# Patient Record
Sex: Female | Born: 1970
Health system: Southern US, Community
[De-identification: ages and names within clinical notes are randomized; demographics above are authoritative.]

## PROBLEM LIST (undated history)

## (undated) DIAGNOSIS — N189 Chronic kidney disease, unspecified: Secondary | ICD-10-CM

## (undated) DIAGNOSIS — Z87410 Personal history of cervical dysplasia: Secondary | ICD-10-CM

## (undated) DIAGNOSIS — J189 Pneumonia, unspecified organism: Secondary | ICD-10-CM

## (undated) DIAGNOSIS — E282 Polycystic ovarian syndrome: Secondary | ICD-10-CM

## (undated) DIAGNOSIS — M1990 Unspecified osteoarthritis, unspecified site: Secondary | ICD-10-CM

## (undated) DIAGNOSIS — M797 Fibromyalgia: Secondary | ICD-10-CM

## (undated) DIAGNOSIS — E785 Hyperlipidemia, unspecified: Secondary | ICD-10-CM

## (undated) DIAGNOSIS — F419 Anxiety disorder, unspecified: Secondary | ICD-10-CM

## (undated) DIAGNOSIS — G43909 Migraine, unspecified, not intractable, without status migrainosus: Secondary | ICD-10-CM

## (undated) DIAGNOSIS — I10 Essential (primary) hypertension: Secondary | ICD-10-CM

## (undated) DIAGNOSIS — Z87898 Personal history of other specified conditions: Secondary | ICD-10-CM

## (undated) DIAGNOSIS — R21 Rash and other nonspecific skin eruption: Secondary | ICD-10-CM

## (undated) DIAGNOSIS — F32A Depression, unspecified: Secondary | ICD-10-CM

## (undated) DIAGNOSIS — K219 Gastro-esophageal reflux disease without esophagitis: Secondary | ICD-10-CM

## (undated) DIAGNOSIS — R000 Tachycardia, unspecified: Secondary | ICD-10-CM

## (undated) DIAGNOSIS — F319 Bipolar disorder, unspecified: Secondary | ICD-10-CM

## (undated) DIAGNOSIS — G4730 Sleep apnea, unspecified: Secondary | ICD-10-CM

## (undated) DIAGNOSIS — N201 Calculus of ureter: Secondary | ICD-10-CM

## (undated) DIAGNOSIS — F329 Major depressive disorder, single episode, unspecified: Secondary | ICD-10-CM

## (undated) DIAGNOSIS — Z87442 Personal history of urinary calculi: Secondary | ICD-10-CM

## (undated) HISTORY — DX: Hyperlipidemia, unspecified: E78.5

## (undated) HISTORY — DX: Migraine, unspecified, not intractable, without status migrainosus: G43.909

## (undated) HISTORY — PX: BREAST EXCISIONAL BIOPSY: SUR124

## (undated) HISTORY — DX: Depression, unspecified: F32.A

## (undated) HISTORY — DX: Major depressive disorder, single episode, unspecified: F32.9

## (undated) HISTORY — DX: Gastro-esophageal reflux disease without esophagitis: K21.9

## (undated) HISTORY — PX: WISDOM TOOTH EXTRACTION: SHX21

## (undated) HISTORY — PX: COLONOSCOPY: SHX174

## (undated) HISTORY — DX: Polycystic ovarian syndrome: E28.2

---

## 1998-12-02 ENCOUNTER — Other Ambulatory Visit: Admission: RE | Admit: 1998-12-02 | Discharge: 1998-12-02 | Payer: Self-pay | Admitting: Obstetrics and Gynecology

## 1999-07-20 ENCOUNTER — Ambulatory Visit (HOSPITAL_COMMUNITY): Admission: RE | Admit: 1999-07-20 | Discharge: 1999-07-20 | Payer: Self-pay | Admitting: Obstetrics and Gynecology

## 1999-09-18 ENCOUNTER — Emergency Department (HOSPITAL_COMMUNITY): Admission: EM | Admit: 1999-09-18 | Discharge: 1999-09-18 | Payer: Self-pay | Admitting: Emergency Medicine

## 1999-09-27 ENCOUNTER — Ambulatory Visit (HOSPITAL_BASED_OUTPATIENT_CLINIC_OR_DEPARTMENT_OTHER): Admission: RE | Admit: 1999-09-27 | Discharge: 1999-09-27 | Payer: Self-pay | Admitting: Orthopedic Surgery

## 1999-09-27 HISTORY — PX: OTHER SURGICAL HISTORY: SHX169

## 2000-02-01 ENCOUNTER — Other Ambulatory Visit: Admission: RE | Admit: 2000-02-01 | Discharge: 2000-02-16 | Payer: Self-pay

## 2000-09-12 ENCOUNTER — Ambulatory Visit (HOSPITAL_COMMUNITY): Admission: RE | Admit: 2000-09-12 | Discharge: 2000-09-12 | Payer: Self-pay | Admitting: Obstetrics and Gynecology

## 2000-09-12 ENCOUNTER — Encounter: Payer: Self-pay | Admitting: Obstetrics and Gynecology

## 2001-02-07 ENCOUNTER — Other Ambulatory Visit: Admission: RE | Admit: 2001-02-07 | Discharge: 2001-02-07 | Payer: Self-pay | Admitting: Obstetrics and Gynecology

## 2001-11-28 ENCOUNTER — Encounter: Payer: Self-pay | Admitting: Internal Medicine

## 2001-11-28 ENCOUNTER — Encounter: Admission: RE | Admit: 2001-11-28 | Discharge: 2001-11-28 | Payer: Self-pay | Admitting: Internal Medicine

## 2001-11-29 ENCOUNTER — Encounter: Payer: Self-pay | Admitting: Internal Medicine

## 2001-11-29 ENCOUNTER — Encounter: Admission: RE | Admit: 2001-11-29 | Discharge: 2001-11-29 | Payer: Self-pay | Admitting: Internal Medicine

## 2002-05-20 ENCOUNTER — Other Ambulatory Visit: Admission: RE | Admit: 2002-05-20 | Discharge: 2002-05-20 | Payer: Self-pay | Admitting: Obstetrics and Gynecology

## 2002-06-25 ENCOUNTER — Encounter (INDEPENDENT_AMBULATORY_CARE_PROVIDER_SITE_OTHER): Payer: Self-pay | Admitting: *Deleted

## 2002-06-25 ENCOUNTER — Ambulatory Visit (HOSPITAL_COMMUNITY): Admission: RE | Admit: 2002-06-25 | Discharge: 2002-06-25 | Payer: Self-pay | Admitting: Obstetrics and Gynecology

## 2002-06-25 HISTORY — PX: OTHER SURGICAL HISTORY: SHX169

## 2002-09-24 ENCOUNTER — Other Ambulatory Visit: Admission: RE | Admit: 2002-09-24 | Discharge: 2002-09-24 | Payer: Self-pay | Admitting: Obstetrics and Gynecology

## 2002-10-01 ENCOUNTER — Encounter: Payer: Self-pay | Admitting: Obstetrics and Gynecology

## 2002-10-01 ENCOUNTER — Encounter: Admission: RE | Admit: 2002-10-01 | Discharge: 2002-10-01 | Payer: Self-pay | Admitting: Obstetrics and Gynecology

## 2002-10-15 ENCOUNTER — Encounter (INDEPENDENT_AMBULATORY_CARE_PROVIDER_SITE_OTHER): Payer: Self-pay | Admitting: *Deleted

## 2002-10-15 ENCOUNTER — Ambulatory Visit (HOSPITAL_BASED_OUTPATIENT_CLINIC_OR_DEPARTMENT_OTHER): Admission: RE | Admit: 2002-10-15 | Discharge: 2002-10-15 | Payer: Self-pay | Admitting: Surgery

## 2002-12-24 ENCOUNTER — Other Ambulatory Visit: Admission: RE | Admit: 2002-12-24 | Discharge: 2002-12-24 | Payer: Self-pay | Admitting: Obstetrics and Gynecology

## 2002-12-29 ENCOUNTER — Ambulatory Visit (HOSPITAL_COMMUNITY): Admission: RE | Admit: 2002-12-29 | Discharge: 2002-12-29 | Payer: Self-pay | Admitting: Oral & Maxillofacial Surgery

## 2002-12-29 ENCOUNTER — Encounter: Payer: Self-pay | Admitting: Oral & Maxillofacial Surgery

## 2003-03-25 ENCOUNTER — Other Ambulatory Visit: Admission: RE | Admit: 2003-03-25 | Discharge: 2003-03-25 | Payer: Self-pay | Admitting: Obstetrics and Gynecology

## 2003-09-30 ENCOUNTER — Other Ambulatory Visit: Admission: RE | Admit: 2003-09-30 | Discharge: 2003-09-30 | Payer: Self-pay | Admitting: Obstetrics and Gynecology

## 2004-03-09 ENCOUNTER — Encounter: Admission: RE | Admit: 2004-03-09 | Discharge: 2004-04-12 | Payer: Self-pay | Admitting: Internal Medicine

## 2004-11-23 ENCOUNTER — Other Ambulatory Visit: Admission: RE | Admit: 2004-11-23 | Discharge: 2004-11-23 | Payer: Self-pay | Admitting: Obstetrics and Gynecology

## 2005-04-14 ENCOUNTER — Emergency Department (HOSPITAL_COMMUNITY): Admission: EM | Admit: 2005-04-14 | Discharge: 2005-04-14 | Payer: Self-pay | Admitting: Emergency Medicine

## 2005-04-24 ENCOUNTER — Ambulatory Visit: Payer: Self-pay | Admitting: Internal Medicine

## 2005-04-25 ENCOUNTER — Encounter (INDEPENDENT_AMBULATORY_CARE_PROVIDER_SITE_OTHER): Payer: Self-pay | Admitting: *Deleted

## 2005-04-25 ENCOUNTER — Ambulatory Visit: Payer: Self-pay | Admitting: Internal Medicine

## 2005-04-25 HISTORY — PX: ESOPHAGOGASTRODUODENOSCOPY: SHX1529

## 2005-04-26 ENCOUNTER — Ambulatory Visit: Payer: Self-pay | Admitting: Internal Medicine

## 2005-05-10 ENCOUNTER — Ambulatory Visit: Payer: Self-pay | Admitting: Internal Medicine

## 2005-10-11 ENCOUNTER — Encounter: Admission: RE | Admit: 2005-10-11 | Discharge: 2005-10-11 | Payer: Self-pay | Admitting: Obstetrics and Gynecology

## 2005-10-13 ENCOUNTER — Ambulatory Visit: Payer: Self-pay | Admitting: Internal Medicine

## 2005-10-19 ENCOUNTER — Ambulatory Visit: Payer: Self-pay | Admitting: Internal Medicine

## 2005-10-27 ENCOUNTER — Ambulatory Visit: Payer: Self-pay | Admitting: Internal Medicine

## 2005-12-22 ENCOUNTER — Ambulatory Visit: Payer: Self-pay | Admitting: Internal Medicine

## 2006-01-19 ENCOUNTER — Ambulatory Visit: Payer: Self-pay | Admitting: Internal Medicine

## 2006-02-19 ENCOUNTER — Ambulatory Visit: Payer: Self-pay | Admitting: Internal Medicine

## 2006-03-28 ENCOUNTER — Ambulatory Visit: Payer: Self-pay | Admitting: Internal Medicine

## 2006-08-03 ENCOUNTER — Other Ambulatory Visit: Admission: RE | Admit: 2006-08-03 | Discharge: 2006-08-03 | Payer: Self-pay | Admitting: Obstetrics and Gynecology

## 2006-11-08 ENCOUNTER — Ambulatory Visit: Payer: Self-pay | Admitting: Internal Medicine

## 2006-11-08 ENCOUNTER — Encounter: Payer: Self-pay | Admitting: Internal Medicine

## 2006-12-07 ENCOUNTER — Ambulatory Visit: Payer: Self-pay | Admitting: Internal Medicine

## 2007-01-22 ENCOUNTER — Ambulatory Visit: Payer: Self-pay | Admitting: Endocrinology

## 2007-01-23 ENCOUNTER — Ambulatory Visit: Payer: Self-pay

## 2007-03-26 ENCOUNTER — Ambulatory Visit (HOSPITAL_COMMUNITY): Admission: RE | Admit: 2007-03-26 | Discharge: 2007-03-27 | Payer: Self-pay | Admitting: Obstetrics and Gynecology

## 2007-03-29 ENCOUNTER — Other Ambulatory Visit: Admission: RE | Admit: 2007-03-29 | Discharge: 2007-03-29 | Payer: Self-pay | Admitting: Obstetrics and Gynecology

## 2007-06-24 ENCOUNTER — Ambulatory Visit (HOSPITAL_COMMUNITY): Admission: RE | Admit: 2007-06-24 | Discharge: 2007-06-24 | Payer: Self-pay | Admitting: Obstetrics and Gynecology

## 2007-06-26 ENCOUNTER — Ambulatory Visit (HOSPITAL_COMMUNITY): Admission: RE | Admit: 2007-06-26 | Discharge: 2007-06-26 | Payer: Self-pay | Admitting: Obstetrics and Gynecology

## 2007-07-15 ENCOUNTER — Ambulatory Visit (HOSPITAL_COMMUNITY): Admission: RE | Admit: 2007-07-15 | Discharge: 2007-07-15 | Payer: Self-pay | Admitting: Obstetrics and Gynecology

## 2007-07-24 ENCOUNTER — Encounter: Payer: Self-pay | Admitting: Obstetrics and Gynecology

## 2007-07-24 ENCOUNTER — Inpatient Hospital Stay (HOSPITAL_COMMUNITY): Admission: AD | Admit: 2007-07-24 | Discharge: 2007-07-24 | Payer: Self-pay | Admitting: Obstetrics and Gynecology

## 2007-08-01 ENCOUNTER — Inpatient Hospital Stay (HOSPITAL_COMMUNITY): Admission: AD | Admit: 2007-08-01 | Discharge: 2007-08-01 | Payer: Self-pay | Admitting: Obstetrics and Gynecology

## 2007-08-01 ENCOUNTER — Encounter: Payer: Self-pay | Admitting: Obstetrics and Gynecology

## 2007-08-08 ENCOUNTER — Ambulatory Visit (HOSPITAL_COMMUNITY): Admission: RE | Admit: 2007-08-08 | Discharge: 2007-08-08 | Payer: Self-pay | Admitting: Obstetrics and Gynecology

## 2007-08-14 ENCOUNTER — Encounter: Payer: Self-pay | Admitting: *Deleted

## 2007-08-14 DIAGNOSIS — F3289 Other specified depressive episodes: Secondary | ICD-10-CM | POA: Insufficient documentation

## 2007-08-14 DIAGNOSIS — E282 Polycystic ovarian syndrome: Secondary | ICD-10-CM | POA: Insufficient documentation

## 2007-08-14 DIAGNOSIS — K219 Gastro-esophageal reflux disease without esophagitis: Secondary | ICD-10-CM | POA: Insufficient documentation

## 2007-08-14 DIAGNOSIS — A630 Anogenital (venereal) warts: Secondary | ICD-10-CM | POA: Insufficient documentation

## 2007-08-14 DIAGNOSIS — Z9889 Other specified postprocedural states: Secondary | ICD-10-CM | POA: Insufficient documentation

## 2007-08-14 DIAGNOSIS — Z87410 Personal history of cervical dysplasia: Secondary | ICD-10-CM | POA: Insufficient documentation

## 2007-08-14 DIAGNOSIS — F329 Major depressive disorder, single episode, unspecified: Secondary | ICD-10-CM | POA: Insufficient documentation

## 2007-08-15 ENCOUNTER — Ambulatory Visit (HOSPITAL_COMMUNITY): Admission: RE | Admit: 2007-08-15 | Discharge: 2007-08-15 | Payer: Self-pay | Admitting: Obstetrics and Gynecology

## 2007-08-22 ENCOUNTER — Ambulatory Visit (HOSPITAL_COMMUNITY): Admission: RE | Admit: 2007-08-22 | Discharge: 2007-08-22 | Payer: Self-pay | Admitting: Obstetrics and Gynecology

## 2007-08-23 ENCOUNTER — Ambulatory Visit (HOSPITAL_COMMUNITY): Admission: RE | Admit: 2007-08-23 | Discharge: 2007-08-23 | Payer: Self-pay | Admitting: Obstetrics and Gynecology

## 2007-12-09 ENCOUNTER — Ambulatory Visit: Payer: Self-pay | Admitting: Internal Medicine

## 2008-02-10 ENCOUNTER — Ambulatory Visit: Payer: Self-pay | Admitting: Internal Medicine

## 2008-02-10 ENCOUNTER — Telehealth: Payer: Self-pay | Admitting: Internal Medicine

## 2008-02-10 DIAGNOSIS — R599 Enlarged lymph nodes, unspecified: Secondary | ICD-10-CM | POA: Insufficient documentation

## 2008-02-21 ENCOUNTER — Encounter (INDEPENDENT_AMBULATORY_CARE_PROVIDER_SITE_OTHER): Payer: Self-pay | Admitting: *Deleted

## 2008-02-21 ENCOUNTER — Encounter: Payer: Self-pay | Admitting: Family Medicine

## 2008-02-21 LAB — CONVERTED CEMR LAB
ALT: 19 units/L
Alkaline Phosphatase: 66 units/L
CO2, serum: 18 mmol/L
Chloride, Serum: 98 mmol/L
Creatinine, Ser: 0.87 mg/dL
Globulin: 3.1 g/dL
Hemoglobin: 12.9 g/dL
MCH: 26.9 pg
Platelets: 385 10*3/uL
Potassium, serum: 4.3 mmol/L
RBC count: 4.79 10*6/uL
Total Bilirubin: 0.2 mg/dL
Total Protein: 7.3 g/dL

## 2008-02-25 ENCOUNTER — Ambulatory Visit (HOSPITAL_COMMUNITY): Admission: RE | Admit: 2008-02-25 | Discharge: 2008-02-25 | Payer: Self-pay | Admitting: Emergency Medicine

## 2008-02-25 ENCOUNTER — Emergency Department (HOSPITAL_COMMUNITY): Admission: EM | Admit: 2008-02-25 | Discharge: 2008-02-25 | Payer: Self-pay | Admitting: Emergency Medicine

## 2008-12-17 ENCOUNTER — Ambulatory Visit (HOSPITAL_COMMUNITY): Admission: RE | Admit: 2008-12-17 | Discharge: 2008-12-17 | Payer: Self-pay | Admitting: Obstetrics and Gynecology

## 2009-04-02 ENCOUNTER — Encounter (INDEPENDENT_AMBULATORY_CARE_PROVIDER_SITE_OTHER): Payer: Self-pay | Admitting: *Deleted

## 2009-04-02 ENCOUNTER — Ambulatory Visit: Payer: Self-pay | Admitting: Family Medicine

## 2009-04-02 ENCOUNTER — Observation Stay (HOSPITAL_COMMUNITY): Admission: EM | Admit: 2009-04-02 | Discharge: 2009-04-03 | Payer: Self-pay | Admitting: Family Medicine

## 2009-04-02 ENCOUNTER — Ambulatory Visit: Payer: Self-pay | Admitting: Internal Medicine

## 2009-04-02 ENCOUNTER — Ambulatory Visit: Payer: Self-pay | Admitting: Cardiology

## 2009-04-02 DIAGNOSIS — R079 Chest pain, unspecified: Secondary | ICD-10-CM | POA: Insufficient documentation

## 2009-04-26 ENCOUNTER — Telehealth (INDEPENDENT_AMBULATORY_CARE_PROVIDER_SITE_OTHER): Payer: Self-pay | Admitting: *Deleted

## 2009-04-27 ENCOUNTER — Ambulatory Visit: Payer: Self-pay

## 2009-04-27 HISTORY — PX: CARDIOVASCULAR STRESS TEST: SHX262

## 2009-04-28 ENCOUNTER — Encounter: Payer: Self-pay | Admitting: Cardiology

## 2009-05-06 ENCOUNTER — Ambulatory Visit: Payer: Self-pay | Admitting: Family Medicine

## 2009-05-06 DIAGNOSIS — R42 Dizziness and giddiness: Secondary | ICD-10-CM | POA: Insufficient documentation

## 2009-06-23 ENCOUNTER — Ambulatory Visit: Payer: Self-pay | Admitting: Family Medicine

## 2009-06-23 DIAGNOSIS — M25559 Pain in unspecified hip: Secondary | ICD-10-CM | POA: Insufficient documentation

## 2009-07-07 ENCOUNTER — Encounter (INDEPENDENT_AMBULATORY_CARE_PROVIDER_SITE_OTHER): Payer: Self-pay | Admitting: *Deleted

## 2009-07-07 DIAGNOSIS — E039 Hypothyroidism, unspecified: Secondary | ICD-10-CM | POA: Insufficient documentation

## 2009-07-07 DIAGNOSIS — E785 Hyperlipidemia, unspecified: Secondary | ICD-10-CM | POA: Insufficient documentation

## 2009-07-19 ENCOUNTER — Ambulatory Visit: Payer: Self-pay | Admitting: Family Medicine

## 2009-07-19 ENCOUNTER — Telehealth (INDEPENDENT_AMBULATORY_CARE_PROVIDER_SITE_OTHER): Payer: Self-pay | Admitting: *Deleted

## 2009-07-19 ENCOUNTER — Encounter (INDEPENDENT_AMBULATORY_CARE_PROVIDER_SITE_OTHER): Payer: Self-pay | Admitting: *Deleted

## 2009-07-19 DIAGNOSIS — R030 Elevated blood-pressure reading, without diagnosis of hypertension: Secondary | ICD-10-CM | POA: Insufficient documentation

## 2009-07-19 DIAGNOSIS — G43909 Migraine, unspecified, not intractable, without status migrainosus: Secondary | ICD-10-CM | POA: Insufficient documentation

## 2009-07-23 ENCOUNTER — Telehealth (INDEPENDENT_AMBULATORY_CARE_PROVIDER_SITE_OTHER): Payer: Self-pay | Admitting: *Deleted

## 2009-08-09 ENCOUNTER — Ambulatory Visit: Payer: Self-pay | Admitting: Family Medicine

## 2009-08-09 DIAGNOSIS — J309 Allergic rhinitis, unspecified: Secondary | ICD-10-CM | POA: Insufficient documentation

## 2009-08-09 DIAGNOSIS — H6690 Otitis media, unspecified, unspecified ear: Secondary | ICD-10-CM | POA: Insufficient documentation

## 2009-09-01 ENCOUNTER — Ambulatory Visit: Payer: Self-pay | Admitting: Family Medicine

## 2009-09-01 DIAGNOSIS — Z9989 Dependence on other enabling machines and devices: Secondary | ICD-10-CM | POA: Insufficient documentation

## 2009-09-01 DIAGNOSIS — I8390 Asymptomatic varicose veins of unspecified lower extremity: Secondary | ICD-10-CM | POA: Insufficient documentation

## 2009-09-01 DIAGNOSIS — G4733 Obstructive sleep apnea (adult) (pediatric): Secondary | ICD-10-CM | POA: Insufficient documentation

## 2009-09-02 LAB — CONVERTED CEMR LAB
ALT: 23 units/L (ref 0–35)
Alkaline Phosphatase: 69 units/L (ref 39–117)
BUN: 13 mg/dL (ref 6–23)
Bilirubin, Direct: 0 mg/dL (ref 0.0–0.3)
Calcium: 9.2 mg/dL (ref 8.4–10.5)
Cholesterol: 180 mg/dL (ref 0–200)
Creatinine, Ser: 0.8 mg/dL (ref 0.4–1.2)
Eosinophils Relative: 4.4 % (ref 0.0–5.0)
GFR calc non Af Amer: 86.29 mL/min (ref >60)
HDL: 42.6 mg/dL (ref >39.00)
LDL Cholesterol: 119 mg/dL — ABNORMAL HIGH (ref 0–99)
Lymphocytes Relative: 32.1 % (ref 12.0–46.0)
MCV: 84.1 fL (ref 78.0–100.0)
Monocytes Absolute: 0.5 10*3/uL (ref 0.1–1.0)
Monocytes Relative: 6.3 % (ref 3.0–12.0)
Neutrophils Relative %: 57.1 % (ref 43.0–77.0)
Platelets: 331 10*3/uL (ref 150.0–400.0)
Total Bilirubin: 0.6 mg/dL (ref 0.3–1.2)
Total CHOL/HDL Ratio: 4
Triglycerides: 94 mg/dL (ref 0.0–149.0)
VLDL: 18.8 mg/dL (ref 0.0–40.0)
WBC: 8.1 10*3/uL (ref 4.5–10.5)

## 2009-09-16 ENCOUNTER — Ambulatory Visit: Payer: Self-pay | Admitting: Pulmonary Disease

## 2009-09-17 ENCOUNTER — Encounter: Payer: Self-pay | Admitting: Pulmonary Disease

## 2009-09-17 ENCOUNTER — Ambulatory Visit (HOSPITAL_BASED_OUTPATIENT_CLINIC_OR_DEPARTMENT_OTHER): Admission: RE | Admit: 2009-09-17 | Discharge: 2009-09-17 | Payer: Self-pay | Admitting: Pulmonary Disease

## 2009-09-26 ENCOUNTER — Ambulatory Visit: Payer: Self-pay | Admitting: Pulmonary Disease

## 2009-09-30 ENCOUNTER — Ambulatory Visit (HOSPITAL_COMMUNITY): Admission: RE | Admit: 2009-09-30 | Discharge: 2009-09-30 | Payer: Self-pay | Admitting: Sports Medicine

## 2009-09-30 ENCOUNTER — Telehealth (INDEPENDENT_AMBULATORY_CARE_PROVIDER_SITE_OTHER): Payer: Self-pay | Admitting: *Deleted

## 2009-10-28 ENCOUNTER — Ambulatory Visit: Payer: Self-pay | Admitting: Diagnostic Radiology

## 2009-10-28 ENCOUNTER — Ambulatory Visit: Payer: Self-pay | Admitting: Family Medicine

## 2009-10-28 ENCOUNTER — Ambulatory Visit (HOSPITAL_BASED_OUTPATIENT_CLINIC_OR_DEPARTMENT_OTHER): Admission: RE | Admit: 2009-10-28 | Discharge: 2009-10-28 | Payer: Self-pay | Admitting: Family Medicine

## 2009-10-28 DIAGNOSIS — Z87448 Personal history of other diseases of urinary system: Secondary | ICD-10-CM | POA: Insufficient documentation

## 2009-10-28 DIAGNOSIS — R1011 Right upper quadrant pain: Secondary | ICD-10-CM | POA: Insufficient documentation

## 2009-10-28 LAB — CONVERTED CEMR LAB
Glucose, Urine, Semiquant: NEGATIVE
Protein, U semiquant: NEGATIVE
WBC Urine, dipstick: NEGATIVE
pH: 6

## 2009-10-29 LAB — CONVERTED CEMR LAB
Albumin: 4 g/dL (ref 3.5–5.2)
Basophils Relative: 0.2 % (ref 0.0–3.0)
CO2: 29 meq/L (ref 19–32)
Chloride: 102 meq/L (ref 96–112)
Creatinine, Ser: 0.8 mg/dL (ref 0.4–1.2)
Eosinophils Absolute: 0.3 10*3/uL (ref 0.0–0.7)
Hemoglobin: 13.8 g/dL (ref 12.0–15.0)
MCHC: 34.2 g/dL (ref 30.0–36.0)
MCV: 85.4 fL (ref 78.0–100.0)
Monocytes Absolute: 0.5 10*3/uL (ref 0.1–1.0)
Neutro Abs: 5.5 10*3/uL (ref 1.4–7.7)
RBC: 4.74 M/uL (ref 3.87–5.11)
Sodium: 137 meq/L (ref 135–145)
Total Protein: 7.5 g/dL (ref 6.0–8.3)

## 2009-12-07 ENCOUNTER — Telehealth: Payer: Self-pay | Admitting: Family Medicine

## 2010-01-07 ENCOUNTER — Ambulatory Visit: Payer: Self-pay | Admitting: Family Medicine

## 2010-01-10 ENCOUNTER — Encounter: Payer: Self-pay | Admitting: Family Medicine

## 2010-01-10 DIAGNOSIS — R002 Palpitations: Secondary | ICD-10-CM | POA: Insufficient documentation

## 2010-01-14 ENCOUNTER — Telehealth (INDEPENDENT_AMBULATORY_CARE_PROVIDER_SITE_OTHER): Payer: Self-pay | Admitting: *Deleted

## 2010-01-14 ENCOUNTER — Ambulatory Visit: Payer: Self-pay | Admitting: Family Medicine

## 2010-01-14 ENCOUNTER — Ambulatory Visit: Payer: Self-pay

## 2010-01-14 DIAGNOSIS — M791 Myalgia, unspecified site: Secondary | ICD-10-CM | POA: Insufficient documentation

## 2010-01-14 DIAGNOSIS — IMO0001 Reserved for inherently not codable concepts without codable children: Secondary | ICD-10-CM | POA: Insufficient documentation

## 2010-01-17 LAB — CONVERTED CEMR LAB
BUN: 9 mg/dL (ref 6–23)
Calcium: 9.4 mg/dL (ref 8.4–10.5)
Creatinine, Ser: 0.8 mg/dL (ref 0.4–1.2)
GFR calc non Af Amer: 86.11 mL/min (ref >60)
Potassium: 3.8 meq/L (ref 3.5–5.1)
Total CK: 347 units/L — ABNORMAL HIGH (ref 7–177)

## 2010-01-19 ENCOUNTER — Telehealth: Payer: Self-pay | Admitting: Family Medicine

## 2010-01-31 ENCOUNTER — Telehealth: Payer: Self-pay | Admitting: Family Medicine

## 2010-01-31 ENCOUNTER — Ambulatory Visit: Payer: Self-pay | Admitting: Family Medicine

## 2010-01-31 DIAGNOSIS — R259 Unspecified abnormal involuntary movements: Secondary | ICD-10-CM | POA: Insufficient documentation

## 2010-02-04 ENCOUNTER — Telehealth: Payer: Self-pay | Admitting: Family Medicine

## 2010-02-07 ENCOUNTER — Ambulatory Visit (HOSPITAL_COMMUNITY): Admission: RE | Admit: 2010-02-07 | Discharge: 2010-02-07 | Payer: Self-pay | Admitting: Family Medicine

## 2010-02-11 ENCOUNTER — Telehealth: Payer: Self-pay | Admitting: Family Medicine

## 2010-02-12 ENCOUNTER — Encounter: Payer: Self-pay | Admitting: Family Medicine

## 2010-02-18 ENCOUNTER — Telehealth (INDEPENDENT_AMBULATORY_CARE_PROVIDER_SITE_OTHER): Payer: Self-pay | Admitting: *Deleted

## 2010-02-18 ENCOUNTER — Telehealth: Payer: Self-pay | Admitting: Family Medicine

## 2010-03-01 ENCOUNTER — Ambulatory Visit: Payer: Self-pay | Admitting: Family Medicine

## 2010-03-02 ENCOUNTER — Encounter: Payer: Self-pay | Admitting: Family Medicine

## 2010-03-02 DIAGNOSIS — I498 Other specified cardiac arrhythmias: Secondary | ICD-10-CM | POA: Insufficient documentation

## 2010-03-03 ENCOUNTER — Telehealth (INDEPENDENT_AMBULATORY_CARE_PROVIDER_SITE_OTHER): Payer: Self-pay | Admitting: *Deleted

## 2010-03-04 ENCOUNTER — Ambulatory Visit (HOSPITAL_COMMUNITY): Admission: RE | Admit: 2010-03-04 | Discharge: 2010-03-04 | Payer: Self-pay | Admitting: Psychiatry

## 2010-03-08 ENCOUNTER — Encounter: Payer: Self-pay | Admitting: Family Medicine

## 2010-03-09 ENCOUNTER — Ambulatory Visit: Payer: Self-pay | Admitting: Family Medicine

## 2010-03-09 DIAGNOSIS — R748 Abnormal levels of other serum enzymes: Secondary | ICD-10-CM | POA: Insufficient documentation

## 2010-03-10 LAB — CONVERTED CEMR LAB
BUN: 7 mg/dL (ref 6–23)
Calcium: 8.9 mg/dL (ref 8.4–10.5)
Creatinine, Ser: 1 mg/dL (ref 0.4–1.2)
GFR calc non Af Amer: 66.51 mL/min (ref >60)
Potassium: 3.3 meq/L — ABNORMAL LOW (ref 3.5–5.1)
Total CK: 74 units/L (ref 7–177)

## 2010-03-14 ENCOUNTER — Ambulatory Visit: Payer: Self-pay | Admitting: Family Medicine

## 2010-03-14 LAB — CONVERTED CEMR LAB
Nitrite: POSITIVE
Specific Gravity, Urine: >=1.03
Urobilinogen, UA: 0.2

## 2010-03-15 ENCOUNTER — Telehealth (INDEPENDENT_AMBULATORY_CARE_PROVIDER_SITE_OTHER): Payer: Self-pay | Admitting: *Deleted

## 2010-03-16 ENCOUNTER — Ambulatory Visit: Payer: Self-pay | Admitting: *Deleted

## 2010-03-17 ENCOUNTER — Telehealth: Payer: Self-pay | Admitting: Family Medicine

## 2010-03-17 DIAGNOSIS — N200 Calculus of kidney: Secondary | ICD-10-CM | POA: Insufficient documentation

## 2010-03-21 ENCOUNTER — Telehealth (INDEPENDENT_AMBULATORY_CARE_PROVIDER_SITE_OTHER): Payer: Self-pay | Admitting: *Deleted

## 2010-03-29 ENCOUNTER — Telehealth: Payer: Self-pay | Admitting: Family Medicine

## 2010-03-29 ENCOUNTER — Ambulatory Visit: Payer: Self-pay | Admitting: Family Medicine

## 2010-03-29 DIAGNOSIS — N390 Urinary tract infection, site not specified: Secondary | ICD-10-CM | POA: Insufficient documentation

## 2010-03-30 ENCOUNTER — Ambulatory Visit: Payer: Self-pay | Admitting: Family Medicine

## 2010-03-30 ENCOUNTER — Ambulatory Visit: Payer: Self-pay | Admitting: *Deleted

## 2010-03-30 LAB — CONVERTED CEMR LAB
Protein, U semiquant: 30
Urobilinogen, UA: 0.2

## 2010-03-31 LAB — CONVERTED CEMR LAB

## 2010-04-06 ENCOUNTER — Telehealth: Payer: Self-pay | Admitting: Family Medicine

## 2010-04-06 ENCOUNTER — Ambulatory Visit: Payer: Self-pay | Admitting: *Deleted

## 2010-04-28 ENCOUNTER — Encounter: Payer: Self-pay | Admitting: Family Medicine

## 2010-05-10 ENCOUNTER — Ambulatory Visit: Payer: Self-pay | Admitting: Licensed Clinical Social Worker

## 2010-05-25 ENCOUNTER — Ambulatory Visit (HOSPITAL_COMMUNITY): Admission: RE | Admit: 2010-05-25 | Discharge: 2010-05-25 | Payer: Self-pay | Admitting: Psychiatry

## 2010-06-22 ENCOUNTER — Encounter: Payer: Self-pay | Admitting: Family Medicine

## 2010-07-01 ENCOUNTER — Ambulatory Visit: Payer: Self-pay | Admitting: Family Medicine

## 2010-07-01 LAB — CONVERTED CEMR LAB: Rapid Strep: NEGATIVE

## 2010-07-20 ENCOUNTER — Encounter (INDEPENDENT_AMBULATORY_CARE_PROVIDER_SITE_OTHER): Payer: Self-pay | Admitting: *Deleted

## 2010-08-03 ENCOUNTER — Encounter: Payer: Self-pay | Admitting: Family Medicine

## 2010-08-11 ENCOUNTER — Telehealth: Payer: Self-pay | Admitting: Family Medicine

## 2010-08-18 ENCOUNTER — Telehealth: Payer: Self-pay | Admitting: Family Medicine

## 2010-09-07 ENCOUNTER — Ambulatory Visit: Payer: Self-pay | Admitting: Family Medicine

## 2010-09-07 DIAGNOSIS — L659 Nonscarring hair loss, unspecified: Secondary | ICD-10-CM | POA: Insufficient documentation

## 2010-09-08 ENCOUNTER — Encounter: Payer: Self-pay | Admitting: Family Medicine

## 2010-09-08 LAB — CONVERTED CEMR LAB
Prolactin: 7.1 ng/mL
Testosterone: 10.08 ng/dL (ref 10.00–70.00)

## 2010-09-19 ENCOUNTER — Ambulatory Visit: Payer: Self-pay | Admitting: Family Medicine

## 2010-09-22 ENCOUNTER — Encounter: Payer: Self-pay | Admitting: Family Medicine

## 2010-10-05 ENCOUNTER — Ambulatory Visit: Payer: Self-pay | Admitting: Family Medicine

## 2010-11-04 ENCOUNTER — Telehealth: Payer: Self-pay | Admitting: Family Medicine

## 2010-11-04 ENCOUNTER — Ambulatory Visit (HOSPITAL_BASED_OUTPATIENT_CLINIC_OR_DEPARTMENT_OTHER)
Admission: RE | Admit: 2010-11-04 | Discharge: 2010-11-04 | Payer: Self-pay | Source: Home / Self Care | Attending: Family Medicine | Admitting: Family Medicine

## 2010-11-04 DIAGNOSIS — M79609 Pain in unspecified limb: Secondary | ICD-10-CM | POA: Insufficient documentation

## 2010-11-10 ENCOUNTER — Ambulatory Visit: Payer: Self-pay | Admitting: Internal Medicine

## 2010-11-10 ENCOUNTER — Encounter: Payer: Self-pay | Admitting: Internal Medicine

## 2010-11-10 DIAGNOSIS — Q078 Other specified congenital malformations of nervous system: Secondary | ICD-10-CM | POA: Insufficient documentation

## 2010-11-18 ENCOUNTER — Telehealth: Payer: Self-pay | Admitting: Internal Medicine

## 2010-11-18 ENCOUNTER — Encounter (INDEPENDENT_AMBULATORY_CARE_PROVIDER_SITE_OTHER): Payer: Self-pay | Admitting: *Deleted

## 2010-12-07 ENCOUNTER — Ambulatory Visit
Admission: RE | Admit: 2010-12-07 | Discharge: 2010-12-07 | Payer: Self-pay | Source: Home / Self Care | Attending: Family Medicine | Admitting: Family Medicine

## 2010-12-07 DIAGNOSIS — M5416 Radiculopathy, lumbar region: Secondary | ICD-10-CM | POA: Insufficient documentation

## 2010-12-07 DIAGNOSIS — M549 Dorsalgia, unspecified: Secondary | ICD-10-CM | POA: Insufficient documentation

## 2010-12-07 LAB — CONVERTED CEMR LAB
Blood in Urine, dipstick: NEGATIVE
Glucose, Urine, Semiquant: NEGATIVE
Nitrite: NEGATIVE
Protein, U semiquant: NEGATIVE
WBC Urine, dipstick: NEGATIVE

## 2010-12-11 ENCOUNTER — Encounter: Payer: Self-pay | Admitting: Family Medicine

## 2010-12-11 ENCOUNTER — Encounter: Payer: Self-pay | Admitting: Obstetrics and Gynecology

## 2010-12-18 LAB — CONVERTED CEMR LAB
ALT: 13 units/L
AST: 15 units/L
BUN: 10 mg/dL (ref 6–23)
CK-MB: 1 ng/mL (ref 0.3–4.0)
CO2, serum: 23 mmol/L
Calcium: 9.4 mg/dL
Cholesterol: 164 mg/dL
Eosinophils Relative: 4.5 % (ref 0.0–5.0)
GFR calc non Af Amer: 75.17 mL/min (ref >60)
Glucose, Bld: 73 mg/dL
HCT: 39.3 % (ref 36.0–46.0)
HCT: 41.2 %
Hemoglobin: 13.1 g/dL (ref 12.0–15.0)
Hemoglobin: 13.8 g/dL
Lymphs Abs: 2.1 10*3/uL (ref 0.7–4.0)
MCH: 30.5 pg
Monocytes Relative: 5.7 % (ref 3.0–12.0)
Platelets: 305 10*3/uL (ref 150.0–400.0)
Platelets: 352 10*3/uL
Potassium: 4 meq/L (ref 3.5–5.1)
Relative Index: 0 (ref 0.0–2.5)
Sodium, serum: 140 mmol/L
Sodium: 142 meq/L (ref 135–145)
T3, Free: 2.4 pg/mL (ref 2.3–4.2)
TSH: 1.2 microintl units/mL (ref 0.35–5.50)
TSH: 2.2 microintl units/mL
Total Protein: 7.2 g/dL
Triglycerides: 115 mg/dL
WBC: 7.8 10*3/uL (ref 4.5–10.5)

## 2010-12-20 NOTE — Miscellaneous (Signed)
Summary: Orders Update   Clinical Lists Changes  Problems: Added new problem of CPK, ABNORMAL (ICD-790.5) Orders: Added new Service order of Venipuncture (04540) - Signed Added new Test order of TLB-BMP (Basic Metabolic Panel-BMET) (80048-METABOL) - Signed Added new Test order of TLB-CK Total Only(Creatine Kinase/CPK) (82550-CK) - Signed

## 2010-12-20 NOTE — Progress Notes (Signed)
Summary: Refill   Phone Note Refill Request   Refills Requested: Medication #1:  PROZAC 40 MG CAPS 1 by mouth daily Last ov- 01/07/2010. [facility]. Army Fossa CMA  February 18, 2010 8:38 AM    Follow-up for Phone Call        #30  11 refills Follow-up by: Loreen Freud DO,  February 18, 2010 9:14 AM    Prescriptions: PROZAC 40 MG CAPS (FLUOXETINE HCL) 1 by mouth daily  #30 x 11   Entered by:   Army Fossa CMA   Authorized by:   Loreen Freud DO   Signed by:   Army Fossa CMA on 02/18/2010   Method used:   Electronically to        [facility]* (retail)       [address] [place], [place]  [address]       Ph: [phone] or [phone]       Fax: [phone]   RxID:   [id number]

## 2010-12-20 NOTE — Progress Notes (Signed)
  Phone Note Call from Patient   Caller: Patient Call For: [facility] Summary of Call: pt is requesting med for yeast infection Initial call taken by: Loreen Freud DO,  February 11, 2010 1:43 PM  Follow-up for Phone Call        diflucan sent to pharmacy Follow-up by: Loreen Freud DO,  February 11, 2010 1:43 PM    New/Updated Medications: FLUCONAZOLE 150 MG TABS (FLUCONAZOLE) 1 by mouth x1, May repeat in 3 days as needed Prescriptions: FLUCONAZOLE 150 MG TABS (FLUCONAZOLE) 1 by mouth x1, May repeat in 3 days as needed  #2 x 1   Entered and Authorized by:   Loreen Freud DO   Signed by:   Loreen Freud DO on 02/11/2010   Method used:   Electronically to        [facility]* (retail)       [address] [place], [place]  [address]       Ph: [phone] or [phone]       Fax: [phone]   RxID:   [id number]

## 2010-12-20 NOTE — Procedures (Signed)
Summary: [facility]   Imported By: Lanelle Bal 03/07/2010 14:32:42  _____________________________________________________________________  External Attachment:    Type:   Image     Comment:   External Document

## 2010-12-20 NOTE — Miscellaneous (Signed)
  Medications Added ABILIFY 2 MG TABS (ARIPIPRAZOLE) take one tablet daily       Clinical Lists Changes  Medications: Changed medication from * ABILIFY 2MG  1 by mouth qd to ABILIFY 2 MG TABS (ARIPIPRAZOLE) take one tablet daily

## 2010-12-20 NOTE — Letter (Signed)
Summary: [facility]   Imported By: Lanelle Bal 07/04/2010 14:22:20  _____________________________________________________________________  External Attachment:    Type:   Image     Comment:   External Document

## 2010-12-20 NOTE — Progress Notes (Signed)
Summary: refill  Phone Note Refill Request   Refills Requested: Medication #1:  ALPRAZOLAM 0.25 MG TABS as needed two times a day last ov- 01/07/10. Army Fossa CMA  January 19, 2010 8:58 AM    Follow-up for Phone Call        refill x1  ,  1 refill Follow-up by: Loreen Freud DO,  January 19, 2010 11:01 AM    New/Updated Medications: ALPRAZOLAM 0.25 MG TABS (ALPRAZOLAM) as needed two times a day Prescriptions: ALPRAZOLAM 0.25 MG TABS (ALPRAZOLAM) as needed two times a day  #60 x 1   Entered by:   Army Fossa CMA   Authorized by:   Loreen Freud DO   Signed by:   Army Fossa CMA on 01/19/2010   Method used:   Printed then faxed to ...       [facility]* (retail)       [address] [place], [place]  [address]       Ph: [phone] or [phone]       Fax: [phone]   RxID:   [id number]

## 2010-12-20 NOTE — Progress Notes (Signed)
Summary: advise  Phone Note Call from Patient   Caller: Patient Summary of Call: Pt states that she is still having the anxiety so thats why she originally took the xanax. She is taking the extra because of the shaking. I informed her that it is not going to help the shaking. She states that she is not havig any CP. Please advise. Army Fossa CMA  February 04, 2010 1:48 PM   Follow-up for Phone Call        klonopin 0.5 mg two times a day  #60  take it regularly  f/u ov 1-2 weeks Follow-up by: Loreen Freud DO,  February 04, 2010 2:26 PM  Additional Follow-up for Phone Call Additional follow up Details #1::        pt is aware. Army Fossa CMA  February 04, 2010 2:27 PM     New/Updated Medications: KLONOPIN 0.5 MG TABS (CLONAZEPAM) 1 by mouth  two times a day Prescriptions: KLONOPIN 0.5 MG TABS (CLONAZEPAM) 1 by mouth  two times a day  #60 x 0   Entered by:   Army Fossa CMA   Authorized by:   Loreen Freud DO   Signed by:   Army Fossa CMA on 02/04/2010   Method used:   Printed then faxed to ...       [facility]* (retail)       [address] [place], [place]  [address]       Ph: [phone] or [phone]       Fax: [phone]   RxID:   [id number]

## 2010-12-20 NOTE — Assessment & Plan Note (Signed)
Summary: cough/cbs   Vital Signs:  Patient profile:   40 year old female Weight:      179 pounds BMI:     27.52 O2 Sat:      99 % on Room air Temp:     98.5 degrees F oral Pulse rate:   96 / minute BP sitting:   120 / 80  (left arm)  Vitals Entered By: Doristine Devoid CMA (October 05, 2010 11:38 AM)  O2 Flow:  Room air CC: HA and cough x1 wk very productive pain w/ deep breaths    History of Present Illness: 40 yo woman here today for cough.  sxs started last weekend as dry cough.  now very productive of greenish/yellow, thick sputum.  also blowing similar out of nose.  + facial pain, throbbing HA.  no tooth pain.  no ear pain.  no fevers.  + sick contacts.  Current Medications (verified): 1)  Omeprazole 40 Mg Cpdr (Omeprazole) .... Take 1 Tablet By Mouth Once A Day 2)  Vicodin 5-500 Mg Tabs (Hydrocodone-Acetaminophen) .Marland Kitchen.. 1-2 Tabs By Mouth Q6 As Needed For Pain. 3)  Nasonex 50 Mcg/act Susp (Mometasone Furoate) .... 2 Sprays Each Nostril Once Daily 4)  Klonopin 0.5 Mg Tabs (Clonazepam) .Marland Kitchen.. 1 By Mouth  Two Times A Day 5)  Wellbutrin Xl 300 Mg Xr24h-Tab (Bupropion Hcl) .Marland Kitchen.. 1 By Mouth Once Daily 6)  Vicodin 5-500 Mg Tabs (Hydrocodone-Acetaminophen) .... Take 1-2 Tabs Every 6 Hrs As Needed For Pain 7)  Effexor Xr 150 Mg Cp24 (Venlafaxine Hcl) .Marland Kitchen.. 1 By Mouth Daily 8)  Abilify 2 Mg Tabs (Aripiprazole) .... Take One Tablet Daily 9)  Fluconazole 150 Mg Tabs (Fluconazole) .Marland Kitchen.. 1 By Mouth X 1, May Repeat 3 Days As Needed  Allergies (verified): 1)  ! Ibuprofen 2)  ! Asa 3)  ! * Ivp Dye  Review of Systems      See HPI  Physical Exam  General:  Well-developed,well-nourished,in no acute distress; alert,appropriate and cooperative throughout examination Head:  Normocephalic and atraumatic without obvious abnormalities. No apparent alopecia or balding.  mild TTP over maxillary sinuses Eyes:  no injxn or inflammation Ears:  R TM normal L TM dull, erythematous, poor landmarks Nose:   + congestion Mouth:  Oral mucosa and oropharynx without lesions or exudates.  Teeth in good repair. Neck:  No deformities, masses, or tenderness noted. Lungs:  Normal respiratory effort, chest expands symmetrically. Lungs are clear to auscultation, no crackles or wheezes.  hacking cough. Heart:  reg S1/S2, no M/R/G   Impression & Recommendations:  Problem # 1:  UNSPECIFIED OTITIS MEDIA (ICD-382.9) Assessment New pt w/ L OM.  start Amox.  will also treat early sinus infxn and possible bronchitis.  cough meds given.  reviewed supportive care and red flags that should prompt return.  Pt expresses understanding and is in agreement w/ this plan. Her updated medication list for this problem includes:    Amoxicillin 500 Mg Tabs (Amoxicillin) .Marland Kitchen... 2 tabs by mouth two times a day x10 days.  take w/ food.  Complete Medication List: 1)  Omeprazole 40 Mg Cpdr (Omeprazole) .... Take 1 tablet by mouth once a day 2)  Vicodin 5-500 Mg Tabs (Hydrocodone-acetaminophen) .Marland Kitchen.. 1-2 tabs by mouth q6 as needed for pain. 3)  Nasonex 50 Mcg/act Susp (Mometasone furoate) .... 2 sprays each nostril once daily 4)  Klonopin 0.5 Mg Tabs (Clonazepam) .Marland Kitchen.. 1 by mouth  two times a day 5)  Wellbutrin Xl 300 Mg Xr24h-tab (Bupropion  hcl) .... 1 by mouth once daily 6)  Vicodin 5-500 Mg Tabs (Hydrocodone-acetaminophen) .... Take 1-2 tabs every 6 hrs as needed for pain 7)  Effexor Xr 150 Mg Cp24 (Venlafaxine hcl) .Marland Kitchen.. 1 by mouth daily 8)  Abilify 2 Mg Tabs (Aripiprazole) .... Take one tablet daily 9)  Fluconazole 150 Mg Tabs (Fluconazole) .Marland Kitchen.. 1 by mouth x 1, may repeat 3 days as needed 10)  Amoxicillin 500 Mg Tabs (Amoxicillin) .... 2 tabs by mouth two times a day x10 days.  take w/ food. 11)  Tessalon 200 Mg Caps (Benzonatate) .... Take one capsule by mouth three times a day as needed for cough 12)  Cheratussin Ac 100-10 Mg/37ml Syrp (Guaifenesin-codeine) .Marland Kitchen.. 1-2 tsps q4-6 as needed for cough.  will cause  drowsiness.  Patient Instructions: 1)  You have a L ear infection and likely an early sinus infection 2)  Start the Amox as directed- take w/ food to avoid upset stomach 3)  Tessalon for day cough, codeine syrup for night 4)  Drink lots of fluid 5)  Mucinex to thin your congestion 6)  Tylenol for pain due to cough 7)  Call with any questions or concerns 8)  Hang in there!! Prescriptions: CHERATUSSIN AC 100-10 MG/5ML SYRP (GUAIFENESIN-CODEINE) 1-2 tsps Q4-6 as needed for cough.  will cause drowsiness.  #150 x 0   Entered and Authorized by:   Neena Rhymes MD   Signed by:   Neena Rhymes MD on 10/05/2010   Method used:   Print then Give to Patient   RxID:   [id number] TESSALON 200 MG CAPS (BENZONATATE) Take one capsule by mouth three times a day as needed for cough  #60 x 0   Entered and Authorized by:   Neena Rhymes MD   Signed by:   Neena Rhymes MD on 10/05/2010   Method used:   Electronically to        [facility]* (retail)       [address] [place], [place]  [address]       Ph: [phone] or [phone]       Fax: [phone]   RxID:   [id number] AMOXICILLIN 500 MG TABS (AMOXICILLIN) 2 tabs by mouth two times a day x10 days.  take w/ food.  #40 x 0   Entered and Authorized by:   Neena Rhymes MD   Signed by:   Neena Rhymes MD on 10/05/2010   Method used:   Electronically to        [facility]* (retail)       [address] [place], [place]  [address]       Ph: [phone] or [phone]       Fax: [phone]   RxID:   [id number]    Orders Added: 1)  Est. Patient Level III [[address]]

## 2010-12-20 NOTE — Consult Note (Signed)
Summary: [facility]  [facility]   Imported By: Lanelle Bal 05/13/2010 14:13:01  _____________________________________________________________________  External Attachment:    Type:   Image     Comment:   External Document

## 2010-12-20 NOTE — Progress Notes (Signed)
Summary: shaking/labs  Phone Note Call from Patient   Caller: Patient Summary of Call: Pt states that her shaking is getting worse, she states people are able to notice it. She says its even in her legs. She is taking all of her medications and its not helping it. Pt had labs on 01/17/10- Do you still want to repeat? Army Fossa CMA  January 31, 2010 8:36 AM   Follow-up for Phone Call        yes -- repeat  refer to neuro--- d/w pt--- pt has had shakes for >1 year but recently they have been worse---other people notice.  anxiety has been much better.   bp  118/80   p 80 Follow-up by: Loreen Freud DO,  January 31, 2010 9:21 AM  New Problems: RESTING TREMOR (ICD-781.0)   New Problems: RESTING TREMOR (ICD-781.0)

## 2010-12-20 NOTE — Assessment & Plan Note (Signed)
Summary: sore throat/cbs   Vital Signs:  Patient profile:   40 year old female Weight:      172 pounds Temp:     99.0 degrees F oral BP sitting:   132 / 76  (left arm)  Vitals Entered By: Almeta Monas CMA Duncan Dull) (July 01, 2010 1:18 PM)  CC: c/o sore throat x 3weeks that is worst at night   History of Present Illness: 40 yo woman here today for sore throat.  sxs started 3 weeks ago.  worsening.  subject high fevers.  pain w/ talking, swallowing.  worsens throughout the day- worse at night.  denies PND.  no known sick contacts.  hx of seasonal allergies- not taking anything.    Current Medications (verified): 1)  Omeprazole 40 Mg Cpdr (Omeprazole) .... Take 1 Tablet By Mouth Once A Day 2)  Vicodin 5-500 Mg Tabs (Hydrocodone-Acetaminophen) .Marland Kitchen.. 1-2 Tabs By Mouth Q6 As Needed For Pain. 3)  Nasonex 50 Mcg/act Susp (Mometasone Furoate) .... 2 Sprays Each Nostril Once Daily 4)  Klonopin 0.5 Mg Tabs (Clonazepam) .Marland Kitchen.. 1 By Mouth  Two Times A Day 5)  Wellbutrin Xl 300 Mg Xr24h-Tab (Bupropion Hcl) .Marland Kitchen.. 1 By Mouth Once Daily 6)  Vicodin Es 7.5-750 Mg Tabs (Hydrocodone-Acetaminophen) .Marland Kitchen.. 1 By Mouth Every 6 Hours As Needed 7)  Effexor Xr 150 Mg Cp24 (Venlafaxine Hcl) .Marland Kitchen.. 1 By Mouth Daily 8)  Abilify 2mg  .... 1 By Mouth Qd 9)  Cipro 500 Mg .Marland Kitchen.. 1 By Mouth Two Times A Day X 10 Day  Allergies (verified): 1)  ! Ibuprofen 2)  ! Asa 3)  ! * Ivp Dye  Review of Systems      See HPI  Physical Exam  General:  Well-developed,well-nourished,in no acute distress; alert,appropriate and cooperative throughout examination Head:  Normocephalic and atraumatic without obvious abnormalities. No apparent alopecia or balding.  no TTP over sinuses Eyes:  no injxn or inflammation Ears:  External ear exam shows no significant lesions or deformities.  Otoscopic examination reveals clear canals, tympanic membranes are intact bilaterally without bulging, retraction, inflammation or discharge. Hearing is  grossly normal bilaterally. Nose:  External nasal examination shows no deformity or inflammation. Nasal mucosa are pink and moist without lesions or exudates. Mouth:  Oral mucosa and oropharynx without lesions or exudates.  Teeth in good repair.  + PND Neck:  No deformities, masses, or tenderness noted. Lungs:  Normal respiratory effort, chest expands symmetrically. Lungs are clear to auscultation, no crackles or wheezes. Heart:  reg S1/S2, no M/R/G   Impression & Recommendations:  Problem # 1:  PHARYNGITIS-ACUTE (ICD-462) Assessment New  most likely due to PND.  restart Nasonex.  add antihistamine.  reviewed supportive care and red flags that should prompt return.  Pt expresses understanding and is in agreement w/ this plan.  Orders: Rapid Strep (62694)  Problem # 2:  RHINITIS (ICD-477.9) Assessment: Deteriorated  start nasal steroid. Her updated medication list for this problem includes:    Nasonex 50 Mcg/act Susp (Mometasone furoate) .Marland Kitchen... 2 sprays each nostril once daily  Her updated medication list for this problem includes:    Nasonex 50 Mcg/act Susp (Mometasone furoate) .Marland Kitchen... 2 sprays each nostril once daily  Complete Medication List: 1)  Omeprazole 40 Mg Cpdr (Omeprazole) .... Take 1 tablet by mouth once a day 2)  Vicodin 5-500 Mg Tabs (Hydrocodone-acetaminophen) .Marland Kitchen.. 1-2 tabs by mouth q6 as needed for pain. 3)  Nasonex 50 Mcg/act Susp (Mometasone furoate) .... 2 sprays each nostril once  daily 4)  Klonopin 0.5 Mg Tabs (Clonazepam) .Marland Kitchen.. 1 by mouth  two times a day 5)  Wellbutrin Xl 300 Mg Xr24h-tab (Bupropion hcl) .Marland Kitchen.. 1 by mouth once daily 6)  Vicodin Es 7.5-750 Mg Tabs (Hydrocodone-acetaminophen) .Marland Kitchen.. 1 by mouth every 6 hours as needed 7)  Effexor Xr 150 Mg Cp24 (Venlafaxine hcl) .Marland Kitchen.. 1 by mouth daily 8)  Abilify 2mg   .... 1 by mouth qd 9)  Cipro 500 Mg  .Marland KitchenMarland Kitchen. 1 by mouth two times a day x 10 day  Patient Instructions: 1)  This appears to be related to your post nasal  drip 2)  Restart your Nasonex 3)  Add Claritin or Zyrtec daily 4)  Drink plenty of fluids 5)  Tylenol as needed 6)  Hang in there!! Prescriptions: EFFEXOR XR 150 MG CP24 (VENLAFAXINE HCL) 1 by mouth daily  #90 x 3   Entered by:   Almeta Monas CMA (AAMA)   Authorized by:   Neena Rhymes MD   Signed by:   Almeta Monas CMA (AAMA) on 07/01/2010   Method used:   Electronically to        [facility]* (retail)       [address] [place], [place]  [address]       Ph: [phone] or [phone]       Fax: [phone]   RxID:   [id number]  prescription sent in error- cancelled at pharmacy.  Almeta Monas CMA Duncan Dull)  July 01, 2010 1:43 PM. Laboratory Results    Other Tests  Rapid Strep: negative

## 2010-12-20 NOTE — Progress Notes (Signed)
Summary: UTI returns  Phone Note Call from Patient   Caller: Patient Summary of Call: Pt states she finished all of the ATB that was given to her, last night when urinating she experienced pain again. Pt would like advice on what to do now? Army Fossa CMA  Mar 29, 2010 8:05 AM   Follow-up for Phone Call        check UA  C &S  Follow-up by: Loreen Freud DO,  Mar 29, 2010 9:18 AM  Additional Follow-up for Phone Call Additional follow up Details #1::        Pt is aware. Army Fossa CMA  Mar 29, 2010 9:20 AM   New Problems: UTI (ICD-599.0)   New Problems: UTI (ICD-599.0)

## 2010-12-20 NOTE — Assessment & Plan Note (Signed)
Summary: LOSING LOTS OF HAIR//PH   Vital Signs:  Patient profile:   40 year old female Height:      67.75 inches Weight:      179 pounds Temp:     98.2 degrees F oral Pulse rate:   72 / minute BP sitting:   120 / 76  (left arm)  Vitals Entered By: Jeremy Johann CMA (September 07, 2010 1:03 PM) CC: hair loss in clumps x33months   History of Present Illness: 40 yo woman here today for hair loss.  sxs started 2 months.  progressing.  reports hair will come out in clumps in the shower and when brushing hair.  reports hair loss exceeds 100 hairs/day.  unsure if hair is noticeably missing.  denies hair breakage, 'the whole thing comes out'.  denies hx of something similar.  having normal periods, no new meds.  does have increased stress.  notes increased facial hair.  Current Medications (verified): 1)  Omeprazole 40 Mg Cpdr (Omeprazole) .... Take 1 Tablet By Mouth Once A Day 2)  Vicodin 5-500 Mg Tabs (Hydrocodone-Acetaminophen) .Marland Kitchen.. 1-2 Tabs By Mouth Q6 As Needed For Pain. 3)  Nasonex 50 Mcg/act Susp (Mometasone Furoate) .... 2 Sprays Each Nostril Once Daily 4)  Klonopin 0.5 Mg Tabs (Clonazepam) .Marland Kitchen.. 1 By Mouth  Two Times A Day 5)  Wellbutrin Xl 300 Mg Xr24h-Tab (Bupropion Hcl) .Marland Kitchen.. 1 By Mouth Once Daily 6)  Vicodin 5-500 Mg Tabs (Hydrocodone-Acetaminophen) .... Take 1-2 Tabs Every 6 Hrs As Needed For Pain 7)  Effexor Xr 150 Mg Cp24 (Venlafaxine Hcl) .Marland Kitchen.. 1 By Mouth Daily 8)  Abilify 2 Mg Tabs (Aripiprazole) .... Take One Tablet Daily 9)  Cipro 500 Mg .Marland Kitchen.. 1 By Mouth Two Times A Day X 10 Day 10)  Fluconazole 150 Mg Tabs (Fluconazole) .Marland Kitchen.. 1 By Mouth X 1, May Repeat 3 Days As Needed  Allergies (verified): 1)  ! Ibuprofen 2)  ! Asa 3)  ! * Ivp Dye  Review of Systems      See HPI  Physical Exam  General:  Well-developed,well-nourished,in no acute distress; alert,appropriate and cooperative throughout examination Head:  Normocephalic and atraumatic without obvious abnormalities. No  apparent alopecia or balding- part is not widening, no patchy alopecia Skin:  some increase in facial hair   Impression & Recommendations:  Problem # 1:  HAIR LOSS (ICD-704.00) Assessment New ? androgen excess given increased facial hair.  check labs.  if abnormal will refer to GYN or endo. Orders: Venipuncture (46962) TLB-TSH (Thyroid Stimulating Hormone) (84443-TSH) TLB-Testosterone, Total (84403-TESTO) T-Prolactin (95[address]-13244) Specimen Handling (01027)  Complete Medication List: 1)  Omeprazole 40 Mg Cpdr (Omeprazole) .... Take 1 tablet by mouth once a day 2)  Vicodin 5-500 Mg Tabs (Hydrocodone-acetaminophen) .Marland Kitchen.. 1-2 tabs by mouth q6 as needed for pain. 3)  Nasonex 50 Mcg/act Susp (Mometasone furoate) .... 2 sprays each nostril once daily 4)  Klonopin 0.5 Mg Tabs (Clonazepam) .Marland Kitchen.. 1 by mouth  two times a day 5)  Wellbutrin Xl 300 Mg Xr24h-tab (Bupropion hcl) .Marland Kitchen.. 1 by mouth once daily 6)  Vicodin 5-500 Mg Tabs (Hydrocodone-acetaminophen) .... Take 1-2 tabs every 6 hrs as needed for pain 7)  Effexor Xr 150 Mg Cp24 (Venlafaxine hcl) .Marland Kitchen.. 1 by mouth daily 8)  Abilify 2 Mg Tabs (Aripiprazole) .... Take one tablet daily 9)  Cipro 500 Mg  .Marland KitchenMarland Kitchen. 1 by mouth two times a day x 10 day 10)  Fluconazole 150 Mg Tabs (Fluconazole) .Marland Kitchen.. 1 by mouth x 1,  may repeat 3 days as needed  Patient Instructions: 1)  Try and avoid playing w/ your hair as this could increase hair loss 2)  Make sure you condition your hair regularly to avoid tangling 3)  We'll notify you of your lab results 4)  Hang in there!!!   Orders Added: 1)  Venipuncture [36415] 2)  TLB-TSH (Thyroid Stimulating Hormone) [84443-TSH] 3)  TLB-Testosterone, Total [84403-TESTO] 4)  T-Prolactin [09811-91478] 5)  Specimen Handling [99000] 6)  Est. Patient Level III [29562]

## 2010-12-20 NOTE — Progress Notes (Signed)
Summary: [facility] See A NURSE  Phone Note Outgoing Call   Summary of Call: [facility] Triage Call Report Triage Record Num: [id number] Operator: Estevan Oaks Patient Name: [name] [name] Call Date & Time: 03/18/2010 6:38:42PM Patient Phone: [phone] PCP: Lelon Perla Patient Gender: Female PCP Fax : Patient DOB: Jun 06, 1971 Practice Name: [facility] Reason for Call: Clarene Reamer Rx not received at pharmacy. [facility] [phone] [id number]. 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 Rph gave her 3 tabs. Rn adv'd will check with staff in AM to verify Rx and will recall her if able to call in. Protocol(s) Used: Office Note Recommended Outcome per Protocol: Information Noted and Sent to Office Reason for Outcome: Caller information to office Care Advice:  ~ 04/  Follow-up for Phone Call        PER PT MED CALL IN ON SAT AND PICK-UP. PT IS DOING WELL TODAY............Marland KitchenFelecia Deloach CMA  Mar 21, 2010 8:38 AM

## 2010-12-20 NOTE — Assessment & Plan Note (Signed)
Summary: anxiety/drb   Vital Signs:  Patient profile:   40 year old female Weight:      220 pounds Pulse rate:   112 / minute Pulse rhythm:   regular BP sitting:   124 / 82  (left arm) Cuff size:   large  Vitals Entered By: Army Fossa CMA (January 07, 2010 9:35 AM) CC: Pt c/o high anxiety x 2 weeks- unable to eat. , Depressive symptoms   History of Present Illness:  Depressive symptoms      This is a 39 year old woman who presents with Depressive symptoms.  The patient reports depressed mood, but denies loss of interest/pleasure, significant weight loss, significant weight gain, insomnia, hypersomnia, psychomotor agitation, and psychomotor retardation.  The patient also reports fatigue or loss of energy.  The patient denies feelings of worthlessness, diminished concentration, indecisiveness, thoughts of death, thoughts of suicide, suicidal intent, and suicidal plans.  The patient reports the following psychosocial stressors: recent traumatic event.  Patient's past history includes depression.  The patient denies abnormally elevated mood, abnormally irritable mood, decreased need for sleep, increased talkativeness, distractibility, flight of ideas, increased goal-directed activity, and inflated self-esteem/ grandiosity.    Current Medications (verified): 1)  Omeprazole 40 Mg Cpdr (Omeprazole) .... Take 1 Tablet By Mouth Once A Day 2)  Xanax 0.5 Mg  Tabs (Alprazolam) .... As Directed As Needed 3)  Vicodin 5-500 Mg Tabs (Hydrocodone-Acetaminophen) .Marland Kitchen.. 1-2 Tabs By Mouth Q6 As Needed For Pain. 4)  Nasonex 50 Mcg/act Susp (Mometasone Furoate) .... 2 Sprays Each Nostril Once Daily 5)  Provera 10 Mg Tabs (Medroxyprogesterone Acetate) .... Take One Tablet For 5 Days 6)  Prozac 40 Mg Caps (Fluoxetine Hcl) .Marland Kitchen.. 1 By Mouth Daily 7)  Abilify 2 Mg Tabs (Aripiprazole) .Marland Kitchen.. 1 By Mouth Once Daily  Allergies: 1)  ! Ibuprofen 2)  ! Asa 3)  ! * Ivp Dye  Past History:  Past medical, surgical,  family and social histories (including risk factors) reviewed for relevance to current acute and chronic problems.  Past Medical History: Reviewed history from 07/19/2009 and no changes required. Hx of VENEREAL WART (ICD-078.11) GERD (ICD-530.81) DEPRESSION (ICD-311) Hx of POLYCYSTIC OVARIES (ICD-256.4) normal stress test 03/2009 Hyperlipidemia Hypothyroidism migraines  Past Surgical History: Reviewed history from 02/10/2008 and no changes required. HX, PERSONAL, CERVICAL DYSPLASIA (ICD-V13.22) OVARIAN CYSTECTOMY, HX OF (ICD-V45.89) laporoscopy x 3 two for removal of scar tissue/adhesions Breast surgery - left for a papiloma; had a lump removed right breast- benign.  Family History: Reviewed history from 09/16/2009 and no changes required. father- 58; DM mother - 64; DM, HTN Neg-breast or colon cancer; early CAD Grandparents (both maternal and paternal w/ CAD) Family History Asthma Family History Emphysema  Tongue Cancer-grandmother  Social History: Reviewed history from 09/16/2009 and no changes required. married '97, difficult relationship, have separated multiple times 1 son -'51 work: Systems developer for [employer], [employer] Patient states former smoker, 1/2 PPD, quit 2006  Review of Systems      See HPI  Physical Exam  General:  Well-developed,well-nourished,in no acute distress; alert,appropriate and cooperative throughout examination Psych:  Oriented X3, normally interactive, good eye contact, and tearful.     Impression & Recommendations:  Problem # 1:  DEPRESSION (ICD-311)  The following medications were removed from the medication list:    Fluoxetine Hcl 20 Mg Tabs (Fluoxetine hcl) .Marland Kitchen... Take 1 tab by mouth daily Her updated medication list for this problem includes:    Xanax 0.5 Mg Tabs (Alprazolam) .Marland Kitchen... As directed  as needed    Prozac 40 Mg Caps (Fluoxetine hcl) .Marland Kitchen... 1 by mouth daily    abilify 2 mg once daily   Complete Medication List: 1)   Omeprazole 40 Mg Cpdr (Omeprazole) .... Take 1 tablet by mouth once a day 2)  Xanax 0.5 Mg Tabs (Alprazolam) .... As directed as needed 3)  Vicodin 5-500 Mg Tabs (Hydrocodone-acetaminophen) .Marland Kitchen.. 1-2 tabs by mouth q6 as needed for pain. 4)  Nasonex 50 Mcg/act Susp (Mometasone furoate) .... 2 sprays each nostril once daily 5)  Provera 10 Mg Tabs (Medroxyprogesterone acetate) .... Take one tablet for 5 days 6)  Prozac 40 Mg Caps (Fluoxetine hcl) .Marland Kitchen.. 1 by mouth daily 7)  Abilify 2 Mg Tabs (Aripiprazole) .Marland Kitchen.. 1 by mouth once daily  Appended Document: Orders Update  Pt had chest pain and palpatations Friday---HR high---EKG normal per Dr Drue Novel Pt had palpatations all weekend--- will check labs today and holter monitor   Clinical Lists Changes  Problems: Added new problem of PALPITATIONS (ICD-785.1) - Signed Added new problem of CHEST PAIN UNSPECIFIED (ICD-786.50) - Signed Orders: Added new Referral order of Cardiology Referral (Cardiology) - Signed Added new Service order of Venipuncture [phone]) - Signed Added new Test order of TLB-BMP (Basic Metabolic Panel-BMET) (80048-METABOL) - Signed Added new Test order of TLB-CBC Platelet - w/Differential (85025-CBCD) - Signed Added new Test order of TLB-TSH (Thyroid Stimulating Hormone) (84443-TSH) - Signed Added new Test order of TLB-Cardiac Panel ([id number]) - Signed Added new Test order of TLB-T3, Free (Triiodothyronine) (84481-T3FREE) - Signed Added new Test order of TLB-T4 (Thyrox), Free [phone]) - Signed Added new Test order of T-D-Dimer Fibrin Derivatives Quantitive [phone]) - Signed

## 2010-12-20 NOTE — Progress Notes (Signed)
Summary: Refill Request  Phone Note Refill Request Message from:  Pharmacy on [address] Fax #: [phone]  Refills Requested: Medication #1:  Fluoxetine 20mg  cap, take 2 capsules by mouth every morning!   Dosage confirmed as above?Dosage Confirmed   Supply Requested: 1 month   Last Refilled: 01/17/2010 Next Appointment Scheduled: none Initial call taken by: Harold Barban,  February 18, 2010 3:39 PM    Prescriptions: PROZAC 40 MG CAPS (FLUOXETINE HCL) 1 by mouth daily  #30 x 11   Entered by:   Army Fossa CMA   Authorized by:   Loreen Freud DO   Signed by:   Army Fossa CMA on 02/18/2010   Method used:   Electronically to        [facility]* (retail)       [address] [place], [place]  [address]       Ph: [phone] or [phone]       Fax: [phone]   RxID:   [id number]

## 2010-12-20 NOTE — Progress Notes (Signed)
  Phone Note Outgoing Call   Summary of Call: Per dr Laury Axon repeat CPK, and BMP today.   New Problems: MYALGIA (ICD-729.1)   New Problems: MYALGIA (ICD-729.1)

## 2010-12-20 NOTE — Progress Notes (Signed)
Summary: Pain meds  Phone Note Call from Patient   Caller: Patient Summary of Call: Dr. Laury Axon offered to call in rx for pain med earlier in the week for kidney stone & pt declined, but her back was hurting bad last night she was holding ice on it, and it is hurting today.  Will she still send pain med to [facility]??? Initial call taken by: Army Fossa CMA,  March 17, 2010 2:59 PM  Follow-up for Phone Call        vicodin es 1 by mouth every 6 hours as needed  #30  ---  refer to urology Follow-up by: Loreen Freud DO,  March 17, 2010 3:11 PM  New Problems: RENAL CALCULUS (ICD-592.0)   New Problems: RENAL CALCULUS (ICD-592.0) New/Updated Medications: VICODIN ES 7.5-750 MG TABS (HYDROCODONE-ACETAMINOPHEN) 1 by mouth every 6 hours as needed Prescriptions: VICODIN ES 7.5-750 MG TABS (HYDROCODONE-ACETAMINOPHEN) 1 by mouth every 6 hours as needed  #30 x 0   Entered by:   Army Fossa CMA   Authorized by:   Loreen Freud DO   Signed by:   Army Fossa CMA on 03/17/2010   Method used:   Printed then faxed to ...       [facility]* (retail)       [address] [place], [place]  [address]       Ph: [phone] or [phone]       Fax: [phone]   RxID:   [id number]

## 2010-12-20 NOTE — Progress Notes (Signed)
Summary: painful kidney stone  Phone Note Call from Patient   Caller: Patient Summary of Call: Pt states that she has seen [facility] for [facility] stone. Pt is still experiencing extreme pain in lower back and side. Pt states that she was told by the urologist that the position that the kidney stone were located they should not be causing her pain. Pt still complain of extreme to the point that she is taking 4 to 5  tylenol at a time with no relief from the pain. The Pt is requesting to know what other options she has to help deal with the pain since OTC med are not working. PT has pending appt on Oct 21 with urology to see if stone are able to be blasted.Marland Kitchen Pls advise..............Marland KitchenFelecia Deloach CMA  August 18, 2010 9:30 AM   Follow-up for Phone Call        ok for Vicodin 5/500, 1-2 tabs every 6 hrs as needed for pain.  STOP taking all the tylenol, it is not good for the liver.  she also needs to give a urine sample to r/o infection (unless she's already on antibiotics). Follow-up by: Neena Rhymes MD,  August 18, 2010 10:22 AM  Additional Follow-up for Phone Call Additional follow up Details #1::        pt states that she is already on med for kidney infection but she has yet to pick up due to cost. Pt will pick-up and start all med Pt aware rx sent to pharmacy. Dr Beverely Low verbally informed.Marland KitchenMarland KitchenFelecia Deloach CMA  August 18, 2010 11:30 AM     New/Updated Medications: VICODIN 5-500 MG TABS (HYDROCODONE-ACETAMINOPHEN) Take 1-2 tabs every 6 hrs as needed for pain Prescriptions: VICODIN 5-500 MG TABS (HYDROCODONE-ACETAMINOPHEN) Take 1-2 tabs every 6 hrs as needed for pain  #60 x 0   Entered by:   Jeremy Johann CMA   Authorized by:   Neena Rhymes MD   Signed by:   Jeremy Johann CMA on 08/18/2010   Method used:   Printed then faxed to ...       [facility]* (retail)       [address] [place], [place]   [address]       Ph: [phone] or [phone]       Fax: [phone]   RxID:   [id number]

## 2010-12-20 NOTE — Letter (Signed)
Summary: [facility]  [facility]   Imported By: Lanelle Bal 08/12/2010 13:48:24  _____________________________________________________________________  External Attachment:    Type:   Image     Comment:   External Document

## 2010-12-20 NOTE — Progress Notes (Signed)
  Phone Note Call from Patient   Caller: Patient Call For: [place] Summary of Call: Pt has yeast infection--- just finished cipro requesting diflucan Initial call taken by: Loreen Freud DO,  August 11, 2010 5:03 PM  Follow-up for Phone Call        diflucan 150 mg  #2  1 by mouth x1 ,  may repeat in 3 days as needed  Follow-up by: Loreen Freud DO,  August 11, 2010 5:03 PM    New/Updated Medications: FLUCONAZOLE 150 MG TABS (FLUCONAZOLE) 1 by mouth x 1, may repeat 3 days as needed Prescriptions: FLUCONAZOLE 150 MG TABS (FLUCONAZOLE) 1 by mouth x 1, may repeat 3 days as needed  #2 x 2   Entered and Authorized by:   Loreen Freud DO   Signed by:   Loreen Freud DO on 08/11/2010   Method used:   Electronically to        [facility]* (retail)       [address] [place], [place]  [address]       Ph: [phone] or [phone]       Fax: [phone]   RxID:   [id number]

## 2010-12-20 NOTE — Progress Notes (Signed)
Summary: refill  Phone Note Refill Request Message from:  Fax from Pharmacy on Apr 06, 2010 10:46 AM  Refills Requested: Medication #1:  WELLBUTRIN XL 150 MG XR24H-TAB 1 by mouth qam  for 1 week then 2 by mouth once daily  Medication #2:  CITALOPRAM HBR 20MG  #30 FAX FROM [facility] [phone]   Method Requested: Fax to Local Pharmacy Initial call taken by: Okey Regal [place],  Apr 06, 2010 10:57 AM  Follow-up for Phone Call        Is she supposed to continue Clonazepam and discontiue Xanax or vise versa- she needs a refill on which every one she is to stay on. Army Fossa CMA  Apr 06, 2010 11:02 AM   Additional Follow-up for Phone Call Additional follow up Details #1::        con't klonopin rx sent to pharmacy Additional Follow-up by: Loreen Freud DO,  Apr 06, 2010 11:52 AM    New/Updated Medications: CELEXA 40 MG TABS (CITALOPRAM HYDROBROMIDE) 1 by mouth once daily WELLBUTRIN XL 300 MG XR24H-TAB (BUPROPION HCL) 1 by mouth once daily Prescriptions: WELLBUTRIN XL 300 MG XR24H-TAB (BUPROPION HCL) 1 by mouth once daily  #30 x 5   Entered and Authorized by:   Loreen Freud DO   Signed by:   Loreen Freud DO on 04/06/2010   Method used:   Electronically to        [facility]* (retail)       [address] [place], [place]  [address]       Ph: [phone] or [phone]       Fax: [phone]   RxID:   [id number] KLONOPIN 0.5 MG TABS (CLONAZEPAM) 1 by mouth  two times a day  #60 x 0   Entered and Authorized by:   Loreen Freud DO   Signed by:   Loreen Freud DO on 04/06/2010   Method used:   Print then Give to Patient   RxID:   [id number] CELEXA 40 MG TABS (CITALOPRAM HYDROBROMIDE) 1 by mouth once daily  #30 x 5   Entered and Authorized by:   Loreen Freud DO   Signed by:   Loreen Freud DO on 04/06/2010   Method used:   Electronically to        [facility]* (retail)       [address] [place], [place]  [address]       Ph: [phone] or [phone]       Fax: [phone]   RxID:   [id number]

## 2010-12-20 NOTE — Consult Note (Signed)
Summary: [facility]  [facility]   Imported By: Lanelle Bal 03/28/2010 13:24:20  _____________________________________________________________________  External Attachment:    Type:   Image     Comment:   External Document

## 2010-12-20 NOTE — Miscellaneous (Signed)
Summary: Orders Update   Clinical Lists Changes  Problems: Added new problem of SINUS TACHYCARDIA (ICD-427.89) Orders: Added new Referral order of Cardiology Referral (Cardiology) - Signed

## 2010-12-20 NOTE — Assessment & Plan Note (Signed)
Summary: cpx/lab/cbs   Vital Signs:  Patient profile:   40 year old female Height:      67.75 inches Weight:      179 pounds BMI:     27.52 Pulse rate:   86 / minute BP sitting:   120 / 78  (left arm)  Vitals Entered By: Doristine Devoid CMA (September 19, 2010 8:04 AM) CC: CPX AND LABS   History of Present Illness: 40 yo woman here today for CPE.  GYNSenaida [name], has appt next month.  no concerns about health.  Preventive Screening-Counseling & Management  Alcohol-Tobacco     Alcohol drinks/day: 1     Alcohol type: spirits     Smoking Status: current     Smoking Cessation Counseling: yes     Smoke Cessation Stage: contemplative     Packs/Day: <0.25  Caffeine-Diet-Exercise     Does Patient Exercise: no      Drug Use:  never.    Current Medications (verified): 1)  Omeprazole 40 Mg Cpdr (Omeprazole) .... Take 1 Tablet By Mouth Once A Day 2)  Vicodin 5-500 Mg Tabs (Hydrocodone-Acetaminophen) .Marland Kitchen.. 1-2 Tabs By Mouth Q6 As Needed For Pain. 3)  Nasonex 50 Mcg/act Susp (Mometasone Furoate) .... 2 Sprays Each Nostril Once Daily 4)  Klonopin 0.5 Mg Tabs (Clonazepam) .Marland Kitchen.. 1 By Mouth  Two Times A Day 5)  Wellbutrin Xl 300 Mg Xr24h-Tab (Bupropion Hcl) .Marland Kitchen.. 1 By Mouth Once Daily 6)  Vicodin 5-500 Mg Tabs (Hydrocodone-Acetaminophen) .... Take 1-2 Tabs Every 6 Hrs As Needed For Pain 7)  Effexor Xr 150 Mg Cp24 (Venlafaxine Hcl) .Marland Kitchen.. 1 By Mouth Daily 8)  Abilify 2 Mg Tabs (Aripiprazole) .... Take One Tablet Daily 9)  Fluconazole 150 Mg Tabs (Fluconazole) .Marland Kitchen.. 1 By Mouth X 1, May Repeat 3 Days As Needed  Allergies (verified): 1)  ! Ibuprofen 2)  ! Asa 3)  ! * Ivp Dye  Past History:  Past medical, surgical, family and social histories (including risk factors) reviewed, and no changes noted (except as noted below).  Past Medical History: Reviewed history from 07/19/2009 and no changes required. Hx of VENEREAL WART (ICD-078.11) GERD (ICD-530.81) DEPRESSION (ICD-311) Hx of POLYCYSTIC  OVARIES (ICD-256.4) normal stress test 03/2009 Hyperlipidemia Hypothyroidism migraines  Past Surgical History: Reviewed history from 02/10/2008 and no changes required. HX, PERSONAL, CERVICAL DYSPLASIA (ICD-V13.22) OVARIAN CYSTECTOMY, HX OF (ICD-V45.89) laporoscopy x 3 two for removal of scar tissue/adhesions Breast surgery - left for a papiloma; had a lump removed right breast- benign.  Family History: Reviewed history from 09/16/2009 and no changes required. father- 49; DM mother - 31; DM, HTN Neg-breast or colon cancer; early CAD Grandparents (both maternal and paternal w/ CAD) Family History Asthma Family History Emphysema  Tongue Cancer-grandmother  Social History: Reviewed history from 09/16/2009 and no changes required. 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  The patient denies anorexia, fever, weight loss, weight gain, vision loss, decreased hearing, hoarseness, syncope, dyspnea on exertion, peripheral edema, prolonged cough, headaches, abdominal pain, melena, hematochezia, severe indigestion/heartburn, hematuria, suspicious skin lesions, abnormal bleeding, enlarged lymph nodes, and breast masses.         Chest pain- has seen cards, w/u (-) Depression- well tx'd per pt  Physical Exam  General:  Well-developed,well-nourished,in no acute distress; alert,appropriate and cooperative throughout examination Head:  Normocephalic and atraumatic without obvious abnormalities. No apparent alopecia or balding Eyes:  No corneal or conjunctival inflammation noted. EOMI. Perrla. Funduscopic exam benign, without hemorrhages, exudates or papilledema. Vision grossly normal. Ears:  External ear exam shows no  significant lesions or deformities.  Otoscopic examination reveals clear canals, tympanic membranes are intact bilaterally without bulging, retraction, inflammation or discharge. Hearing is grossly normal bilaterally. Nose:  External nasal examination shows no deformity or inflammation. Nasal mucosa are pink and moist without lesions or exudates. Mouth:  Oral mucosa and oropharynx without lesions or exudates.  Teeth in good repair. Neck:  No deformities, masses, or tenderness noted. Breasts:  deferred to GYN Lungs:  Normal respiratory effort, chest expands symmetrically. Lungs are clear to auscultation, no crackles or wheezes. Heart:  reg S1/S2, no M/R/G Abdomen:  Bowel sounds positive,abdomen soft and non-tender without masses, organomegaly or hernias noted. Genitalia:  deferred to gyn Pulses:  +2 carotid, radial, DP/PT pulses Extremities:  No clubbing, cyanosis, edema, or deformity noted with normal full range of motion of all joints.  + LE vericosities Neurologic:  No cranial nerve deficits noted. Station and gait are normal. Plantar reflexes are down-going bilaterally. DTRs are symmetrical throughout. Sensory, motor and coordinative functions appear intact. Skin:  Intact without suspicious lesions or rashes Cervical Nodes:  No lymphadenopathy noted Axillary Nodes:  No palpable lymphadenopathy Psych:  Cognition and judgment appear intact. Alert and cooperative with normal attention span and concentration. No apparent delusions, illusions, hallucinations   Impression & Recommendations:  Problem # 1:  PHYSICAL EXAMINATION (ICD-V70.0) Assessment Unchanged pt's PE WNL.  recently had labs done for work.  will have pt bring them for review.  anticipatory guidance provided.  Complete Medication List: 1)  Omeprazole 40 Mg Cpdr (Omeprazole) .... Take 1 tablet by mouth once a day 2)  Vicodin 5-500 Mg Tabs (Hydrocodone-acetaminophen) .Marland Kitchen.. 1-2 tabs by mouth q6 as needed for pain. 3)  Nasonex 50  Mcg/act Susp (Mometasone furoate) .... 2 sprays each nostril once daily 4)  Klonopin 0.5 Mg Tabs (Clonazepam) .Marland Kitchen.. 1 by mouth  two times a day 5)  Wellbutrin Xl 300 Mg Xr24h-tab (Bupropion hcl) .Marland Kitchen.. 1 by mouth once daily 6)  Vicodin 5-500 Mg Tabs (Hydrocodone-acetaminophen) .... Take 1-2 tabs every 6 hrs as needed for pain 7)  Effexor Xr 150 Mg Cp24 (Venlafaxine hcl) .Marland Kitchen.. 1 by mouth daily 8)  Abilify 2 Mg Tabs (Aripiprazole) .... Take one tablet daily 9)  Fluconazole 150 Mg Tabs (Fluconazole) .Marland Kitchen.. 1 by mouth x 1, may repeat 3 days as needed  Patient Instructions: 1)  Your exam looks good! 2)  Print your labs and give me a copy to review 3)  Try and get regular exercise 4)  QUIT SMOKING! 5)  Call with any questions or concerns 6)  Have a great holiday season!!!   Orders Added: 1)  Est. Patient 18-39 years [[id number]]

## 2010-12-20 NOTE — Assessment & Plan Note (Signed)
Summary: follow up on meds/drb   Vital Signs:  Patient profile:   40 year old female Weight:      195 pounds Pulse rate:   99 / minute Pulse rhythm:   regular BP sitting:   130 / 84  (left arm) Cuff size:   large  Vitals Entered By: Army Fossa CMA (March 01, 2010 3:44 PM) CC: Pt is severly depressed, does not feel that any meds are working. She states she has random outbreaks of crying.    CC:  Pt is severly depressed and does not feel that any meds are working. She states she has random outbreaks of crying. Marland Kitchen  History of Present Illness: Pt here f/u depression----feels like the meds are not working---  she used to be on celexa and wellbutrin and did much better with that.  No other complaints.    Current Medications (verified): 1)  Omeprazole 40 Mg Cpdr (Omeprazole) .... Take 1 Tablet By Mouth Once A Day 2)  Alprazolam 0.25 Mg Tabs (Alprazolam) .... As Needed Two Times A Day 3)  Vicodin 5-500 Mg Tabs (Hydrocodone-Acetaminophen) .Marland Kitchen.. 1-2 Tabs By Mouth Q6 As Needed For Pain. 4)  Nasonex 50 Mcg/act Susp (Mometasone Furoate) .... 2 Sprays Each Nostril Once Daily 5)  Provera 10 Mg Tabs (Medroxyprogesterone Acetate) .... Take One Tablet For 5 Days 6)  Celexa 20 Mg Tabs (Citalopram Hydrobromide) .... 1/2 By Mouth Once Daily For 1 Week Then 1 By Mouth Once Daily For 1 Week Then 2 By Mouth Once Daily 7)  Toprol Xl 25 Mg Xr24h-Tab (Metoprolol Succinate) .Marland Kitchen.. 1 By Mouth Once Daily. 8)  Klonopin 0.5 Mg Tabs (Clonazepam) .Marland Kitchen.. 1 By Mouth  Two Times A Day 9)  Fluconazole 150 Mg Tabs (Fluconazole) .Marland Kitchen.. 1 By Mouth X1, May Repeat in 3 Days As Needed 10)  Wellbutrin Xl 150 Mg Xr24h-Tab (Bupropion Hcl) .Marland Kitchen.. 1 By Mouth Qam  For 1 Week Then 2 By Mouth Once Daily  Allergies: 1)  ! Ibuprofen 2)  ! Asa 3)  ! * Ivp Dye  Past History:  Past Medical History: Last updated: 07/19/2009 Hx of VENEREAL WART (ICD-078.11) GERD (ICD-530.81) DEPRESSION (ICD-311) Hx of POLYCYSTIC OVARIES  (ICD-256.4) normal stress test 03/2009 Hyperlipidemia Hypothyroidism migraines  Past Surgical History: Last updated: 02/10/2008 HX, PERSONAL, CERVICAL DYSPLASIA (ICD-V13.22) OVARIAN CYSTECTOMY, HX OF (ICD-V45.89) laporoscopy x 3 two for removal of scar tissue/adhesions Breast surgery - left for a papiloma; had a lump removed right breast- benign.  Family History: Last updated: 09/16/2009 father- 40; DM mother - 11; DM, HTN Neg-breast or colon cancer; early CAD Grandparents (both maternal and paternal w/ CAD) Family History Asthma Family History Emphysema  Tongue Cancer-grandmother  Social History: Last updated: 09/16/2009 married '97, difficult relationship, have separated multiple times 1 son -'62 work: Systems developer for [employer], Print production planner Patient states former smoker, 1/2 PPD, quit 2006  Risk Factors: Alcohol Use: 0 (09/01/2009) Exercise: no (09/01/2009)  Risk Factors: Smoking Status: quit (09/16/2009) Packs/Day: 0.75 (09/16/2009)  Family History: Reviewed history from 09/16/2009 and no changes required. father- 84; DM mother - 17; DM, HTN Neg-breast or colon cancer; early CAD Grandparents (both maternal and paternal w/ CAD) Family History Asthma Family History Emphysema  Tongue Cancer-grandmother  Social History: Reviewed history from 09/16/2009 and no changes required. married '97, difficult relationship, have separated multiple times 1 son -'10 work: Systems developer for [employer], [employer] Patient states former smoker, 1/2 PPD, quit 2006  Review of Systems      See HPI  Physical Exam  General:  Well-developed,well-nourished,in no acute distress; alert,appropriate and cooperative throughout examination Psych:  Oriented X3, normally interactive, good eye contact, and subdued.     Impression & Recommendations:  Problem # 1:  DEPRESSION (ICD-311)  Her updated medication list for this problem includes:    Alprazolam 0.25 Mg Tabs (Alprazolam) .Marland Kitchen... As  needed two times a day    Celexa 20 Mg Tabs (Citalopram hydrobromide) .Marland Kitchen... 1/2 by mouth once daily for 1 week then 1 by mouth once daily for 1 week then 2 by mouth once daily    Klonopin 0.5 Mg Tabs (Clonazepam) .Marland Kitchen... 1 by mouth  two times a day    Wellbutrin Xl 150 Mg Xr24h-tab (Bupropion hcl) .Marland Kitchen... 1 by mouth qam  for 1 week then 2 by mouth once daily  Discussed treatment options, including trial of antidpressant medication. Will refer to behavioral health. Follow-up call in in 24-48 hours and recheck in 2 weeks, sooner as needed. Patient agrees to call if any worsening of symptoms or thoughts of doing harm arise. Verified that the patient has no suicidal ideation at this time.   Complete Medication List: 1)  Omeprazole 40 Mg Cpdr (Omeprazole) .... Take 1 tablet by mouth once a day 2)  Alprazolam 0.25 Mg Tabs (Alprazolam) .... As needed two times a day 3)  Vicodin 5-500 Mg Tabs (Hydrocodone-acetaminophen) .Marland Kitchen.. 1-2 tabs by mouth q6 as needed for pain. 4)  Nasonex 50 Mcg/act Susp (Mometasone furoate) .... 2 sprays each nostril once daily 5)  Provera 10 Mg Tabs (Medroxyprogesterone acetate) .... Take one tablet for 5 days 6)  Celexa 20 Mg Tabs (Citalopram hydrobromide) .... 1/2 by mouth once daily for 1 week then 1 by mouth once daily for 1 week then 2 by mouth once daily 7)  Toprol Xl 25 Mg Xr24h-tab (Metoprolol succinate) .Marland Kitchen.. 1 by mouth once daily. 8)  Klonopin 0.5 Mg Tabs (Clonazepam) .Marland Kitchen.. 1 by mouth  two times a day 9)  Fluconazole 150 Mg Tabs (Fluconazole) .Marland Kitchen.. 1 by mouth x1, may repeat in 3 days as needed 10)  Wellbutrin Xl 150 Mg Xr24h-tab (Bupropion hcl) .Marland Kitchen.. 1 by mouth qam  for 1 week then 2 by mouth once daily Prescriptions: WELLBUTRIN XL 150 MG XR24H-TAB (BUPROPION HCL) 1 by mouth qam  for 1 week then 2 by mouth once daily  #60 x 0   Entered and Authorized by:   Loreen Freud DO   Signed by:   Loreen Freud DO on 03/01/2010   Method used:   Electronically to        [facility]* (retail)       [address] [place], [place]  [address]       Ph: [phone] or [phone]       Fax: [phone]   RxID:   [id number] CELEXA 20 MG TABS (CITALOPRAM HYDROBROMIDE) 1/2 by mouth once daily for 1 week then 1 by mouth once daily for 1 week then 2 by mouth once daily  #30 x 0   Entered and Authorized by:   Loreen Freud DO   Signed by:   Loreen Freud DO on 03/01/2010   Method used:   Electronically to        [facility]* (retail)       [address]  [place], [place]  [address]       Ph: [phone] or [phone]       Fax: [phone]   RxID:   [id number]

## 2010-12-20 NOTE — Progress Notes (Signed)
Summary: Vicodin refill  Phone Note Refill Request   Refills Requested: Medication #1:  VICODIN 5-500 MG TABS 1-2 tabs by mouth Q6 as needed for pain.   Last Refilled: 09/30/2009 last ov- 10/2009   Method Requested: Pick up at Office Initial call taken by: Army Fossa CMA,  December 07, 2009 1:13 PM  Follow-up for Phone Call        ok to refill x1 Follow-up by: Loreen Freud DO,  December 07, 2009 5:08 PM    Prescriptions: VICODIN 5-500 MG TABS (HYDROCODONE-ACETAMINOPHEN) 1-2 tabs by mouth Q6 as needed for pain.  #45 x 0   Entered by:   Army Fossa CMA   Authorized by:   Loreen Freud DO   Signed by:   Army Fossa CMA on 12/08/2009   Method used:   Print then Give to Patient   RxID:   [id number]

## 2010-12-20 NOTE — Progress Notes (Signed)
  Phone Note Call from Patient   Summary of Call: Per Dr.Lowne we got the Event monitor results and because she is on Toprol and fatigued she would like for Pt to be referred to cardiology.  Initial call taken by: Army Fossa CMA,  March 03, 2010 8:15 AM

## 2010-12-20 NOTE — Progress Notes (Signed)
Summary: snoring has worsened  Phone Note Call from Patient   Caller: Patient Call For: [facility] Summary of Call: pt states that she has lost weight- 30 + lbs since last seen by dr Vassie Loll for sleep. however, she states that she is "snoring more than ever". please advise. pt works at [employer] call her at [phone] Initial call taken by: Tivis Ringer, CNA,  March 15, 2010 2:37 PM  Follow-up for Phone Call        called and spoke with pt.  pt states she had a sleep study done previously and was told she had "mild sleep apnea" and was not treated with a cpap machine but to work on weight loss.  Pt states she has lost over 30lbs and staets she is feeling "worse" than before.  Pt c/o increased snoring and waking up tired.  Pt states it doesn't matter if she sleeps on her back or stomach, she snores either way.  Will forward message to RA to address.  Aundra Millet Reynolds LPN  March 15, 2010 3:50 PM   Additional Follow-up for Phone Call Additional follow up Details #1::        schedule OV to discuss please - positional therapy vs oral appliance Additional Follow-up by: Comer Locket. Vassie Loll MD,  March 18, 2010 10:43 AM    Additional Follow-up for Phone Call Additional follow up Details #2::    Spoke with pt.  She requests to be seen at [facility] office so sched her for next available there with is 04/07/10 at 3:30 pm. Follow-up by: Vernie Murders,  March 18, 2010 10:54 AM

## 2010-12-20 NOTE — Miscellaneous (Signed)
Summary: labs   Clinical Lists Changes  Observations: Added new observation of TSH: 2.2 microintl units/mL (09/08/2010 9:04) Added new observation of TRIGLYC TOT: 115 mg/dL (11/91/4782 9:56) Added new observation of LDL: 77 mg/dL (21/30/8657 8:46) Added new observation of HDL: 64 mg/dL (96/29/5284 1:32) Added new observation of CHOLESTEROL: 164 mg/dL (44/11/270 5:36) Added new observation of BILI TOTAL: 0.5 mg/dL (64/40/3474 2:59) Added new observation of ALK PHOS: 64 units/L (09/08/2010 9:04) Added new observation of SGPT (ALT): 13 units/L (09/08/2010 9:04) Added new observation of SGOT (AST): 15 units/L (09/08/2010 9:04) Added new observation of PROTEIN, TOT: 7.2 g/dL (56/38/7564 3:32) Added new observation of ALBUMIN: 4.2 g/dL (95/18/8416 6:06) Added new observation of CALCIUM: 9.4 mg/dL (30/16/0109 3:23) Added new observation of GLUCOSE SER: 73 mg/dL (55/73/2202 5:42) Added new observation of CREATININE: 1.0 mg/dL (70/62/3762 8:31) Added new observation of BUN: 15 mg/dL (51/76/1607 3:71) Added new observation of CO2 TOTAL: 23 mmol/L (09/08/2010 9:04) Added new observation of CHLORIDE: 103 mmol/L (09/08/2010 9:04) Added new observation of POTASSIUM: 4.6 mmol/L (09/08/2010 9:04) Added new observation of SODIUM: 140 mmol/L (09/08/2010 9:04) Added new observation of PLATELETS: 352 10*3/mm3 (09/08/2010 9:04) Added new observation of MCH: 30.5 pg (09/08/2010 9:04) Added new observation of MCV: 90.9 fL (09/08/2010 9:04) Added new observation of HCT: 41.2 % (09/08/2010 9:04) Added new observation of HGB: 13.8 g/dL (05/15/9484 4:62) Added new observation of RBC: 4.5 10*6/mm3 (09/08/2010 9:04) Added new observation of WBC: 8.0 10*3/mm3 (09/08/2010 9:04)

## 2010-12-20 NOTE — Progress Notes (Signed)
Summary: additonal lab test  Phone Note Call from Patient   Summary of Call: Pt is requesting testing for STD's becuase of the situation that she has been dealing with in her home life. Please advise. Army Fossa CMA  Mar 29, 2010 10:05 AM   Follow-up for Phone Call        order printed Follow-up by: Loreen Freud DO,  Mar 29, 2010 10:10 AM  New Problems: SEXUALLY TRANSMITTED DISEASE, EXPOSURE TO (ICD-V01.6)   New Problems: SEXUALLY TRANSMITTED DISEASE, EXPOSURE TO (ICD-V01.6)

## 2010-12-22 NOTE — Assessment & Plan Note (Signed)
Summary: kidneys hurting///sph   Vital Signs:  Patient profile:   40 year old female Weight:      177 pounds BMI:     27.21 Temp:     98.7 degrees F oral BP sitting:   130 / 80  (right arm)  Vitals Entered By: Doristine Devoid CMA (December 07, 2010 1:23 PM) CC: pain in bilateral flank area and hasn't had BM in 1 week    History of Present Illness: 40 yo woman here today for bilateral flank pain.  hx of bilateral kidney stones.  sees Urology.  was told that pain isn't from stones.  was referred to Dr Thurston Hole for muscle spasm but did not go.  pain will radiate from back, down flank, and into groin.  pain described as a throbbing pain.  will have associated nausea and HAs.  intolerant to NSAIDs.  on pain meds.  Current Medications (verified): 1)  Omeprazole 40 Mg Cpdr (Omeprazole) .... Take 1 Tablet By Mouth Once A Day 2)  Vicodin 5-500 Mg Tabs (Hydrocodone-Acetaminophen) .Marland Kitchen.. 1-2 Tabs By Mouth Q6 As Needed For Pain. 3)  Ativan 1 Mg Tabs (Lorazepam) .... Take 1-2 Tablets Daily As Needed 4)  Wellbutrin Xl 300 Mg Xr24h-Tab (Bupropion Hcl) .Marland Kitchen.. 1 By Mouth Once Daily 5)  Effexor Xr 150 Mg Cp24 (Venlafaxine Hcl) .Marland Kitchen.. 1 By Mouth Daily 6)  Abilify 5 Mg Tabs (Aripiprazole) .... Take One Tablet Daily  Allergies (verified): 1)  ! Ibuprofen 2)  ! Asa 3)  ! * Ivp Dye  Review of Systems      See HPI  Physical Exam  General:  Well-developed,well-nourished,in no acute distress; alert,appropriate and cooperative throughout examination Abdomen:  Bowel sounds positive,abdomen soft and non-tender without masses, organomegaly or hernias noted. Msk:  + TTP over R flank no pain w/ flexion or extension of back (-) SLR bilaterally Pulses:  +2 DP/PT Extremities:  no C/C/E Neurologic:  strength normal in all extremities, sensation intact to light touch, and gait normal.     Impression & Recommendations:  Problem # 1:  BACK PAIN (ICD-724.5) Assessment New  pain is likely unrelated to kidney stones  given normal UA.  pt's pain is in distribution of internal and transverse obliques.  radiating pain may be due to nerve compression.  will start muscle relaxer and pt to f/u w/ ortho as originally planned. Her updated medication list for this problem includes:    Vicodin 5-500 Mg Tabs (Hydrocodone-acetaminophen) .Marland Kitchen... 1-2 tabs by mouth q6 as needed for pain.    Cyclobenzaprine Hcl 10 Mg Tabs (Cyclobenzaprine hcl) .Marland Kitchen... 1 by mouth 2 times daily as needed for back pain.  will cause drowsiness  Orders: UA Dipstick w/o Micro (manual) (95638)  Complete Medication List: 1)  Omeprazole 40 Mg Cpdr (Omeprazole) .... Take 1 tablet by mouth once a day 2)  Vicodin 5-500 Mg Tabs (Hydrocodone-acetaminophen) .Marland Kitchen.. 1-2 tabs by mouth q6 as needed for pain. 3)  Ativan 1 Mg Tabs (Lorazepam) .... Take 1-2 tablets daily as needed 4)  Wellbutrin Xl 300 Mg Xr24h-tab (Bupropion hcl) .Marland Kitchen.. 1 by mouth once daily 5)  Effexor Xr 150 Mg Cp24 (Venlafaxine hcl) .Marland Kitchen.. 1 by mouth daily 6)  Abilify 5 Mg Tabs (Aripiprazole) .... Take one tablet daily 7)  Cyclobenzaprine Hcl 10 Mg Tabs (Cyclobenzaprine hcl) .Marland Kitchen.. 1 by mouth 2 times daily as needed for back pain.  will cause drowsiness  Patient Instructions: 1)  This appears to be a muscle spasm that is triggering nerve pain  2)  Start the cyclobenzaprine (flexeril) for pain relief 3)  Use a heating pad 4)  Reschedule an appt w/ Dr Thurston Hole 5)  There is no evidence of infection in your urine 6)  Call with any questions or concerns 7)  Hang in there!! Prescriptions: CYCLOBENZAPRINE HCL 10 MG  TABS (CYCLOBENZAPRINE HCL) 1 by mouth 2 times daily as needed for back pain.  will cause drowsiness  #30 x 0   Entered and Authorized by:   Neena Rhymes MD   Signed by:   Neena Rhymes MD on 12/07/2010   Method used:   Electronically to        [facility]* (retail)       [address] [place], [place]  [address]        Ph: [phone] or [phone]       Fax: [phone]   RxID:   [id number]    Orders Added: 1)  Est. Patient Level III [27253] 2)  UA Dipstick w/o Micro (manual) [81002]    Laboratory Results   Urine Tests    Routine Urinalysis   Glucose: negative   (Normal Range: Negative) Bilirubin: negative   (Normal Range: Negative) Ketone: negative   (Normal Range: Negative) Spec. Gravity: 1.015   (Normal Range: 1.003-1.035) Blood: negative   (Normal Range: Negative) pH: 6.0   (Normal Range: 5.0-8.0) Protein: negative   (Normal Range: Negative) Urobilinogen: 0.2   (Normal Range: 0-1) Nitrite: negative   (Normal Range: Negative) Leukocyte Esterace: negative   (Normal Range: Negative)    Urine HCG: negative

## 2010-12-22 NOTE — Progress Notes (Signed)
Summary: finger pain  Phone Note Call from Patient   Caller: Patient Summary of Call: pt calls indicating that L 1st finger is swollen and painful at PIP joint.  sxs started 1 month ago.  'it's excrutiating if someone bumps it'.  denies redness.  trouble bending it.  no other fingers or joints involved.  pt intolerant to NSAIDs.  will need xrays prior to proceeding. Initial call taken by: Neena Rhymes MD,  November 04, 2010 4:14 PM  New Problems: FINGER PAIN (ICD-729.5)   New Problems: FINGER PAIN (ICD-729.5)

## 2010-12-22 NOTE — Letter (Signed)
Summary: Work Writer, Main Office  [address]   [place], [place] [address]   Phone: [phone]  Fax: [phone]     November 18, 2010    [facility]   The above named patient had a medical visit December 22,2011at: 10:45 am.  Please take this into consideration when reviewing the time away from work/school.      Sincerely yours,  Architectural technologist

## 2010-12-22 NOTE — Assessment & Plan Note (Addendum)
Summary: dx:svt      Allergies Added:   Visit Type:  np Referring Provider:  pcp Primary Provider:  tabori  CC:  shortness of breath, CP, dizziness, and headaches.  History of Present Illness: [name] is seen at the request of Dr. Laury Axon or palpitations and dyspnea on exertion.  The patient is a 40 year old woman who is admitted to hospital at 18 months ago because of atypical chest pains which have continued. Evaluation included a negative Myoview scan. The symptoms are atypical in that they occur at rest last 20-30 minutes. It has not interfered with her "boot Camp" experience  About 6-8 months ago or for 6 months after the boot camp started she noted that her heart rate was fast. She noted palpitations at her desk. O2 sat probe identified are in 115 or so well at the cardiogram that I see the old record has a heart rate of about 95. I should note that the altered cardiogram from her hospitalization 18 months ago had a rate of 100. She was given a 30 day event recorder in February. The average heart rates most days was in the 70 range some days the average heart rate was over 100 and on a couple of days it is recorded as 115. tracings are consistent with sinus rhythm.  CBC and TSH at that time were normal  She also notes orthostatic intolerance. She has palpitations with exercise. Surprisingly she denies any shower and tolerance, heat intolerance, or aggravation of her symptoms around the time of her menses. Her diet is salt and fluid deplete  Problems Prior to Update: 1)  Finger Pain  (ICD-729.5) 2)  Unspecified Otitis Media  (ICD-382.9) 3)  Hair Loss  (ICD-704.00) 4)  Sexually Transmitted Disease, Exposure To  (ICD-V01.6) 5)  Uti  (ICD-599.0) 6)  Renal Calculus  (ICD-592.0) 7)  Cpk, Abnormal  (ICD-790.5) 8)  Sinus Tachycardia  (ICD-427.89) 9)  Resting Tremor  (ICD-781.0) 10)  Myalgia  (ICD-729.1) 11)  Chest Pain Unspecified  (ICD-786.50) 12)  Palpitations  (ICD-785.1) 13)   Abdominal Pain Right Upper Quadrant  (ICD-789.01) 14)  Hematuria, Hx of  (ICD-V13.09) 15)  Sleep Apnea  (ICD-780.57) 16)  Varicose Veins, Lower Extremities, Mild  (ICD-454.9) 17)  Physical Examination  (ICD-V70.0) 18)  Rhinitis  (ICD-477.9) 19)  Unspecified Otitis Media  (ICD-382.9) 20)  Elevated Bp w/o Hypertension  (ICD-796.2) 21)  Migraine  (ICD-346.90) 22)  Hyperlipidemia  (ICD-272.4) 23)  Hypothyroidism  (ICD-244.9) 24)  Hip Pain, Right  (ICD-719.45) 25)  Dizziness  (ICD-780.4) 26)  Chest Pain  (ICD-786.50) 27)  Lymph Node-enlarged  (ICD-785.6) 28)  Hx, Personal, Cervical Dysplasia  (ICD-V13.22) 29)  Ovarian Cystectomy, Hx of  (ICD-V45.89) 30)  Hx of Venereal Wart  (ICD-078.11) 31)  Gerd  (ICD-530.81) 32)  Depression  (ICD-311) 33)  Hx of Polycystic Ovaries  (ICD-256.4)  Current Medications (verified): 1)  Omeprazole 40 Mg Cpdr (Omeprazole) .... Take 1 Tablet By Mouth Once A Day 2)  Vicodin 5-500 Mg Tabs (Hydrocodone-Acetaminophen) .Marland Kitchen.. 1-2 Tabs By Mouth Q6 As Needed For Pain. 3)  Klonopin 0.5 Mg Tabs (Clonazepam) .Marland Kitchen.. 1 By Mouth  Two Times A Day 4)  Wellbutrin Xl 300 Mg Xr24h-Tab (Bupropion Hcl) .Marland Kitchen.. 1 By Mouth Once Daily 5)  Effexor Xr 150 Mg Cp24 (Venlafaxine Hcl) .Marland Kitchen.. 1 By Mouth Daily 6)  Abilify 2 Mg Tabs (Aripiprazole) .... Take One Tablet Daily 7)  Tessalon 200 Mg Caps (Benzonatate) .... Take One Capsule By Mouth Three Times A  Day As Needed For Cough 8)  Cheratussin Ac 100-10 Mg/69ml Syrp (Guaifenesin-Codeine) .Marland Kitchen.. 1-2 Tsps Q4-6 As Needed For Cough.  Will Cause Drowsiness.  Allergies (verified): 1)  ! Ibuprofen 2)  ! Asa 3)  ! * Ivp Dye  Past History:  Past Medical History: Last updated: 07/19/2009 Hx of VENEREAL WART (ICD-078.11) GERD (ICD-530.81) DEPRESSION (ICD-311) Hx of POLYCYSTIC OVARIES (ICD-256.4) normal stress test 03/2009 Hyperlipidemia Hypothyroidism migraines  Past Surgical History: Last updated: 02/10/2008 HX, PERSONAL, CERVICAL DYSPLASIA  (ICD-V13.22) OVARIAN CYSTECTOMY, HX OF (ICD-V45.89) laporoscopy x 3 two for removal of scar tissue/adhesions Breast surgery - left for a papiloma; had a lump removed right breast- benign.  Family History: Last updated: 09/16/2009 father- 35; DM mother - 17; DM, HTN Neg-breast or colon cancer; early CAD Grandparents (both maternal and paternal w/ CAD) Family History Asthma Family History Emphysema  Tongue Cancer-grandmother  Social History: Last updated: 09/19/2010 married '97, difficult relationship, have separated multiple times 1 son -'98 work: Systems developer for [employer], Print production planner Patient states former smoker, 1/2 PPD, quit 2006- restarted smoking 2011  Risk Factors: Alcohol Use: 1 (09/19/2010) Exercise: no (09/19/2010)  Risk Factors: Smoking Status: current (09/19/2010) Packs/Day: <0.25 (09/19/2010)  Review of Systems       full review of systems was negative apart from a history of present illness and past medical history.   Vital Signs:  Patient profile:   40 year old female Height:      67.75 inches Weight:      177.25 pounds BMI:     27.25 Pulse rate:   94 / minute Pulse (ortho):   97 / minute BP sitting:   107 / 77  (left arm) BP standing:   108 / 83 Cuff size:   regular  Vitals Entered By: Caralee Ates CMA (November 10, 2010 11:07 AM)  Serial Vital Signs/Assessments:  Time      Position  BP       Pulse  Resp  Temp     By           Lying RA  111/78   77                    Claris Gladden RN           Sitting   112/81   [address]                    Claris Gladden RN           Standing  108/83   97                    Claris Gladden RN           Standing  112/81   97                    Claris Gladden RN 3         Standing  115/81   106                   Claris Gladden RN 5         Standing  114/83   109                   Claris Gladden RN  Comments: initial lightheaded when changing from laying to sitting and from sitting to standing.  By: Claris Gladden RN  at 3 minutes standing pt c/o cp pointing mid  sternal. pain short duration. By: Caralee Ates CMA    Physical Exam  General:  Well developed, well nourished, younger Caucasian female appearing her stated agein no acute distress. Head:  normal HEENT Neck:  supple without thyromegaly Chest Wall:  without kyphosis scoliosis or CVA tenderness Lungs:  clear to auscultation Heart:  regular rate and rhythm without murmurs or gallops Abdomen:  soft nontender with active bowel sounds. There is no hepatomegaly or midline pulsation. Striae are noted   EKG  Procedure date:  11/10/2010  Findings:      sinus rhythm at 77 Intervals 0.1 46.0 x 6.40 Otherwise normal  Impression & Recommendations:  Problem # 1:  PALPITATIONS (ICD-785.1) the patient palpitations seem to be related to sinus rhythm. Review of her event recorder had some sinus tachycardia as noted previously. There were no P waves that suggested any other mechanism. I suspect that this all relates to dysautonomia (see below) Orders: EKG w/ Interpretation (93000)  Problem # 2:  DYSAUTONOMIA (ICD-742.8) the patient appears to have POTS-postural orthostatic tachycardia syndrome. 2 major issues arrive from this. The first is symptom control. Salt and water repletion is key. Beta blockers may be of some benefit. I've given the patient with the information from NDRF.org and POTS [employer]. I've also pointed out to her the important interaction between stress/anxiety/depression and dysautonomic symptoms and the importance of addressing the former.  Problem # 3:  CHEST PAIN UNSPECIFIED (ICD-786.50) E. cardiogram is normal. Her nuclear evaluation was normal. I suspect her chest pain is related to her dysautonomiaand that is not cardiac at all. Orders: EKG w/ Interpretation (93000)  Patient Instructions: 1)  Your physician recommends that you schedule a follow-up appointment in: as needed

## 2010-12-22 NOTE — Progress Notes (Signed)
Summary: Pt needs a Doc note faxed   Phone Note Call from Patient Call back at [phone]   Caller: Patient Summary of Call: Pt needs a doc note faxed to her regarding her office visit last week fax to  HYQ:MVHQI [phone] Initial call taken by: Judie Grieve,  November 18, 2010 8:29 AM  Follow-up for Phone Call        adv pt letter faxed.  Follow-up by: Claris Gladden RN,  November 18, 2010 9:14 AM

## 2011-02-14 ENCOUNTER — Encounter: Payer: Self-pay | Admitting: Family Medicine

## 2011-02-14 ENCOUNTER — Ambulatory Visit (INDEPENDENT_AMBULATORY_CARE_PROVIDER_SITE_OTHER): Payer: Self-pay | Admitting: Family Medicine

## 2011-02-14 DIAGNOSIS — R591 Generalized enlarged lymph nodes: Secondary | ICD-10-CM | POA: Insufficient documentation

## 2011-02-14 DIAGNOSIS — R5381 Other malaise: Secondary | ICD-10-CM

## 2011-02-14 DIAGNOSIS — R599 Enlarged lymph nodes, unspecified: Secondary | ICD-10-CM

## 2011-02-14 DIAGNOSIS — R5383 Other fatigue: Secondary | ICD-10-CM | POA: Insufficient documentation

## 2011-02-14 MED ORDER — CEPHALEXIN 500 MG PO CAPS: 500.0000 mg | ORAL_CAPSULE | Freq: Two times a day (BID) | ORAL | Status: AC

## 2011-02-14 NOTE — Assessment & Plan Note (Signed)
Check labs 

## 2011-02-14 NOTE — Assessment & Plan Note (Signed)
Check labs Keflex 500 mg 1 po bid for 10 days

## 2011-02-14 NOTE — Progress Notes (Signed)
  Subjective:    Patient ID: [name] [name], female    DOB: February 07, 1971, 40 y.o.   MRN: [id number]  HPI Pt here c/o of extreme fatigue and swelling in throat for 3 weeks.  No congestion.  Pt is sleeping well.      Review of Systems    see above Objective:   Physical Exam  Constitutional: She is oriented to person, place, and time. She appears well-developed and well-nourished.  HENT:  Head: Normocephalic and atraumatic.  Right Ear: External ear normal.  Left Ear: External ear normal.  Nose: Nose normal.  Mouth/Throat: Oropharynx is clear and moist. No oropharyngeal exudate.  Neck: Normal range of motion. Neck supple. No tracheal deviation present. No thyromegaly present.  Cardiovascular: Normal rate, regular rhythm and normal heart sounds.   Pulmonary/Chest: Effort normal and breath sounds normal.  Musculoskeletal: Normal range of motion.  Lymphadenopathy:    She has cervical adenopathy.  Neurological: She is alert and oriented to person, place, and time.  Psychiatric: She has a normal mood and affect.          Assessment & Plan:

## 2011-02-15 ENCOUNTER — Ambulatory Visit: Payer: Self-pay | Admitting: Family Medicine

## 2011-02-15 LAB — BASIC METABOLIC PANEL
BUN: 19 mg/dL (ref 6–23)
Creatinine, Ser: 0.8 mg/dL (ref 0.4–1.2)
GFR: 82.04 mL/min (ref >60.00)
Glucose, Bld: 136 mg/dL — ABNORMAL HIGH (ref 70–99)

## 2011-02-15 LAB — EPSTEIN-BARR VIRUS VCA ANTIBODY PANEL
EBV NA IgG: 2.82 {ISR} — ABNORMAL HIGH
EBV VCA IgM: 0.06 {ISR}

## 2011-02-15 LAB — TSH: TSH: 2.01 u[IU]/mL (ref 0.35–5.50)

## 2011-02-15 LAB — T4, FREE: Free T4: 0.62 ng/dL (ref 0.60–1.60)

## 2011-02-15 LAB — CBC WITH DIFFERENTIAL/PLATELET
HCT: 39.6 % (ref 36.0–46.0)
Hemoglobin: 13.6 g/dL (ref 12.0–15.0)
RBC: 4.49 Mil/uL (ref 3.87–5.11)
WBC: 4.2 10*3/uL — ABNORMAL LOW (ref 4.5–10.5)

## 2011-02-15 LAB — T3, FREE: T3, Free: 2.4 pg/mL (ref 2.3–4.2)

## 2011-02-16 NOTE — Progress Notes (Signed)
Letter given to patient in office...Marland KitchenMarland KitchenMarland Kitchen KP

## 2011-02-17 ENCOUNTER — Other Ambulatory Visit (INDEPENDENT_AMBULATORY_CARE_PROVIDER_SITE_OTHER): Payer: 59

## 2011-02-17 DIAGNOSIS — R7989 Other specified abnormal findings of blood chemistry: Secondary | ICD-10-CM

## 2011-02-17 LAB — BASIC METABOLIC PANEL
Calcium: 8.9 mg/dL (ref 8.4–10.5)
Creatinine, Ser: 1 mg/dL (ref 0.4–1.2)
GFR: 70.2 mL/min (ref >60.00)
Glucose, Bld: 87 mg/dL (ref 70–99)
Sodium: 138 mEq/L (ref 135–145)

## 2011-02-20 ENCOUNTER — Encounter: Payer: Self-pay | Admitting: *Deleted

## 2011-02-20 NOTE — Progress Notes (Signed)
Copy given to Patient

## 2011-02-28 LAB — COMPREHENSIVE METABOLIC PANEL
Albumin: 3.6 g/dL (ref 3.5–5.2)
Alkaline Phosphatase: 63 U/L (ref 39–117)
BUN: 11 mg/dL (ref 6–23)
CO2: 27 mEq/L (ref 19–32)
Chloride: 103 mEq/L (ref 96–112)
GFR calc non Af Amer: >60 mL/min (ref >60)
Glucose, Bld: 88 mg/dL (ref 70–99)
Potassium: 3.9 mEq/L (ref 3.5–5.1)
Total Bilirubin: 0.4 mg/dL (ref 0.3–1.2)

## 2011-02-28 LAB — CARDIAC PANEL(CRET KIN+CKTOT+MB+TROPI)
CK, MB: 0.7 ng/mL (ref 0.3–4.0)
Relative Index: INVALID (ref 0.0–2.5)
Total CK: 48 U/L (ref 7–177)
Troponin I: 0.01 ng/mL (ref 0.00–0.06)

## 2011-02-28 LAB — CBC
HCT: 39.7 % (ref 36.0–46.0)
Hemoglobin: 13.3 g/dL (ref 12.0–15.0)
Platelets: 300 10*3/uL (ref 150–400)
RBC: 4.73 MIL/uL (ref 3.87–5.11)
WBC: 10.6 10*3/uL — ABNORMAL HIGH (ref 4.0–10.5)

## 2011-02-28 LAB — LIPID PANEL
LDL Cholesterol: 111 mg/dL — ABNORMAL HIGH (ref 0–99)
Triglycerides: 111 mg/dL (ref <150)
VLDL: 22 mg/dL (ref 0–40)

## 2011-02-28 LAB — PROTIME-INR: Prothrombin Time: 13.1 seconds (ref 11.6–15.2)

## 2011-03-03 ENCOUNTER — Other Ambulatory Visit: Payer: Self-pay | Admitting: Family Medicine

## 2011-03-03 DIAGNOSIS — Z1231 Encounter for screening mammogram for malignant neoplasm of breast: Secondary | ICD-10-CM

## 2011-03-21 ENCOUNTER — Ambulatory Visit (INDEPENDENT_AMBULATORY_CARE_PROVIDER_SITE_OTHER): Payer: 59 | Admitting: Family Medicine

## 2011-03-21 ENCOUNTER — Encounter: Payer: Self-pay | Admitting: Family Medicine

## 2011-03-21 ENCOUNTER — Ambulatory Visit (HOSPITAL_COMMUNITY): Payer: 59 | Attending: Family Medicine

## 2011-03-21 VITALS — BP 138/88 | Temp 98.9°F | Wt 200.0 lb

## 2011-03-21 DIAGNOSIS — J029 Acute pharyngitis, unspecified: Secondary | ICD-10-CM

## 2011-03-21 NOTE — Progress Notes (Signed)
  Subjective:    Patient ID: [name] [name], female    DOB: 21-Jul-1971, 40 y.o.   MRN: [id number]  HPI Pt here f/u sore throat.  She thinks she saw something in back of throat with mirror.  It does not hurt to swallow.  She states it hurts when she belches.  No other symptoms.    Review of Systems    as above Objective:   Physical Exam  Constitutional: She is oriented to person, place, and time. She appears well-developed and well-nourished.  HENT:  Head: Normocephalic.  Right Ear: External ear normal.  Left Ear: External ear normal.  Mouth/Throat: Oropharynx is clear and moist. No oropharyngeal exudate.  Neck: Normal range of motion. Neck supple.  Lymphadenopathy:    She has no cervical adenopathy.  Neurological: She is alert and oriented to person, place, and time.  Psychiatric: She has a normal mood and affect. Her behavior is normal. Judgment and thought content normal.          Assessment & Plan:

## 2011-03-21 NOTE — Patient Instructions (Signed)
F/u ENT

## 2011-03-30 ENCOUNTER — Encounter: Payer: Self-pay | Admitting: Family Medicine

## 2011-03-30 ENCOUNTER — Ambulatory Visit (INDEPENDENT_AMBULATORY_CARE_PROVIDER_SITE_OTHER)
Admission: RE | Admit: 2011-03-30 | Discharge: 2011-03-30 | Disposition: A | Discharge status: Home / Self Care | Payer: 59 | Source: Ambulatory Visit | Attending: Family Medicine | Admitting: Family Medicine

## 2011-03-30 ENCOUNTER — Ambulatory Visit (INDEPENDENT_AMBULATORY_CARE_PROVIDER_SITE_OTHER): Payer: 59 | Admitting: Family Medicine

## 2011-03-30 ENCOUNTER — Ambulatory Visit (HOSPITAL_BASED_OUTPATIENT_CLINIC_OR_DEPARTMENT_OTHER)
Admission: RE | Admit: 2011-03-30 | Discharge: 2011-03-30 | Disposition: A | Discharge status: Home / Self Care | Payer: 59 | Source: Ambulatory Visit | Attending: Family Medicine | Admitting: Family Medicine

## 2011-03-30 VITALS — BP 132/80 | HR 94 | Temp 99.4°F | Resp 14 | Wt 200.0 lb

## 2011-03-30 DIAGNOSIS — M542 Cervicalgia: Secondary | ICD-10-CM | POA: Insufficient documentation

## 2011-03-30 DIAGNOSIS — S139XXA Sprain of joints and ligaments of unspecified parts of neck, initial encounter: Secondary | ICD-10-CM

## 2011-03-30 DIAGNOSIS — M549 Dorsalgia, unspecified: Secondary | ICD-10-CM | POA: Insufficient documentation

## 2011-03-30 DIAGNOSIS — M47812 Spondylosis without myelopathy or radiculopathy, cervical region: Secondary | ICD-10-CM

## 2011-03-30 DIAGNOSIS — M546 Pain in thoracic spine: Secondary | ICD-10-CM

## 2011-03-30 MED ORDER — TRAMADOL HCL 50 MG PO TABS: 50.0000 mg | ORAL_TABLET | Freq: Four times a day (QID) | ORAL | Status: AC | PRN

## 2011-03-30 MED ORDER — CYCLOBENZAPRINE HCL 10 MG PO TABS: 10.0000 mg | ORAL_TABLET | Freq: Three times a day (TID) | ORAL | Status: DC | PRN
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
## 2011-03-30 NOTE — Assessment & Plan Note (Signed)
Warm compresses Flexeril and ultram Chiropractor Check xrays

## 2011-04-04 NOTE — H&P (Signed)
[name] [name], [name] [name]              ACCOUNT NO.:  [id number]   MEDICAL RECORD NO.:  [id number]          PATIENT TYPE:  AMB   LOCATION:  SDC                           FACILITY:  [facility]   PHYSICIAN:  James A. Ashley Royalty, M.D.DATE OF BIRTH:  14-Dec-1970   DATE OF ADMISSION:  03/27/2007  DATE OF DISCHARGE:                              HISTORY & PHYSICAL   HISTORY OF PRESENT ILLNESS:  This is a 40 year old gravida 3, para 1, AB  2 who presented to me April 2008 complaining of pelvic pain for numerous  weeks, but worse over the most recent three to four weeks and  debilitating.  She has had several laparoscopies in the past.  She also  has a history of polycystic ovary syndrome and states she is interested  in having ovarian drilling.  Ultrasound was performed March 14, 2007 and  revealed the uterus to be approximately 11 x 6 x 6 cm without focal  abnormality.  The right adnexa was felt to be unremarkable and the left  adnexa was also without apparent abnormalities.   MEDICATIONS:  Celexa.   PAST MEDICAL HISTORY:  1. Polycystic ovary syndrome.  2. Fatty liver - managed by Dr. Lina Sar.  3. History of depression.   PAST SURGICAL HISTORY:  The patient has had several diagnostic  laparoscopies.   ALLERGIES:  ASA, IBUPROFEN, NAPROXEN, X-RAY CONTRAST.   FAMILY HISTORY:  Positive for hypertension and diabetes.   SOCIAL HISTORY:  The patient denies the use of tobacco or significant  alcohol.   REVIEW OF SYSTEMS:  Noncontributory.   PHYSICAL EXAMINATION:  GENERAL APPEARANCE:  Well-developed, well-  nourished, pleasant, white female in no acute distress.  VITAL SIGNS:  Afebrile with vital signs stable.  CHEST:  Lungs are clear.  CARDIOVASCULAR:  Regular rate and rhythm.  ABDOMEN:  Soft.  Nontender without masses or organomegaly.  MUSCULOSKELETAL:  No CVA tenderness.  PELVIC:  Please see most recent office exam.  External genitalia within  normal limits.  Vagina and cervix without gross  lesions.  Bimanual  examination revealed no obvious uterine enlargement or adnexal masses.   IMPRESSION:  1. Pelvic pain - etiology uncertain.  Debilitating.  2. Polycystic ovary.  3. Secondary infertility.  4. History of depression.  5. Fatty liver - managed by Dr. Juanda Chance.   PLAN:  Diagnostic/operative laparoscopy.  The risks, benefits and  alternatives have been discussed with the patient.  Possibility of  ovarian drilling discussed and accepted including possible complications  such as adhesions which could conceivably have a negative effect versus  fertility.  The patient states she understands and accepts.  Possibility  of unilateral salpingo-oophorectomy discussed and accepted.  Possibility  of exploratory laparotomy also discussed and accepted.  Questions  provided and answered.      James A. Ashley Royalty, M.D.  Electronically Signed     JAM/MEDQ  D:  03/27/2007  T:  03/27/2007  Job:  [id number]   cc:   [facility]

## 2011-04-04 NOTE — Consult Note (Signed)
[name] [name], [name] [name]              ACCOUNT NO.:  [id number]   MEDICAL RECORD NO.:  [id number]          PATIENT TYPE:  INP   LOCATION:  3703                         FACILITY:  [facility]   PHYSICIAN:  Rollene Rotunda, MD, FACCDATE OF BIRTH:  Feb 21, 1971   DATE OF CONSULTATION:  04/02/2009  DATE OF DISCHARGE:                                 CONSULTATION   PRIMARY CARE PHYSICIAN:  Neena Rhymes, M.D.   REASON FOR CONSULTATION:  Evaluate patient with chest pain.   HISTORY OF PRESENT ILLNESS:  The patient is a pleasant 40 year old white  female with no history of coronary artery disease.  She does report she  had a stress perfusion study in our office a couple of years ago.  She  says this was negative.  She developed chest discomfort about 2:30 today  at work.  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  She states  she is on the phone and the discomfort was severe enough to take her  breath away.  It was 7 out of 10 at its peak.  It started and slowly  progressed.  It was substernal.  She had a little discomfort up into her  neck.  She felt slightly lightheaded and weak.  She did feel her heart  racing.  She had been having some slight discomfort, not as severe as  this and infrequent for about 3 weeks.  It happened sporadically.  She  has not been very active, though she was walking on a treadmill about a  week ago.  With this she could not bring on any of these symptoms.  She  did not have associated nausea, vomiting or shortness of breath.  She  did not have any presyncope or syncope.  She did get a sublingual  nitroglycerin about 2 hours into the discomfort and it started the ease  the discomfort.  An EKG demonstrated sinus tachycardia with a rate of  101.  She had some nonspecific T-wave flattening and mild T-wave  inversions in V3 and V4 without an old EKG for comparison.  This was  nonspecific.   PAST MEDICAL HISTORY:  1. Depression (this is ongoing and currently not well  treated per the      patient).  2. Gastroesophageal reflux disease.  3. Polycystic ovaries.   PAST SURGICAL HISTORY:  1. Ovarian cystectomy.  2. Treatment of cervical dysplasia.  3. Laparoscopy.  4. Breast surgery for benign tumor resection.   ALLERGIES/INTOLERANCES:  IBUPROFEN, ASPIRIN AND IV CONTRAST DYE.   MEDICATIONS:  1. Celexa 40 mg daily.  2. Nexium 40 mg daily.  3. Xanax 0.5 mg daily.  4. Cymbalta 30 mg daily.   SOCIAL HISTORY:  The patient is married.  She has one 2 year old child.  She lost the child after 1 day of age with a tumor not long ago.  She  quit smoking many years ago.  She works as a Systems developer for [employer] at  [employer].   FAMILY HISTORY:  Negative for early coronary artery disease though it is  positive for diabetes mellitus.  She does have second degree  relatives  with later onset heart disease.   REVIEW OF SYSTEMS:  As stated in HPI and negative for all other systems.   PHYSICAL EXAMINATION:  The patient is in no distress.  Blood pressure 143/94, heart rate 96 and regular, respiratory rate 18,  saturation 99% on 2 liters, temperature 99.0.  HEENT:  Eyes unremarkable.  Pupils equal, round and reactive to light.  Fundi not visualized, oral mucosa unremarkable.  NECK:  No jugular distention at 45 degrees.  Carotid upstroke brisk and  symmetrical.  No bruits, thyromegaly.  LYMPHATICS:  No cervical, axillary, inguinal adenopathy.  LUNGS:  Clear to auscultation bilaterally.  BACK:  No costovertebral angle tenderness.  CHEST:  Unremarkable.  HEART:  PMI not displaced or sustained, S1-S2 within normal limits.  No  S3 or S4, no clicks, rubs, murmurs.  ABDOMEN:  Obese, positive bowel sounds, normal in frequency and pitch,  no bruits, rebound, guarding.  No midline pulsatile mass.  No hepatosplenomegaly.  SKIN:  No rashes, no __________  EXTREMITIES:  2+ pulses throughout, no edema, no cyanosis or clubbing.  NEURO:  Oriented to person, place and time.   Cranial nerves II-XII  grossly intact, motor grossly intact throughout.   EKG as above.   Labs pending.   ASSESSMENT/PLAN:  1. Chest discomfort.  The patient's chest comfort is predominantly      atypical.  She has had a negative stress perfusion study in the      past.  However, she does have some abnormal EKG findings and I have      no old EKG for comparison that is available.  She did have      discomfort that went away with nitroglycerin, although this is      certainly not diagnostic or even particularly helpful.  At this      point I would rule out myocardial infarction with cardiac enzymes.      Repeat an EKG in the morning.  If she has no enzyme elevation or      obvious EKG changes, she could have an outpatient stress perfusion      study.  If this is negative I would pursue a possible GI etiology.      2.  Depression.  She will continue the meds as listed per her      primary care doctor.  2. Risk reduction.  She should get a lipid profile fasting if she has      not had one recently.  3. Hypertension.  Blood pressure is slightly elevated today.  However,      will watch this and we can treat this as needed.      Rollene Rotunda, MD, [facility]  Electronically Signed     JH/MEDQ  D:  04/02/2009  T:  04/02/2009  Job:  [id number]

## 2011-04-07 NOTE — Assessment & Plan Note (Signed)
[facility]                         GASTROENTEROLOGY OFFICE NOTE   NAME:[name], [name] HAMIDI                     MRN:          [id number]  DATE:12/07/2006                            DOB:          10-20-1971    [name] [name] is a 40 year old white female crampy abdominal pain and  intermittent diarrhea.  She has been evaluated for irritable bowel  syndrome and inflammatory bowel disease.  Most recent colonoscopy on  October 21, 2006, included exam of the terminal ileum.  The  biopsies  showed nonspecific changes in the rectosigmoid colon, probably related  to the prep.  There was no evidence of ulcerative colitis or Crohn's  disease.  She continues to have diarrheal bowel movements and crampy  abdominal pain.  She also has irregular periods.  She denies history of  endometriosis.  We have treated her for irritable bowel syndrome with  antispasmodic, Bentyl 10 mg b.i.d., but she could not tolerate it  because of a headache.  She was in the past on Levbid 0.375 mg b.i.d.   MEDICATIONS:  1. Celexa 40 mg p.o. daily.  2. Hydrocodone p.r.n. abdominal pain.  3. Tums.  4. Nexium 40 mg a day.   PHYSICAL EXAMINATION:  VITAL SIGNS:  Blood pressure 100/70, pulse 72,  and weight declined.  GENERAL:  The patient was in no distress.  LUNGS:  Clear to auscultation.  CARDIAC:  Normal S1, normal S2.  ABDOMEN:  Soft, mildly obese, but tender in the left lower quadrant and  also in the suprapubic area and right lower quadrant.  No distention,  normal active bowel sounds.  RECTAL:  Deferred.   IMPRESSION:  74. A 40 year old white female with ongoing crampy abdominal pain and      diarrhea consistent with irritable bowel syndrome.  2. Pelvic pain, may be secondary to irritable bowel syndrome, rule out      endometriosis or pelvic inflammatory disease.  3. History of polycystic ovary, followed by Dr. Ashley [name].   PLAN:  1. Try Bentyl 10 mg again.  If the side effects develop  again, we will      try her on another antispasmodic e.g.  Levsin sublingually 0.125      mg.  2. To follow up with Dr. Sylvester [name] to assess for pelvic source of      GI-unrelated abdominal pain.     [name] [name]. [name] Chance, MD  Electronically Signed    DMB/MedQ  DD: 12/07/2006  DT: 12/07/2006  Job #: [id number]   cc:   [name] [name], D.O.  [name] [name], M.D.

## 2011-04-07 NOTE — Op Note (Signed)
NAME:  [name] [name], [name] [name]                        ACCOUNT NO.:  [id number]   MEDICAL RECORD NO.:  [id number]                   PATIENT TYPE:  AMB   LOCATION:  DSC                                  FACILITY:  [facility]   PHYSICIAN:  Sandria Bales. Ezzard Standing, M.D.               DATE OF BIRTH:  [birth year]   DATE OF PROCEDURE:  10/15/2002  DATE OF DISCHARGE:                                 OPERATIVE REPORT   PREOPERATIVE DIAGNOSIS:  Left nipple discharge with questionable ductal  papilloma at the 1 to 2 o'clock position of the left breast.   POSTOPERATIVE DIAGNOSIS:  Left nipple discharge, possible intraductal  papilloma from ductal system at the 1 to 2 o'clock position of the left  breast.   PROCEDURE:  Left breast biopsy with excision of ductal system.   SURGEON:  Sandria Bales. Ezzard Standing, M.D.   ANESTHESIA:  Local with 15 cc of 1% Xylocaine.   COMPLICATIONS:  None.   INDICATION FOR PROCEDURE:  The patient is a 40 year old female who has had a  nipple discharge from her left breast.  She had a ductogram on 10/01/02 by  Dr. Baird Lyons, which shows a filling defect in a left breast duct, most  likely representing a papilloma.  She now comes for surgical excision of  this area.  She also at about the 3 o'clock position had an area that you  could manually express some fluid.  I wondered if actually this ductal  system had ruptured through the skin.  I planned to try to excise this at  the same time.  I discussed the indications and potential complications,  including but not limited to infection, bleeding, and recurrent nipple  discharge.   DESCRIPTION OF PROCEDURE:  The patient was placed in the supine position,  given MAC anesthesia.  Her left breast was painted with Betadine solution  and sterilely draped.  I marked the skin where the duct system, actually I  thought I could feel part of this duct system through her areolar complex.  I was also able to express some clear fluid out.  I had also marked  an area  where she was worried about preop where she was worried that she could  actually express stuff through the skin.  I made a circumareolar incision  with sharp dissection carried down to the breast tissue.  I went toward the  areola, identified the dilated duct, divided the duct immediately under the  nipple, and placed a ductal probe into the duct to help better define it.  I  then excised a block of breast tissue approximately 3 cm in diameter with 4-  5 cm in length, excising out all the abnormal-appearing ductal system.  I  also went under the skin at the 3 o'clock position and excised anything that  would have been connected to the skin with this.   I then irrigated the wound, did  hemostasis with 3-0 Vicryl sutures and Bovie  electrocautery, and closed the subcutaneous tissues with a 3-0 Vicryl  suture, the skin with a 5-0 Vicryl suture.  I painted the wound with  tincture of Benzoin and Steri-Strips and steri-stripped it.   She tolerated the procedure well.  She is discharged home today, to return  to see me in 10-14 days.  Call for any problems.                                               Sandria Bales. Ezzard Standing, M.D.    DHN/MEDQ  D:  10/15/2002  T:  10/15/2002  Job:  [id number]   cc:   Lilla Shook, M.D.  [address], [address]  [place]  [place]  [address]  Fax: [phone]   Rudy Jew. Ashley Royalty, M.D.  [address]  [place], [place] [address]  Fax: [phone]   Baird Lyons, M.D.

## 2011-04-07 NOTE — Assessment & Plan Note (Signed)
[facility]                         GASTROENTEROLOGY OFFICE NOTE   NAME:HUNTER, LAPORCHIA NAKAJIMA                     MRN:          [id number]  DATE:12/09/2007                            DOB:          Feb 02, 1971    Luster Landsberg is a 40 year old white female with history of irritable bowel  syndrome, gastroesophageal reflux disease, family history of ulcerative  colitis.  She now comes with intermittent rectal irritation and bleeding  with onset of constipation.  We saw her in 1999 and subsequently in  December 2007 for colonoscopy, which showed nonspecific changes in  rectal sigmoid colon which were related to prep.  She also has internal  hemorrhoids.  She just had an unsuccessful pregnancy and has had  a  miscarriage followed by some depression.  She has become rather  constipated.  She has noticed bright red blood per rectum and also pain  in the rectum when she strains.  Patient has taken stool softeners which  do not really seem to help the constipation.  There is also additional  history of B12 deficiency in November 2006 and fatty liver on upper  abdominal ultrasound in June 2006.   PHYSICAL EXAMINATION:  Blood pressure:  120/84.  Pulse:  80.  Weight:  211 pounds.  LUNGS:  Clear to auscultation.  COR:  Normal S1, S2.  ABDOMEN:  Soft, minimally tender in left lower quadrant.  No palpable  mass.  No fullness.  Right lower quadrant was normal.  There was no  distention over the costal margin.  There were stretch marks over her  abdomen.  RECTAL:  Anoscopic exam reveals 1+ external hemorrhoids and 1+ internal  hemorrhoids with swelling, bluish discoloration, but no evidence of  thrombosis. Stool is yellow.  Hemoccult negative.  No evidence of  proctitis.  No evidence of fissure.   IMPRESSION:  1. [age]-year-old white female with symptomatic grade 1 internal      and external hemorrhoids.  2. Irritable bowel syndrome/left lower quadrant discomfort  status post      colonoscopy in December 2007.  Patient has a positive family      history of ulcerative colitis in a grandparent.  3. Recent miscarriage.  4. History of gastroesophageal reflux, currently not a problem, on      Nexium 40 mg p.r.n.   PLAN:  1. Anusol HC suppositories.  2. Analpram cream 2.5% given to the patient.  3. Samples of MiraLax 9-17 gm daily or every other day for      constipation.     Hedwig Morton. Juanda Chance, MD  Electronically Signed    DMB/MedQ  DD: 12/09/2007  DT: 12/09/2007  Job #: [id number]

## 2011-04-07 NOTE — H&P (Signed)
   NAME:  [name] [name], [name] [name]                        ACCOUNT NO.:  [id number]   MEDICAL RECORD NO.:  [id number]                   PATIENT TYPE:  AMB   LOCATION:  SDC                                  [facility]ILITY[facility]   PHYSICIAN:  James A. Ashley Royalty, M.D.             DATE OF BIRTH:  [birth year]   DATE OF ADMISSION:  06/25/2002  DATE OF DISCHARGE:                                HISTORY & PHYSICAL   HISTORY OF PRESENT ILLNESS:  This is a 40 year old gravida 1, para 1, with  CIN of the cervix with large geographic lesion, for laser vaporization.  The  patient also has nearly constant lower abdominal discomfort which is equal  bilaterally and debilitating to her, for which she seeks surgical  intervention.  She denies any dyspareunia and has only minimal dysmenorrhea.   PAST MEDICAL HISTORY:  Medical:  Negative.  Surgical:  Laparoscopy, laser  for vaginal warts, wisdom teeth, right breast biopsy, orthopedic surgery on  the left thumb.   ALLERGIES:  None.   FAMILY HISTORY:  Positive for hypertension, diabetes.   SOCIAL HISTORY:  The patient denies the use of tobacco or excessive alcohol.   REVIEW OF SYSTEMS:  Noncontributory.   PHYSICAL EXAMINATION:  GENERAL:  A well-developed, well-nourished, pleasant  white female in no acute distress.   VITAL SIGNS:  Afebrile, vital signs stable.   SKIN:  Warm and dry without lesions.   LYMPHATIC:  There is no supraclavicular, cervical, or inguinal adenopathy.   HEENT:  Normocephalic.   NECK:  Supple without thyromegaly.   CHEST:  Lungs are clear.   CARDIAC:  Regular rate and rhythm without murmurs, gallop, or rubs.   BREASTS:  Deferred.   ABDOMEN:  Soft and nontender without masses or organomegaly.  Bowel sounds  are active.   MUSCULOSKELETAL:  Full range of motion without edema, cyanosis, joint  effusion, or tenderness.   PELVIC:  Deferred until examination under anesthesia.   IMPRESSION:  1. Lower abdominal/pelvic discomfort, etiology  uncertain.  The differential     includes endometriosis, adhesions, primary gastrointestinal.  2. Cervical intraepithelial neoplasia I of the cervix with large geographic     lesion.   PLAN:  1. Diagnostic/operative laparoscopy.  2. Laser vaporization of the cervix.   Risks, benefits, complications, and alternatives fully discussed with the  patient.  The possibility of a unilateral salpingo-oophorectomy or  exploratory laparotomy were discussed and accepted.  Questions invited and  answered.                                                James A. Ashley Royalty, M.D.    JAM/MEDQ  D:  06/25/2002  T:  06/25/2002  Job:  [id number]

## 2011-04-07 NOTE — Op Note (Signed)
[facility]  Patient:    [name] [name]                  MRN: [id number] Proc. Date: 09/27/99 Adm. Date:  [id number] Attending:  Derrek Monaco CC:         Katy Fitch. Sypher, Montez Hageman., M.D. (2)             Anesthesia.                           Operative Report  PREOPERATIVE DIAGNOSIS:  Status post laceration of dorsal aspect of left thumb interphalangeal joint with extensor lag, rule out extensor pollicis longus laceration.  POSTOPERATIVE DIAGNOSIS:  Extensor pollicis laceration involving interphalangeal joint, left thumb.  OPERATION PERFORMED:  Exploration of left thumb interphalangeal joints with identification of laceration extensor pollicis longus followed by repair of the  extensor pollicis longus.  SURGEON:  Katy Fitch. Sypher, Montez Hageman., M.D.  ASSISTANT:  Jaquelyn Bitter. Chabon, P.A.  ANESTHESIA:  0.25% Marcaine and 2% lidocaine metacarpal head level block, left thumb supplemented by IV sedation.  SUPERVISING ANESTHESIOLOGIST:  Dr. Jacklynn Bue.  INDICATIONS FOR PROCEDURE:  [name] [name] is a 40 year old girl who sustained  laceration to the dorsal aspect of her left thumb.  She was seen at the [facility] emergency room.  Her wound was cleaned in the emergency room but not explored under tourniquet.  She was advised to seek a hand surgery consult.  On examination at the office, she was noted to have and extensor lag at the IP joint; therefore, referral for formal exploration in the operating room was recommended.  DESCRIPTION OF PROCEDURE:  [name] [name] was brought to the operating room and  placed in supine position on the operating table.  Following routine Betadine prep, 0.25% Marcaine and 2% lidocaine were infiltrated at metacarpal head level to obtain a digital block.  When anesthesia was satisfactory, the arm was prepped with Betadine soap and solution and sterile draped.  Following exsanguination of the  limb with an Esmarch bandage, the arterial tourniquet was inflated to 220 mmHg.  The procedure commenced with removal of the previous sutures.  The wound was extended with proximal and  distal longitudinal extensions to create a lazy-S incision.  The extensor tendon was immediately noted to be lacerated and retracted more than 1 cm.  The interphalangeal joint was violated with the reactive synovitis being noted. The synovitis was removed.  The joint was irrigated with sterile saline followed by  repair of the extensor pollicis longus with two core sutures of 3-0 Ethibond followed by a finishing suture of 3-0 Ethibond.  Anatomic repair was achieved.  The wound was then repaired with interrupted sutures of 5-0 nylon followed by placement in a thumb spica splint with the MP and IP joints in full extension.  There were no apparent complications.  The patient tolerated the surgery and anesthesia well and was transferred to the recovery room with stable vital signs. DD:  09/27/99 TD:  09/28/99 Job: [id number]

## 2011-04-07 NOTE — Op Note (Signed)
NAME:  [name] [name], [name] [name]                ACCOUNT NO.:  [id number]   MEDICAL RECORD NO.:  [id number]                   PATIENT TYPE:  AMB   LOCATION:  SDC                                  FACILITY:  [facility]   PHYSICIAN:  James A. Ashley Royalty, M.D.             DATE OF BIRTH:  05-22-1971   DATE OF PROCEDURE:  DATE OF DISCHARGE:  06/25/2002                                 OPERATIVE REPORT   PREOPERATIVE DIAGNOSES:  1. Lower abdominal/pelvic pain, etiology uncertain.  Differential includes     adhesions, endometriosis, primary gastrointestinal.  2. CIN of the cervix with large geographic lesion.   PROCEDURE:  1. Diagnostic/operative laparoscopy with lysis of adhesions.  2. Laser vaporization of the cervix.  3. Clomid pertubation of the oviducts.  4. Removal of free floating intra-abdominal cyst located in the cul-de-sac.   SURGEON:  Rudy Jew. Ashley Royalty, M.D.   ANESTHESIA:  General.   ESTIMATED BLOOD LOSS:  Less than 25 cc.   COMPLICATIONS:  None.   PACKS AND DRAINS:  None.   PROCEDURE:  The patient was taken to the operating room and placed in the  dorsal supine position.  A general endotracheal anesthesia was administered.  She was placed in the lithotomy position and prepped and draped in the usual  manner for abdominal and vaginal surgery.  Posterior weighted retractor was  placed per vagina.  The anterior lip of the cervix was grasped with a single  tooth tenaculum.  Jarco uterine manipulator was placed into the cervix and  held in place with a tenaculum.  The bladder was drained with a red rubber  catheter.   A 1.2 cm infraumbilical incision was made in the longitudinal plane.  Prior  to making this incision approximately 3 cc of 0.25% bupivacaine was  instilled into the area to aid in postoperative analgesia.  A size 10  disposable laparoscopic trocar was placed into the abdominal cavity.  Its  location was verified by placement of the laparoscope.  There was no  evidence of any trauma.  Pneumoperitoneum was created with CO2 and  maintained throughout the procedure.  Next, two 5 mm suprapubic trocars were  placed in the left and right lower quadrants respectively.  Transillumination and direct visualization techniques were employed.  The  pelvis was thoroughly surveyed.  Uterus was normal size, shape, and contour  without evidence of any fibroids or any endometriosis.  The fallopian tubes  were normal size, shape, contour, and length bilaterally with luxuriant  fimbria.  The ovaries were upper limits of normal in size consistent with  polycystic ovary, but normal shape and contour without evidence of any cysts  or endometriosis.  There was an adhesion from the left colon to close to the  left ovary.  It could not be ascertained for sure whether this could be  responsible in all or in part for the patient's pain.  The remainder of the  peritoneal surfaces were smooth and glistening.  The cul-de-sac was clear.  There was a rather amorphus free floating cyst in the cul-de-sac which was  removed through the superior port and submitted to pathology for histologic  studies.  The adhesion was cauterized with the bipolar cautery and lysed  with the scissors.  Appropriate photos were obtained.  Next,  chromopertubation of the oviducts was accomplished with indigo carmine via  the Jarco uterine manipulator.  The flow was noted to be quite copious  bilaterally, thus documenting bilateral tubal patency.   At this point the patient was felt to have benefitted maximally from the  laparoscopic portion of the procedure.  The abdominal instruments were then  removed and pneumoperitoneum evacuated.  Fascial defects were closed with 0  Vicryl in the interrupted fashion.  Skin was closed with 3-0 chromic in a  subcuticular fashion.  The remainder of 10 cc of bupivacaine 0.25% was  instilled into the incisions to aid in postoperative analgesia.   After the laser  vaporization procedure was completed, the vaginal  instruments were removed and all drapes were removed.  Moist towels were  then placed over the patient's inner thighs.  Galvanized speculum was placed  per vagina.  The cervix was visualized through the laser colposcope.  The  cervix was bathed with 5% acetic acid.  The areas of white epithelium noted  in the office and the large geographic lesion were once again encountered.  The laser was set at 30 watts power in medium spot size.  The entire  abnormality was then circumscribed using the intermittent pulse technique.  The cervix was then thoroughly circumscribed with the laser and divided into  four quadrants.  The posterior quadrants were vaporized first.  Each  quadrant was vaporized to a level of 5-7 mm below the surrounding  epithelium.  The anterior quadrants were then vaporized successfully to the  same level.  The __________  beam was then used throughout the bed to aid in  hemostasis.  Hemostasis was noted.  The entire bed was then bathed with  Monsel solution, again to aid in postoperative hemostasis.  At this point  the procedure was terminated.  The vaginal instruments were removed and  hemostasis noted.   The patient was then taken to the recovery room in excellent condition.                                               James A. Ashley Royalty, M.D.    JAM/MEDQ  D:  06/25/2002  T:  06/27/2002  Job:  [id number]

## 2011-05-01 ENCOUNTER — Ambulatory Visit (INDEPENDENT_AMBULATORY_CARE_PROVIDER_SITE_OTHER): Payer: 59 | Admitting: Family Medicine

## 2011-05-01 ENCOUNTER — Encounter: Payer: Self-pay | Admitting: Family Medicine

## 2011-05-01 VITALS — BP 120/84 | HR 90 | Temp 98.4°F | Wt 200.0 lb

## 2011-05-01 DIAGNOSIS — R319 Hematuria, unspecified: Secondary | ICD-10-CM

## 2011-05-01 DIAGNOSIS — R1031 Right lower quadrant pain: Secondary | ICD-10-CM

## 2011-05-01 LAB — POCT URINALYSIS DIPSTICK
Bilirubin, UA: NEGATIVE
Glucose, UA: NEGATIVE
Leukocytes, UA: NEGATIVE
Nitrite, UA: NEGATIVE

## 2011-05-01 MED ORDER — CIPROFLOXACIN HCL 500 MG PO TABS: 500.0000 mg | ORAL_TABLET | Freq: Two times a day (BID) | ORAL | Status: AC

## 2011-05-01 NOTE — Progress Notes (Signed)
  Subjective:     [name] [name] is a 40 y.o. female who presents for evaluation of abdominal pain. Onset was 6 weeks ago. Symptoms have been gradually worsening. The pain is described as sharp, and is 9/10 in intensity. Pain is located in the RUQ and RLQ with radiation to back.  Aggravating factors: activity, movement and standing.  Alleviating factors: rest. Associated symptoms: belching and nausea. The patient denies anorexia, arthralagias, chills, constipation, diarrhea, dysuria, fever, frequency, headache, hematochezia, hematuria, melena, myalgias and sweats.  The patient's history has been marked as reviewed and updated as appropriate.  Review of Systems Pertinent items are noted in HPI.     Objective:    BP 120/84  Pulse 90  Temp(Src) 98.4 F (36.9 C) (Oral)  Wt 200 lb (90.719 kg)  SpO2 98% General appearance: alert, cooperative, appears stated age and no distress Throat: lips, mucosa, and tongue normal; teeth and gums normal Lungs: clear to auscultation bilaterally Heart: regular rate and rhythm, S1, S2 normal, no murmur, click, rub or gallop Abdomen: abnormal findings:  distended, guarding and moderate tenderness in the RLQ Lymph nodes: Cervical, supraclavicular, and axillary nodes normal.    Assessment:    Abdominal pain,  R/o AP .    Plan:    The diagnosis was discussed with the patient and evaluation and treatment plans outlined. See orders for lab and imaging studies. Adhere to simple, bland diet. Further follow-up plans will be based on outcome of lab/imaging studies; see orders. Follow up as needed.

## 2011-05-01 NOTE — Patient Instructions (Signed)
Abdominal Pain Abdominal pain can be caused by many things. Your caregiver decides the seriousness of your pain by an examination and possibly blood tests and X-rays. Many cases can be observed and treated at home. Most abdominal pain is not caused by a disease and will probably improve without treatment. However, in many cases, more time must pass before a clear cause of the pain can be found. Before that point, it may not be known if you need more testing, or if hospitalization or surgery is needed. HOME CARE INSTRUCTIONS  Do not take laxatives unless directed by your caregiver.   Take pain medicine only as directed by your caregiver.   Only take over-the-counter or prescription medicines for pain, discomfort, or fever as directed by your caregiver.   Try a clear liquid diet (broth, tea, or water) for few days or as ordered by your caregiver. Slowly move to a bland diet as tolerated.  SEEK IMMEDIATE MEDICAL CARE IF:  The pain does not go away.   You or your child has an oral temperature above 100.4, not controlled by medicine.   You keep throwing up (vomiting).   The pain is felt only in portions of the abdomen. Pain in the right side could possibly be appendicitis. In an adult, pain in the left lower portion of the abdomen could be colitis or diverticulitis.   You pass bloody or black tarry stools.  MAKE SURE YOU:  Understand these instructions.   Will watch your condition.   Will get help right away if you are not doing well or get worse.  Document Released: 08/16/2005 Document Re-Released: 01/31/2010 [employer] Patient Information 2011 [employer]. 

## 2011-05-02 LAB — HEPATIC FUNCTION PANEL
ALT: 17 U/L (ref 0–35)
AST: 18 U/L (ref 0–37)
Albumin: 4.3 g/dL (ref 3.5–5.2)
Alkaline Phosphatase: 60 U/L (ref 39–117)
Bilirubin, Direct: 0.1 mg/dL (ref 0.0–0.3)
Total Bilirubin: 0.4 mg/dL (ref 0.3–1.2)
Total Protein: 7.6 g/dL (ref 6.0–8.3)

## 2011-05-02 LAB — CBC WITH DIFFERENTIAL/PLATELET
Basophils Absolute: 0 10*3/uL (ref 0.0–0.1)
Eosinophils Absolute: 0.5 10*3/uL (ref 0.0–0.7)
HCT: 39.2 % (ref 36.0–46.0)
Hemoglobin: 13.2 g/dL (ref 12.0–15.0)
Lymphs Abs: 2.5 10*3/uL (ref 0.7–4.0)
MCHC: 33.6 g/dL (ref 30.0–36.0)
Monocytes Relative: 5.2 % (ref 3.0–12.0)
Neutro Abs: 4.6 10*3/uL (ref 1.4–7.7)
RDW: 13.1 % (ref 11.5–14.6)

## 2011-05-02 LAB — BASIC METABOLIC PANEL
CO2: 27 mEq/L (ref 19–32)
Chloride: 104 mEq/L (ref 96–112)
Glucose, Bld: 163 mg/dL — ABNORMAL HIGH (ref 70–99)
Potassium: 4 mEq/L (ref 3.5–5.1)
Sodium: 137 mEq/L (ref 135–145)

## 2011-05-02 NOTE — Progress Notes (Signed)
Addended by: Legrand Como on: 05/02/2011 11:04 AM   Modules accepted: Orders

## 2011-05-03 ENCOUNTER — Ambulatory Visit (HOSPITAL_COMMUNITY)
Admission: RE | Admit: 2011-05-03 | Discharge: 2011-05-03 | Disposition: A | Discharge status: Home / Self Care | Payer: 59 | Source: Ambulatory Visit | Attending: Family Medicine | Admitting: Family Medicine

## 2011-05-03 DIAGNOSIS — N200 Calculus of kidney: Secondary | ICD-10-CM | POA: Insufficient documentation

## 2011-05-03 DIAGNOSIS — R3129 Other microscopic hematuria: Secondary | ICD-10-CM | POA: Insufficient documentation

## 2011-05-03 DIAGNOSIS — Q619 Cystic kidney disease, unspecified: Secondary | ICD-10-CM | POA: Insufficient documentation

## 2011-05-03 DIAGNOSIS — R109 Unspecified abdominal pain: Secondary | ICD-10-CM | POA: Insufficient documentation

## 2011-05-03 DIAGNOSIS — R319 Hematuria, unspecified: Secondary | ICD-10-CM

## 2011-05-03 DIAGNOSIS — R1031 Right lower quadrant pain: Secondary | ICD-10-CM | POA: Insufficient documentation

## 2011-05-03 LAB — URINE CULTURE: Organism ID, Bacteria: NO GROWTH

## 2011-05-03 MED ORDER — IOHEXOL 300 MG/ML  SOLN: 100.0000 mL | Freq: Once | INTRAMUSCULAR | Status: AC | PRN

## 2011-05-12 ENCOUNTER — Other Ambulatory Visit: Payer: Self-pay

## 2011-05-12 MED ORDER — VENLAFAXINE HCL ER 75 MG PO CP24: ORAL_CAPSULE | ORAL | Status: DC

## 2011-05-12 MED ORDER — VENLAFAXINE HCL ER 150 MG PO CP24: ORAL_CAPSULE | ORAL | Status: DC

## 2011-05-12 NOTE — Telephone Encounter (Signed)
OK per Dr.Lowne for a 30 days supply     KP

## 2011-06-11 ENCOUNTER — Other Ambulatory Visit: Payer: Self-pay | Admitting: Family Medicine

## 2011-06-12 MED ORDER — LORAZEPAM 1 MG PO TABS: ORAL_TABLET | ORAL | Status: DC

## 2011-06-12 NOTE — Telephone Encounter (Signed)
Last seen 05/01/11 and filled 02/14/11. Per patient disregard the Effexor    KP

## 2011-06-12 NOTE — Telephone Encounter (Signed)
Faxed.   KP 

## 2011-08-07 ENCOUNTER — Ambulatory Visit (INDEPENDENT_AMBULATORY_CARE_PROVIDER_SITE_OTHER): Payer: 59 | Admitting: Family Medicine

## 2011-08-07 ENCOUNTER — Encounter: Payer: Self-pay | Admitting: Family Medicine

## 2011-08-07 ENCOUNTER — Ambulatory Visit: Payer: 59 | Admitting: Family Medicine

## 2011-08-07 DIAGNOSIS — H5711 Ocular pain, right eye: Secondary | ICD-10-CM

## 2011-08-07 DIAGNOSIS — H5710 Ocular pain, unspecified eye: Secondary | ICD-10-CM

## 2011-08-07 DIAGNOSIS — S0990XA Unspecified injury of head, initial encounter: Secondary | ICD-10-CM

## 2011-08-07 MED ORDER — TRAMADOL HCL 50 MG PO TABS: 50.0000 mg | ORAL_TABLET | Freq: Four times a day (QID) | ORAL | Status: DC | PRN

## 2011-08-07 NOTE — Patient Instructions (Signed)
Headache, General, Without Cause   The specific cause of your headache may not have been found today. There are many causes and types of headache. A few common ones are:   Tension headache.   Migraine.  Infections (examples: dental and sinus infections).   Bone and/or joint problems in the neck or jaw.  Depression.   Eye problems.    These headaches are not life threatening.   Headaches can sometimes be diagnosed by a patient history and a physical exam. Sometimes, lab and imaging studies (such as x-ray and/or CT scan) are used to rule out more serious problems. In some cases, a spinal tap (lumbar puncture) may be requested. There are many times when your exam and tests may be normal on the first visit even when there is a serious problem causing your headaches. Because of that, it is very important to follow up with your doctor or local clinic for further evaluation.   HOME CARE INSTRUCTIONS   Keep follow-up appointments with your caregiver, or any specialist referral.   Only take over-the-counter or prescription medicines for pain, discomfort, or fever as directed by your caregiver.   Biofeedback, massage, or other relaxation techniques may be helpful.   Ice packs or heat applied to the head and neck can be used. Do this three to four times per day, or as needed.   Call your doctor if you have any questions or concerns.   If you smoke, you should quit.   FINDING OUT THE RESULTS OF TESTS   If a radiology test was performed, a radiologist will review your results.   You will be contacted by the emergency department or your physician if any test results require a change in your treatment plan.   Not all test results may be available during your visit. If your test results are not back during the visit, make an appointment with your caregiver to find out the results. Do not assume everything is normal if you have not heard from your caregiver or the medical facility. It is important for you to follow up on all of your  test results.   SEEK MEDICAL CARE IF:   You develop problems with medications prescribed.   You do not respond to or obtain relief from medications.   You have a change from the usual headache.   You develop nausea or vomiting.   SEEK IMMEDIATE MEDICAL CARE IF:   If your headache becomes severe.   You have an unexplained oral temperature above 100.4, or as your caregiver suggests.   You have a stiff neck.   You have loss of vision.   You have muscular weakness.   You have loss of muscular control.   You develop severe symptoms different from your first symptoms.   You start losing your balance or have trouble walking.   You feel faint or pass out.   MAKE SURE YOU:   Understand these instructions.   Will watch your condition.   Will get help right away if you are not doing well or get worse.   Document Released: 11/06/2005 Document Re-Released: 10/19/2008   ExitCare® Patient Information ©2011 [employer].

## 2011-08-07 NOTE — Progress Notes (Signed)
  Subjective:    [name] [name] is a 40 y.o. female who presents for evaluation of headache. Symptoms began about 1 week ago. Generally, the headaches last about its been constant since head injury.  Her 4yo nephew kneed her hard in R eye about 1 weeks ago. . The headaches do not seem to be related to any time of the day. The headaches are usually sharp and are located in R eye.  The patient rates her most severe headaches a 7 on a scale from 1 to 10. Recently, the headaches have been increasing in both severity and frequency. Work attendance or other daily activities are not affected by the headaches. Precipitating factors include: none which have been determined. The headaches are usually not preceded by an aura. Associated neurologic symptoms: dizziness and nausea. The patient denies depression, muscle weakness, numbness of extremities, speech difficulties, vomiting in the early morning and worsening school/work performance. Home treatment has included acetaminophen, darkening the room, resting and sleeping with no improvement. Other history includes: nothing pertinent. Family history includes no known family members with significant headaches.  The following portions of the patient's history were reviewed and updated as appropriate: allergies, current medications, past family history, past medical history, past social history, past surgical history and problem list.  Review of Systems Pertinent items are noted in HPI.    Objective:    BP 136/94  Pulse 96  Temp(Src) 98.8 F (37.1 C) (Oral)  Wt 209 lb (94.802 kg)  SpO2 98% General appearance: alert, cooperative, appears stated age and no distress Head: Normocephalic, without obvious abnormality, atraumatic-- no pain with palpation of R orbit Eyes: conjunctivae/corneas clear. PERRL, EOM's intact. Fundi benign., pain with looking up/down and side to side Ears: normal TM's and external ear canals both ears Throat: lips, mucosa, and tongue  normal; teeth and gums normal Neck: no adenopathy and supple, symmetrical, trachea midline Neurologic: Alert and oriented X 3, normal strength and tone. Normal symmetric reflexes. Normal coordination and gait    Assessment:    headache--s/p head trauma    Plan:    Lie in darkened room and apply cold packs as needed for pain. MRI ordered  Pt will see optho first to check Eye and EOM

## 2011-09-01 LAB — COMPREHENSIVE METABOLIC PANEL WITH GFR
ALT: 19
AST: 14
Albumin: 2.9 — ABNORMAL LOW
Alkaline Phosphatase: 61
BUN: 12
CO2: 24
Calcium: 9.2
Chloride: 100
Creatinine, Ser: 0.68
GFR calc Af Amer: >60
GFR calc non Af Amer: >60
Glucose, Bld: 98
Potassium: 3.7
Sodium: 134 — ABNORMAL LOW
Total Bilirubin: 0.7
Total Protein: 6.7

## 2011-09-01 LAB — URINALYSIS, ROUTINE W REFLEX MICROSCOPIC
Glucose, UA: NEGATIVE
Hgb urine dipstick: NEGATIVE
Specific Gravity, Urine: 1.015
Urobilinogen, UA: 0.2
pH: 6.5

## 2011-09-01 LAB — CBC
HCT: 32.9 — ABNORMAL LOW
Hemoglobin: 11.2 — ABNORMAL LOW
MCHC: 34
MCHC: 34.1
MCV: 81
Platelets: 322
RBC: 4.06
RDW: 14.2 — ABNORMAL HIGH
RDW: 14.8 — ABNORMAL HIGH
WBC: 19.1 — ABNORMAL HIGH

## 2011-09-01 LAB — COMPREHENSIVE METABOLIC PANEL
ALT: 17
Alkaline Phosphatase: 62
BUN: 5 — ABNORMAL LOW
CO2: 25
Calcium: 8.9
GFR calc non Af Amer: >60
Glucose, Bld: 91
Sodium: 134 — ABNORMAL LOW
Total Protein: 6.7

## 2011-09-01 LAB — URIC ACID: Uric Acid, Serum: 3.5

## 2011-09-01 LAB — LACTATE DEHYDROGENASE: LDH: 118

## 2011-09-12 ENCOUNTER — Telehealth: Payer: Self-pay

## 2011-09-12 DIAGNOSIS — Z1231 Encounter for screening mammogram for malignant neoplasm of breast: Secondary | ICD-10-CM

## 2011-09-12 NOTE — Telephone Encounter (Signed)
Ok to order diagnostic mammo

## 2011-09-12 NOTE — Telephone Encounter (Signed)
Done  KP

## 2011-09-12 NOTE — Telephone Encounter (Signed)
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 Patient is requesting a mammogram. Please advise      KP

## 2011-09-13 ENCOUNTER — Other Ambulatory Visit: Payer: Self-pay

## 2011-09-13 DIAGNOSIS — N644 Mastodynia: Secondary | ICD-10-CM

## 2011-09-25 ENCOUNTER — Ambulatory Visit
Admission: RE | Admit: 2011-09-25 | Discharge: 2011-09-25 | Disposition: A | Discharge status: Home / Self Care | Payer: 59 | Source: Ambulatory Visit | Attending: Family Medicine | Admitting: Family Medicine

## 2011-09-25 DIAGNOSIS — N644 Mastodynia: Secondary | ICD-10-CM

## 2011-09-27 ENCOUNTER — Encounter: Payer: Self-pay | Admitting: Family Medicine

## 2011-09-27 ENCOUNTER — Ambulatory Visit (INDEPENDENT_AMBULATORY_CARE_PROVIDER_SITE_OTHER): Payer: 59 | Admitting: Family Medicine

## 2011-09-27 VITALS — BP 120/74 | HR 104 | Temp 97.9°F | Ht 68.0 in | Wt 209.6 lb

## 2011-09-27 DIAGNOSIS — M791 Myalgia, unspecified site: Secondary | ICD-10-CM

## 2011-09-27 DIAGNOSIS — F3289 Other specified depressive episodes: Secondary | ICD-10-CM

## 2011-09-27 DIAGNOSIS — F32A Depression, unspecified: Secondary | ICD-10-CM

## 2011-09-27 DIAGNOSIS — F329 Major depressive disorder, single episode, unspecified: Secondary | ICD-10-CM

## 2011-09-27 DIAGNOSIS — K219 Gastro-esophageal reflux disease without esophagitis: Secondary | ICD-10-CM

## 2011-09-27 DIAGNOSIS — Z0000 Encounter for general adult medical examination without abnormal findings: Secondary | ICD-10-CM

## 2011-09-27 DIAGNOSIS — IMO0001 Reserved for inherently not codable concepts without codable children: Secondary | ICD-10-CM

## 2011-09-27 LAB — HEPATIC FUNCTION PANEL
ALT: 16 U/L (ref 0–35)
AST: 15 U/L (ref 0–37)
Albumin: 4 g/dL (ref 3.5–5.2)

## 2011-09-27 LAB — BASIC METABOLIC PANEL
CO2: 26 mEq/L (ref 19–32)
Chloride: 107 mEq/L (ref 96–112)
GFR: 87.85 mL/min (ref >60.00)
Glucose, Bld: 94 mg/dL (ref 70–99)
Potassium: 3.7 mEq/L (ref 3.5–5.1)
Sodium: 141 mEq/L (ref 135–145)

## 2011-09-27 LAB — LIPID PANEL
Cholesterol: 185 mg/dL (ref 0–200)
VLDL: 13 mg/dL (ref 0.0–40.0)

## 2011-09-27 LAB — POCT URINALYSIS DIPSTICK
Glucose, UA: NEGATIVE
Ketones, UA: NEGATIVE
Spec Grav, UA: 1.015

## 2011-09-27 LAB — CBC WITH DIFFERENTIAL/PLATELET
Basophils Absolute: 0 10*3/uL (ref 0.0–0.1)
HCT: 38.8 % (ref 36.0–46.0)
Hemoglobin: 13 g/dL (ref 12.0–15.0)
Lymphs Abs: 2.8 10*3/uL (ref 0.7–4.0)
MCV: 86.7 fl (ref 78.0–100.0)
Monocytes Relative: 7.3 % (ref 3.0–12.0)
Neutro Abs: 5.1 10*3/uL (ref 1.4–7.7)
RDW: 13.2 % (ref 11.5–14.6)

## 2011-09-27 LAB — SEDIMENTATION RATE: Sed Rate: 16 mm/hr (ref 0–22)

## 2011-09-27 LAB — TSH: TSH: 2.4 u[IU]/mL (ref 0.35–5.50)

## 2011-09-27 MED ORDER — BUPROPION HCL ER (XL) 150 MG PO TB24: ORAL_TABLET | ORAL | Status: DC

## 2011-09-27 MED ORDER — OMEPRAZOLE 20 MG PO CPDR: DELAYED_RELEASE_CAPSULE | ORAL | Status: DC

## 2011-09-27 NOTE — Progress Notes (Signed)
Subjective:     [name] [name] is a 40 y.o. female and is here for a comprehensive physical exam. The patient reports no problems.  History   Social History  . Marital Status: Legally Separated    Spouse Name: N/A    Number of Children: N/A  . Years of Education: N/A   Occupational History  . Not on file.   Social History Main Topics  . Smoking status: Former Smoker -- 0.1 packs/day for 15 years    Types: Cigarettes    Quit date: 09/26/2010  . Smokeless tobacco: Never Used  . Alcohol Use: No  . Drug Use: No  . Sexually Active: Yes -- Female partner(s)   Other Topics Concern  . Not on file   Social History Narrative  . No narrative on file   Health Maintenance  Topic Date Due  . Pap Smear  09/25/1991  . Tetanus/tdap  09/24/1992  . Influenza Vaccine  08/21/2011    The following portions of the patient's history were reviewed and updated as appropriate: allergies, current medications, past family history, past medical history, past social history, past surgical history and problem list.  Review of Systems Review of Systems  Constitutional: Negative for activity change, appetite change and fatigue.  HENT: Negative for hearing loss, congestion, tinnitus and ear discharge.  dentist q61m Eyes: Negative for visual disturbance (see optho q1y -- vision corrected to 20/20 with glasses).  Respiratory: Negative for cough, chest tightness and shortness of breath.   Cardiovascular: Negative for chest pain, palpitations and leg swelling.  Gastrointestinal: Negative for abdominal pain, diarrhea, constipation and abdominal distention.  Genitourinary: Negative for urgency, frequency, decreased urine volume and difficulty urinating.  Musculoskeletal: Negative for back pain, arthralgias and gait problem.  Skin: Negative for color change, pallor and rash.  Neurological: Negative for dizziness, light-headedness, numbness and headaches.  Hematological: Negative for adenopathy. Does not  bruise/bleed easily.  Psychiatric/Behavioral: Negative for suicidal ideas, confusion, sleep disturbance, self-injury, dysphoric mood, decreased concentration and agitation.       Objective:    BP 120/74  Pulse 104  Temp(Src) 97.9 F (36.6 C) (Oral)  Ht 5\' 8"  (1.727 m)  Wt 209 lb 9.6 oz (95.074 kg)  BMI 31.87 kg/m2  SpO2 96%  LMP 09/25/2011 General appearance: alert, cooperative, appears stated age and no distress Head: Normocephalic, without obvious abnormality, atraumatic Eyes: conjunctivae/corneas clear. PERRL, EOM's intact. Fundi benign. Ears: normal TM's and external ear canals both ears Nose: Nares normal. Septum midline. Mucosa normal. No drainage or sinus tenderness. Throat: lips, mucosa, and tongue normal; teeth and gums normal Neck: no adenopathy, no carotid bruit, no JVD, supple, symmetrical, trachea midline and thyroid not enlarged, symmetric, no tenderness/mass/nodules Back: symmetric, no curvature. ROM normal. No CVA tenderness. Lungs: clear to auscultation bilaterally Breasts: gyn Heart: regular rate and rhythm, S1, S2 normal, no murmur, click, rub or gallop Abdomen: soft, non-tender; bowel sounds normal; no masses,  no organomegaly Pelvic: gyn Extremities: extremities normal, atraumatic, no cyanosis or edema Pulses: 2+ and symmetric Skin: Skin color, texture, turgor normal. No rashes or lesions Lymph nodes: Cervical, supraclavicular, and axillary nodes normal. Neurologic: Alert and oriented X 3, normal strength and tone. Normal symmetric reflexes. Normal coordination and gait psych--- stable on meds    Assessment:    Healthy female exam. Depression---per psych    gerd-- refill omeprazole Plan:    ghm utd Check fasting labs Mammogram and pap per gyn See After Visit Summary for Counseling Recommendations

## 2011-09-27 NOTE — Patient Instructions (Signed)

## 2011-09-29 LAB — VITAMIN D 1,25 DIHYDROXY
Vitamin D 1, 25 (OH)2 Total: 80 pg/mL — ABNORMAL HIGH (ref 18–72)
Vitamin D2 1, 25 (OH)2: <8 pg/mL

## 2011-10-05 ENCOUNTER — Other Ambulatory Visit: Payer: Self-pay

## 2011-10-05 DIAGNOSIS — F32A Depression, unspecified: Secondary | ICD-10-CM

## 2011-10-05 DIAGNOSIS — F329 Major depressive disorder, single episode, unspecified: Secondary | ICD-10-CM

## 2011-10-05 MED ORDER — VENLAFAXINE HCL ER 75 MG PO CP24: 300.0000 mg | ORAL_CAPSULE | Freq: Every day | ORAL | Status: DC

## 2011-10-05 MED ORDER — CASANTHRANOL-DOCUSATE SODIUM 30-100 MG PO CAPS: 6.0000 | ORAL_CAPSULE | Freq: Every day | ORAL | Status: DC

## 2011-10-05 MED ORDER — ALPRAZOLAM 1 MG PO TABS: 1.0000 mg | ORAL_TABLET | Freq: Two times a day (BID) | ORAL | Status: DC

## 2011-10-05 MED ORDER — BUPROPION HCL ER (XL) 150 MG PO TB24: ORAL_TABLET | ORAL | Status: DC

## 2011-10-06 ENCOUNTER — Encounter: Payer: 59 | Admitting: Family Medicine

## 2011-12-07 ENCOUNTER — Encounter: Payer: Self-pay | Admitting: Family Medicine

## 2011-12-13 ENCOUNTER — Ambulatory Visit (INDEPENDENT_AMBULATORY_CARE_PROVIDER_SITE_OTHER): Payer: Self-pay | Admitting: Internal Medicine

## 2011-12-13 ENCOUNTER — Encounter: Payer: Self-pay | Admitting: Internal Medicine

## 2011-12-13 VITALS — BP 128/86 | HR 96 | Temp 99.0°F

## 2011-12-13 DIAGNOSIS — J0100 Acute maxillary sinusitis, unspecified: Secondary | ICD-10-CM

## 2011-12-13 DIAGNOSIS — J029 Acute pharyngitis, unspecified: Secondary | ICD-10-CM

## 2011-12-13 MED ORDER — AMOXICILLIN-POT CLAVULANATE 875-125 MG PO TABS: 1.0000 | ORAL_TABLET | Freq: Two times a day (BID) | ORAL | Status: AC

## 2011-12-13 NOTE — Patient Instructions (Signed)
Plain Mucinex for thick secretions ;force NON dairy fluids . Nasonex 1 spray in each nostril twice a day as needed. Use the "crossover" technique as discussed

## 2011-12-13 NOTE — Progress Notes (Signed)
  Subjective:    Patient ID: [name] [name], female    DOB: 1970/12/13, 41 y.o.   MRN: [id number]  HPI Respiratory tract infection Onset/symptoms:1/19 as ST Exposures (illness/environmental/extrinsic):no Progression of symptoms:to dry cough & R ear ache w/o discharge Treatments/response:Tylenol w/o help Present symptoms: Fever/chills/sweats:low grade@ night Frontal headache:no Facial pain:yes Nasal purulence:yellow Dental pain:no Lymphadenopathy:no Wheezing/shortness of breath:no Cough/sputum/hemoptysis:still NP Associated extrinsic/allergic symptoms:itchy eyes/ sneezing:no Smoking history:quit 2011          Review of Systems     Objective:   Physical Exam General appearance:good health ;well nourished; no acute distress or increased work of breathing is present.  No  lymphadenopathy about the head, neck, or axilla noted.   Eyes: No conjunctival inflammation or lid edema is present.   Ears:  External ear exam shows no significant lesions or deformities.  Otoscopic examination reveals clear canals, tympanic membranes are intact bilaterally without bulging, retraction, inflammation or discharge.  Nose:  External nasal examination shows no deformity or inflammation. Nasal mucosa are dry without lesions or exudates. No septal dislocation or deviation.No obstruction to airflow.  Slight hyponasal speech pattern  Oral exam: Dental hygiene is good; lips and gums are healthy appearing.There is no oropharyngeal erythema or exudate noted but tonsils enlarged.    Heart:  Normal rate and regular rhythm. S1 and S2 normal without gallop, murmur, click, rub or other extra sounds.   Lungs:Chest clear to auscultation; no wheezes, rhonchi,rales ,or rubs present.No increased work of breathing.    Extremities:  No cyanosis, edema, or clubbing  noted    Skin: Warm & dry           Assessment & Plan:    #1 upper respiratory track infection; acute with pharyngitis and sinusitis  symptomatology.  Plan: See orders and recommendations.

## 2011-12-18 ENCOUNTER — Other Ambulatory Visit: Payer: Self-pay

## 2011-12-18 MED ORDER — FLUCONAZOLE 150 MG PO TABS: 150.0000 mg | ORAL_TABLET | Freq: Once | ORAL | Status: DC

## 2011-12-18 NOTE — Telephone Encounter (Signed)
Per Dr.Hopper ok to send in Diflucan 150 mg x 1 and give 2 weeks worth of samples for Align for patient to take while on ABX to prevent Yeast Infection   Patient aware of MD instructions

## 2011-12-25 ENCOUNTER — Other Ambulatory Visit: Payer: Self-pay | Admitting: Family Medicine

## 2011-12-25 ENCOUNTER — Ambulatory Visit (INDEPENDENT_AMBULATORY_CARE_PROVIDER_SITE_OTHER): Payer: Self-pay | Admitting: Family Medicine

## 2011-12-25 ENCOUNTER — Encounter: Payer: Self-pay | Admitting: Family Medicine

## 2011-12-25 VITALS — BP 110/74 | HR 97 | Temp 99.0°F | Wt 213.0 lb

## 2011-12-25 DIAGNOSIS — N760 Acute vaginitis: Secondary | ICD-10-CM

## 2011-12-25 MED ORDER — TERCONAZOLE 0.8 % VA CREA: 1.0000 | TOPICAL_CREAM | Freq: Every day | VAGINAL | Status: AC

## 2011-12-25 MED ORDER — TERCONAZOLE 0.8 % VA CREA: 1.0000 | TOPICAL_CREAM | Freq: Every day | VAGINAL | Status: DC

## 2011-12-25 NOTE — Progress Notes (Signed)
  Subjective:     [name] [name] is a 41 y.o. female who was referred to me for evaluation and treatment of probable tinea. Lesion is located on the genital area with some d/c as well. Symptoms include rash located genital area and rectum White/yellow d/c. Symptoms have been ongoing for about since being on abx -. Previous visits for this problem: none. Previous evaluation and treatment has included oral antifungal per med orders: not very effective with poor improvement.   The following portions of the patient's history were reviewed and updated as appropriate: allergies, current medications, past family history, past medical history, past social history, past surgical history and problem list.  Review of Systems Pertinent items are noted in HPI.   Objective:    Physical Exam Locations:  vulvar and perirectally  Description:   inflamed, red  Lesion size:  Surrounding vulvar area and rectum  Other Findings:  postinflammatory hyperpigmentation  KOH:   not available  Woods lamp:  Not done   pelvic--- no vaginal d/c  Assessment:   Vaginitis  Plan:    1. terazol cream externally---no d/c   2. Verbal patient instruction given.  3. Follow up as needed for acute illness.

## 2011-12-25 NOTE — Patient Instructions (Signed)
Monilial Vaginitis Vaginitis in a soreness, swelling and redness (inflammation) of the vagina and vulva. Monilial vaginitis is not a sexually transmitted infection. CAUSES  Yeast vaginitis is caused by yeast (candida) that is normally found in your vagina. With a yeast infection, the candida has overgrown in number to a point that upsets the chemical balance. SYMPTOMS   White, thick vaginal discharge.   Swelling, itching, redness and irritation of the vagina and possibly the lips of the vagina (vulva).   Burning or painful urination.   Painful intercourse.  DIAGNOSIS  Things that may contribute to monilial vaginitis are:  Postmenopausal and virginal states.   Pregnancy.   Infections.   Being tired, sick or stressed, especially if you had monilial vaginitis in the past.   Diabetes. Good control will help lower the chance.   Birth control pills.   Tight fitting garments.   Using bubble bath, feminine sprays, douches or deodorant tampons.   Taking certain medications that kill germs (antibiotics).   Sporadic recurrence can occur if you become ill.  TREATMENT  Your caregiver will give you medication.  There are several kinds of anti monilial vaginal creams and suppositories specific for monilial vaginitis. For recurrent yeast infections, use a suppository or cream in the vagina 2 times a week, or as directed.   Anti-monilial or steroid cream for the itching or irritation of the vulva may also be used. Get your caregiver's permission.   Painting the vagina with methylene blue solution may help if the monilial cream does not work.   Eating yogurt may help prevent monilial vaginitis.  HOME CARE INSTRUCTIONS   Finish all medication as prescribed.   Do not have sex until treatment is completed or after your caregiver tells you it is okay.   Take warm sitz baths.   Do not douche.   Do not use tampons, especially scented ones.   Wear cotton underwear.   Avoid tight  pants and panty hose.   Tell your sexual partner that you have a yeast infection. They should go to their caregiver if they have symptoms such as mild rash or itching.   Your sexual partner should be treated as well if your infection is difficult to eliminate.   Practice safer sex. Use condoms.   Some vaginal medications cause latex condoms to fail. Vaginal medications that harm condoms are:   Cleocin cream.   Butoconazole (Femstat).   Terconazole (Terazol) vaginal suppository.   Miconazole (Monistat) (may be purchased over the counter).  SEEK MEDICAL CARE IF:   You have a temperature by mouth above 102 F (38.9 C).   The infection is getting worse after 2 days of treatment.   The infection is not getting better after 3 days of treatment.   You develop blisters in or around your vagina.   You develop vaginal bleeding, and it is not your menstrual period.   You have pain when you urinate.   You develop intestinal problems.   You have pain with sexual intercourse.  Document Released: 08/16/2005 Document Revised: 07/19/2011 Document Reviewed: 04/30/2009 Exit[employer] Patient Information 2012 [employer]. 

## 2011-12-25 NOTE — Progress Notes (Signed)
Addended by: Arnette Norris on: 12/25/2011 02:15 PM   Modules accepted: Orders

## 2011-12-27 LAB — WET PREP BY MOLECULAR PROBE
Candida species: NEGATIVE
Trichomonas vaginosis: NEGATIVE

## 2012-01-09 ENCOUNTER — Other Ambulatory Visit: Payer: Self-pay | Admitting: Family Medicine

## 2012-01-09 DIAGNOSIS — E669 Obesity, unspecified: Secondary | ICD-10-CM

## 2012-01-16 ENCOUNTER — Ambulatory Visit (INDEPENDENT_AMBULATORY_CARE_PROVIDER_SITE_OTHER): Payer: 59 | Admitting: Family Medicine

## 2012-01-16 ENCOUNTER — Encounter: Payer: Self-pay | Admitting: Family Medicine

## 2012-01-16 DIAGNOSIS — R079 Chest pain, unspecified: Secondary | ICD-10-CM

## 2012-01-16 NOTE — Progress Notes (Signed)
  Subjective:    [name] [name] is a 41 y.o. female who presents for evaluation of chest pain. Onset was 3 days ago. Symptoms have been unchanged since that time. The patient describes the pain as sharp and does not radiate.  . Associated symptoms are: chest pain and palpitations. Aggravating factors are: emotional stress. Alleviating factors are: none. Patient's cardiac risk factors are: smoking/ tobacco exposure. Patient's risk factors for DVT/PE: none. Previous cardiac testing: echocardiogram, electrocardiogram (ECG) and exercise thallium stress test normal.  The following portions of the patient's history were reviewed and updated as appropriate: allergies, current medications, past family history, past social history, past surgical history and problem list.  Review of Systems Pertinent items are noted in HPI.    Objective:    BP 134/90  Pulse 106  Temp(Src) 98.2 F (36.8 C) (Oral)  Wt 212 lb 6.4 oz (96.344 kg)  SpO2 97% General appearance: alert, cooperative, appears stated age and no distress Neck: no adenopathy and thyroid not enlarged, symmetric, no tenderness/mass/nodules Lungs: clear to auscultation bilaterally Heart: S1, S2 normal Extremities: extremities normal, atraumatic, no cyanosis or edema  Cardiographics ECG: normal sinus rhythm, no blocks or conduction defects, no ischemic changes  Imaging Chest x-ray: not indicated    Assessment:    Chest pain, suspected etiology: palpitations---hx anxiety   elevated BP Plan:    Patient history and exam consistent with non-cardiac cause of chest pain. Conservative measures indicated. OTC analgesics as needed. Worsening signs and symptoms discussed and patient verbalized understanding. con't current meds and rto if symptoms return --consider b blocker if symptoms persist Watch salt in diet

## 2012-01-16 NOTE — Assessment & Plan Note (Signed)
?   Anxiety although symptoms did not resolve with xanax 1 mg con't meds  taught pt how to check her pulse---- next time she feels pain or palpitations---check pulse--- may need to start b blocker--see cardio note

## 2012-02-08 ENCOUNTER — Ambulatory Visit: Payer: Self-pay | Admitting: *Deleted

## 2012-03-14 ENCOUNTER — Telehealth (INDEPENDENT_AMBULATORY_CARE_PROVIDER_SITE_OTHER): Payer: 59

## 2012-03-14 ENCOUNTER — Other Ambulatory Visit (INDEPENDENT_AMBULATORY_CARE_PROVIDER_SITE_OTHER): Payer: 59

## 2012-03-14 DIAGNOSIS — N200 Calculus of kidney: Secondary | ICD-10-CM

## 2012-03-14 DIAGNOSIS — R300 Dysuria: Secondary | ICD-10-CM

## 2012-03-14 DIAGNOSIS — R1011 Right upper quadrant pain: Secondary | ICD-10-CM

## 2012-03-14 LAB — POCT URINALYSIS DIPSTICK
Protein, UA: NEGATIVE
Spec Grav, UA: >=1.03
Urobilinogen, UA: 0.2
pH, UA: 6.5

## 2012-03-14 NOTE — Telephone Encounter (Addendum)
Patient has a Hx of kidney stones and is currently having right sided flank pain, dysuria and tenderness, she stated the pain has been increasing since this morning and radiating to the right side of the pelvic area. She is using a hot compress w/o relief. Patient wanted to know if she could get a UA. Please advise     KP

## 2012-03-14 NOTE — Telephone Encounter (Signed)
Patient aware     KP 

## 2012-03-14 NOTE — Telephone Encounter (Signed)
Urinalysis appropriate with along with a urine strainer. If she is having severe pain; Med [facility]

## 2012-03-14 NOTE — Progress Notes (Signed)
Addended by: Silvio Pate D on: 03/14/2012 05:07 PM   Modules accepted: Orders

## 2012-03-16 LAB — URINE CULTURE

## 2012-03-21 ENCOUNTER — Ambulatory Visit (INDEPENDENT_AMBULATORY_CARE_PROVIDER_SITE_OTHER): Payer: 59 | Admitting: Family Medicine

## 2012-03-21 ENCOUNTER — Encounter: Payer: Self-pay | Admitting: Family Medicine

## 2012-03-21 VITALS — BP 120/74 | HR 98 | Temp 98.1°F | Wt 210.2 lb

## 2012-03-21 DIAGNOSIS — R10A0 Flank pain, unspecified side: Secondary | ICD-10-CM

## 2012-03-21 DIAGNOSIS — R109 Unspecified abdominal pain: Secondary | ICD-10-CM

## 2012-03-21 DIAGNOSIS — N200 Calculus of kidney: Secondary | ICD-10-CM

## 2012-03-21 LAB — POCT URINALYSIS DIPSTICK
Bilirubin, UA: NEGATIVE
Nitrite, UA: NEGATIVE
pH, UA: 5

## 2012-03-21 LAB — POCT URINE PREGNANCY: Preg Test, Ur: NEGATIVE

## 2012-03-21 MED ORDER — HYDROCODONE-ACETAMINOPHEN 7.5-750 MG PO TABS: 1.0000 | ORAL_TABLET | Freq: Three times a day (TID) | ORAL | Status: AC | PRN

## 2012-03-21 NOTE — Progress Notes (Signed)
  Subjective:    [name] [name] is a 41 y.o. female who presents for evaluation of low back pain. The patient has had kidney stones in the past. Symptoms have been present for 3 days and are unchanged.  Onset was related to / precipitated by no known injury. The pain is located in the R flank and radiates to the abdomen. The pain is described as sharp and occurs intermittently. She rates her pain as severe. Symptoms are exacerbated by nothing in particular. Symptoms are improved by nothing. She has also tried nothing which provided no symptom relief. She has blood in the urine associated with the back pain. The patient has no "red flag" history indicative of complicated back pain.  The following portions of the patient's history were reviewed and updated as appropriate: allergies, current medications, past family history, past medical history, past social history, past surgical history and problem list.  Review of Systems Pertinent items are noted in HPI.    Objective:   Full range of motion without pain, no tenderness, no spasm, no curvature. Normal reflexes, gait, strength and negative straight-leg raise. Inspection and palpation: tenderness with palpation R flank.    Assessment:    kidney stones    Plan:    Natural history and expected course discussed. Questions answered. Heat to affected area as needed for local pain relief. vicodin for pain  Strain urine F/u urology

## 2012-03-21 NOTE — Patient Instructions (Signed)

## 2012-03-23 LAB — URINE CULTURE: Colony Count: 30000

## 2012-04-05 ENCOUNTER — Encounter: Payer: Self-pay | Admitting: Family Medicine

## 2012-04-05 ENCOUNTER — Ambulatory Visit (INDEPENDENT_AMBULATORY_CARE_PROVIDER_SITE_OTHER): Payer: 59 | Admitting: Family Medicine

## 2012-04-05 ENCOUNTER — Other Ambulatory Visit: Payer: Self-pay | Admitting: Gastroenterology

## 2012-04-05 VITALS — BP 134/88 | HR 112 | Wt 210.0 lb

## 2012-04-05 DIAGNOSIS — I498 Other specified cardiac arrhythmias: Secondary | ICD-10-CM

## 2012-04-05 DIAGNOSIS — R000 Tachycardia, unspecified: Secondary | ICD-10-CM

## 2012-04-05 DIAGNOSIS — R1031 Right lower quadrant pain: Secondary | ICD-10-CM

## 2012-04-05 DIAGNOSIS — I10 Essential (primary) hypertension: Secondary | ICD-10-CM

## 2012-04-05 DIAGNOSIS — R110 Nausea: Secondary | ICD-10-CM

## 2012-04-05 MED ORDER — METOPROLOL SUCCINATE ER 50 MG PO TB24: 50.0000 mg | ORAL_TABLET | Freq: Every day | ORAL | Status: DC

## 2012-04-05 NOTE — Patient Instructions (Signed)
Tachycardia, Nonspecific  In adults, the heart normally beats between 60 and 100 times a minute. A heart rate over 100 is called tachycardia. When your heart beats too fast, it may not be able to pump enough blood to the rest of the body.  CAUSES    Exercise or exertion.   Fever.   Pain or injury.   Infection.   Loss of fluid (dehydration).   Overactive thyroid.   Lack of red blood cells (anemia).   Anxiety.   Alcohol.   Heart arrhythmia.   Caffeine.   Tobacco products.   Diet pills.   Street drugs.   Heart disease.  SYMPTOMS   Palpitations (rapid or irregular heartbeat).   Dizziness.   Tiredness (fatigue).   Shortness of breath.  DIAGNOSIS   After an exam and taking a history, your caregiver may order:   Blood tests.   Electrocardiogram (EKG).   Heart monitor.  TREATMENT   Treatment will depend on the cause and potential for harm. It may include:   Intravenous (IV) replacement of fluids or blood.   Antidote or reversal medicines.   Changes in your present medicines.   Lifestyle changes.  HOME CARE INSTRUCTIONS    Get rest.   Drink enough water and fluids to keep your urine clear or pale yellow.   Avoid:   Caffeine.   Nicotine.   Alcohol.   Stress.   Chocolate.   Stimulants.   Only take medicine as directed by your caregiver.  SEEK IMMEDIATE MEDICAL CARE IF:    You have pain in your chest, upper arms, jaw, or neck.   You become weak, dizzy, or feel faint.   You have palpitations that will not go away.   You throw up (vomit), have diarrhea, or pass blood.   You look pale and your skin is cool and wet.  MAKE SURE YOU:    Understand these instructions.   Will watch your condition.   Will get help right away if you are not doing well or get worse.  Document Released: 12/14/2004 Document Revised: 10/26/2011 Document Reviewed: 10/17/2011  ExitCare Patient Information 2012 [employer].

## 2012-04-07 DIAGNOSIS — R000 Tachycardia, unspecified: Secondary | ICD-10-CM | POA: Insufficient documentation

## 2012-04-07 DIAGNOSIS — I10 Essential (primary) hypertension: Secondary | ICD-10-CM | POA: Insufficient documentation

## 2012-04-07 NOTE — Assessment & Plan Note (Signed)
toprol xl  rto 2-3 weeks or sooner prn

## 2012-04-07 NOTE — Progress Notes (Signed)
  Subjective:    Patient ID: [name] [name], female    DOB: [birth year], 41 y.o.   MRN: [id number]  HPIPt c/o racing heart beat.  b blockers offered to her in the past but the palpatations have become more frequent lately.  She states her heart rate has been in the 120s since last night and won't slow down.  No SoB or chest pain.       Review of Systems As above    Objective:   Physical Exam  Constitutional: She is oriented to person, place, and time. She appears well-developed and well-nourished. No distress.  Neck: Normal range of motion. Neck supple.  Cardiovascular: Normal rate, regular rhythm and normal heart sounds.   No murmur heard. Pulmonary/Chest: Effort normal and breath sounds normal.  Neurological: She is alert and oriented to person, place, and time.  Skin: She is not diaphoretic.  Psychiatric: She has a normal mood and affect. Her behavior is normal. Judgment and thought content normal.  EKG-- sinus tachy        Assessment & Plan:

## 2012-04-07 NOTE — Assessment & Plan Note (Signed)
toprol xl qd  rto 2-3 weeks

## 2012-04-11 ENCOUNTER — Other Ambulatory Visit (HOSPITAL_COMMUNITY): Payer: 59

## 2012-04-16 ENCOUNTER — Inpatient Hospital Stay (HOSPITAL_COMMUNITY): Admission: RE | Admit: 2012-04-16 | Payer: 59 | Source: Ambulatory Visit

## 2012-04-17 ENCOUNTER — Other Ambulatory Visit: Payer: Self-pay | Admitting: Gastroenterology

## 2012-04-17 ENCOUNTER — Ambulatory Visit (HOSPITAL_COMMUNITY)
Admission: RE | Admit: 2012-04-17 | Discharge: 2012-04-17 | Disposition: A | Discharge status: Home / Self Care | Payer: 59 | Source: Ambulatory Visit | Attending: Gastroenterology | Admitting: Gastroenterology

## 2012-04-17 DIAGNOSIS — R1031 Right lower quadrant pain: Secondary | ICD-10-CM

## 2012-04-17 DIAGNOSIS — R110 Nausea: Secondary | ICD-10-CM

## 2012-04-17 DIAGNOSIS — K7689 Other specified diseases of liver: Secondary | ICD-10-CM | POA: Insufficient documentation

## 2012-04-17 DIAGNOSIS — N200 Calculus of kidney: Secondary | ICD-10-CM | POA: Insufficient documentation

## 2012-04-17 MED ORDER — IOHEXOL 300 MG/ML  SOLN
100.0000 mL | Freq: Once | INTRAMUSCULAR | Status: AC | PRN
  Administered 2012-04-17: 100 mL via INTRAVENOUS

## 2012-05-03 ENCOUNTER — Ambulatory Visit (INDEPENDENT_AMBULATORY_CARE_PROVIDER_SITE_OTHER): Payer: 59 | Admitting: Family Medicine

## 2012-05-03 ENCOUNTER — Encounter: Payer: Self-pay | Admitting: Family Medicine

## 2012-05-03 VITALS — BP 114/80 | HR 84 | Temp 98.4°F | Wt 211.4 lb

## 2012-05-03 DIAGNOSIS — F3289 Other specified depressive episodes: Secondary | ICD-10-CM

## 2012-05-03 DIAGNOSIS — F329 Major depressive disorder, single episode, unspecified: Secondary | ICD-10-CM

## 2012-05-03 DIAGNOSIS — F32A Depression, unspecified: Secondary | ICD-10-CM

## 2012-05-03 DIAGNOSIS — T7491XA Unspecified adult maltreatment, confirmed, initial encounter: Secondary | ICD-10-CM
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
## 2012-06-03 ENCOUNTER — Encounter: Payer: Self-pay | Admitting: Family Medicine

## 2012-06-03 ENCOUNTER — Ambulatory Visit (INDEPENDENT_AMBULATORY_CARE_PROVIDER_SITE_OTHER): Payer: 59 | Admitting: Family Medicine

## 2012-06-03 VITALS — BP 118/72 | HR 109 | Wt 211.0 lb

## 2012-06-03 DIAGNOSIS — N390 Urinary tract infection, site not specified: Secondary | ICD-10-CM

## 2012-06-03 DIAGNOSIS — R109 Unspecified abdominal pain: Secondary | ICD-10-CM

## 2012-06-03 DIAGNOSIS — R1011 Right upper quadrant pain: Secondary | ICD-10-CM

## 2012-06-03 DIAGNOSIS — R10A1 Flank pain, right side: Secondary | ICD-10-CM

## 2012-06-03 LAB — POCT URINALYSIS DIPSTICK
Ketones, UA: NEGATIVE
Protein, UA: NEGATIVE
Spec Grav, UA: 1.02
pH, UA: 6

## 2012-06-03 LAB — CBC WITH DIFFERENTIAL/PLATELET
Basophils Relative: 0.5 % (ref 0.0–3.0)
Eosinophils Relative: 3.3 % (ref 0.0–5.0)
HCT: 38.9 % (ref 36.0–46.0)
Lymphs Abs: 2.6 10*3/uL (ref 0.7–4.0)
MCV: 85 fl (ref 78.0–100.0)
Monocytes Absolute: 0.5 10*3/uL (ref 0.1–1.0)
Neutro Abs: 5.1 10*3/uL (ref 1.4–7.7)
Platelets: 324 10*3/uL (ref 150.0–400.0)
WBC: 8.6 10*3/uL (ref 4.5–10.5)

## 2012-06-03 LAB — BASIC METABOLIC PANEL
BUN: 16 mg/dL (ref 6–23)
Chloride: 102 mEq/L (ref 96–112)
Potassium: 3.6 mEq/L (ref 3.5–5.1)

## 2012-06-03 LAB — HEPATIC FUNCTION PANEL
ALT: 18 U/L (ref 0–35)
Albumin: 4 g/dL (ref 3.5–5.2)
Total Bilirubin: 0.3 mg/dL (ref 0.3–1.2)
Total Protein: 7.5 g/dL (ref 6.0–8.3)

## 2012-06-03 LAB — LIPASE: Lipase: 30 U/L (ref 11.0–59.0)

## 2012-06-03 LAB — AMYLASE: Amylase: 52 U/L (ref 27–131)

## 2012-06-03 NOTE — Progress Notes (Signed)
  Subjective:    Patient ID: [name] [name], female    DOB: February 15, 1971, 40 y.o.   MRN: [id number]  HPI Pt here c/o RUQ pain that had subsided some but has returned.   No fever, no urinary symptoms.      Review of Systems as above   Objective:   Physical Exam  Constitutional: She is oriented to person, place, and time. She appears well-developed and well-nourished.  Pulmonary/Chest: Effort normal and breath sounds normal.  Abdominal: She exhibits no distension. There is tenderness. There is guarding. There is no rebound.        RUQ pain with palpation  no rebound  Neurological: She is alert and oriented to person, place, and time.  Psychiatric: She has a normal mood and affect. Her behavior is normal. Judgment and thought content normal.          Assessment & Plan:

## 2012-06-03 NOTE — Patient Instructions (Addendum)

## 2012-06-03 NOTE — Assessment & Plan Note (Signed)
[place] abd Check labs  ? hida

## 2012-06-05 LAB — URINE CULTURE: Colony Count: NO GROWTH

## 2012-06-06 ENCOUNTER — Ambulatory Visit (HOSPITAL_BASED_OUTPATIENT_CLINIC_OR_DEPARTMENT_OTHER): Payer: 59

## 2012-06-07 ENCOUNTER — Other Ambulatory Visit (HOSPITAL_BASED_OUTPATIENT_CLINIC_OR_DEPARTMENT_OTHER): Payer: 59

## 2012-06-10 ENCOUNTER — Ambulatory Visit (HOSPITAL_BASED_OUTPATIENT_CLINIC_OR_DEPARTMENT_OTHER)
Admission: RE | Admit: 2012-06-10 | Discharge: 2012-06-10 | Disposition: A | Discharge status: Home / Self Care | Payer: 59 | Source: Ambulatory Visit | Attending: Family Medicine | Admitting: Family Medicine

## 2012-06-10 DIAGNOSIS — R109 Unspecified abdominal pain: Secondary | ICD-10-CM | POA: Insufficient documentation

## 2012-06-10 DIAGNOSIS — R1011 Right upper quadrant pain: Secondary | ICD-10-CM

## 2012-06-10 DIAGNOSIS — N200 Calculus of kidney: Secondary | ICD-10-CM | POA: Insufficient documentation

## 2012-06-10 DIAGNOSIS — K7689 Other specified diseases of liver: Secondary | ICD-10-CM | POA: Insufficient documentation

## 2012-06-17 ENCOUNTER — Other Ambulatory Visit: Payer: Self-pay | Admitting: Family Medicine

## 2012-06-17 DIAGNOSIS — R1011 Right upper quadrant pain: Secondary | ICD-10-CM

## 2012-06-17 NOTE — Addendum Note (Signed)
Addended by: Arnette Norris on: 06/17/2012 09:17 AM   Modules accepted: Orders

## 2012-07-01 ENCOUNTER — Encounter (HOSPITAL_COMMUNITY): Payer: Self-pay

## 2012-07-01 ENCOUNTER — Encounter (HOSPITAL_COMMUNITY)
Admission: RE | Admit: 2012-07-01 | Discharge: 2012-07-01 | Disposition: A | Discharge status: Home / Self Care | Payer: 59 | Source: Ambulatory Visit | Attending: Family Medicine | Admitting: Family Medicine

## 2012-07-01 DIAGNOSIS — R1011 Right upper quadrant pain: Secondary | ICD-10-CM | POA: Insufficient documentation

## 2012-07-01 MED ORDER — TECHNETIUM TC 99M MEBROFENIN IV KIT
5.0000 | PACK | Freq: Once | INTRAVENOUS | Status: AC | PRN
  Administered 2012-07-01: 5 via INTRAVENOUS

## 2012-07-16 ENCOUNTER — Encounter (HOSPITAL_COMMUNITY): Payer: Self-pay | Admitting: Pharmacist

## 2012-07-23 ENCOUNTER — Encounter (HOSPITAL_COMMUNITY): Payer: Self-pay

## 2012-07-23 ENCOUNTER — Encounter (HOSPITAL_COMMUNITY)
Admission: RE | Admit: 2012-07-23 | Discharge: 2012-07-23 | Disposition: A | Discharge status: Home / Self Care | Payer: 59 | Source: Ambulatory Visit | Attending: Obstetrics and Gynecology | Admitting: Obstetrics and Gynecology

## 2012-07-23 HISTORY — DX: Anxiety disorder, unspecified: F41.9

## 2012-07-23 HISTORY — DX: Unspecified osteoarthritis, unspecified site: M19.90

## 2012-07-23 HISTORY — DX: Tachycardia, unspecified: R00.0

## 2012-07-23 LAB — BASIC METABOLIC PANEL
BUN: 13 mg/dL (ref 6–23)
CO2: 25 mEq/L (ref 19–32)
Calcium: 9.1 mg/dL (ref 8.4–10.5)
Chloride: 102 mEq/L (ref 96–112)
Creatinine, Ser: 0.88 mg/dL (ref 0.50–1.10)
GFR calc Af Amer: >90 mL/min (ref >90)

## 2012-07-23 LAB — CBC
HCT: 41.2 % (ref 36.0–46.0)
MCHC: 31.3 g/dL (ref 30.0–36.0)
MCV: 85.8 fL (ref 78.0–100.0)
RDW: 13.2 % (ref 11.5–15.5)

## 2012-07-23 LAB — SURGICAL PCR SCREEN: MRSA, PCR: NEGATIVE

## 2012-07-23 NOTE — Patient Instructions (Addendum)
   Your procedure is scheduled ZO:XWRUEAV, Sept 10th  Enter through the Main Entrance of [facility] at: 6 am Pick up the phone at the desk and dial [phone] and inform us of your arrival.  Please call this number if you have any problems the morning of surgery: [phone]  Remember: Do not eat food after midnight: Monday Do not drink clear liquids after: Monday Take these medicines the morning of surgery with a SIP OF WATER: xanax, wellbutrin, effexor xl, prilosec, and toprol xl per dr Brayton Caves  Do not wear jewelry, make-up, or FINGER nail polish No metal in your hair or on your body. Do not wear lotions, powders, perfumes or deodorant. Do not shave 48 hours prior to surgery. Do not bring valuables to the hospital. Contacts, dentures or bridgework may not be worn into surgery.  Patients discharged on the day of surgery will not be allowed to drive home.   Home with father Lewie Chamber.   Remember to use your hibiclens as instructed.Please shower with 1/2 bottle the evening before your surgery and the other 1/2 bottle the morning of surgery. Neck down avoiding private area.

## 2012-07-23 NOTE — Pre-Procedure Instructions (Signed)
Per Dr Brayton Caves, if EKG is normal today, ok for surgery. Patient takes toprol xl for occasional tachycardia.
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
## 2012-07-30 ENCOUNTER — Encounter (HOSPITAL_COMMUNITY)
Admission: AD | Disposition: A | Discharge status: Home / Self Care | Payer: Self-pay | Source: Ambulatory Visit | Attending: Obstetrics and Gynecology

## 2012-07-30 ENCOUNTER — Ambulatory Visit (HOSPITAL_COMMUNITY): Payer: 59 | Admitting: Anesthesiology

## 2012-07-30 ENCOUNTER — Encounter (HOSPITAL_COMMUNITY): Payer: Self-pay | Admitting: Anesthesiology

## 2012-07-30 ENCOUNTER — Encounter (HOSPITAL_COMMUNITY): Payer: Self-pay | Admitting: *Deleted

## 2012-07-30 ENCOUNTER — Ambulatory Visit (HOSPITAL_COMMUNITY)
Admission: AD | Admit: 2012-07-30 | Discharge: 2012-07-30 | Disposition: A | Discharge status: Home / Self Care | Payer: 59 | Source: Ambulatory Visit | Attending: Obstetrics and Gynecology | Admitting: Obstetrics and Gynecology

## 2012-07-30 DIAGNOSIS — N949 Unspecified condition associated with female genital organs and menstrual cycle: Secondary | ICD-10-CM | POA: Insufficient documentation

## 2012-07-30 DIAGNOSIS — R102 Pelvic and perineal pain: Secondary | ICD-10-CM

## 2012-07-30 HISTORY — PX: LAPAROSCOPY: SHX197

## 2012-07-30 SURGERY — LAPAROSCOPY, DIAGNOSTIC
Anesthesia: General | Site: Abdomen | Wound class: Clean Contaminated

## 2012-07-30 MED ORDER — NEOSTIGMINE METHYLSULFATE 1 MG/ML IJ SOLN
INTRAMUSCULAR | Status: AC
  Filled 2012-07-30: qty 10

## 2012-07-30 MED ORDER — LIDOCAINE HCL (CARDIAC) 20 MG/ML IV SOLN
INTRAVENOUS | Status: AC
  Filled 2012-07-30: qty 5

## 2012-07-30 MED ORDER — GLYCOPYRROLATE 0.2 MG/ML IJ SOLN
INTRAMUSCULAR | Status: DC | PRN
  Administered 2012-07-30: 0.2 mg via INTRAVENOUS

## 2012-07-30 MED ORDER — OXYCODONE-ACETAMINOPHEN 5-325 MG PO TABS: 1.0000 | ORAL_TABLET | ORAL | Status: AC | PRN

## 2012-07-30 MED ORDER — BUPIVACAINE HCL (PF) 0.25 % IJ SOLN
INTRAMUSCULAR | Status: AC
Start: 2012-07-30 — End: 2012-07-30
  Filled 2012-07-30: qty 30

## 2012-07-30 MED ORDER — LACTATED RINGERS IV SOLN
INTRAVENOUS | Status: DC
  Administered 2012-07-30: 125 mL/h via INTRAVENOUS
  Administered 2012-07-30 (×2): via INTRAVENOUS

## 2012-07-30 MED ORDER — PHENYLEPHRINE 40 MCG/ML (10ML) SYRINGE FOR IV PUSH (FOR BLOOD PRESSURE SUPPORT)
PREFILLED_SYRINGE | INTRAVENOUS | Status: AC
  Filled 2012-07-30: qty 5

## 2012-07-30 MED ORDER — ONDANSETRON HCL 4 MG/2ML IJ SOLN
INTRAMUSCULAR | Status: AC
  Filled 2012-07-30: qty 2

## 2012-07-30 MED ORDER — GLYCOPYRROLATE 0.2 MG/ML IJ SOLN
INTRAMUSCULAR | Status: AC
  Filled 2012-07-30: qty 1

## 2012-07-30 MED ORDER — PROPOFOL 10 MG/ML IV EMUL
INTRAVENOUS | Status: AC
  Filled 2012-07-30: qty 20

## 2012-07-30 MED ORDER — MIDAZOLAM HCL 5 MG/5ML IJ SOLN
INTRAMUSCULAR | Status: DC | PRN
  Administered 2012-07-30: 2 mg via INTRAVENOUS

## 2012-07-30 MED ORDER — ROCURONIUM BROMIDE 100 MG/10ML IV SOLN
INTRAVENOUS | Status: DC | PRN
  Administered 2012-07-30: 10 mg via INTRAVENOUS
  Administered 2012-07-30: 30 mg via INTRAVENOUS

## 2012-07-30 MED ORDER — FENTANYL CITRATE 0.05 MG/ML IJ SOLN
INTRAMUSCULAR | Status: AC
  Filled 2012-07-30: qty 5

## 2012-07-30 MED ORDER — FENTANYL CITRATE 0.05 MG/ML IJ SOLN: 25.0000 ug | INTRAMUSCULAR | Status: DC | PRN

## 2012-07-30 MED ORDER — NEOSTIGMINE METHYLSULFATE 1 MG/ML IJ SOLN
INTRAMUSCULAR | Status: DC | PRN
  Administered 2012-07-30: 3 mg via INTRAVENOUS

## 2012-07-30 MED ORDER — BUPIVACAINE HCL (PF) 0.25 % IJ SOLN
INTRAMUSCULAR | Status: DC | PRN
  Administered 2012-07-30: 8 mL

## 2012-07-30 MED ORDER — LACTATED RINGERS IV SOLN: INTRAVENOUS | Status: DC

## 2012-07-30 MED ORDER — ONDANSETRON HCL 4 MG/2ML IJ SOLN
INTRAMUSCULAR | Status: DC | PRN
  Administered 2012-07-30: 4 mg via INTRAVENOUS

## 2012-07-30 MED ORDER — PROPOFOL 10 MG/ML IV EMUL
INTRAVENOUS | Status: DC | PRN
  Administered 2012-07-30: 50 mg via INTRAVENOUS
  Administered 2012-07-30: 150 mg via INTRAVENOUS

## 2012-07-30 MED ORDER — LIDOCAINE HCL (CARDIAC) 20 MG/ML IV SOLN
INTRAVENOUS | Status: DC | PRN
  Administered 2012-07-30: 50 mg via INTRAVENOUS

## 2012-07-30 MED ORDER — DEXAMETHASONE SODIUM PHOSPHATE 10 MG/ML IJ SOLN
INTRAMUSCULAR | Status: DC | PRN
  Administered 2012-07-30: 10 mg via INTRAVENOUS

## 2012-07-30 MED ORDER — FENTANYL CITRATE 0.05 MG/ML IJ SOLN
INTRAMUSCULAR | Status: DC | PRN
  Administered 2012-07-30: 50 ug via INTRAVENOUS
  Administered 2012-07-30: 100 ug via INTRAVENOUS

## 2012-07-30 MED ORDER — MIDAZOLAM HCL 2 MG/2ML IJ SOLN
INTRAMUSCULAR | Status: AC
  Filled 2012-07-30: qty 2

## 2012-07-30 MED ORDER — ROCURONIUM BROMIDE 50 MG/5ML IV SOLN
INTRAVENOUS | Status: AC
  Filled 2012-07-30: qty 1

## 2012-07-30 MED ORDER — PHENYLEPHRINE HCL 10 MG/ML IJ SOLN
INTRAMUSCULAR | Status: DC | PRN
  Administered 2012-07-30: 80 ug via INTRAVENOUS

## 2012-07-30 SURGICAL SUPPLY — 18 items
CATH ROBINSON RED A/P 16FR (CATHETERS) ×3 IMPLANT
CHLORAPREP W/TINT 26ML (MISCELLANEOUS) ×3 IMPLANT
CLOTH BEACON ORANGE TIMEOUT ST (SAFETY) ×3 IMPLANT
DERMABOND ADVANCED (GAUZE/BANDAGES/DRESSINGS) ×1
DERMABOND ADVANCED .7 DNX12 (GAUZE/BANDAGES/DRESSINGS) ×2 IMPLANT
GLOVE BIO SURGEON STRL SZ 6.5 (GLOVE) ×6 IMPLANT
GOWN PREVENTION PLUS LG XLONG (DISPOSABLE) ×6 IMPLANT
NS IRRIG 1000ML POUR BTL (IV SOLUTION) ×3 IMPLANT
PACK LAPAROSCOPY BASIN (CUSTOM PROCEDURE TRAY) ×3 IMPLANT
PROTECTOR NERVE ULNAR (MISCELLANEOUS) ×3 IMPLANT
SUT VIC AB 3-0 PS2 18 (SUTURE) ×3
SUT VIC AB 3-0 PS2 18XBRD (SUTURE) ×2 IMPLANT
SUT VICRYL 0 UR6 27IN ABS (SUTURE) ×6 IMPLANT
TOWEL OR 17X24 6PK STRL BLUE (TOWEL DISPOSABLE) ×6 IMPLANT
TROCAR BALLN 12MMX100 BLUNT (TROCAR) ×3 IMPLANT
TROCAR Z-THREAD FIOS 5X100MM (TROCAR) ×3 IMPLANT
WARMER LAPAROSCOPE (MISCELLANEOUS) ×3 IMPLANT
WATER STERILE IRR 1000ML POUR (IV SOLUTION) ×3 IMPLANT

## 2012-07-30 NOTE — Anesthesia Postprocedure Evaluation (Signed)
Anesthesia Post Note  Patient: [name] [name]  Procedure(s) Performed: Procedure(s) (LRB): LAPAROSCOPY DIAGNOSTIC (N/A)  Anesthesia type: General  Patient location: PACU  Post pain: Pain level controlled  Post assessment: Post-op Vital signs reviewed  Last Vitals:  Filed Vitals:   07/30/12 0900  BP: 118/73  Pulse: 77  Temp:   Resp: 15    Post vital signs: Reviewed  Level of consciousness: sedated  Complications: No apparent anesthesia complicationsfj

## 2012-07-30 NOTE — Transfer of Care (Signed)
Immediate Anesthesia Transfer of Care Note  Patient: [name] [name]  Procedure(s) Performed: Procedure(s) (LRB) with comments: LAPAROSCOPY DIAGNOSTIC (N/A)  Patient Location: PACU  Anesthesia Type: General  Level of Consciousness: sedated  Airway & Oxygen Therapy: Patient Spontanous Breathing and Patient connected to nasal cannula oxygen  Post-op Assessment: Report given to PACU RN  Post vital signs: Reviewed and stable  Complications: No apparent anesthesia complications

## 2012-07-30 NOTE — Brief Op Note (Signed)
07/30/2012  8:09 AM  PATIENT:  [name] [name]  41 y.o. female  PRE-OPERATIVE DIAGNOSIS:  Pelvic Pain  POST-OPERATIVE DIAGNOSIS:  Pelvic Pain  PROCEDURE:  Procedure(s) (LRB) with comments: LAPAROSCOPY DIAGNOSTIC (N/A)  SURGEON:  Surgeon(s) and Role:    * Oliver Pila, MD - Primary   ANESTHESIA:   local and epidural  EBL:  Total I/O In: 2000 [I.V.:2000] Out: 60 [Urine:50; Blood:10]  BLOOD ADMINISTERED:none  DRAINS: none   LOCAL MEDICATIONS USED:  MARCAINE     SPECIMEN:  No Specimen  DISPOSITION OF SPECIMEN:  N/A  COUNTS:  YES  TOURNIQUET:  * No tourniquets in log *  DICTATION: .Dragon Dictation  PLAN OF CARE: Discharge to home after PACU  PATIENT DISPOSITION:  PACU - hemodynamically stable.

## 2012-07-30 NOTE — Op Note (Signed)
Operative note  Preoperative diagnosis Persistent pelvic pain Prior laparoscopy x3  Postoperative diagnosis Same  Procedure Open diagnostic laparoscopy  Surgeon Dr. Huel Cote  Anesthesia Gen. Local Marcaine at incision site  Fluids Estimated blood loss less than 50 cc Urine output 50 cc cc straight catheter prior procedure IV fluids 800 cc LR  Findings Normal pelvic anatomy was noted the ovaries appeared normal in size and were freely mobile. The cul-de-sac was clear the uterus was normal in size. There was no evidence of endometriosis or adhesions. The appendix appeared normal the liver edge was normal and the gallbladder visible appearing normal in size. There was a small corpus luteum cyst on the left ovary.  Specimen None  Procedure Patient was taken to the operating room after informed consent was obtained. She was then prepped and draped in the normal sterile fashion in the dorsal lithotomy position. An appropriate time out was performed. The bladder was emptied with a straight catheter. A speculum was placed within the vagina and a Hulka tenaculum introduced into the uterus for uterine manipulation. Attention was turned to the patient's abdomen where the infraumbilical area was injected with approximately 8 cc of quarter percent Marcaine. 2 Allis clamps were utilized to grasp approximately 1 cm area and elevate this was then incised sharply and the incision carried down to the layer of fascia by Mayo scissors. And the fascia was then grasped in 2 Allis clamps elevated and opened with the Mayo scissors. The peritoneum itself was then entered bluntly. The [employer] retractors were then placed within the incision and it was elevated with peritoneal cavity clearly visible below. A pursestring suture of 0 Vicryl was then placed around the fascial edge. The [facility] trocar was then introduced into the incision and the fascia suture secured around the trocar. The trocar was  inflated at the collar and the camera introduced. The patient was placed in Trendelenburg and gas flow obtained and pneumoperitoneum. The abdomen and pelvis were carefully inspected in all areas. There was no significant scar tissue or endometriosis noted the ovaries appeared normal as well. The cul-de-sac was inspected and was free of endometriosis. The appendix was clearly visible and normal in size. The liver edge and gallbladder were also inspected and appeared normal. Pictures were taken for the patient and the camera then removed. Pneumoperitoneum was reduced through the [facility] which was then deflated and removed. The umbilical port was then closed with a pursestring suture. An additional deep suture was placed in the sub-cutaneous tissue to close the potential space. The skin was closed with 3-0 Vicryl in a subcuticular stitch and Dermabond. All sponge lap and needle counts were correct. The Hulka tenaculum was removed from the vagina and the patient was taken to the recovery room in good condition.

## 2012-07-30 NOTE — Anesthesia Preprocedure Evaluation (Signed)
Anesthesia Evaluation  Patient identified by MRN, date of birth, ID band Patient awake    Reviewed: Allergy & Precautions, H&P , NPO status , Patient's Chart, lab work & pertinent test results, reviewed documented beta blocker date and time   History of Anesthesia Complications Negative for: history of anesthetic complications  Airway Mallampati: I TM Distance: >3 FB Neck ROM: full    Dental  (+)    Pulmonary neg sleep apnea (pt thinks she has it, but had a negative sleep study), former smoker,  breath sounds clear to auscultation  Pulmonary exam normal       Cardiovascular Exercise Tolerance: Good + dysrhythmias (sinus tachycardia, takes toprol prn, took today) Rhythm:regular Rate:Normal  Occasional chest pain, EKG have been normal, CP attributed to anxiety   Neuro/Psych PSYCHIATRIC DISORDERS (depression/anxiety) negative neurological ROS  negative psych ROS   GI/Hepatic GERD-  Medicated and Controlled,Fatty liver disease - per pt "not causing problems"   Endo/Other  PCOS, obese  Renal/GU Renal disease (kidney stones)  Female GU complaint     Musculoskeletal   Abdominal   Peds  Hematology negative hematology ROS (+)   Anesthesia Other Findings   Reproductive/Obstetrics negative OB ROS                           Anesthesia Physical Anesthesia Plan  ASA: III  Anesthesia Plan: General ETT   Post-op Pain Management:    Induction:   Airway Management Planned:   Additional Equipment:   Intra-op Plan:   Post-operative Plan:   Informed Consent: I have reviewed the patients History and Physical, chart, labs and discussed the procedure including the risks, benefits and alternatives for the proposed anesthesia with the patient or authorized representative who has indicated his/her understanding and acceptance.   Dental Advisory Given  Plan Discussed with: CRNA and Surgeon  Anesthesia  Plan Comments:         Anesthesia Quick Evaluation

## 2012-07-30 NOTE — Progress Notes (Signed)
Patient ID: [name] [name], female   DOB: 06-Jan-1971, 41 y.o.   MRN: [id number] Pt presents ready for surgery, no changes in H&P.  Brief exam WNL.  WIll proceed to OR.

## 2012-07-31 ENCOUNTER — Encounter (HOSPITAL_COMMUNITY): Payer: Self-pay | Admitting: Obstetrics and Gynecology

## 2012-08-14 ENCOUNTER — Ambulatory Visit (INDEPENDENT_AMBULATORY_CARE_PROVIDER_SITE_OTHER): Payer: 59 | Admitting: Family Medicine

## 2012-08-14 ENCOUNTER — Encounter: Payer: Self-pay | Admitting: Family Medicine

## 2012-08-14 VITALS — BP 130/80 | HR 83 | Temp 98.3°F | Wt 218.8 lb

## 2012-08-14 DIAGNOSIS — L732 Hidradenitis suppurativa: Secondary | ICD-10-CM

## 2012-08-14 MED ORDER — DOXYCYCLINE HYCLATE 100 MG PO TABS: 100.0000 mg | ORAL_TABLET | Freq: Two times a day (BID) | ORAL | Status: DC

## 2012-08-14 NOTE — Progress Notes (Signed)
  Subjective:    Patient ID: [name] [name], female    DOB: 05-05-1971, 41 y.o.   MRN: [id number]  HPI Pt here c/o reccurrent abscess on abd, groin and buttocks.  They usually drain on their own and go away but she has some now that are painful.    Review of Systems    as above Objective:   Physical Exam  Constitutional: She is oriented to person, place, and time. She appears well-developed and well-nourished.  Neurological: She is alert and oriented to person, place, and time.  Skin:       + abscess L groin-- no drainage, tender to touch Another R buttock  Psychiatric: She has a normal mood and affect. Her behavior is normal.          Assessment & Plan:  hidrenitis---  Doxy for 14 days                    Warm epson baths                   To surgeon if no better

## 2012-08-14 NOTE — Patient Instructions (Signed)
Hidradenitis Suppurativa, Sweat Gland Abscess Hidradenitis suppurativa is a long lasting (chronic), uncommon disease of the sweat glands. With this, boil-like lumps and scarring develop in the groin, some times under the arms (axillae), and under the breasts. It may also uncommonly occur behind the ears, in the crease of the buttocks, and around the genitals.  CAUSES  The cause is from a blocking of the sweat glands. They then become infected. It may cause drainage and odor. It is not contagious. So it cannot be given to someone else. It most often shows up in puberty (about 10 to 39 years of age). But it may happen much later. It is similar to acne which is a disease of the sweat glands. This condition is slightly more common in African-Americans and women. SYMPTOMS   Hidradenitis usually starts as one or more red, tender, swellings in the groin or under the arms (axilla).   Over a period of hours to days the lesions get larger. They often open to the skin surface, draining clear to yellow-colored fluid.   The infected area heals with scarring.  DIAGNOSIS  Your caregiver makes this diagnosis by looking at you. Sometimes cultures (growing germs on plates in the lab) may be taken. This is to see what germ (bacterium) is causing the infection.  TREATMENT   Topical germ killing medicine applied to the skin (antibiotics) are the treatment of choice. Antibiotics taken by mouth (systemic) are sometimes needed when the condition is getting worse or is severe.   Avoid tight-fitting clothing which traps moisture in.   Dirt does not cause hidradenitis and it is not caused by poor hygiene.   Involved areas should be cleaned daily using an antibacterial soap. Some patients find that the liquid form of Lever 2000, applied to the involved areas as a lotion after bathing, can help reduce the odor related to this condition.   Sometimes surgery is needed to drain infected areas or remove scarred tissue.  Removal of large amounts of tissue is used only in severe cases.   Birth control pills may be helpful.   Oral retinoids (vitamin A derivatives) for 6 to 12 months which are effective for acne may also help this condition.   Weight loss will improve but not cure hidradenitis. It is made worse by being overweight. But the condition is not caused by being overweight.   This condition is more common in people who have had acne.   It may become worse under stress.  There is no medical cure for hidradenitis. It can be controlled, but not cured. The condition usually continues for years with periods of getting worse and getting better (remission). Document Released: 06/20/2004 Document Revised: 10/26/2011 Document Reviewed: 07/06/2008 Exit[employer] Patient Information 2012 [employer]. 

## 2012-08-15 ENCOUNTER — Ambulatory Visit: Payer: 59 | Admitting: Family Medicine

## 2012-08-20 ENCOUNTER — Other Ambulatory Visit: Payer: Self-pay | Admitting: Family Medicine

## 2012-08-20 DIAGNOSIS — Z7251 High risk heterosexual behavior: Secondary | ICD-10-CM

## 2012-08-21 ENCOUNTER — Other Ambulatory Visit: Payer: 59

## 2012-08-21 DIAGNOSIS — Z7251 High risk heterosexual behavior: Secondary | ICD-10-CM

## 2012-08-22 LAB — GC/CHLAMYDIA PROBE AMP, URINE: Chlamydia, Swab/Urine, PCR: NEGATIVE

## 2012-10-08 ENCOUNTER — Ambulatory Visit (INDEPENDENT_AMBULATORY_CARE_PROVIDER_SITE_OTHER): Payer: 59 | Admitting: Family Medicine

## 2012-10-08 ENCOUNTER — Encounter: Payer: Self-pay | Admitting: Family Medicine

## 2012-10-08 VITALS — BP 130/84 | HR 97 | Temp 98.3°F | Wt 222.8 lb

## 2012-10-08 DIAGNOSIS — R319 Hematuria, unspecified: Secondary | ICD-10-CM

## 2012-10-08 DIAGNOSIS — E162 Hypoglycemia, unspecified: Secondary | ICD-10-CM | POA: Insufficient documentation

## 2012-10-08 LAB — TSH: TSH: 1.27 u[IU]/mL (ref 0.35–5.50)

## 2012-10-08 LAB — CBC WITH DIFFERENTIAL/PLATELET
Basophils Absolute: 0.1 10*3/uL (ref 0.0–0.1)
Eosinophils Absolute: 0.4 10*3/uL (ref 0.0–0.7)
HCT: 40.2 % (ref 36.0–46.0)
Hemoglobin: 12.9 g/dL (ref 12.0–15.0)
Lymphs Abs: 2.8 10*3/uL (ref 0.7–4.0)
MCHC: 32 g/dL (ref 30.0–36.0)
MCV: 85.7 fl (ref 78.0–100.0)
Monocytes Absolute: 0.5 10*3/uL (ref 0.1–1.0)
Neutro Abs: 5.1 10*3/uL (ref 1.4–7.7)
Platelets: 360 10*3/uL (ref 150.0–400.0)
RDW: 13.6 % (ref 11.5–14.6)

## 2012-10-08 LAB — POCT URINALYSIS DIPSTICK
Glucose, UA: NEGATIVE
Ketones, UA: NEGATIVE
Spec Grav, UA: 1.015
Urobilinogen, UA: 0.2

## 2012-10-08 LAB — BASIC METABOLIC PANEL
BUN: 12 mg/dL (ref 6–23)
CO2: 26 mEq/L (ref 19–32)
Chloride: 102 mEq/L (ref 96–112)
GFR: 72.22 mL/min (ref >60.00)
Glucose, Bld: 110 mg/dL — ABNORMAL HIGH (ref 70–99)
Potassium: 4 mEq/L (ref 3.5–5.1)
Sodium: 135 mEq/L (ref 135–145)

## 2012-10-08 LAB — LIPID PANEL
Cholesterol: 185 mg/dL (ref 0–200)
HDL: 53.4 mg/dL (ref >39.00)
VLDL: 22.6 mg/dL (ref 0.0–40.0)

## 2012-10-08 LAB — HEPATIC FUNCTION PANEL
Albumin: 3.9 g/dL (ref 3.5–5.2)
Alkaline Phosphatase: 70 U/L (ref 39–117)
Total Protein: 7.6 g/dL (ref 6.0–8.3)

## 2012-10-08 LAB — MICROALBUMIN / CREATININE URINE RATIO: Microalb, Ur: 2.8 mg/dL — ABNORMAL HIGH (ref 0.0–1.9)

## 2012-10-08 LAB — HEMOGLOBIN A1C: Hgb A1c MFr Bld: 5.6 % (ref 4.6–6.5)

## 2012-10-08 MED ORDER — GLUCOSE BLOOD VI STRP: ORAL_STRIP | Status: DC

## 2012-10-08 MED ORDER — ONETOUCH DELICA LANCETS FINE MISC: 1.0000 "application " | Freq: Every day | Status: DC

## 2012-10-08 NOTE — Addendum Note (Signed)
Addended by: Silvio Pate D on: 10/08/2012 11:14 AM   Modules accepted: Orders

## 2012-10-08 NOTE — Progress Notes (Signed)
  Subjective:    Patient ID: [name] [name], female    DOB: [birth year], 41 y.o.   MRN: [id number]  HPI Pt here c/o shaking about 2 hours after eating---BS was 66.  Pt feels better with eating.  She does get a little light headed with episodes as well. No sob.    Review of Systems As above    Objective:   Physical Exam BP 130/84  Pulse 97  Temp 98.3 F (36.8 C) (Oral)  Wt 222 lb 12.8 oz (101.061 kg)  SpO2 97% General appearance: alert, cooperative, appears stated age and no distress Lungs: clear to auscultation bilaterally Heart:  S1S2  reg Extremities: extremities normal, atraumatic, no cyanosis or edema       Assessment & Plan:

## 2012-10-08 NOTE — Assessment & Plan Note (Signed)
See HO Check labs

## 2012-10-08 NOTE — Patient Instructions (Signed)

## 2012-10-08 NOTE — Addendum Note (Signed)
Addended by: Arnette Norris on: 10/08/2012 03:31 PM   Modules accepted: Orders

## 2012-10-10 LAB — URINE CULTURE: Colony Count: NO GROWTH

## 2012-10-29 ENCOUNTER — Telehealth: Payer: Self-pay | Admitting: Family Medicine

## 2012-10-29 MED ORDER — GLUCOSE BLOOD VI STRP: ORAL_STRIP | Status: DC

## 2012-10-29 MED ORDER — TRUERESULT BLOOD GLUCOSE W/DEVICE KIT: 1.0000 | PACK | Status: DC | PRN

## 2012-10-29 NOTE — Telephone Encounter (Signed)
Per fax received today from [facility]  *Patient given Verio Meter, Strips are $$, may we change to:  1-True result meter?  Also requesting RX for5 2-True test strips  3-Lancets  4-Alcohol pads

## 2012-10-29 NOTE — Telephone Encounter (Signed)
Ok to change

## 2012-10-29 NOTE — Telephone Encounter (Signed)
Please advise      KP 

## 2012-11-06 ENCOUNTER — Encounter: Payer: Self-pay | Admitting: Lab

## 2012-11-07 ENCOUNTER — Encounter: Payer: Self-pay | Admitting: Family Medicine

## 2012-11-07 ENCOUNTER — Ambulatory Visit (INDEPENDENT_AMBULATORY_CARE_PROVIDER_SITE_OTHER): Payer: 59 | Admitting: Family Medicine

## 2012-11-07 VITALS — BP 120/82 | HR 102 | Temp 98.2°F | Ht 67.0 in | Wt 222.8 lb

## 2012-11-07 DIAGNOSIS — F329 Major depressive disorder, single episode, unspecified: Secondary | ICD-10-CM

## 2012-11-07 DIAGNOSIS — I498 Other specified cardiac arrhythmias: Secondary | ICD-10-CM

## 2012-11-07 DIAGNOSIS — Z0000 Encounter for general adult medical examination without abnormal findings: Secondary | ICD-10-CM

## 2012-11-07 DIAGNOSIS — E039 Hypothyroidism, unspecified: Secondary | ICD-10-CM

## 2012-11-07 DIAGNOSIS — K219 Gastro-esophageal reflux disease without esophagitis: Secondary | ICD-10-CM

## 2012-11-07 DIAGNOSIS — I10 Essential (primary) hypertension: Secondary | ICD-10-CM

## 2012-11-07 DIAGNOSIS — R739 Hyperglycemia, unspecified: Secondary | ICD-10-CM

## 2012-11-07 DIAGNOSIS — R7309 Other abnormal glucose: Secondary | ICD-10-CM

## 2012-11-07 DIAGNOSIS — E785 Hyperlipidemia, unspecified: Secondary | ICD-10-CM

## 2012-11-07 DIAGNOSIS — R000 Tachycardia, unspecified: Secondary | ICD-10-CM

## 2012-11-07 DIAGNOSIS — Z23 Encounter for immunization: Secondary | ICD-10-CM

## 2012-11-07 DIAGNOSIS — F3289 Other specified depressive episodes: Secondary | ICD-10-CM

## 2012-11-07 LAB — LIPID PANEL
HDL: 54.1 mg/dL (ref >39.00)
LDL Cholesterol: 96 mg/dL (ref 0–99)
Total CHOL/HDL Ratio: 3
VLDL: 19.6 mg/dL (ref 0.0–40.0)

## 2012-11-07 LAB — CBC WITH DIFFERENTIAL/PLATELET
Basophils Absolute: 0 10*3/uL (ref 0.0–0.1)
Eosinophils Relative: 3.4 % (ref 0.0–5.0)
Lymphocytes Relative: 22.2 % (ref 12.0–46.0)
Monocytes Relative: 7 % (ref 3.0–12.0)
Neutrophils Relative %: 67.2 % (ref 43.0–77.0)
Platelets: 360 10*3/uL (ref 150.0–400.0)
RDW: 13.5 % (ref 11.5–14.6)
WBC: 9.8 10*3/uL (ref 4.5–10.5)

## 2012-11-07 LAB — BASIC METABOLIC PANEL
BUN: 14 mg/dL (ref 6–23)
Calcium: 9.3 mg/dL (ref 8.4–10.5)
GFR: 64.81 mL/min (ref >60.00)
Glucose, Bld: 95 mg/dL (ref 70–99)

## 2012-11-07 LAB — HEPATIC FUNCTION PANEL
Albumin: 4 g/dL (ref 3.5–5.2)
Alkaline Phosphatase: 66 U/L (ref 39–117)
Bilirubin, Direct: 0 mg/dL (ref 0.0–0.3)
Total Bilirubin: 0.4 mg/dL (ref 0.3–1.2)

## 2012-11-07 LAB — HEMOGLOBIN A1C: Hgb A1c MFr Bld: 5.8 % (ref 4.6–6.5)

## 2012-11-07 MED ORDER — METOPROLOL SUCCINATE ER 50 MG PO TB24: 50.0000 mg | ORAL_TABLET | Freq: Every day | ORAL | Status: DC | PRN

## 2012-11-07 MED ORDER — OMEPRAZOLE 20 MG PO CPDR: DELAYED_RELEASE_CAPSULE | ORAL | Status: DC

## 2012-11-07 NOTE — Assessment & Plan Note (Signed)
Stable con't meds 

## 2012-11-07 NOTE — Assessment & Plan Note (Signed)
Check labs 

## 2012-11-07 NOTE — Assessment & Plan Note (Signed)
Refill meds

## 2012-11-07 NOTE — Assessment & Plan Note (Signed)
Metoprolol prn 

## 2012-11-07 NOTE — Patient Instructions (Addendum)
Preventive Care for Adults, Female A healthy lifestyle and preventive care can promote health and wellness. Preventive health guidelines for women include the following key practices.  A routine yearly physical is a good way to check with your caregiver about your health and preventive screening. It is a chance to share any concerns and updates on your health, and to receive a thorough exam.  Visit your dentist for a routine exam and preventive care every 6 months. Brush your teeth twice a day and floss once a day. Good oral hygiene prevents tooth decay and gum disease.  The frequency of eye exams is based on your age, health, family medical history, use of contact lenses, and other factors. Follow your caregiver's recommendations for frequency of eye exams.  Eat a healthy diet. Foods like vegetables, fruits, whole grains, low-fat dairy products, and lean protein foods contain the nutrients you need without too many calories. Decrease your intake of foods high in solid fats, added sugars, and salt. Eat the right amount of calories for you.Get information about a proper diet from your caregiver, if necessary.  Regular physical exercise is one of the most important things you can do for your health. Most adults should get at least 150 minutes of moderate-intensity exercise (any activity that increases your heart rate and causes you to sweat) each week. In addition, most adults need muscle-strengthening exercises on 2 or more days a week.  Maintain a healthy weight. The body mass index (BMI) is a screening tool to identify possible weight problems. It provides an estimate of body fat based on height and weight. Your caregiver can help determine your BMI, and can help you achieve or maintain a healthy weight.For adults 20 years and older:  A BMI below 18.5 is considered underweight.  A BMI of 18.5 to 24.9 is normal.  A BMI of 25 to 29.9 is considered overweight.  A BMI of 30 and above is  considered obese.  Maintain normal blood lipids and cholesterol levels by exercising and minimizing your intake of saturated fat. Eat a balanced diet with plenty of fruit and vegetables. Blood tests for lipids and cholesterol should begin at age 20 and be repeated every 5 years. If your lipid or cholesterol levels are high, you are over 50, or you are at high risk for heart disease, you may need your cholesterol levels checked more frequently.Ongoing high lipid and cholesterol levels should be treated with medicines if diet and exercise are not effective.  If you smoke, find out from your caregiver how to quit. If you do not use tobacco, do not start.  If you are pregnant, do not drink alcohol. If you are breastfeeding, be very cautious about drinking alcohol. If you are not pregnant and choose to drink alcohol, do not exceed 1 drink per day. One drink is considered to be 12 ounces (355 mL) of beer, 5 ounces (148 mL) of wine, or 1.5 ounces (44 mL) of liquor.  Avoid use of street drugs. Do not share needles with anyone. Ask for help if you need support or instructions about stopping the use of drugs.  High blood pressure causes heart disease and increases the risk of stroke. Your blood pressure should be checked at least every 1 to 2 years. Ongoing high blood pressure should be treated with medicines if weight loss and exercise are not effective.  If you are 55 to 39 years old, ask your caregiver if you should take aspirin to prevent strokes.  Diabetes   screening involves taking a blood sample to check your fasting blood sugar level. This should be done once every 3 years, after age 45, if you are within normal weight and without risk factors for diabetes. Testing should be considered at a younger age or be carried out more frequently if you are overweight and have at least 1 risk factor for diabetes.  Breast cancer screening is essential preventive care for women. You should practice "breast  self-awareness." This means understanding the normal appearance and feel of your breasts and may include breast self-examination. Any changes detected, no matter how small, should be reported to a caregiver. Women in their 20s and 30s should have a clinical breast exam (CBE) by a caregiver as part of a regular health exam every 1 to 3 years. After age 40, women should have a CBE every year. Starting at age 40, women should consider having a mammography (breast X-ray test) every year. Women who have a family history of breast cancer should talk to their caregiver about genetic screening. Women at a high risk of breast cancer should talk to their caregivers about having magnetic resonance imaging (MRI) and a mammography every year.  The Pap test is a screening test for cervical cancer. A Pap test can show cell changes on the cervix that might become cervical cancer if left untreated. A Pap test is a procedure in which cells are obtained and examined from the lower end of the uterus (cervix).  Women should have a Pap test starting at age 21.  Between ages 21 and 29, Pap tests should be repeated every 2 years.  Beginning at age 30, you should have a Pap test every 3 years as long as the past 3 Pap tests have been normal.  Some women have medical problems that increase the chance of getting cervical cancer. Talk to your caregiver about these problems. It is especially important to talk to your caregiver if a new problem develops soon after your last Pap test. In these cases, your caregiver may recommend more frequent screening and Pap tests.  The above recommendations are the same for women who have or have not gotten the vaccine for human papillomavirus (HPV).  If you had a hysterectomy for a problem that was not cancer or a condition that could lead to cancer, then you no longer need Pap tests. Even if you no longer need a Pap test, a regular exam is a good idea to make sure no other problems are  starting.  If you are between ages 65 and 70, and you have had normal Pap tests going back 10 years, you no longer need Pap tests. Even if you no longer need a Pap test, a regular exam is a good idea to make sure no other problems are starting.  If you have had past treatment for cervical cancer or a condition that could lead to cancer, you need Pap tests and screening for cancer for at least 20 years after your treatment.  If Pap tests have been discontinued, risk factors (such as a new sexual partner) need to be reassessed to determine if screening should be resumed.  The HPV test is an additional test that may be used for cervical cancer screening. The HPV test looks for the virus that can cause the cell changes on the cervix. The cells collected during the Pap test can be tested for HPV. The HPV test could be used to screen women aged 30 years and older, and should   be used in women of any age who have unclear Pap test results. After the age of 30, women should have HPV testing at the same frequency as a Pap test.  Colorectal cancer can be detected and often prevented. Most routine colorectal cancer screening begins at the age of 50 and continues through age 75. However, your caregiver may recommend screening at an earlier age if you have risk factors for colon cancer. On a yearly basis, your caregiver may provide home test kits to check for hidden blood in the stool. Use of a small camera at the end of a tube, to directly examine the colon (sigmoidoscopy or colonoscopy), can detect the earliest forms of colorectal cancer. Talk to your caregiver about this at age 50, when routine screening begins. Direct examination of the colon should be repeated every 5 to 10 years through age 75, unless early forms of pre-cancerous polyps or small growths are found.  Hepatitis C blood testing is recommended for all people born from 1945 through 1965 and any individual with known risks for hepatitis C.  Practice  safe sex. Use condoms and avoid high-risk sexual practices to reduce the spread of sexually transmitted infections (STIs). STIs include gonorrhea, chlamydia, syphilis, trichomonas, herpes, HPV, and human immunodeficiency virus (HIV). Herpes, HIV, and HPV are viral illnesses that have no cure. They can result in disability, cancer, and death. Sexually active women aged 25 and younger should be checked for chlamydia. Older women with new or multiple partners should also be tested for chlamydia. Testing for other STIs is recommended if you are sexually active and at increased risk.  Osteoporosis is a disease in which the bones lose minerals and strength with aging. This can result in serious bone fractures. The risk of osteoporosis can be identified using a bone density scan. Women ages 65 and over and women at risk for fractures or osteoporosis should discuss screening with their caregivers. Ask your caregiver whether you should take a calcium supplement or vitamin D to reduce the rate of osteoporosis.  Menopause can be associated with physical symptoms and risks. Hormone replacement therapy is available to decrease symptoms and risks. You should talk to your caregiver about whether hormone replacement therapy is right for you.  Use sunscreen with sun protection factor (SPF) of 30 or more. Apply sunscreen liberally and repeatedly throughout the day. You should seek shade when your shadow is shorter than you. Protect yourself by wearing long sleeves, pants, a wide-brimmed hat, and sunglasses year round, whenever you are outdoors.  Once a month, do a whole body skin exam, using a mirror to look at the skin on your back. Notify your caregiver of new moles, moles that have irregular borders, moles that are larger than a pencil eraser, or moles that have changed in shape or color.  Stay current with required immunizations.  Influenza. You need a dose every fall (or winter). The composition of the flu vaccine  changes each year, so being vaccinated once is not enough.  Pneumococcal polysaccharide. You need 1 to 2 doses if you smoke cigarettes or if you have certain chronic medical conditions. You need 1 dose at age 65 (or older) if you have never been vaccinated.  Tetanus, diphtheria, pertussis (Tdap, Td). Get 1 dose of Tdap vaccine if you are younger than age 65, are over 65 and have contact with an infant, are a healthcare worker, are pregnant, or simply want to be protected from whooping cough. After that, you need a Td   booster dose every 10 years. Consult your caregiver if you have not had at least 3 tetanus and diphtheria-containing shots sometime in your life or have a deep or dirty wound.  HPV. You need this vaccine if you are a woman age 26 or younger. The vaccine is given in 3 doses over 6 months.  Measles, mumps, rubella (MMR). You need at least 1 dose of MMR if you were born in 1957 or later. You may also need a second dose.  Meningococcal. If you are age 19 to 21 and a first-year college student living in a residence hall, or have one of several medical conditions, you need to get vaccinated against meningococcal disease. You may also need additional booster doses.  Zoster (shingles). If you are age 60 or older, you should get this vaccine.  Varicella (chickenpox). If you have never had chickenpox or you were vaccinated but received only 1 dose, talk to your caregiver to find out if you need this vaccine.  Hepatitis A. You need this vaccine if you have a specific risk factor for hepatitis A virus infection or you simply wish to be protected from this disease. The vaccine is usually given as 2 doses, 6 to 18 months apart.  Hepatitis B. You need this vaccine if you have a specific risk factor for hepatitis B virus infection or you simply wish to be protected from this disease. The vaccine is given in 3 doses, usually over 6 months. Preventive Services / Frequency Ages 19 to 39  Blood  pressure check.** / Every 1 to 2 years.  Lipid and cholesterol check.** / Every 5 years beginning at age 20.  Clinical breast exam.** / Every 3 years for women in their 20s and 30s.  Pap test.** / Every 2 years from ages 21 through 29. Every 3 years starting at age 30 through age 65 or 70 with a history of 3 consecutive normal Pap tests.  HPV screening.** / Every 3 years from ages 30 through ages 65 to 70 with a history of 3 consecutive normal Pap tests.  Hepatitis C blood test.** / For any individual with known risks for hepatitis C.  Skin self-exam. / Monthly.  Influenza immunization.** / Every year.  Pneumococcal polysaccharide immunization.** / 1 to 2 doses if you smoke cigarettes or if you have certain chronic medical conditions.  Tetanus, diphtheria, pertussis (Tdap, Td) immunization. / A one-time dose of Tdap vaccine. After that, you need a Td booster dose every 10 years.  HPV immunization. / 3 doses over 6 months, if you are 26 and younger.  Measles, mumps, rubella (MMR) immunization. / You need at least 1 dose of MMR if you were born in 1957 or later. You may also need a second dose.  Meningococcal immunization. / 1 dose if you are age 19 to 21 and a first-year college student living in a residence hall, or have one of several medical conditions, you need to get vaccinated against meningococcal disease. You may also need additional booster doses.  Varicella immunization.** / Consult your caregiver.  Hepatitis A immunization.** / Consult your caregiver. 2 doses, 6 to 18 months apart.  Hepatitis B immunization.** / Consult your caregiver. 3 doses usually over 6 months. Ages 40 to 64  Blood pressure check.** / Every 1 to 2 years.  Lipid and cholesterol check.** / Every 5 years beginning at age 20.  Clinical breast exam.** / Every year after age 40.  Mammogram.** / Every year beginning at age 40   and continuing for as long as you are in good health. Consult with your  caregiver.  Pap test.** / Every 3 years starting at age 30 through age 65 or 70 with a history of 3 consecutive normal Pap tests.  HPV screening.** / Every 3 years from ages 30 through ages 65 to 70 with a history of 3 consecutive normal Pap tests.  Fecal occult blood test (FOBT) of stool. / Every year beginning at age 50 and continuing until age 75. You may not need to do this test if you get a colonoscopy every 10 years.  Flexible sigmoidoscopy or colonoscopy.** / Every 5 years for a flexible sigmoidoscopy or every 10 years for a colonoscopy beginning at age 50 and continuing until age 75.  Hepatitis C blood test.** / For all people born from 1945 through 1965 and any individual with known risks for hepatitis C.  Skin self-exam. / Monthly.  Influenza immunization.** / Every year.  Pneumococcal polysaccharide immunization.** / 1 to 2 doses if you smoke cigarettes or if you have certain chronic medical conditions.  Tetanus, diphtheria, pertussis (Tdap, Td) immunization.** / A one-time dose of Tdap vaccine. After that, you need a Td booster dose every 10 years.  Measles, mumps, rubella (MMR) immunization. / You need at least 1 dose of MMR if you were born in 1957 or later. You may also need a second dose.  Varicella immunization.** / Consult your caregiver.  Meningococcal immunization.** / Consult your caregiver.  Hepatitis A immunization.** / Consult your caregiver. 2 doses, 6 to 18 months apart.  Hepatitis B immunization.** / Consult your caregiver. 3 doses, usually over 6 months. Ages 65 and over  Blood pressure check.** / Every 1 to 2 years.  Lipid and cholesterol check.** / Every 5 years beginning at age 20.  Clinical breast exam.** / Every year after age 40.  Mammogram.** / Every year beginning at age 40 and continuing for as long as you are in good health. Consult with your caregiver.  Pap test.** / Every 3 years starting at age 30 through age 65 or 70 with a 3  consecutive normal Pap tests. Testing can be stopped between 65 and 70 with 3 consecutive normal Pap tests and no abnormal Pap or HPV tests in the past 10 years.  HPV screening.** / Every 3 years from ages 30 through ages 65 or 70 with a history of 3 consecutive normal Pap tests. Testing can be stopped between 65 and 70 with 3 consecutive normal Pap tests and no abnormal Pap or HPV tests in the past 10 years.  Fecal occult blood test (FOBT) of stool. / Every year beginning at age 50 and continuing until age 75. You may not need to do this test if you get a colonoscopy every 10 years.  Flexible sigmoidoscopy or colonoscopy.** / Every 5 years for a flexible sigmoidoscopy or every 10 years for a colonoscopy beginning at age 50 and continuing until age 75.  Hepatitis C blood test.** / For all people born from 1945 through 1965 and any individual with known risks for hepatitis C.  Osteoporosis screening.** / A one-time screening for women ages 65 and over and women at risk for fractures or osteoporosis.  Skin self-exam. / Monthly.  Influenza immunization.** / Every year.  Pneumococcal polysaccharide immunization.** / 1 dose at age 65 (or older) if you have never been vaccinated.  Tetanus, diphtheria, pertussis (Tdap, Td) immunization. / A one-time dose of Tdap vaccine if you are over   65 and have contact with an infant, are a healthcare worker, or simply want to be protected from whooping cough. After that, you need a Td booster dose every 10 years.  Varicella immunization.** / Consult your caregiver.  Meningococcal immunization.** / Consult your caregiver.  Hepatitis A immunization.** / Consult your caregiver. 2 doses, 6 to 18 months apart.  Hepatitis B immunization.** / Check with your caregiver. 3 doses, usually over 6 months. ** Family history and personal history of risk and conditions may change your caregiver's recommendations. Document Released: 01/02/2002 Document Revised: 01/29/2012  Document Reviewed: 04/03/2011 [employer] Patient Information 2013 [employer].  

## 2012-11-07 NOTE — Assessment & Plan Note (Signed)
Per psych 

## 2012-11-07 NOTE — Progress Notes (Signed)
Subjective:     [name] [name] is a 41 y.o. female and is here for a comprehensive physical exam. The patient reports no problems.  History   Social History  . Marital Status: Legally Separated    Spouse Name: N/A    Number of Children: N/A  . Years of Education: N/A   Occupational History  . Not on file.   Social History Main Topics  . Smoking status: Former Smoker -- 0.2 packs/day for 15 years    Types: Cigarettes    Quit date: 09/26/2010  . Smokeless tobacco: Never Used  . Alcohol Use: No  . Drug Use: No  . Sexually Active: Not Currently -- Female partner(s)    Birth Control/ Protection: None   Other Topics Concern  . Not on file   Social History Narrative  . No narrative on file   Health Maintenance  Topic Date Due  . Influenza Vaccine  07/21/2013  . Pap Smear  09/24/2014  . Tetanus/tdap  11/07/2022    The following portions of the patient's history were reviewed and updated as appropriate:  She  has a past medical history of GERD (gastroesophageal reflux disease); Depression; Hyperlipidemia; PCOS (polycystic ovarian syndrome); Migraines; SVD (spontaneous vaginal delivery); Tachycardia; Anxiety; and Osteoarthritis. She  does not have any pertinent problems on file. She  has past surgical history that includes Ovarian cyst removal; laparoscopy; Breast surgery; Wisdom tooth extraction; Colonoscopy; and laparoscopy (07/30/2012). Her family history includes Asthma in her paternal grandmother; Coronary artery disease in an unspecified family member; Diabetes in her father and mother; Emphysema in an unspecified family member; Hypertension in her mother; Kidney disease in her paternal grandmother; and Tongue cancer in an unspecified family member. She  reports that she quit smoking about 2 years ago. Her smoking use included Cigarettes. She has a 3.75 pack-year smoking history. She has never used smokeless tobacco. She reports that she does not drink alcohol or use illicit  drugs. She has a current medication list which includes the following prescription(s): alprazolam, aripiprazole, trueresult blood glucose, bupropion, glucose blood, glucose blood, linaclotide, metoprolol succinate, omeprazole, onetouch delica lancets fine, and venlafaxine xr. Current Outpatient Prescriptions on File Prior to Visit  Medication Sig Dispense Refill  . ALPRAZolam (XANAX) 1 MG tablet Take 1 tablet (1 mg total) by mouth 2 (two) times daily.  30 tablet    . ARIPiprazole (ABILIFY) 5 MG tablet Take 5 mg by mouth daily.        . Blood Glucose Monitoring Suppl (TRUERESULT BLOOD GLUCOSE) W/DEVICE KIT 1 kit by Other route as needed.  1 each  0  . buPROPion (WELLBUTRIN XL) 150 MG 24 hr tablet Take 2 tablets each morning and then1 tablet at 2 pm  90 tablet  5  . glucose blood test strip Check blood sugar once daily  100 each  12  . glucose blood test strip Check blood sugar daily  100 each  12  . Linaclotide (LINZESS) 290 MCG CAPS Take 1 capsule by mouth daily. Take 1 by mouth 30 mins prior to meal      . ONETOUCH DELICA LANCETS FINE MISC 1 application by Does not apply route daily. Check blood sugar once a day  100 each  12  . venlafaxine XR (EFFEXOR-XR) 75 MG 24 hr capsule Take 150 mg by mouth 2 (two) times daily.       She is allergic to aspirin; contrast media; and ibuprofen..  Review of Systems Review of Systems  Constitutional:  Negative for activity change, appetite change and fatigue.  HENT: Negative for hearing loss, congestion, tinnitus and ear discharge.  dentist q61m Eyes: Negative for visual disturbance (see optho q1y -- vision corrected to 20/20 with glasses).  Respiratory: Negative for cough, chest tightness and shortness of breath.   Cardiovascular: Negative for chest pain, palpitations and leg swelling.  Gastrointestinal: Negative for abdominal pain, diarrhea, constipation and abdominal distention.  Genitourinary: Negative for urgency, frequency, decreased urine volume and  difficulty urinating.  Musculoskeletal: Negative for back pain, arthralgias and gait problem.  Skin: Negative for color change, pallor and rash.  Neurological: Negative for dizziness, light-headedness, numbness and headaches.  Hematological: Negative for adenopathy. Does not bruise/bleed easily.  Psychiatric/Behavioral: Negative for suicidal ideas, confusion, sleep disturbance, self-injury, dysphoric mood, decreased concentration and agitation.       Objective:    BP 120/82  Pulse 102  Temp 98.2 F (36.8 C) (Oral)  Ht 5\' 7"  (1.702 m)  Wt 222 lb 12.8 oz (101.061 kg)  BMI 34.90 kg/m2  SpO2 98% General appearance: alert, cooperative, appears stated age and no distress Head: Normocephalic, without obvious abnormality, atraumatic Eyes: conjunctivae/corneas clear. PERRL, EOM's intact. Fundi benign. Ears: normal TM's and external ear canals both ears Nose: Nares normal. Septum midline. Mucosa normal. No drainage or sinus tenderness. Throat: lips, mucosa, and tongue normal; teeth and gums normal Neck: no adenopathy, no carotid bruit, no JVD, supple, symmetrical, trachea midline and thyroid not enlarged, symmetric, no tenderness/mass/nodules Back: symmetric, no curvature. ROM normal. No CVA tenderness. Lungs: clear to auscultation bilaterally Breasts: gyn Heart: regular rate and rhythm, S1, S2 normal, no murmur, click, rub or gallop Abdomen: soft, non-tender; bowel sounds normal; no masses,  no organomegaly Pelvic: deferred--gyn Extremities: extremities normal, atraumatic, no cyanosis or edema Pulses: 2+ and symmetric Skin: Skin color, texture, turgor normal. No rashes or lesions Lymph nodes: Cervical, supraclavicular, and axillary nodes normal. Neurologic: Alert and oriented X 3, normal strength and tone. Normal symmetric reflexes. Normal coordination and gait psych-- no depression    Assessment:    Healthy female exam.      Plan:  ghm utd Check labs   See After Visit Summary  for Counseling Recommendations

## 2012-11-07 NOTE — Assessment & Plan Note (Signed)
con't meds  Check labs 

## 2012-11-11 LAB — MICROALBUMIN / CREATININE URINE RATIO: Microalb Creat Ratio: 1.7 mg/g (ref 0.0–30.0)

## 2012-11-11 LAB — POCT URINALYSIS DIPSTICK
Ketones, UA: NEGATIVE
Leukocytes, UA: NEGATIVE
Protein, UA: NEGATIVE
Urobilinogen, UA: 0.2
pH, UA: 5

## 2012-11-11 NOTE — Addendum Note (Signed)
Addended by: Silvio Pate D on: 11/11/2012 12:47 PM   Modules accepted: Orders

## 2012-11-11 NOTE — Addendum Note (Signed)
Addended by: Silvio Pate D on: 11/11/2012 12:15 PM   Modules accepted: Orders

## 2012-11-12 MED ORDER — LISINOPRIL 5 MG PO TABS: 5.0000 mg | ORAL_TABLET | Freq: Every day | ORAL | Status: DC

## 2012-11-12 NOTE — Addendum Note (Signed)
Addended by: Edwena Felty T on: 11/12/2012 09:23 AM   Modules accepted: Orders

## 2012-11-19 ENCOUNTER — Telehealth: Payer: Self-pay

## 2012-11-19 NOTE — Telephone Encounter (Signed)
Patient has been made aware.      KP 

## 2012-11-19 NOTE — Telephone Encounter (Signed)
Yes --both together----5 mg should not affect bp too much

## 2012-11-19 NOTE — Telephone Encounter (Signed)
Notes Recorded by Lelon Perla, DO on 11/11/2012 at 4:55 PM [employer] micro[employer]in---- Start lisinopril 5 mg #30 1 po qd , 2 refills Recheck 3 months        Spoke with patient and she is currently on Metoprolol and wanted to know did you want her to take both meds. Please advise     KP

## 2012-12-25 ENCOUNTER — Encounter: Payer: Self-pay | Admitting: Family Medicine

## 2012-12-25 ENCOUNTER — Ambulatory Visit (INDEPENDENT_AMBULATORY_CARE_PROVIDER_SITE_OTHER): Payer: 59 | Admitting: Family Medicine

## 2012-12-25 VITALS — BP 122/76 | HR 61 | Temp 98.6°F | Wt 225.8 lb

## 2012-12-25 DIAGNOSIS — R059 Cough, unspecified: Secondary | ICD-10-CM

## 2012-12-25 DIAGNOSIS — R05 Cough: Secondary | ICD-10-CM

## 2012-12-25 DIAGNOSIS — E1121 Type 2 diabetes mellitus with diabetic nephropathy: Secondary | ICD-10-CM

## 2012-12-25 MED ORDER — LOSARTAN POTASSIUM 25 MG PO TABS: ORAL_TABLET | ORAL | Status: DC

## 2012-12-25 NOTE — Progress Notes (Signed)
  Subjective:     [name] [name] is a 42 y.o. female here for evaluation of a cough. Onset of symptoms was 1 month ago. Symptoms have been unchanged since that time. The cough is dry and is aggravated by nothing. Associated symptoms include: none. Patient does not have a history of asthma. Patient does not have a history of environmental allergens. Patient has not traveled recently. Patient does have a history of smoking. Patient has not had a previous chest x-ray. Patient has not had a PPD done.  The following portions of the patient's history were reviewed and updated as appropriate: allergies, current medications, past family history, past medical history, past social history, past surgical history and problem list.  Review of Systems Pertinent items are noted in HPI.    Objective:    Oxygen saturation 96% on room air BP 122/76  Pulse 61  Temp 98.6 F (37 C) (Oral)  Wt 225 lb 12.8 oz (102.422 kg)  SpO2 96% General appearance: alert, cooperative, appears stated age and no distress Lungs: clear to auscultation bilaterally Heart: S1, S2 normal    Assessment:    cough--probably secondary to ACEI    Plan:    Call if shortness of breath worsens, blood in sputum, change in character of cough, development of fever or chills, inability to maintain nutrition and hydration. Avoid exposure to tobacco smoke and fumes. stop lisinopril and start losartan 25 mg daily

## 2013-01-06 ENCOUNTER — Ambulatory Visit (INDEPENDENT_AMBULATORY_CARE_PROVIDER_SITE_OTHER): Payer: 59 | Admitting: Family Medicine

## 2013-01-06 ENCOUNTER — Encounter: Payer: Self-pay | Admitting: Family Medicine

## 2013-01-06 ENCOUNTER — Ambulatory Visit (HOSPITAL_BASED_OUTPATIENT_CLINIC_OR_DEPARTMENT_OTHER)
Admission: RE | Admit: 2013-01-06 | Discharge: 2013-01-06 | Disposition: A | Discharge status: Home / Self Care | Payer: 59 | Source: Ambulatory Visit | Attending: Family Medicine | Admitting: Family Medicine

## 2013-01-06 VITALS — BP 124/72 | HR 82 | Temp 97.1°F | Wt 225.0 lb

## 2013-01-06 DIAGNOSIS — M25559 Pain in unspecified hip: Secondary | ICD-10-CM | POA: Insufficient documentation

## 2013-01-06 DIAGNOSIS — M545 Low back pain, unspecified: Secondary | ICD-10-CM

## 2013-01-06 MED ORDER — CYCLOBENZAPRINE HCL 10 MG PO TABS: 10.0000 mg | ORAL_TABLET | Freq: Three times a day (TID) | ORAL | Status: DC | PRN

## 2013-01-06 MED ORDER — HYDROCODONE-ACETAMINOPHEN 5-325 MG PO TABS: 1.0000 | ORAL_TABLET | Freq: Three times a day (TID) | ORAL | Status: DC | PRN

## 2013-01-06 NOTE — Patient Instructions (Signed)
Back Pain, Adult Low back pain is very common. About 1 in 5 people have back pain.The cause of low back pain is rarely dangerous. The pain often gets better over time.About half of people with a sudden onset of back pain feel better in just 2 weeks. About 8 in 10 people feel better by 6 weeks.  CAUSES Some common causes of back pain include:  Strain of the muscles or ligaments supporting the spine.  Wear and tear (degeneration) of the spinal discs.  Arthritis.  Direct injury to the back. DIAGNOSIS Most of the time, the direct cause of low back pain is not known.However, back pain can be treated effectively even when the exact cause of the pain is unknown.Answering your caregiver's questions about your overall health and symptoms is one of the most accurate ways to make sure the cause of your pain is not dangerous. If your caregiver needs more information, he or she may order lab work or imaging tests (X-rays or MRIs).However, even if imaging tests show changes in your back, this usually does not require surgery. HOME CARE INSTRUCTIONS For many people, back pain returns.Since low back pain is rarely dangerous, it is often a condition that people can learn to manageon their own.   Remain active. It is stressful on the back to sit or stand in one place. Do not sit, drive, or stand in one place for more than 30 minutes at a time. Take short walks on level surfaces as soon as pain allows.Try to increase the length of time you walk each day.  Do not stay in bed.Resting more than 1 or 2 days can delay your recovery.  Do not avoid exercise or work.Your body is made to move.It is not dangerous to be active, even though your back may hurt.Your back will likely heal faster if you return to being active before your pain is gone.  Pay attention to your body when you bend and lift. Many people have less discomfortwhen lifting if they bend their knees, keep the load close to their bodies,and  avoid twisting. Often, the most comfortable positions are those that put less stress on your recovering back.  Find a comfortable position to sleep. Use a firm mattress and lie on your side with your knees slightly bent. If you lie on your back, put a pillow under your knees.  Only take over-the-counter or prescription medicines as directed by your caregiver. Over-the-counter medicines to reduce pain and inflammation are often the most helpful.Your caregiver may prescribe muscle relaxant drugs.These medicines help dull your pain so you can more quickly return to your normal activities and healthy exercise.  Put ice on the injured area.  Put ice in a plastic bag.  Place a towel between your skin and the bag.  Leave the ice on for 15 to 20 minutes, 3 to 4 times a day for the first 2 to 3 days. After that, ice and heat may be alternated to reduce pain and spasms.  Ask your caregiver about trying back exercises and gentle massage. This may be of some benefit.  Avoid feeling anxious or stressed.Stress increases muscle tension and can worsen back pain.It is important to recognize when you are anxious or stressed and learn ways to manage it.Exercise is a great option. SEEK MEDICAL CARE IF:  You have pain that is not relieved with rest or medicine.  You have pain that does not improve in 1 week.  You have new symptoms.  You are generally   not feeling well. SEEK IMMEDIATE MEDICAL CARE IF:   You have pain that radiates from your back into your legs.  You develop new bowel or bladder control problems.  You have unusual weakness or numbness in your arms or legs.  You develop nausea or vomiting.  You develop abdominal pain.  You feel faint. Document Released: 11/06/2005 Document Revised: 05/07/2012 Document Reviewed: 03/27/2011 Exit[employer] Patient Information 2013 [employer].  

## 2013-01-06 NOTE — Progress Notes (Signed)
  Subjective:    [name] [name] is a 41 y.o. female who presents for evaluation of low back pain. The patient has had no prior back problems. Symptoms have been present for 5 days and are gradually worsening.  Onset was related to / precipitated by a fall. The pain is located in the left lumbar area and radiates to the left thigh. The pain is described as sharp and throbbing and occurs all day. She rates her pain as moderate. Symptoms are exacerbated by flexion, sitting, standing and twisting. Symptoms are improved by rest. She has also tried acetaminophen which provided no symptom relief. She has no other symptoms associated with the back pain. The patient has no "red flag" history indicative of complicated back pain.  The following portions of the patient's history were reviewed and updated as appropriate: allergies, current medications, past family history, past medical history, past social history, past surgical history and problem list.  Review of Systems Pertinent items are noted in HPI.    Objective:   FROM low ext- pain with SLR but able to do it. DTR = and b/l    Assessment:    Nonspecific acute low back pain    Plan:    Educational material distributed. Regular aerobic and trunk strengthening exercises discussed. Short (2-4 day) period of relative rest recommended until acute symptoms improve. Ice to affected area as needed for local pain relief. Heat to affected area as needed for local pain relief. Muscle relaxants per medication orders.

## 2013-01-09 ENCOUNTER — Encounter: Payer: Self-pay | Admitting: Lab

## 2013-01-10 ENCOUNTER — Other Ambulatory Visit: Payer: 59

## 2013-01-13 ENCOUNTER — Encounter: Payer: Self-pay | Admitting: Lab

## 2013-01-14 ENCOUNTER — Other Ambulatory Visit: Payer: 59

## 2013-01-15 ENCOUNTER — Other Ambulatory Visit: Payer: 59

## 2013-01-16 ENCOUNTER — Other Ambulatory Visit (INDEPENDENT_AMBULATORY_CARE_PROVIDER_SITE_OTHER): Payer: 59

## 2013-01-16 DIAGNOSIS — R7989 Other specified abnormal findings of blood chemistry: Secondary | ICD-10-CM

## 2013-01-16 LAB — HEPATIC FUNCTION PANEL: Total Bilirubin: 0.6 mg/dL (ref 0.3–1.2)

## 2013-01-16 LAB — HEMOGLOBIN A1C: Hgb A1c MFr Bld: 5.7 % (ref 4.6–6.5)

## 2013-01-16 LAB — LIPID PANEL
HDL: 50.2 mg/dL (ref >39.00)
Total CHOL/HDL Ratio: 3
VLDL: 14 mg/dL (ref 0.0–40.0)

## 2013-01-16 LAB — BASIC METABOLIC PANEL
Calcium: 9.1 mg/dL (ref 8.4–10.5)
Creatinine, Ser: 1.1 mg/dL (ref 0.4–1.2)

## 2013-01-20 ENCOUNTER — Encounter: Payer: Self-pay | Admitting: Family Medicine

## 2013-01-20 ENCOUNTER — Ambulatory Visit (INDEPENDENT_AMBULATORY_CARE_PROVIDER_SITE_OTHER): Payer: 59 | Admitting: Family Medicine

## 2013-01-20 VITALS — BP 93/66 | HR 95 | Temp 100.6°F | Wt 221.6 lb

## 2013-01-20 DIAGNOSIS — N390 Urinary tract infection, site not specified: Secondary | ICD-10-CM

## 2013-01-20 DIAGNOSIS — R1013 Epigastric pain: Secondary | ICD-10-CM

## 2013-01-20 DIAGNOSIS — R110 Nausea: Secondary | ICD-10-CM

## 2013-01-20 DIAGNOSIS — R112 Nausea with vomiting, unspecified: Secondary | ICD-10-CM

## 2013-01-20 DIAGNOSIS — R509 Fever, unspecified: Secondary | ICD-10-CM

## 2013-01-20 LAB — CBC WITH DIFFERENTIAL/PLATELET
Basophils Absolute: 0 10*3/uL (ref 0.0–0.1)
HCT: 41.5 % (ref 36.0–46.0)
Hemoglobin: 14 g/dL (ref 12.0–15.0)
Lymphocytes Relative: 8 % — ABNORMAL LOW (ref 12–46)
Monocytes Absolute: 0.9 10*3/uL (ref 0.1–1.0)
Monocytes Relative: 7 % (ref 3–12)
Neutro Abs: 10.4 10*3/uL — ABNORMAL HIGH (ref 1.7–7.7)
WBC: 12.6 10*3/uL — ABNORMAL HIGH (ref 4.0–10.5)

## 2013-01-20 LAB — POCT INFLUENZA A/B: Influenza A, POC: NEGATIVE

## 2013-01-20 LAB — HEPATIC FUNCTION PANEL
ALT: 18 U/L (ref 0–35)
Bilirubin, Direct: 0.1 mg/dL (ref 0.0–0.3)
Indirect Bilirubin: 0.5 mg/dL (ref 0.0–0.9)
Total Bilirubin: 0.6 mg/dL (ref 0.3–1.2)

## 2013-01-20 LAB — BASIC METABOLIC PANEL
Calcium: 9.3 mg/dL (ref 8.4–10.5)
Sodium: 139 mEq/L (ref 135–145)

## 2013-01-20 LAB — POCT URINALYSIS DIPSTICK
Glucose, UA: NEGATIVE
Spec Grav, UA: 1.015
Urobilinogen, UA: 0.2

## 2013-01-20 MED ORDER — GI COCKTAIL ~~LOC~~
30.0000 mL | Freq: Once | ORAL | Status: AC
  Administered 2013-01-20: 30 mL via ORAL

## 2013-01-20 MED ORDER — PROMETHAZINE HCL 25 MG RE SUPP: 25.0000 mg | Freq: Four times a day (QID) | RECTAL | Status: DC | PRN

## 2013-01-20 MED ORDER — PROMETHAZINE HCL 50 MG/ML IJ SOLN
50.0000 mg | Freq: Once | INTRAMUSCULAR | Status: AC
  Administered 2013-01-20: 50 mg via INTRAMUSCULAR

## 2013-01-20 NOTE — Addendum Note (Signed)
Addended by: Silvio Pate D on: 01/20/2013 02:21 PM   Modules accepted: Orders

## 2013-01-20 NOTE — Addendum Note (Signed)
Addended by: Silvio Pate D on: 01/20/2013 02:41 PM   Modules accepted: Orders

## 2013-01-20 NOTE — Progress Notes (Signed)
  Subjective:     [name] [name] is a 42 y.o. female who presents for evaluation of fever. She has had the fever for 1 day. Symptoms have been gradually worsening. Symptoms are described as fevers up to 102 degrees, and are worse all day. Associated symptoms are abdominal pain, body aches, chills, nausea, poor appetite and vomiting. Patient denies diarrhea, headache, otitis symptoms, URI symptoms and urinary tract symptoms.  She has tried to alleviate the symptoms with acetaminophen with no relief because she can not hold anything down. The patient has no known comorbidities (structural heart/valvular disease, prosthetic joints, immunocompromised state, recent dental work, known abscesses).  The following portions of the patient's history were reviewed and updated as appropriate: allergies, current medications, past family history, past medical history, past social history, past surgical history and problem list.  Review of Systems Pertinent items are noted in HPI.   Objective:    BP 93/66  Pulse 95  Temp(Src) 100.6 F (38.1 C) (Tympanic)  Wt 221 lb 9.6 oz (100.517 kg)  BMI 34.7 kg/m2  SpO2 97%  LMP 12/21/2012 General appearance: alert, cooperative, appears stated age and no distress Ears: normal TM's and external ear canals both ears Nose: Nares normal. Septum midline. Mucosa normal. No drainage or sinus tenderness. Throat: lips, mucosa, and tongue normal; teeth and gums normal Neck: no adenopathy, supple, symmetrical, trachea midline and thyroid not enlarged, symmetric, no tenderness/mass/nodules Lungs: clear to auscultation bilaterally Abdomen: abnormal findings:  moderate tenderness in the epigastrium   Assessment:    Fever is likely secondary to gastroenteritis. --  Plan:    Supportive care with appropriate antipyretics and fluids. Obtain labs per orders. Tour manager. f/u prn  Phenergan supp Check labs

## 2013-01-20 NOTE — Patient Instructions (Signed)
Viral Gastroenteritis Viral gastroenteritis is also known as stomach flu. This condition affects the stomach and intestinal tract. It can cause sudden diarrhea and vomiting. The illness typically lasts 3 to 8 days. Most people develop an immune response that eventually gets rid of the virus. While this natural response develops, the virus can make you quite ill. CAUSES  Many different viruses can cause gastroenteritis, such as rotavirus or noroviruses. You can catch one of these viruses by consuming contaminated food or water. You may also catch a virus by sharing utensils or other personal items with an infected person or by touching a contaminated surface. SYMPTOMS  The most common symptoms are diarrhea and vomiting. These problems can cause a severe loss of body fluids (dehydration) and a body salt (electrolyte) imbalance. Other symptoms may include:  Fever.  Headache.  Fatigue.  Abdominal pain. DIAGNOSIS  Your caregiver can usually diagnose viral gastroenteritis based on your symptoms and a physical exam. A stool sample may also be taken to test for the presence of viruses or other infections. TREATMENT  This illness typically goes away on its own. Treatments are aimed at rehydration. The most serious cases of viral gastroenteritis involve vomiting so severely that you are not able to keep fluids down. In these cases, fluids must be given through an intravenous line (IV). HOME CARE INSTRUCTIONS   Drink enough fluids to keep your urine clear or pale yellow. Drink small amounts of fluids frequently and increase the amounts as tolerated.  Ask your caregiver for specific rehydration instructions.  Avoid:  Foods high in sugar.  Alcohol.  Carbonated drinks.  Tobacco.  Juice.  Caffeine drinks.  Extremely hot or cold fluids.  Fatty, greasy foods.  Too much intake of anything at one time.  Dairy products until 24 to 48 hours after diarrhea stops.  You may consume probiotics.  Probiotics are active cultures of beneficial bacteria. They may lessen the amount and number of diarrheal stools in adults. Probiotics can be found in yogurt with active cultures and in supplements.  Wash your hands well to avoid spreading the virus.  Only take over-the-counter or prescription medicines for pain, discomfort, or fever as directed by your caregiver. Do not give aspirin to children. Antidiarrheal medicines are not recommended.  Ask your caregiver if you should continue to take your regular prescribed and over-the-counter medicines.  Keep all follow-up appointments as directed by your caregiver. SEEK IMMEDIATE MEDICAL CARE IF:   You are unable to keep fluids down.  You do not urinate at least once every 6 to 8 hours.  You develop shortness of breath.  You notice blood in your stool or vomit. This may look like coffee grounds.  You have abdominal pain that increases or is concentrated in one small area (localized).  You have persistent vomiting or diarrhea.  You have a fever.  The patient is a child younger than 3 months, and he or she has a fever.  The patient is a child older than 3 months, and he or she has a fever and persistent symptoms.  The patient is a child older than 3 months, and he or she has a fever and symptoms suddenly get worse.  The patient is a baby, and he or she has no tears when crying. MAKE SURE YOU:   Understand these instructions.  Will watch your condition.  Will get help right away if you are not doing well or get worse. Document Released: 11/06/2005 Document Revised: 01/29/2012 Document Reviewed: 08/23/2011   [employer] Patient Information 2013 [employer].  

## 2013-01-22 ENCOUNTER — Other Ambulatory Visit: Payer: Self-pay

## 2013-01-22 LAB — URINE CULTURE: Colony Count: >=100000

## 2013-01-22 MED ORDER — CIPROFLOXACIN HCL 500 MG PO TABS: 500.0000 mg | ORAL_TABLET | Freq: Two times a day (BID) | ORAL | Status: DC

## 2013-01-22 NOTE — Addendum Note (Signed)
Addended by: Silvio Pate D on: 01/22/2013 04:55 PM   Modules accepted: Orders

## 2013-01-24 LAB — URINE CULTURE: Colony Count: >=100000

## 2013-02-14 ENCOUNTER — Encounter (INDEPENDENT_AMBULATORY_CARE_PROVIDER_SITE_OTHER): Payer: Self-pay | Admitting: Surgery

## 2013-02-17 ENCOUNTER — Encounter (INDEPENDENT_AMBULATORY_CARE_PROVIDER_SITE_OTHER): Payer: Self-pay | Admitting: Surgery

## 2013-02-17 ENCOUNTER — Ambulatory Visit (INDEPENDENT_AMBULATORY_CARE_PROVIDER_SITE_OTHER): Payer: 59 | Admitting: Surgery

## 2013-02-17 VITALS — BP 140/88 | HR 76 | Temp 97.2°F | Resp 16 | Ht 67.0 in | Wt 218.0 lb

## 2013-02-17 DIAGNOSIS — K811 Chronic cholecystitis: Secondary | ICD-10-CM

## 2013-02-17 NOTE — Progress Notes (Signed)
Patient ID: [name] [name], female   DOB: 27-Jun-1971, 42 y.o.   MRN: [id number]  Chief Complaint  Patient presents with  . New Evaluation    eval of low GB ejection fraction    HPI [name] [name] is a 42 y.o. female.   HPI This is a pleasant female referred by Dr. Arty Baumgartner.  She has been having right-sided abdominal pain her increase in the back with nausea for several years. She has had an extensive workup including laparoscopy which has been unremarkable. It is difficult to relate as to what she is also now hurts every single day. This is starting to limit her ability to work. It is described as a sharp pain. Balance are normal. Otherwise she is without complaints. Past Medical History  Diagnosis Date  . GERD (gastroesophageal reflux disease)   . Depression   . Hyperlipidemia     no meds - diet  controlled  . PCOS (polycystic ovarian syndrome)   . Migraines   . SVD (spontaneous vaginal delivery)     x 2  . Tachycardia     occasional - tx with toprol xl prn -   . Anxiety   . Osteoarthritis     no med    Past Surgical History  Procedure Laterality Date  . Ovarian cyst removal    . Laparoscopy      x3 two for removal of scar tissue/adhesions  . Breast surgery      left for a papiloma: had a lump removed right breast-benign  . Wisdom tooth extraction    . Colonoscopy      x 2  . Laparoscopy  07/30/2012    Procedure: LAPAROSCOPY DIAGNOSTIC;  Surgeon: Oliver Pila, MD;  Location: [facility];  Service: Gynecology;  Laterality: N/A;    Family History  Problem Relation Age of Onset  . Diabetes Father   . Hypertension Father   . Diabetes Mother   . Hypertension Mother   . Emphysema    . Tongue cancer    . Coronary artery disease    . Asthma Paternal Grandmother   . Kidney disease Paternal Grandmother     Social History History  Substance Use Topics  . Smoking status: Former Smoker -- 0.25 packs/day for 15 years    Types: Cigarettes    Quit date: 09/26/2010  .  Smokeless tobacco: Never Used  . Alcohol Use: No    Allergies  Allergen Reactions  . Aspirin     Angiodema  . Contrast Media (Iodinated Diagnostic Agents)   . Ibuprofen     Angioedema    Current Outpatient Prescriptions  Medication Sig Dispense Refill  . ALPRAZolam (XANAX) 1 MG tablet Take 1 tablet (1 mg total) by mouth 2 (two) times daily.  30 tablet    . ARIPiprazole (ABILIFY) 5 MG tablet Take 5 mg by mouth daily.        Marland Kitchen buPROPion (WELLBUTRIN XL) 150 MG 24 hr tablet Take 2 tablets each morning and then1 tablet at 2 pm  90 tablet  5  . glucose blood test strip Check blood sugar once daily  100 each  12  . glucose blood test strip Check blood sugar daily  100 each  12  . Linaclotide (LINZESS) 290 MCG CAPS Take 1 capsule by mouth daily. Take 1 by mouth 30 mins prior to meal      . losartan (COZAAR) 25 MG tablet 1 po qd  30 tablet  3  .  metoprolol succinate (TOPROL-XL) 50 MG 24 hr tablet Take 1 tablet (50 mg total) by mouth daily as needed. Patient takes daily as needed for tachycardia  90 tablet  3  . omeprazole (PRILOSEC) 20 MG capsule 2 po qd  180 capsule  3  . ONETOUCH DELICA LANCETS FINE MISC 1 application by Does not apply route daily. Check blood sugar once a day  100 each  12  . venlafaxine XR (EFFEXOR-XR) 75 MG 24 hr capsule Take 150 mg by mouth 2 (two) times daily.      . cyclobenzaprine (FLEXERIL) 10 MG tablet Take 1 tablet (10 mg total) by mouth 3 (three) times daily as needed for muscle spasms.  30 tablet  0  . HYDROcodone-acetaminophen (NORCO) 5-325 MG per tablet Take 1 tablet by mouth every 8 (eight) hours as needed for pain.  20 tablet  0   No current facility-administered medications for this visit.    Review of Systems Review of Systems  Constitutional: Negative for fever, chills and unexpected weight change.  HENT: Negative for hearing loss, congestion, sore throat, trouble swallowing and voice change.   Eyes: Negative for visual disturbance.  Respiratory:  Negative for cough and wheezing.   Cardiovascular: Negative for chest pain, palpitations and leg swelling.  Gastrointestinal: Positive for nausea, vomiting and abdominal pain. Negative for diarrhea, constipation, blood in stool, abdominal distention and anal bleeding.  Genitourinary: Negative for hematuria, vaginal bleeding and difficulty urinating.  Musculoskeletal: Negative for arthralgias.  Skin: Negative for rash and wound.  Neurological: Negative for seizures, syncope and headaches.  Hematological: Negative for adenopathy. Does not bruise/bleed easily.  Psychiatric/Behavioral: Negative for confusion.    Blood pressure 140/88, pulse 76, temperature 97.2 F (36.2 C), temperature source Temporal, resp. rate 16, height 5\' 7"  (1.702 m), weight 218 lb (98.884 kg).  Physical Exam Physical Exam  Constitutional: She is oriented to person, place, and time. She appears well-developed and well-nourished. No distress.  obese  HENT:  Head: Normocephalic and atraumatic.  Right Ear: External ear normal.  Left Ear: External ear normal.  Nose: Nose normal.  Mouth/Throat: Oropharynx is clear and moist.  Eyes: Conjunctivae are normal. Pupils are equal, round, and reactive to light. Right eye exhibits no discharge. Left eye exhibits no discharge. No scleral icterus.  Neck: Normal range of motion. Neck supple. No tracheal deviation present. No thyromegaly present.  Cardiovascular: Normal rate, regular rhythm, normal heart sounds and intact distal pulses.   No murmur heard. Pulmonary/Chest: Effort normal and breath sounds normal. No respiratory distress. She has no wheezes. She has no rales.  Abdominal: Soft. Bowel sounds are normal. There is tenderness. There is guarding.  There is tenderness in the right upper quadrant with guarding  Musculoskeletal: Normal range of motion. She exhibits no edema and no tenderness.  Lymphadenopathy:    She has no cervical adenopathy.  Neurological: She is alert and  oriented to person, place, and time. No cranial nerve deficit. Coordination normal.  Skin: Skin is warm and dry. No rash noted. She is not diaphoretic. No erythema.  Psychiatric: Her behavior is normal. Judgment normal.    Data Reviewed I have reviewed her CAT scan, ultrasound, and HIDA scan  Assessment    Chronic cholecystitis     Plan    I do believe that her gallbladder source of her problems. I recommend  Laparoscopic cholecystectomy. I discussed the risk of surgery with her and give her literature regarding the surgery. These risks include but are not limited  to bleeding, infection, bile duct injury, bile leak, need to convert to an open procedure, the chance this may not resolve her symptoms at all, et Karie Soda. I also discussed postoperative recovery. Surgery we scheduled        Amoni Scallan A 02/17/2013, 3:15 PM

## 2013-02-19 ENCOUNTER — Telehealth: Payer: Self-pay | Admitting: Family Medicine

## 2013-02-19 NOTE — Telephone Encounter (Signed)
Patient called regarding the pain she is having on her R side (FMLA w/Dr. Loreta Ave). Dr. Kenna Gilbert office will not prescribe the patient any pain medication. Patient states she is allergic to everything except tylenol and she is taking 5-6 tylenol without relief. Patient states her pain is 9/10. Patient would like to know if something can be prescribed until her surgery is scheduled. Pt uses [facility].

## 2013-02-19 NOTE — Telephone Encounter (Signed)
Surgeon should probably do that since she is having surgery.

## 2013-02-20 NOTE — Telephone Encounter (Signed)
Patient has been aware and voiced understanding.     KP

## 2013-02-20 NOTE — Telephone Encounter (Signed)
The patient has not scheduled the surgery due to the cost of the surgery.   Please advise     KP

## 2013-02-20 NOTE — Telephone Encounter (Signed)
Pt really needs to schedule surgery.   This may finally get rid of her pain.

## 2013-02-27 ENCOUNTER — Encounter: Payer: Self-pay | Admitting: Family Medicine

## 2013-02-27 ENCOUNTER — Ambulatory Visit (INDEPENDENT_AMBULATORY_CARE_PROVIDER_SITE_OTHER): Payer: 59 | Admitting: Family Medicine

## 2013-02-27 VITALS — BP 124/88 | HR 110 | Temp 99.1°F | Wt 221.8 lb

## 2013-02-27 DIAGNOSIS — R05 Cough: Secondary | ICD-10-CM

## 2013-02-27 DIAGNOSIS — R059 Cough, unspecified: Secondary | ICD-10-CM

## 2013-02-27 DIAGNOSIS — J019 Acute sinusitis, unspecified: Secondary | ICD-10-CM

## 2013-02-27 MED ORDER — CEFUROXIME AXETIL 500 MG PO TABS: 500.0000 mg | ORAL_TABLET | Freq: Two times a day (BID) | ORAL | Status: AC

## 2013-02-27 MED ORDER — GUAIFENESIN-CODEINE 100-10 MG/5ML PO SYRP: ORAL_SOLUTION | ORAL | Status: DC

## 2013-02-27 NOTE — Progress Notes (Signed)
  Subjective:     [name] [name] is a 42 y.o. female who presents for evaluation of sinus pain. Symptoms include: congestion, cough, facial pain, nasal congestion, purulent rhinorrhea and sinus pressure. Onset of symptoms was 1 week ago. Symptoms have been gradually worsening since that time. Past history is significant for no history of pneumonia or bronchitis. Patient is a former smoker.  The following portions of the patient's history were reviewed and updated as appropriate: allergies, current medications, past family history, past medical history, past social history, past surgical history and problem list.  Review of Systems Pertinent items are noted in HPI.   Objective:    BP 124/88  Pulse 110  Temp(Src) 99.1 F (37.3 C) (Oral)  Wt 221 lb 12.8 oz (100.608 kg)  BMI 34.73 kg/m2  SpO2 97% General appearance: alert, cooperative, appears stated age and no distress Ears: dull, + fluid Nose: green discharge, moderate congestion, turbinates red, swollen, sinus tenderness bilateral Throat: lips, mucosa, and tongue normal; teeth and gums normal Neck: mild anterior cervical adenopathy, supple, symmetrical, trachea midline and thyroid not enlarged, symmetric, no tenderness/mass/nodules Lungs: clear to auscultation bilaterally Heart: S1, S2 normal    Assessment:    Acute bacterial sinusitis.    Plan:    Nasal steroids per medication orders. Antihistamines per medication orders. Ceftin per medication orders.

## 2013-02-27 NOTE — Patient Instructions (Signed)

## 2013-03-05 ENCOUNTER — Encounter (HOSPITAL_BASED_OUTPATIENT_CLINIC_OR_DEPARTMENT_OTHER): Payer: Self-pay | Admitting: *Deleted

## 2013-03-11 ENCOUNTER — Encounter (HOSPITAL_BASED_OUTPATIENT_CLINIC_OR_DEPARTMENT_OTHER)
Admission: RE | Admit: 2013-03-11 | Discharge: 2013-03-11 | Disposition: A | Discharge status: Home / Self Care | Payer: 59 | Source: Ambulatory Visit | Attending: Surgery | Admitting: Surgery

## 2013-03-11 LAB — BASIC METABOLIC PANEL
BUN: 7 mg/dL (ref 6–23)
Chloride: 102 mEq/L (ref 96–112)
GFR calc Af Amer: >90 mL/min (ref >90)
GFR calc non Af Amer: 88 mL/min — ABNORMAL LOW (ref >90)
Potassium: 3.9 mEq/L (ref 3.5–5.1)
Sodium: 139 mEq/L (ref 135–145)

## 2013-03-12 NOTE — H&P (Signed)
Patient ID: [name] [name], female DOB: 06-09-1971, 42 y.o. MRN: [id number]  Chief Complaint   Patient presents with   .  New Evaluation     eval of low GB ejection fraction   HPI  [name] [name] is a 42 y.o. female.  HPI  This is a pleasant female referred by Dr. Arty Baumgartner. She has been having right-sided abdominal pain her increase in the back with nausea for several years. She has had an extensive workup including laparoscopy which has been unremarkable. It is difficult to relate as to what she is also now hurts every single day. This is starting to limit her ability to work. It is described as a sharp pain. Balance are normal. Otherwise she is without complaints.  Past Medical History   Diagnosis  Date   .  GERD (gastroesophageal reflux disease)    .  Depression    .  Hyperlipidemia      no meds - diet controlled   .  PCOS (polycystic ovarian syndrome)    .  Migraines    .  SVD (spontaneous vaginal delivery)      x 2   .  Tachycardia      occasional - tx with toprol xl prn -   .  Anxiety    .  Osteoarthritis      no med    Past Surgical History   Procedure  Laterality  Date   .  Ovarian cyst removal     .  Laparoscopy       x3 two for removal of scar tissue/adhesions   .  Breast surgery       left for a papiloma: had a lump removed right breast-benign   .  Wisdom tooth extraction     .  Colonoscopy       x 2   .  Laparoscopy   07/30/2012     Procedure: LAPAROSCOPY DIAGNOSTIC; Surgeon: Oliver Pila, MD; Location: [facility]; Service: Gynecology; Laterality: N/A;    Family History   Problem  Relation  Age of Onset   .  Diabetes  Father    .  Hypertension  Father    .  Diabetes  Mother    .  Hypertension  Mother    .  Emphysema     .  Tongue cancer     .  Coronary artery disease     .  Asthma  Paternal Grandmother    .  Kidney disease  Paternal Grandmother    Social History  History   Substance Use Topics   .  Smoking status:  Former Smoker -- 0.25 packs/day  for 15 years     Types:  Cigarettes     Quit date:  09/26/2010   .  Smokeless tobacco:  Never Used   .  Alcohol Use:  No    Allergies   Allergen  Reactions   .  Aspirin      Angiodema   .  Contrast Media (Iodinated Diagnostic Agents)    .  Ibuprofen      Angioedema    Current Outpatient Prescriptions   Medication  Sig  Dispense  Refill   .  ALPRAZolam (XANAX) 1 MG tablet  Take 1 tablet (1 mg total) by mouth 2 (two) times daily.  30 tablet    .  ARIPiprazole (ABILIFY) 5 MG tablet  Take 5 mg by mouth daily.     Marland Kitchen  buPROPion [facility]  XL) 150 MG 24 hr tablet  Take 2 tablets each morning and then1 tablet at 2 pm  90 tablet  5   .  glucose blood test strip  Check blood sugar once daily  100 each  12   .  glucose blood test strip  Check blood sugar daily  100 each  12   .  Linaclotide (LINZESS) 290 MCG CAPS  Take 1 capsule by mouth daily. Take 1 by mouth 30 mins prior to meal     .  losartan (COZAAR) 25 MG tablet  1 po qd  30 tablet  3   .  metoprolol succinate (TOPROL-XL) 50 MG 24 hr tablet  Take 1 tablet (50 mg total) by mouth daily as needed. Patient takes daily as needed for tachycardia  90 tablet  3   .  omeprazole (PRILOSEC) 20 MG capsule  2 po qd  180 capsule  3   .  ONETOUCH DELICA LANCETS FINE MISC  1 application by Does not apply route daily. Check blood sugar once a day  100 each  12   .  venlafaxine XR (EFFEXOR-XR) 75 MG 24 hr capsule  Take 150 mg by mouth 2 (two) times daily.     .  cyclobenzaprine (FLEXERIL) 10 MG tablet  Take 1 tablet (10 mg total) by mouth 3 (three) times daily as needed for muscle spasms.  30 tablet  0   .  HYDROcodone-acetaminophen (NORCO) 5-325 MG per tablet  Take 1 tablet by mouth every 8 (eight) hours as needed for pain.  20 tablet  0    No current facility-administered medications for this visit.   Review of Systems  Review of Systems  Constitutional: Negative for fever, chills and unexpected weight change.  HENT: Negative for hearing loss,  congestion, sore throat, trouble swallowing and voice change.  Eyes: Negative for visual disturbance.  Respiratory: Negative for cough and wheezing.  Cardiovascular: Negative for chest pain, palpitations and leg swelling.  Gastrointestinal: Positive for nausea, vomiting and abdominal pain. Negative for diarrhea, constipation, blood in stool, abdominal distention and anal bleeding.  Genitourinary: Negative for hematuria, vaginal bleeding and difficulty urinating.  Musculoskeletal: Negative for arthralgias.  Skin: Negative for rash and wound.  Neurological: Negative for seizures, syncope and headaches.  Hematological: Negative for adenopathy. Does not bruise/bleed easily.  Psychiatric/Behavioral: Negative for confusion.  Blood pressure 140/88, pulse 76, temperature 97.2 F (36.2 C), temperature source Temporal, resp. rate 16, height 5\' 7"  (1.702 m), weight 218 lb (98.884 kg).  Physical Exam  Physical Exam  Constitutional: She is oriented to person, place, and time. She appears well-developed and well-nourished. No distress.  obese  HENT:  Head: Normocephalic and atraumatic.  Right Ear: External ear normal.  Left Ear: External ear normal.  Nose: Nose normal.  Mouth/Throat: Oropharynx is clear and moist.  Eyes: Conjunctivae are normal. Pupils are equal, round, and reactive to light. Right eye exhibits no discharge. Left eye exhibits no discharge. No scleral icterus.  Neck: Normal range of motion. Neck supple. No tracheal deviation present. No thyromegaly present.  Cardiovascular: Normal rate, regular rhythm, normal heart sounds and intact distal pulses.  No murmur heard.  Pulmonary/Chest: Effort normal and breath sounds normal. No respiratory distress. She has no wheezes. She has no rales.  Abdominal: Soft. Bowel sounds are normal. There is tenderness. There is guarding.  There is tenderness in the right upper quadrant with guarding  Musculoskeletal: Normal range of motion. She exhibits no  edema and  no tenderness.  Lymphadenopathy:  She has no cervical adenopathy.  Neurological: She is alert and oriented to person, place, and time. No cranial nerve deficit. Coordination normal.  Skin: Skin is warm and dry. No rash noted. She is not diaphoretic. No erythema.  Psychiatric: Her behavior is normal. Judgment normal.  Data Reviewed  I have reviewed her CAT scan, ultrasound, and HIDA scan  Assessment  Chronic cholecystitis  Plan  I do believe that her gallbladder source of her problems. I recommend Laparoscopic cholecystectomy. I discussed the risk of surgery with her and give her literature regarding the surgery. These risks include but are not limited to bleeding, infection, bile duct injury, bile leak, need to convert to an open procedure, the chance this may not resolve her symptoms at all, et Karie Soda. I also discussed postoperative recovery. Surgery we scheduled

## 2013-03-13 ENCOUNTER — Ambulatory Visit (INDEPENDENT_AMBULATORY_CARE_PROVIDER_SITE_OTHER): Payer: Self-pay | Admitting: Surgery

## 2013-03-13 ENCOUNTER — Ambulatory Visit (HOSPITAL_BASED_OUTPATIENT_CLINIC_OR_DEPARTMENT_OTHER)
Admission: RE | Admit: 2013-03-13 | Discharge: 2013-03-13 | Disposition: A | Discharge status: Home / Self Care | Payer: 59 | Source: Ambulatory Visit | Attending: Surgery | Admitting: Surgery

## 2013-03-13 ENCOUNTER — Encounter (HOSPITAL_BASED_OUTPATIENT_CLINIC_OR_DEPARTMENT_OTHER): Payer: Self-pay | Admitting: Certified Registered Nurse Anesthetist

## 2013-03-13 ENCOUNTER — Ambulatory Visit (HOSPITAL_BASED_OUTPATIENT_CLINIC_OR_DEPARTMENT_OTHER): Payer: 59 | Admitting: Certified Registered Nurse Anesthetist

## 2013-03-13 ENCOUNTER — Encounter (HOSPITAL_BASED_OUTPATIENT_CLINIC_OR_DEPARTMENT_OTHER): Payer: Self-pay | Admitting: *Deleted

## 2013-03-13 ENCOUNTER — Encounter (HOSPITAL_BASED_OUTPATIENT_CLINIC_OR_DEPARTMENT_OTHER)
Admission: RE | Disposition: A | Discharge status: Home / Self Care | Payer: Self-pay | Source: Ambulatory Visit | Attending: Surgery

## 2013-03-13 DIAGNOSIS — M1990 Unspecified osteoarthritis, unspecified site: Secondary | ICD-10-CM | POA: Insufficient documentation

## 2013-03-13 DIAGNOSIS — K811 Chronic cholecystitis: Secondary | ICD-10-CM

## 2013-03-13 DIAGNOSIS — F3289 Other specified depressive episodes: Secondary | ICD-10-CM | POA: Insufficient documentation

## 2013-03-13 DIAGNOSIS — F329 Major depressive disorder, single episode, unspecified: Secondary | ICD-10-CM | POA: Insufficient documentation

## 2013-03-13 DIAGNOSIS — F411 Generalized anxiety disorder: Secondary | ICD-10-CM | POA: Insufficient documentation

## 2013-03-13 DIAGNOSIS — E785 Hyperlipidemia, unspecified: Secondary | ICD-10-CM | POA: Insufficient documentation

## 2013-03-13 DIAGNOSIS — Z87891 Personal history of nicotine dependence: Secondary | ICD-10-CM | POA: Insufficient documentation

## 2013-03-13 DIAGNOSIS — K219 Gastro-esophageal reflux disease without esophagitis: Secondary | ICD-10-CM | POA: Insufficient documentation

## 2013-03-13 DIAGNOSIS — R000 Tachycardia, unspecified: Secondary | ICD-10-CM | POA: Insufficient documentation

## 2013-03-13 DIAGNOSIS — K824 Cholesterolosis of gallbladder: Secondary | ICD-10-CM

## 2013-03-13 HISTORY — PX: CHOLECYSTECTOMY: SHX55

## 2013-03-13 LAB — POCT HEMOGLOBIN-HEMACUE: Hemoglobin: 11.6 g/dL — ABNORMAL LOW (ref 12.0–15.0)

## 2013-03-13 SURGERY — LAPAROSCOPIC CHOLECYSTECTOMY
Anesthesia: General | Wound class: Contaminated

## 2013-03-13 MED ORDER — SODIUM CHLORIDE 0.9 % IR SOLN
Status: DC | PRN
  Administered 2013-03-13: 1000 mL

## 2013-03-13 MED ORDER — ACETAMINOPHEN 650 MG RE SUPP: 650.0000 mg | RECTAL | Status: DC | PRN

## 2013-03-13 MED ORDER — METOCLOPRAMIDE HCL 5 MG/ML IJ SOLN: 10.0000 mg | Freq: Once | INTRAMUSCULAR | Status: DC | PRN

## 2013-03-13 MED ORDER — OXYCODONE HCL 5 MG/5ML PO SOLN: 5.0000 mg | Freq: Once | ORAL | Status: AC | PRN

## 2013-03-13 MED ORDER — OXYCODONE HCL 5 MG PO TABS
5.0000 mg | ORAL_TABLET | Freq: Once | ORAL | Status: AC | PRN
  Administered 2013-03-13: 5 mg via ORAL

## 2013-03-13 MED ORDER — LACTATED RINGERS IV SOLN
INTRAVENOUS | Status: DC
  Administered 2013-03-13 (×2): via INTRAVENOUS

## 2013-03-13 MED ORDER — SODIUM CHLORIDE 0.9 % IJ SOLN: 3.0000 mL | INTRAMUSCULAR | Status: DC | PRN

## 2013-03-13 MED ORDER — FENTANYL CITRATE 0.05 MG/ML IJ SOLN: 50.0000 ug | INTRAMUSCULAR | Status: DC | PRN

## 2013-03-13 MED ORDER — ONDANSETRON HCL 4 MG/2ML IJ SOLN
INTRAMUSCULAR | Status: DC | PRN
  Administered 2013-03-13: 4 mg via INTRAVENOUS

## 2013-03-13 MED ORDER — NEOSTIGMINE METHYLSULFATE 1 MG/ML IJ SOLN
INTRAMUSCULAR | Status: DC | PRN
  Administered 2013-03-13: 4 mg via INTRAVENOUS

## 2013-03-13 MED ORDER — BUPIVACAINE-EPINEPHRINE PF 0.5-1:200000 % IJ SOLN
INTRAMUSCULAR | Status: DC | PRN
  Administered 2013-03-13: 20 mL

## 2013-03-13 MED ORDER — OXYCODONE-ACETAMINOPHEN 5-325 MG PO TABS: 1.0000 | ORAL_TABLET | ORAL | Status: DC | PRN

## 2013-03-13 MED ORDER — ACETAMINOPHEN 325 MG PO TABS: 650.0000 mg | ORAL_TABLET | ORAL | Status: DC | PRN

## 2013-03-13 MED ORDER — SODIUM CHLORIDE 0.9 % IV SOLN: 250.0000 mL | INTRAVENOUS | Status: DC | PRN

## 2013-03-13 MED ORDER — MORPHINE SULFATE 4 MG/ML IJ SOLN: 4.0000 mg | INTRAMUSCULAR | Status: DC | PRN

## 2013-03-13 MED ORDER — CEFAZOLIN SODIUM-DEXTROSE 2-3 GM-% IV SOLR
2.0000 g | INTRAVENOUS | Status: AC
  Administered 2013-03-13: 2 g via INTRAVENOUS

## 2013-03-13 MED ORDER — MIDAZOLAM HCL 5 MG/5ML IJ SOLN
INTRAMUSCULAR | Status: DC | PRN
  Administered 2013-03-13: 2 mg via INTRAVENOUS

## 2013-03-13 MED ORDER — PROPOFOL 10 MG/ML IV BOLUS
INTRAVENOUS | Status: DC | PRN
  Administered 2013-03-13: 220 mg via INTRAVENOUS

## 2013-03-13 MED ORDER — DEXAMETHASONE SODIUM PHOSPHATE 4 MG/ML IJ SOLN
INTRAMUSCULAR | Status: DC | PRN
  Administered 2013-03-13: 10 mg via INTRAVENOUS

## 2013-03-13 MED ORDER — FENTANYL CITRATE 0.05 MG/ML IJ SOLN
INTRAMUSCULAR | Status: DC | PRN
  Administered 2013-03-13: 100 ug via INTRAVENOUS

## 2013-03-13 MED ORDER — SODIUM CHLORIDE 0.9 % IJ SOLN: 3.0000 mL | Freq: Two times a day (BID) | INTRAMUSCULAR | Status: DC

## 2013-03-13 MED ORDER — LIDOCAINE HCL (CARDIAC) 20 MG/ML IV SOLN
INTRAVENOUS | Status: DC | PRN
  Administered 2013-03-13: 60 mg via INTRAVENOUS

## 2013-03-13 MED ORDER — LABETALOL HCL 5 MG/ML IV SOLN
5.0000 mg | INTRAVENOUS | Status: DC | PRN
  Administered 2013-03-13 (×2): 5 mg via INTRAVENOUS

## 2013-03-13 MED ORDER — ONDANSETRON HCL 4 MG/2ML IJ SOLN: 4.0000 mg | Freq: Four times a day (QID) | INTRAMUSCULAR | Status: DC | PRN

## 2013-03-13 MED ORDER — OXYCODONE HCL 5 MG PO TABS: 5.0000 mg | ORAL_TABLET | ORAL | Status: DC | PRN

## 2013-03-13 MED ORDER — ROCURONIUM BROMIDE 100 MG/10ML IV SOLN
INTRAVENOUS | Status: DC | PRN
  Administered 2013-03-13: 50 mg via INTRAVENOUS

## 2013-03-13 MED ORDER — MIDAZOLAM HCL 2 MG/2ML IJ SOLN: 1.0000 mg | INTRAMUSCULAR | Status: DC | PRN

## 2013-03-13 MED ORDER — HYDROMORPHONE HCL PF 1 MG/ML IJ SOLN
0.2500 mg | INTRAMUSCULAR | Status: DC | PRN
Start: 2013-03-13 — End: 2013-03-13
  Administered 2013-03-13 (×3): 0.5 mg via INTRAVENOUS

## 2013-03-13 MED ORDER — GLYCOPYRROLATE 0.2 MG/ML IJ SOLN
INTRAMUSCULAR | Status: DC | PRN
  Administered 2013-03-13: 0.6 mg via INTRAVENOUS

## 2013-03-13 SURGICAL SUPPLY — 40 items
APL SKNCLS STERI-STRIP NONHPOA (GAUZE/BANDAGES/DRESSINGS) ×1
APPLIER CLIP 5 13 M/L LIGAMAX5 (MISCELLANEOUS) ×2
APR CLP MED LRG 5 ANG JAW (MISCELLANEOUS) ×1
BAG SPEC RTRVL LRG 6X4 10 (ENDOMECHANICALS) ×1
BANDAGE ADHESIVE 1X3 (GAUZE/BANDAGES/DRESSINGS) ×8 IMPLANT
BENZOIN TINCTURE PRP APPL 2/3 (GAUZE/BANDAGES/DRESSINGS) ×2 IMPLANT
BLADE SURG ROTATE 9660 (MISCELLANEOUS) IMPLANT
CANISTER SUCTION 2500CC (MISCELLANEOUS) ×2 IMPLANT
CHLORAPREP W/TINT 26ML (MISCELLANEOUS) ×2 IMPLANT
CLIP APPLIE 5 13 M/L LIGAMAX5 (MISCELLANEOUS) ×1 IMPLANT
CLOTH BEACON ORANGE TIMEOUT ST (SAFETY) ×2 IMPLANT
COVER MAYO STAND STRL (DRAPES) IMPLANT
DECANTER SPIKE VIAL GLASS SM (MISCELLANEOUS) IMPLANT
DRAPE C-ARM 42X72 X-RAY (DRAPES) IMPLANT
DRAPE UTILITY XL STRL (DRAPES) ×2 IMPLANT
ELECT REM PT RETURN 9FT ADLT (ELECTROSURGICAL) ×2
ELECTRODE REM PT RTRN 9FT ADLT (ELECTROSURGICAL) ×1 IMPLANT
FILTER SMOKE EVAC LAPAROSHD (FILTER) IMPLANT
GLOVE BIOGEL PI IND STRL 6.5 (GLOVE) ×2 IMPLANT
GLOVE BIOGEL PI INDICATOR 6.5 (GLOVE) ×2
GLOVE ECLIPSE 6.5 STRL STRAW (GLOVE) ×4 IMPLANT
GLOVE EXAM NITRILE PF MED BLUE (GLOVE) ×2 IMPLANT
GLOVE SURG SIGNA 7.5 PF LTX (GLOVE) ×2 IMPLANT
GOWN PREVENTION PLUS XLARGE (GOWN DISPOSABLE) ×6 IMPLANT
NS IRRIG 1000ML POUR BTL (IV SOLUTION) ×2 IMPLANT
PACK BASIN DAY SURGERY FS (CUSTOM PROCEDURE TRAY) ×2 IMPLANT
POUCH SPECIMEN RETRIEVAL 10MM (ENDOMECHANICALS) ×2 IMPLANT
SCISSORS LAP 5X35 DISP (ENDOMECHANICALS) IMPLANT
SET CHOLANGIOGRAPH 5 50 .035 (SET/KITS/TRAYS/PACK) IMPLANT
SET IRRIG TUBING LAPAROSCOPIC (IRRIGATION / IRRIGATOR) ×2 IMPLANT
SLEEVE ENDOPATH XCEL 5M (ENDOMECHANICALS) ×4 IMPLANT
SLEEVE SCD COMPRESS KNEE MED (MISCELLANEOUS) ×2 IMPLANT
SPECIMEN JAR SMALL (MISCELLANEOUS) ×2 IMPLANT
SUT MON AB 4-0 PC3 18 (SUTURE) ×2 IMPLANT
TOWEL OR 17X24 6PK STRL BLUE (TOWEL DISPOSABLE) ×2 IMPLANT
TOWEL OR NON WOVEN STRL DISP B (DISPOSABLE) ×2 IMPLANT
TRAY LAPAROSCOPIC (CUSTOM PROCEDURE TRAY) ×2 IMPLANT
TROCAR XCEL BLUNT TIP 100MML (ENDOMECHANICALS) ×2 IMPLANT
TROCAR XCEL NON-BLD 5MMX100MML (ENDOMECHANICALS) ×2 IMPLANT
TUBE CONNECTING 20X1/4 (TUBING) ×2 IMPLANT

## 2013-03-13 NOTE — Op Note (Signed)
Laparoscopic Cholecystectomy Procedure Note  Indications: This patient presents with symptomatic gallbladder disease and will undergo laparoscopic cholecystectomy.  Pre-operative Diagnosis: biliary dyskinesia  Post-operative Diagnosis: Same  Surgeon: Abigail Miyamoto A   Assistants: 0  Anesthesia: General endotracheal anesthesia  ASA Class: 1  Procedure Details  The patient was seen again in the Holding Room. The risks, benefits, complications, treatment options, and expected outcomes were discussed with the patient. The possibilities of reaction to medication, pulmonary aspiration, perforation of viscus, bleeding, recurrent infection, finding a normal gallbladder, the need for additional procedures, failure to diagnose a condition, the possible need to convert to an open procedure, and creating a complication requiring transfusion or operation were discussed with the patient. The likelihood of improving the patient's symptoms with return to their baseline status is good.  The patient and/or family concurred with the proposed plan, giving informed consent. The site of surgery properly noted. The patient was taken to Operating Room, identified as [name] [name] and the procedure verified as Laparoscopic Cholecystectomy with Intraoperative Cholangiogram. A Time Out was held and the above information confirmed.  Prior to the induction of general anesthesia, antibiotic prophylaxis was administered. General endotracheal anesthesia was then administered and tolerated well. After the induction, the abdomen was prepped with Chloraprep and draped in sterile fashion. The patient was positioned in the supine position.  Local anesthetic agent was injected into the skin near the umbilicus and an incision made. We dissected down to the abdominal fascia with blunt dissection.  The fascia was incised vertically and we entered the peritoneal cavity bluntly.  A pursestring suture of 0-Vicryl was placed around  the fascial opening.  The Hasson cannula was inserted and secured with the stay suture.  Pneumoperitoneum was then created with CO2 and tolerated well without any adverse changes in the patient's vital signs. An 11-mm port was placed in the subxiphoid position.  Two 5-mm ports were placed in the right upper quadrant. All skin incisions were infiltrated with a local anesthetic agent before making the incision and placing the trocars.   We positioned the patient in reverse Trendelenburg, tilted slightly to the patient's left.  The gallbladder was identified, the fundus grasped and retracted cephalad. Adhesions were lysed bluntly and with the electrocautery where indicated, taking care not to injure any adjacent organs or viscus. The infundibulum was grasped and retracted laterally, exposing the peritoneum overlying the triangle of Calot. This was then divided and exposed in a blunt fashion. The cystic duct was clearly identified and bluntly dissected circumferentially. A critical view of the cystic duct and cystic artery was obtained.  The cystic duct was then ligated with clips and divided. The cystic artery was, dissected free, ligated with clips and divided as well.   The gallbladder was dissected from the liver bed in retrograde fashion with the electrocautery. The gallbladder was removed and placed in an Endocatch sac. The liver bed was irrigated and inspected. Hemostasis was achieved with the electrocautery. Copious irrigation was utilized and was repeatedly aspirated until clear.  The gallbladder and Endocatch sac were then removed through the umbilical port site.  The pursestring suture was used to close the umbilical fascia.    We again inspected the right upper quadrant for hemostasis.  Pneumoperitoneum was released as we removed the trocars.  4-0 Monocryl was used to close the skin.   Benzoin, steri-strips, and clean dressings were applied. The patient was then extubated and brought to the recovery  room in stable condition. Instrument, sponge, and needle  counts were correct at closure and at the conclusion of the case.   Findings: Chronic Cholecystitis without Cholelithiasis  Estimated Blood Loss: Minimal         Drains: 0         Specimens: Gallbladder           Complications: None; patient tolerated the procedure well.         Disposition: PACU - hemodynamically stable.         Condition: stable

## 2013-03-13 NOTE — Interval H&P Note (Signed)
History and Physical Interval Note: no change in H and P  03/13/2013 11:01 AM  [name] [name]  has presented today for surgery, with the diagnosis of chronic cholecystitis  The various methods of treatment have been discussed with the patient and family. After consideration of risks, benefits and other options for treatment, the patient has consented to  Procedure(s): LAPAROSCOPIC CHOLECYSTECTOMY (N/A) as a surgical intervention .  The patient's history has been reviewed, patient examined, no change in status, stable for surgery.  I have reviewed the patient's chart and labs.  Questions were answered to the patient's satisfaction.     Kymari Lollis A

## 2013-03-13 NOTE — Transfer of Care (Signed)
Immediate Anesthesia Transfer of Care Note  Patient: [name] [name]  Procedure(s) Performed: Procedure(s): LAPAROSCOPIC CHOLECYSTECTOMY (N/A)  Patient Location: PACU  Anesthesia Type:General  Level of Consciousness: awake, alert , oriented and patient cooperative  Airway & Oxygen Therapy: Patient Spontanous Breathing and Patient connected to face mask oxygen  Post-op Assessment: Report given to PACU RN and Post -op Vital signs reviewed and stable  Post vital signs: Reviewed and stable  Complications: No apparent anesthesia complications

## 2013-03-13 NOTE — Anesthesia Preprocedure Evaluation (Addendum)
Anesthesia Evaluation  Patient identified by MRN, date of birth, ID band Patient awake    Reviewed: Allergy & Precautions, H&P , NPO status , Patient's Chart, lab work & pertinent test results, reviewed documented beta blocker date and time   Airway Mallampati: II TM Distance: >3 FB Neck ROM: full    Dental   Pulmonary sleep apnea ,  breath sounds clear to auscultation        Cardiovascular hypertension, Pt. on home beta blockers and On Medications + dysrhythmias Rhythm:regular     Neuro/Psych  Headaches, PSYCHIATRIC DISORDERS Anxiety Depression  Neuromuscular disease    GI/Hepatic Neg liver ROS, GERD-  Medicated,  Endo/Other  diabetes, Type 2Hypothyroidism   Renal/GU Renal disease  negative genitourinary   Musculoskeletal   Abdominal   Peds  Hematology  (+) Blood dyscrasia, ,   Anesthesia Other Findings See surgeon's H&P   Reproductive/Obstetrics negative OB ROS                           Anesthesia Physical Anesthesia Plan  ASA: III  Anesthesia Plan: General   Post-op Pain Management:    Induction: Intravenous  Airway Management Planned: Oral ETT  Additional Equipment:   Intra-op Plan:   Post-operative Plan: Extubation in OR  Informed Consent: I have reviewed the patients History and Physical, chart, labs and discussed the procedure including the risks, benefits and alternatives for the proposed anesthesia with the patient or authorized representative who has indicated his/her understanding and acceptance.   Dental Advisory Given  Plan Discussed with: CRNA and Surgeon  Anesthesia Plan Comments:         Anesthesia Quick Evaluation

## 2013-03-13 NOTE — Anesthesia Postprocedure Evaluation (Signed)
Anesthesia Post Note  Patient: [name] [name]  Procedure(s) Performed: Procedure(s) (LRB): LAPAROSCOPIC CHOLECYSTECTOMY (N/A)  Anesthesia type: General  Patient location: PACU  Post pain: Pain level controlled  Post assessment: Patient's Cardiovascular Status Stable  Last Vitals:  Filed Vitals:   03/13/13 1405  BP:   Pulse: 71  Temp:   Resp: 15    Post vital signs: Reviewed and stable  Level of consciousness: alert  Complications: No apparent anesthesia complications

## 2013-03-13 NOTE — Anesthesia Procedure Notes (Signed)
Procedure Name: Intubation Date/Time: 03/13/2013 12:00 PM Performed by: Anisa Leanos D Pre-anesthesia Checklist: Patient identified, Emergency Drugs available, Suction available and Patient being monitored Patient Re-evaluated:Patient Re-evaluated prior to inductionOxygen Delivery Method: Circle System Utilized Preoxygenation: Pre-oxygenation with 100% oxygen Intubation Type: IV induction Ventilation: Mask ventilation without difficulty Laryngoscope Size: Mac and 3 Grade View: Grade I Tube type: Oral Tube size: 7.0 mm Number of attempts: 1 Airway Equipment and Method: stylet and LTA kit utilized Placement Confirmation: ETT inserted through vocal cords under direct vision,  positive ETCO2 and breath sounds checked- equal and bilateral Secured at: 21 cm Tube secured with: Tape Dental Injury: Teeth and Oropharynx as per pre-operative assessment

## 2013-03-14 ENCOUNTER — Telehealth (INDEPENDENT_AMBULATORY_CARE_PROVIDER_SITE_OTHER): Payer: Self-pay | Admitting: Surgery

## 2013-03-14 ENCOUNTER — Encounter (HOSPITAL_BASED_OUTPATIENT_CLINIC_OR_DEPARTMENT_OTHER): Payer: Self-pay | Admitting: Surgery

## 2013-03-14 ENCOUNTER — Telehealth (INDEPENDENT_AMBULATORY_CARE_PROVIDER_SITE_OTHER): Payer: Self-pay

## 2013-03-14 NOTE — Telephone Encounter (Signed)
Patient called.  She had called a few times through the day.  She forgot to mention that she was having some drainage from her bellybutton.  She also notes that she is urinating a lot.  When she coughs she will leak.  Never had that before.  No burning.  No hematuria.  No dysuria.  Never had urinary tract infection for that she can recall.  Tolerating by mouth.  Pain control.  Using an ice pack.  No fevers chills or sweats.  Concerned.  Anxious.  I recommended she stick to liquids.  Continue ice pack.  Use pain medicines to control soreness.  Drainage from incision is normal and that should calm down.  Consider cough suppressant to help control her cough since that seems to make sure it is worse.  If getting much worse, consider ER evaluation.  She said thanks and hung up  Ardeth Sportsman, M.D., F.A.C.S. Gastrointestinal and Minimally Invasive Surgery [facility] [address] [place], [place] [address] [phone] Main / Paging

## 2013-03-14 NOTE — Telephone Encounter (Signed)
Pt called back again b/c she has been spitting up little amount of blood with the reflux. The pt has been coughing for about . Since she has been a [facility]. I asked Dr Magnus Ivan about the blood and he said the pt is prob. Having irritation from the intubation from the surgery. I advised the pt that if the bleeding gets worse she needs to go to the ER. The pt understands.

## 2013-03-14 NOTE — Telephone Encounter (Signed)
Pt calling b/c she is having severe acid reflux after lap chole done yesterday. The pt has been taking the Protonix TID today but no relief and she is crying for some relief. I asked Dr Magnus Ivan about what we could advise pt and he said the pt needed to get Maalox w/Zantac otc along with the Protonix. The pt understands.

## 2013-03-27 ENCOUNTER — Encounter (INDEPENDENT_AMBULATORY_CARE_PROVIDER_SITE_OTHER): Payer: Self-pay

## 2013-03-27 ENCOUNTER — Telehealth (INDEPENDENT_AMBULATORY_CARE_PROVIDER_SITE_OTHER): Payer: Self-pay

## 2013-03-27 NOTE — Telephone Encounter (Signed)
The pt called and requested a note to return to work tomorrow.  She wants it faxed to Janna Arch at [phone].  Letter was typed and faxed.

## 2013-03-28 ENCOUNTER — Ambulatory Visit (INDEPENDENT_AMBULATORY_CARE_PROVIDER_SITE_OTHER): Payer: Commercial Managed Care - PPO | Admitting: Surgery

## 2013-03-28 VITALS — BP 128/70 | HR 72 | Temp 98.3°F | Resp 18 | Ht 66.0 in | Wt 217.0 lb

## 2013-03-28 DIAGNOSIS — Z09 Encounter for follow-up examination after completed treatment for conditions other than malignant neoplasm: Secondary | ICD-10-CM

## 2013-03-28 NOTE — Progress Notes (Signed)
Subjective:     Patient ID: [name] [name], female   DOB: 07-Mar-1971, 42 y.o.   MRN: [id number]  HPI She is here for first postop visit status post laparoscopic cholecystectomy. She is doing well on all her symptoms preoperatively have resolved  Review of Systems     Objective:   Physical Exam On exam, her incisions are well-healed. The final pathology showed chronic cholecystitis    Assessment:     Patient stable postop     Plan:     She may resume normal activity. I will see her back as needed

## 2013-04-07 ENCOUNTER — Ambulatory Visit (INDEPENDENT_AMBULATORY_CARE_PROVIDER_SITE_OTHER): Payer: 59 | Admitting: Family Medicine

## 2013-04-07 ENCOUNTER — Encounter: Payer: Self-pay | Admitting: Family Medicine

## 2013-04-07 VITALS — BP 128/74 | HR 96 | Temp 98.4°F | Wt 216.0 lb

## 2013-04-07 DIAGNOSIS — E538 Deficiency of other specified B group vitamins: Secondary | ICD-10-CM

## 2013-04-07 DIAGNOSIS — R519 Headache, unspecified: Secondary | ICD-10-CM

## 2013-04-07 DIAGNOSIS — R110 Nausea: Secondary | ICD-10-CM

## 2013-04-07 DIAGNOSIS — R000 Tachycardia, unspecified: Secondary | ICD-10-CM

## 2013-04-07 DIAGNOSIS — R51 Headache: Secondary | ICD-10-CM

## 2013-04-07 DIAGNOSIS — R42 Dizziness and giddiness: Secondary | ICD-10-CM

## 2013-04-07 DIAGNOSIS — I498 Other specified cardiac arrhythmias: Secondary | ICD-10-CM

## 2013-04-07 LAB — HEPATIC FUNCTION PANEL
ALT: 24 U/L (ref 0–35)
AST: 19 U/L (ref 0–37)
Alkaline Phosphatase: 72 U/L (ref 39–117)
Bilirubin, Direct: 0 mg/dL (ref 0.0–0.3)
Total Bilirubin: 0.6 mg/dL (ref 0.3–1.2)

## 2013-04-07 LAB — BASIC METABOLIC PANEL
BUN: 14 mg/dL (ref 6–23)
Calcium: 9 mg/dL (ref 8.4–10.5)
GFR: 68.58 mL/min (ref >60.00)
Potassium: 3.7 mEq/L (ref 3.5–5.1)
Sodium: 137 mEq/L (ref 135–145)

## 2013-04-07 LAB — CBC WITH DIFFERENTIAL/PLATELET
Basophils Absolute: 0 10*3/uL (ref 0.0–0.1)
Eosinophils Relative: 2.6 % (ref 0.0–5.0)
HCT: 38.7 % (ref 36.0–46.0)
Hemoglobin: 13.2 g/dL (ref 12.0–15.0)
Lymphocytes Relative: 21 % (ref 12.0–46.0)
Lymphs Abs: 2.2 10*3/uL (ref 0.7–4.0)
Monocytes Relative: 4.8 % (ref 3.0–12.0)
Platelets: 354 10*3/uL (ref 150.0–400.0)
RDW: 13.8 % (ref 11.5–14.6)
WBC: 10.4 10*3/uL (ref 4.5–10.5)

## 2013-04-07 LAB — TSH: TSH: 1.81 u[IU]/mL (ref 0.35–5.50)

## 2013-04-07 MED ORDER — CYANOCOBALAMIN 1000 MCG/ML IJ SOLN
1000.0000 ug | Freq: Once | INTRAMUSCULAR | Status: AC
  Administered 2013-04-07: 1000 ug via INTRAMUSCULAR

## 2013-04-07 MED ORDER — PROMETHAZINE HCL 25 MG PO TABS: 25.0000 mg | ORAL_TABLET | Freq: Three times a day (TID) | ORAL | Status: DC | PRN

## 2013-04-07 MED ORDER — MECLIZINE HCL 25 MG PO TABS: 25.0000 mg | ORAL_TABLET | Freq: Three times a day (TID) | ORAL | Status: DC | PRN

## 2013-04-07 MED ORDER — METOPROLOL SUCCINATE ER 50 MG PO TB24: ORAL_TABLET | ORAL | Status: DC

## 2013-04-07 NOTE — Assessment & Plan Note (Signed)
Increase Metoprolol to 50 mg bid

## 2013-04-07 NOTE — Patient Instructions (Addendum)
Vertigo Vertigo means you feel like you or your surroundings are moving when they are not. Vertigo can be dangerous if it occurs when you are at work, driving, or performing difficult activities.  CAUSES  Vertigo occurs when there is a conflict of signals sent to your brain from the visual and sensory systems in your body. There are many different causes of vertigo, including:  Infections, especially in the inner ear.  A bad reaction to a drug or misuse of alcohol and medicines.  Withdrawal from drugs or alcohol.  Rapidly changing positions, such as lying down or rolling over in bed.  A migraine headache.  Decreased blood flow to the brain.  Increased pressure in the brain from a head injury, infection, tumor, or bleeding. SYMPTOMS  You may feel as though the world is spinning around or you are falling to the ground. Because your balance is upset, vertigo can cause nausea and vomiting. You may have involuntary eye movements (nystagmus). DIAGNOSIS  Vertigo is usually diagnosed by physical exam. If the cause of your vertigo is unknown, your caregiver may perform imaging tests, such as an MRI scan (magnetic resonance imaging). TREATMENT  Most cases of vertigo resolve on their own, without treatment. Depending on the cause, your caregiver may prescribe certain medicines. If your vertigo is related to body position issues, your caregiver may recommend movements or procedures to correct the problem. In rare cases, if your vertigo is caused by certain inner ear problems, you may need surgery. HOME CARE INSTRUCTIONS   Follow your caregiver's instructions.  Avoid driving.  Avoid operating heavy machinery.  Avoid performing any tasks that would be dangerous to you or others during a vertigo episode.  Tell your caregiver if you notice that certain medicines seem to be causing your vertigo. Some of the medicines used to treat vertigo episodes can actually make them worse in some people. SEEK  IMMEDIATE MEDICAL CARE IF:   Your medicines do not relieve your vertigo or are making it worse.  You develop problems with talking, walking, weakness, or using your arms, hands, or legs.  You develop severe headaches.  Your nausea or vomiting continues or gets worse.  You develop visual changes.  A family member notices behavioral changes.  Your condition gets worse. MAKE SURE YOU:  Understand these instructions.  Will watch your condition.  Will get help right away if you are not doing well or get worse. Document Released: 08/16/2005 Document Revised: 01/29/2012 Document Reviewed: 05/25/2011 Exit[employer] Patient Information 2013 [employer].  

## 2013-04-07 NOTE — Progress Notes (Signed)
  Subjective:     [name] [name] is a 42 y.o. female who presents for evaluation of dizziness. The symptoms started 4 days ago and are unchanged. The attacks occur 1-2 x a night but she is light headed all day and last 20 seconds. Positions that worsen symptoms: any motion and lying down. Previous workup/treatments: none. Associated ear symptoms: none. Associated CNS symptoms: headaches and visual floaters. Recent infections: none. Head trauma: denied. Drug ingestion: none. Noise exposure: no occupational exposure. Family history: non-contributory.  The following portions of the patient's history were reviewed and updated as appropriate: allergies, current medications, past family history, past medical history, past social history, past surgical history and problem list.  Review of Systems Pertinent items are noted in HPI.    Objective:    BP 128/74  Pulse 96  Temp(Src) 98.4 F (36.9 C) (Oral)  Wt 216 lb (97.977 kg)  BMI 34.88 kg/m2  SpO2 97%  LMP 04/02/2013 General appearance: alert, cooperative, appears stated age and no distress Head: Normocephalic, without obvious abnormality, atraumatic Eyes: negative findings: lids and lashes normal, conjunctivae and sclerae normal and pupils equal, round, reactive to light and accomodation Ears: normal TM's and external ear canals both ears Nose: Nares normal. Septum midline. Mucosa normal. No drainage or sinus tenderness. Throat: lips, mucosa, and tongue normal; teeth and gums normal Neck: no adenopathy, supple, symmetrical, trachea midline and thyroid not enlarged, symmetric, no tenderness/mass/nodules Lungs: clear to auscultation bilaterally Heart: S1, S2 normal, tachycardic Neurologic: Alert and oriented X 3, normal strength and tone. Normal symmetric reflexes. Normal coordination and gait                      Very dizzy with change in position     Assessment:    Vertigo    Plan:    Meclizine per medication orders. Antiemetic per  medication orders. MRI head. The nature of vertigo syndromes were discussed along with their usual course and treatment. Educational materials given and questions answered. Follow up in 1 week.

## 2013-04-07 NOTE — Addendum Note (Signed)
Addended by: Candie Echevaria L on: 04/07/2013 06:20 PM   Modules accepted: Orders

## 2013-04-08 ENCOUNTER — Ambulatory Visit (HOSPITAL_COMMUNITY): Payer: 59

## 2013-04-08 ENCOUNTER — Ambulatory Visit (HOSPITAL_COMMUNITY)
Admission: RE | Admit: 2013-04-08 | Discharge: 2013-04-08 | Disposition: A | Discharge status: Home / Self Care | Payer: 59 | Source: Ambulatory Visit | Attending: Family Medicine | Admitting: Family Medicine

## 2013-04-08 DIAGNOSIS — J3489 Other specified disorders of nose and nasal sinuses: Secondary | ICD-10-CM | POA: Insufficient documentation

## 2013-04-08 DIAGNOSIS — R42 Dizziness and giddiness: Secondary | ICD-10-CM | POA: Insufficient documentation

## 2013-04-08 DIAGNOSIS — R269 Unspecified abnormalities of gait and mobility: Secondary | ICD-10-CM | POA: Insufficient documentation

## 2013-04-08 DIAGNOSIS — R519 Headache, unspecified: Secondary | ICD-10-CM

## 2013-04-08 DIAGNOSIS — R51 Headache: Secondary | ICD-10-CM | POA: Insufficient documentation

## 2013-04-08 DIAGNOSIS — J342 Deviated nasal septum: Secondary | ICD-10-CM | POA: Insufficient documentation

## 2013-04-15 ENCOUNTER — Ambulatory Visit (INDEPENDENT_AMBULATORY_CARE_PROVIDER_SITE_OTHER): Payer: 59 | Admitting: *Deleted

## 2013-04-15 DIAGNOSIS — E538 Deficiency of other specified B group vitamins: Secondary | ICD-10-CM

## 2013-04-15 MED ORDER — CYANOCOBALAMIN 1000 MCG/ML IJ SOLN
1000.0000 ug | Freq: Once | INTRAMUSCULAR | Status: AC
  Administered 2013-04-15: 1000 ug via INTRAMUSCULAR

## 2013-04-22 ENCOUNTER — Ambulatory Visit (INDEPENDENT_AMBULATORY_CARE_PROVIDER_SITE_OTHER): Payer: 59 | Admitting: *Deleted

## 2013-04-22 DIAGNOSIS — E538 Deficiency of other specified B group vitamins: Secondary | ICD-10-CM

## 2013-04-22 MED ORDER — CYANOCOBALAMIN 1000 MCG/ML IJ SOLN
1000.0000 ug | Freq: Once | INTRAMUSCULAR | Status: AC
  Administered 2013-04-22: 1000 ug via INTRAMUSCULAR

## 2013-04-29 ENCOUNTER — Ambulatory Visit (INDEPENDENT_AMBULATORY_CARE_PROVIDER_SITE_OTHER): Payer: 59 | Admitting: General Practice

## 2013-04-29 DIAGNOSIS — E538 Deficiency of other specified B group vitamins: Secondary | ICD-10-CM

## 2013-04-29 DIAGNOSIS — R5381 Other malaise: Secondary | ICD-10-CM

## 2013-04-29 DIAGNOSIS — R5383 Other fatigue: Secondary | ICD-10-CM

## 2013-04-29 MED ORDER — CYANOCOBALAMIN 1000 MCG/ML IJ SOLN
1000.0000 ug | Freq: Once | INTRAMUSCULAR | Status: AC
  Administered 2013-04-29: 1000 ug via INTRAMUSCULAR

## 2013-05-06 ENCOUNTER — Other Ambulatory Visit (INDEPENDENT_AMBULATORY_CARE_PROVIDER_SITE_OTHER): Payer: 59

## 2013-05-06 DIAGNOSIS — R42 Dizziness and giddiness: Secondary | ICD-10-CM

## 2013-05-07 LAB — VITAMIN B12: Vitamin B-12: 647 pg/mL (ref 211–911)

## 2013-05-20 ENCOUNTER — Encounter: Payer: Self-pay | Admitting: Family Medicine

## 2013-05-20 ENCOUNTER — Ambulatory Visit (INDEPENDENT_AMBULATORY_CARE_PROVIDER_SITE_OTHER): Payer: 59 | Admitting: Family Medicine

## 2013-05-20 VITALS — BP 148/90 | HR 103 | Temp 98.4°F | Wt 209.0 lb

## 2013-05-20 DIAGNOSIS — M25579 Pain in unspecified ankle and joints of unspecified foot: Secondary | ICD-10-CM

## 2013-05-20 DIAGNOSIS — M25572 Pain in left ankle and joints of left foot: Secondary | ICD-10-CM

## 2013-05-20 NOTE — Progress Notes (Signed)
  Subjective:    [name] [name] is a 42 y.o. female who presents with left ankle, foot pain. Onset of the symptoms was yesterday. Inciting event: inverted while playing with her son. Current symptoms include: ability to bear weight, but with some pain, bruising, pain with inversion of the foot and swelling. Aggravating factors: standing and walking . Symptoms have gradually worsened. Patient has had no prior ankle problems. Evaluation to date: none. Treatment to date: none. The following portions of the patient's history were reviewed and updated as appropriate: allergies, current medications, past family history, past medical history, past social history, past surgical history and problem list.    Objective:    BP 148/90  Pulse 103  Temp(Src) 98.4 F (36.9 C) (Oral)  Wt 209 lb (94.802 kg)  BMI 33.75 kg/m2  SpO2 98%  LMP 05/08/2013 Right ankle:   normal  Left ankle:   ecchymosis noted top of L foot and medial foot ..+ effusion noted laterally .+ tenderness over the lateral malleolus   Imaging: X-ray of the left ankle(s) and foot: not available    Assessment:    Ankle sprain    Plan:    Natural history and expected course discussed. Questions answered. Rest, ice, compression, elevation (RICE) therapy. Crutches and instructions provided. f/u prn

## 2013-05-22 ENCOUNTER — Ambulatory Visit (HOSPITAL_BASED_OUTPATIENT_CLINIC_OR_DEPARTMENT_OTHER)
Admission: RE | Admit: 2013-05-22 | Discharge: 2013-05-22 | Disposition: A | Discharge status: Home / Self Care | Payer: 59 | Source: Ambulatory Visit | Attending: Family Medicine | Admitting: Family Medicine

## 2013-05-22 DIAGNOSIS — M25579 Pain in unspecified ankle and joints of unspecified foot: Secondary | ICD-10-CM | POA: Insufficient documentation

## 2013-05-22 DIAGNOSIS — M79609 Pain in unspecified limb: Secondary | ICD-10-CM | POA: Insufficient documentation

## 2013-05-22 DIAGNOSIS — X500XXA Overexertion from strenuous movement or load, initial encounter: Secondary | ICD-10-CM | POA: Insufficient documentation

## 2013-05-22 DIAGNOSIS — S79919A Unspecified injury of unspecified hip, initial encounter: Secondary | ICD-10-CM | POA: Insufficient documentation

## 2013-05-22 DIAGNOSIS — M25572 Pain in left ankle and joints of left foot: Secondary | ICD-10-CM

## 2013-06-17 ENCOUNTER — Encounter: Payer: Self-pay | Admitting: Internal Medicine

## 2013-06-17 ENCOUNTER — Ambulatory Visit (INDEPENDENT_AMBULATORY_CARE_PROVIDER_SITE_OTHER): Payer: 59 | Admitting: Internal Medicine

## 2013-06-17 VITALS — BP 140/88 | HR 108 | Temp 98.3°F

## 2013-06-17 DIAGNOSIS — R21 Rash and other nonspecific skin eruption: Secondary | ICD-10-CM

## 2013-06-17 MED ORDER — KETOCONAZOLE 2 % EX CREA: TOPICAL_CREAM | Freq: Two times a day (BID) | CUTANEOUS | Status: DC

## 2013-06-17 MED ORDER — HYDROCORTISONE 2.5 % EX CREA: TOPICAL_CREAM | Freq: Two times a day (BID) | CUTANEOUS | Status: DC | PRN

## 2013-06-17 NOTE — Progress Notes (Signed)
  Subjective:    Patient ID: [name] [name], female    DOB: [birth year], 42 y.o.   MRN: [id number]  HPI Acute visit One-week history of a itchy rash. No fever chills, no other symptoms. She has been exposed to dogs lately.  Past Medical History  Diagnosis Date  . GERD (gastroesophageal reflux disease)   . Depression   . Hyperlipidemia     no meds - diet  controlled  . PCOS (polycystic ovarian syndrome)   . Migraines   . SVD (spontaneous vaginal delivery)     x 2  . Tachycardia     occasional - tx with toprol xl prn -   . Anxiety   . Osteoarthritis     no med  . Dysrhythmia     sinus tachycardia- on toprol     Review of Systems     Objective:   Physical Exam  Constitutional: She appears well-developed and well-nourished. No distress.  Skin: She is not diaphoretic.         Assessment & Plan:   Rash, likely fungal. See instructions

## 2013-06-17 NOTE — Patient Instructions (Addendum)
Ketoconazole twice a day for 7-10 days Hydrocortisone as needed only Call if no better in few days

## 2013-06-30 ENCOUNTER — Emergency Department (HOSPITAL_COMMUNITY)
Admission: EM | Admit: 2013-06-30 | Discharge: 2013-07-01 | Disposition: A | Discharge status: Other Acute Inpatient Hospital | Payer: 59 | Attending: Emergency Medicine | Admitting: Emergency Medicine

## 2013-06-30 ENCOUNTER — Encounter (HOSPITAL_COMMUNITY): Payer: Self-pay

## 2013-06-30 DIAGNOSIS — Z8679 Personal history of other diseases of the circulatory system: Secondary | ICD-10-CM | POA: Insufficient documentation

## 2013-06-30 DIAGNOSIS — T43502A Poisoning by unspecified antipsychotics and neuroleptics, intentional self-harm, initial encounter: Secondary | ICD-10-CM | POA: Insufficient documentation

## 2013-06-30 DIAGNOSIS — Z3202 Encounter for pregnancy test, result negative: Secondary | ICD-10-CM | POA: Insufficient documentation

## 2013-06-30 DIAGNOSIS — F3289 Other specified depressive episodes: Secondary | ICD-10-CM | POA: Insufficient documentation

## 2013-06-30 DIAGNOSIS — F329 Major depressive disorder, single episode, unspecified: Secondary | ICD-10-CM

## 2013-06-30 DIAGNOSIS — T50902A Poisoning by unspecified drugs, medicaments and biological substances, intentional self-harm, initial encounter: Secondary | ICD-10-CM

## 2013-06-30 DIAGNOSIS — Z8639 Personal history of other endocrine, nutritional and metabolic disease: Secondary | ICD-10-CM | POA: Insufficient documentation

## 2013-06-30 DIAGNOSIS — Z862 Personal history of diseases of the blood and blood-forming organs and certain disorders involving the immune mechanism: Secondary | ICD-10-CM | POA: Insufficient documentation

## 2013-06-30 DIAGNOSIS — T1491XA Suicide attempt, initial encounter: Secondary | ICD-10-CM

## 2013-06-30 DIAGNOSIS — F17200 Nicotine dependence, unspecified, uncomplicated: Secondary | ICD-10-CM | POA: Insufficient documentation

## 2013-06-30 DIAGNOSIS — F411 Generalized anxiety disorder: Secondary | ICD-10-CM | POA: Insufficient documentation

## 2013-06-30 DIAGNOSIS — T424X4A Poisoning by benzodiazepines, undetermined, initial encounter: Secondary | ICD-10-CM | POA: Insufficient documentation

## 2013-06-30 DIAGNOSIS — Z8739 Personal history of other diseases of the musculoskeletal system and connective tissue: Secondary | ICD-10-CM | POA: Insufficient documentation

## 2013-06-30 DIAGNOSIS — K219 Gastro-esophageal reflux disease without esophagitis: Secondary | ICD-10-CM | POA: Insufficient documentation

## 2013-06-30 DIAGNOSIS — Z87442 Personal history of urinary calculi: Secondary | ICD-10-CM | POA: Insufficient documentation

## 2013-06-30 DIAGNOSIS — Z8742 Personal history of other diseases of the female genital tract: Secondary | ICD-10-CM | POA: Insufficient documentation

## 2013-06-30 LAB — COMPREHENSIVE METABOLIC PANEL
ALT: 16 U/L (ref 0–35)
AST: 13 U/L (ref 0–37)
Albumin: 3.7 g/dL (ref 3.5–5.2)
Calcium: 9.5 mg/dL (ref 8.4–10.5)
Potassium: 3.8 mEq/L (ref 3.5–5.1)
Sodium: 137 mEq/L (ref 135–145)
Total Protein: 7.3 g/dL (ref 6.0–8.3)

## 2013-06-30 LAB — RAPID URINE DRUG SCREEN, HOSP PERFORMED
Amphetamines: NOT DETECTED
Benzodiazepines: POSITIVE — AB
Cocaine: POSITIVE — AB
Opiates: NOT DETECTED

## 2013-06-30 LAB — SALICYLATE LEVEL: Salicylate Lvl: <2 mg/dL — ABNORMAL LOW (ref 2.8–20.0)

## 2013-06-30 LAB — CBC
MCH: 28.1 pg (ref 26.0–34.0)
MCHC: 32.7 g/dL (ref 30.0–36.0)
Platelets: 349 10*3/uL (ref 150–400)

## 2013-06-30 LAB — POCT PREGNANCY, URINE: Preg Test, Ur: NEGATIVE

## 2013-06-30 MED ORDER — ACETAMINOPHEN 325 MG PO TABS: 650.0000 mg | ORAL_TABLET | ORAL | Status: DC | PRN

## 2013-06-30 MED ORDER — ALUM & MAG HYDROXIDE-SIMETH 200-200-20 MG/5ML PO SUSP: 30.0000 mL | ORAL | Status: DC | PRN

## 2013-06-30 MED ORDER — IBUPROFEN 200 MG PO TABS: 600.0000 mg | ORAL_TABLET | Freq: Three times a day (TID) | ORAL | Status: DC | PRN

## 2013-06-30 MED ORDER — ONDANSETRON HCL 4 MG PO TABS: 4.0000 mg | ORAL_TABLET | Freq: Three times a day (TID) | ORAL | Status: DC | PRN

## 2013-06-30 MED ORDER — NICOTINE 21 MG/24HR TD PT24: 21.0000 mg | MEDICATED_PATCH | Freq: Every day | TRANSDERMAL | Status: DC
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
## 2013-06-30 NOTE — ED Notes (Signed)
Pt states having increased depression, states took unknown amount of 1mg  xanax's state turned the bottle up and drank it milk x1hr, denies vomiting

## 2013-07-01 ENCOUNTER — Inpatient Hospital Stay (HOSPITAL_COMMUNITY)
Admission: EM | Admit: 2013-07-01 | Discharge: 2013-07-04 | DRG: 885 | Disposition: A | Discharge status: Home / Self Care | Payer: 59 | Source: Intra-hospital | Attending: Psychiatry | Admitting: Psychiatry

## 2013-07-01 ENCOUNTER — Encounter (HOSPITAL_COMMUNITY): Payer: Self-pay | Admitting: Emergency Medicine

## 2013-07-01 ENCOUNTER — Encounter (HOSPITAL_COMMUNITY): Payer: Self-pay | Admitting: *Deleted

## 2013-07-01 DIAGNOSIS — Z79899 Other long term (current) drug therapy: Secondary | ICD-10-CM

## 2013-07-01 DIAGNOSIS — Z87410 Personal history of cervical dysplasia: Secondary | ICD-10-CM

## 2013-07-01 DIAGNOSIS — Z87448 Personal history of other diseases of urinary system: Secondary | ICD-10-CM

## 2013-07-01 DIAGNOSIS — K219 Gastro-esophageal reflux disease without esophagitis: Secondary | ICD-10-CM

## 2013-07-01 DIAGNOSIS — T7491XD Unspecified adult maltreatment, confirmed, subsequent encounter: Secondary | ICD-10-CM

## 2013-07-01 DIAGNOSIS — I498 Other specified cardiac arrhythmias: Secondary | ICD-10-CM

## 2013-07-01 DIAGNOSIS — F3289 Other specified depressive episodes: Secondary | ICD-10-CM

## 2013-07-01 DIAGNOSIS — N390 Urinary tract infection, site not specified: Secondary | ICD-10-CM

## 2013-07-01 DIAGNOSIS — F329 Major depressive disorder, single episode, unspecified: Secondary | ICD-10-CM

## 2013-07-01 DIAGNOSIS — E162 Hypoglycemia, unspecified: Secondary | ICD-10-CM

## 2013-07-01 DIAGNOSIS — Z9889 Other specified postprocedural states: Secondary | ICD-10-CM

## 2013-07-01 DIAGNOSIS — L659 Nonscarring hair loss, unspecified: Secondary | ICD-10-CM

## 2013-07-01 DIAGNOSIS — A630 Anogenital (venereal) warts: Secondary | ICD-10-CM

## 2013-07-01 DIAGNOSIS — F1410 Cocaine abuse, uncomplicated: Secondary | ICD-10-CM

## 2013-07-01 DIAGNOSIS — N200 Calculus of kidney: Secondary | ICD-10-CM

## 2013-07-01 DIAGNOSIS — IMO0001 Reserved for inherently not codable concepts without codable children: Secondary | ICD-10-CM

## 2013-07-01 DIAGNOSIS — F411 Generalized anxiety disorder: Secondary | ICD-10-CM | POA: Diagnosis present

## 2013-07-01 DIAGNOSIS — R259 Unspecified abnormal involuntary movements: Secondary | ICD-10-CM

## 2013-07-01 DIAGNOSIS — F4310 Post-traumatic stress disorder, unspecified: Secondary | ICD-10-CM | POA: Diagnosis present

## 2013-07-01 DIAGNOSIS — R079 Chest pain, unspecified: Secondary | ICD-10-CM

## 2013-07-01 DIAGNOSIS — F4323 Adjustment disorder with mixed anxiety and depressed mood: Secondary | ICD-10-CM

## 2013-07-01 DIAGNOSIS — E282 Polycystic ovarian syndrome: Secondary | ICD-10-CM

## 2013-07-01 DIAGNOSIS — Q078 Other specified congenital malformations of nervous system: Secondary | ICD-10-CM

## 2013-07-01 DIAGNOSIS — G4730 Sleep apnea, unspecified: Secondary | ICD-10-CM

## 2013-07-01 DIAGNOSIS — R000 Tachycardia, unspecified: Secondary | ICD-10-CM

## 2013-07-01 DIAGNOSIS — R002 Palpitations: Secondary | ICD-10-CM

## 2013-07-01 DIAGNOSIS — H6690 Otitis media, unspecified, unspecified ear: Secondary | ICD-10-CM

## 2013-07-01 DIAGNOSIS — E785 Hyperlipidemia, unspecified: Secondary | ICD-10-CM

## 2013-07-01 DIAGNOSIS — I10 Essential (primary) hypertension: Secondary | ICD-10-CM

## 2013-07-01 DIAGNOSIS — R1011 Right upper quadrant pain: Secondary | ICD-10-CM

## 2013-07-01 DIAGNOSIS — F332 Major depressive disorder, recurrent severe without psychotic features: Principal | ICD-10-CM | POA: Diagnosis present

## 2013-07-01 DIAGNOSIS — R5383 Other fatigue: Secondary | ICD-10-CM

## 2013-07-01 DIAGNOSIS — F1994 Other psychoactive substance use, unspecified with psychoactive substance-induced mood disorder: Secondary | ICD-10-CM

## 2013-07-01 DIAGNOSIS — R748 Abnormal levels of other serum enzymes: Secondary | ICD-10-CM

## 2013-07-01 DIAGNOSIS — R42 Dizziness and giddiness: Secondary | ICD-10-CM

## 2013-07-01 DIAGNOSIS — F1010 Alcohol abuse, uncomplicated: Secondary | ICD-10-CM

## 2013-07-01 DIAGNOSIS — J309 Allergic rhinitis, unspecified: Secondary | ICD-10-CM

## 2013-07-01 DIAGNOSIS — R599 Enlarged lymph nodes, unspecified: Secondary | ICD-10-CM

## 2013-07-01 DIAGNOSIS — R591 Generalized enlarged lymph nodes: Secondary | ICD-10-CM

## 2013-07-01 DIAGNOSIS — M549 Dorsalgia, unspecified: Secondary | ICD-10-CM

## 2013-07-01 DIAGNOSIS — G43909 Migraine, unspecified, not intractable, without status migrainosus: Secondary | ICD-10-CM

## 2013-07-01 DIAGNOSIS — T43502A Poisoning by unspecified antipsychotics and neuroleptics, intentional self-harm, initial encounter: Secondary | ICD-10-CM

## 2013-07-01 DIAGNOSIS — M79609 Pain in unspecified limb: Secondary | ICD-10-CM

## 2013-07-01 DIAGNOSIS — Z5189 Encounter for other specified aftercare: Secondary | ICD-10-CM

## 2013-07-01 DIAGNOSIS — E039 Hypothyroidism, unspecified: Secondary | ICD-10-CM

## 2013-07-01 DIAGNOSIS — T438X2A Poisoning by other psychotropic drugs, intentional self-harm, initial encounter: Secondary | ICD-10-CM | POA: Diagnosis present

## 2013-07-01 DIAGNOSIS — F14120 Cocaine abuse with intoxication, uncomplicated: Secondary | ICD-10-CM

## 2013-07-01 MED ORDER — METOPROLOL SUCCINATE ER 50 MG PO TB24
50.0000 mg | ORAL_TABLET | Freq: Two times a day (BID) | ORAL | Status: DC
  Administered 2013-07-01 – 2013-07-04 (×6): 50 mg via ORAL
  Filled 2013-07-01 (×10): qty 1

## 2013-07-01 MED ORDER — ALUM & MAG HYDROXIDE-SIMETH 200-200-20 MG/5ML PO SUSP: 30.0000 mL | ORAL | Status: DC | PRN

## 2013-07-01 MED ORDER — ARIPIPRAZOLE 5 MG PO TABS
5.0000 mg | ORAL_TABLET | Freq: Two times a day (BID) | ORAL | Status: DC
  Administered 2013-07-01 – 2013-07-03 (×6): 5 mg via ORAL
  Filled 2013-07-01: qty 3
  Filled 2013-07-01 (×7): qty 1
  Filled 2013-07-01: qty 3
  Filled 2013-07-01 (×2): qty 1

## 2013-07-01 MED ORDER — TRAZODONE HCL 50 MG PO TABS
50.0000 mg | ORAL_TABLET | Freq: Every evening | ORAL | Status: DC | PRN
  Administered 2013-07-03 (×2): 50 mg via ORAL
  Filled 2013-07-01: qty 1
  Filled 2013-07-01: qty 6
  Filled 2013-07-01 (×3): qty 1
  Filled 2013-07-01: qty 6
  Filled 2013-07-01 (×4): qty 1

## 2013-07-01 MED ORDER — ACETAMINOPHEN 325 MG PO TABS: 650.0000 mg | ORAL_TABLET | Freq: Four times a day (QID) | ORAL | Status: DC | PRN

## 2013-07-01 MED ORDER — BUPROPION HCL ER (SR) 150 MG PO TB12
150.0000 mg | ORAL_TABLET | Freq: Two times a day (BID) | ORAL | Status: DC
  Administered 2013-07-01 – 2013-07-04 (×6): 150 mg via ORAL
  Filled 2013-07-01: qty 1
  Filled 2013-07-01: qty 6
  Filled 2013-07-01 (×2): qty 1
  Filled 2013-07-01: qty 6
  Filled 2013-07-01 (×5): qty 1

## 2013-07-01 MED ORDER — MAGNESIUM HYDROXIDE 400 MG/5ML PO SUSP: 30.0000 mL | Freq: Every day | ORAL | Status: DC | PRN
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
## 2013-07-01 NOTE — ED Provider Notes (Signed)
Pt accepted to [facility].  Rolan Bucco, MD 07/01/13 [id number]
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
## 2013-07-01 NOTE — Tx Team (Signed)
Initial Interdisciplinary Treatment Plan  PATIENT STRENGTHS: (choose at least two) Ability for insight Average or above average intelligence Communication skills General fund of knowledge Motivation for treatment/growth Physical Health Work skills  PATIENT STRESSORS: Financial difficulties Marital or family conflict Substance abuse   PROBLEM LIST: Problem List/Patient Goals Date to be addressed Date deferred Reason deferred Estimated date of resolution  Depression 07/01/13     Suicide Risk 07/01/13     Substance Abuse 07/01/13                                          DISCHARGE CRITERIA:  Adequate post-discharge living arrangements Improved stabilization in mood, thinking, and/or behavior Need for constant or close observation no longer present Verbal commitment to aftercare and medication compliance  PRELIMINARY DISCHARGE PLAN: Outpatient therapy Placement in alternative living arrangements  PATIENT/FAMIILY INVOLVEMENT: This treatment plan has been presented to and reviewed with the patient, [name] [name], and/or family member.  The patient and family have been given the opportunity to ask questions and make suggestions.  [name] 07/01/2013, 5:44 AM

## 2013-07-01 NOTE — Progress Notes (Signed)
Recreation Therapy Notes  Date: 08.12.2014 Time: 2:45pm Location: [address] Dayroom  Group Topic: Animal Assisted Activities  Goal Area(s) Addresses:  Patient will interact appropriately with dog team.    Behavioral Response: Did not attend. Patient consent form indicated patient refused all animal assisted therapy services during admission.   Marykay Lex Yanette Tripoli, LRT/CTRS  Vergia Chea L 07/01/2013 5:00 PM

## 2013-07-01 NOTE — Progress Notes (Signed)
The focus of this group is to educate the patient on the purpose and policies of crisis stabilization and provide a format to answer questions about their admission.  The group details unit policies and expectations of patients while admitted. Patient did not attend. 
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
## 2013-07-01 NOTE — [facility] Group Notes (Signed)
[facility] LCSW Group Therapy  07/01/2013  1:15 PM   Type of Therapy:  Group Therapy  Participation Level:  Active  Participation Quality:  Appropriate and Attentive  Affect:  Appropriate, Flat and Depressed  Cognitive:  Alert and Appropriate  Insight:  Developing/Improving and Engaged  Engagement in Therapy:  Developing/Improving and Engaged  Modes of Intervention:  Clarification, Confrontation, Discussion, Education, Exploration, Limit-setting, Orientation, Problem-solving, Rapport Building, Dance movement psychotherapist, Socialization and Support  Summary of Progress/Problems: The topic for group therapy was feelings about diagnosis.  Pt actively participated in group discussion on their past and current diagnosis and how they feel towards this.  Pt also identified how society and family members judge them, based on their diagnosis as well as stereotypes and stigmas.   Pt shared that she's been diagnosed with depression and anxiety for over 10 years but continues to struggle with why she has to deal with this, hates the diagnoses and is sad about it.  Pt states that she accepts that she needs medication and takes it as prescribed but continues to struggle with understanding it.  Pt actively participated and was engaged in group discussion.    Reyes Ivan, LCSWA 07/01/2013 2:27 PM

## 2013-07-01 NOTE — Progress Notes (Signed)
D:  Patient up and visible in the milieu.  Attending and participating in groups.  She expressed concern about her medications being ordered differently than they were prior to admission.  She also expressed concern about Effexor being stopped stating she has been on it for over a year and is afraid of having withdrawals without it.  She rates depression and hopelessness as high and endorses thoughts of suicide.  She does agree to seek out staff if feeling unsafe.  A:  Medications given as prescribed.  Explained all medications to patient and informed her of when they would be scheduled.  Encouraged patient to discuss medication questions further with the doctor tomorrow morning in treatment team.  Offered support and encouragement.  R:  Pleasant and cooperative with staff.  Interacting well with peers.  Agrees to discuss medication questions with the provider in the morning.
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
## 2013-07-01 NOTE — ED Notes (Signed)
No acute distress noted.

## 2013-07-02 DIAGNOSIS — T43502A Poisoning by unspecified antipsychotics and neuroleptics, intentional self-harm, initial encounter: Secondary | ICD-10-CM

## 2013-07-02 DIAGNOSIS — T4394XA Poisoning by unspecified psychotropic drug, undetermined, initial encounter: Secondary | ICD-10-CM

## 2013-07-02 DIAGNOSIS — T438X2A Poisoning by other psychotropic drugs, intentional self-harm, initial encounter: Secondary | ICD-10-CM

## 2013-07-02 DIAGNOSIS — T43591A Poisoning by other antipsychotics and neuroleptics, accidental (unintentional), initial encounter: Secondary | ICD-10-CM

## 2013-07-02 MED ORDER — LINACLOTIDE 290 MCG PO CAPS: 1.0000 | ORAL_CAPSULE | Freq: Every day | ORAL | Status: DC

## 2013-07-02 MED ORDER — PANTOPRAZOLE SODIUM 40 MG PO TBEC
40.0000 mg | DELAYED_RELEASE_TABLET | Freq: Every day | ORAL | Status: DC
  Administered 2013-07-02 – 2013-07-04 (×3): 40 mg via ORAL
  Filled 2013-07-02 (×5): qty 1

## 2013-07-02 MED ORDER — LINACLOTIDE 145 MCG PO CAPS
290.0000 ug | ORAL_CAPSULE | Freq: Every day | ORAL | Status: DC
  Administered 2013-07-02 – 2013-07-04 (×3): 290 ug via ORAL
  Filled 2013-07-02 (×5): qty 2

## 2013-07-02 NOTE — Progress Notes (Signed)
Adult Psychoeducational Group Note  Date:  07/02/2013 Time:  1000AM Group Topic/Focus:  Therapeutic Activity  Participation Level:  Did Not Attend   Additional Comments: Pt. Didn't attend group.   Bing Plume D 07/02/2013, 11:17 AM
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
## 2013-07-02 NOTE — Tx Team (Signed)
Interdisciplinary Treatment Plan Update (Adult)  Date: 07/02/2013  Time Reviewed:  9:45 AM  Progress in Treatment: Attending groups: Yes Participating in groups:  Yes Taking medication as prescribed:  Yes Tolerating medication:  Yes Family/Significant othe contact made: CSW assessing  Patient understands diagnosis:  Yes Discussing patient identified problems/goals with staff:  Yes Medical problems stabilized or resolved:  Yes Denies suicidal/homicidal ideation: Yes Issues/concerns per patient self-inventory:  Yes Other:  New problem(s) identified: N/A  Discharge Plan or Barriers: Pt has follow up scheduled at [facility] for medication management and therapy.    Reason for Continuation of Hospitalization: Anxiety Depression Medication Stabilization  Comments: Effexor will be discontinued, started on Abilify and Wellbutrin  Estimated length of stay: 3-5 days  For review of initial/current patient goals, please see plan of care.  Attendees: Patient:     Family:     Physician:  Dr. Javier Glazier 07/02/2013 9:56 AM   Nursing:   Harold Barban, RN 07/02/2013 9:56 AM   Clinical Social Worker:  Reyes Ivan, LCSWA 07/02/2013 9:56 AM   Other: Verne Spurr, PA 07/02/2013 9:56 AM   Other:  Frankey Shown, MA care coordination 07/02/2013 9:56 AM   Other:  Juline Patch, LCSW 07/02/2013 9:56 AM   Other:  Chinita Greenland, RN 07/02/2013 9:56 AM  Other:    Other:    Other:    Other:    Other:    Other:     Scribe for Treatment Team:   Carmina Miller, 07/02/2013 9:56 AM

## 2013-07-02 NOTE — Progress Notes (Signed)
Recreation Therapy Notes  Date: 08.13.2014 Time: 3:00pm Location: 500 Hall Dayroom  Group Topic: Communication, Team Building, Problem Solving  Goal Area(s) Addresses:  Patient will be able to recognize use of communication, team building and problem solving during course of group activity. Patient will verbalize need for communication, team building and problem solving to make group activity successful.  Patient will verbalize benefit of communication, team building and problem solving to post d/c goals.   Behavioral Response: Did not attend. As group session was starting PA asked patient to leave group session. Patient did not return to group session.   Marykay Lex Jacorian Golaszewski, LRT/CTRS  Kyrin Garn L 07/02/2013 5:00 PM
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
## 2013-07-02 NOTE — Progress Notes (Signed)
[facility] MD Progress Note  07/02/2013 4:23 PM [name] [name]  MRN:  [id number] Subjective:  Patient expresses frustration that she is not discharged yet. She shows no insight and very poor judgement regarding her behaviors. She states that she was "seeking attention" from her boyfriend. She notes that she "spit out the pills when he wasn't looking."  Patient also informed of the positive UDS for cocaine, and stated that she does do that about 1 x a month, but denies it isa problem and that she doesn't do any other drugs. Diagnosis:  Suicide and self-inflicted poisoning by tranquilizers and other psychotropic agents   ADL's:  Intact  Sleep: Poor  Appetite:  Fair  Suicidal Ideation:  denies Homicidal Ideation:  denies AEB (as evidenced by):  Psychiatric Specialty Exam: Review of Systems  Constitutional: Negative.  Negative for fever, chills, weight loss, malaise/fatigue and diaphoresis.  HENT: Negative for congestion and sore throat.   Eyes: Negative for blurred vision, double vision and photophobia.  Respiratory: Negative for cough, shortness of breath and wheezing.   Cardiovascular: Negative for chest pain, palpitations and PND.  Gastrointestinal: Negative for heartburn, nausea, vomiting, abdominal pain, diarrhea and constipation.  Musculoskeletal: Negative for myalgias, joint pain and falls.  Neurological: Negative for dizziness, tingling, tremors, sensory change, speech change, focal weakness, seizures, loss of consciousness, weakness and headaches.  Endo/Heme/Allergies: Negative for polydipsia. Does not bruise/bleed easily.  Psychiatric/Behavioral: Negative for depression, suicidal ideas, hallucinations, memory loss and substance abuse. The patient is not nervous/anxious and does not have insomnia.     Blood pressure 115/77, pulse 76, temperature 98 F (36.7 C), temperature source Oral, resp. rate 20, height 5\' 7"  (1.702 m), weight 92.9 kg (204 lb 12.9 oz), last menstrual period  06/29/2013.Body mass index is 32.07 kg/(m^2).  General Appearance: Casual  Eye Contact::  Minimal  Speech:  Clear and Coherent  Volume:  Normal  Mood:  Anxious and Irritable  Affect:  Constricted  Thought Process:  Circumstantial  Orientation:  Full (Time, Place, and Person)  Thought Content:  WDL  Suicidal Thoughts:  No  Homicidal Thoughts:  No  Memory:  Immediate;   Fair  Judgement:  Impaired  Insight:  Lacking  Psychomotor Activity:  Normal  Concentration:  Poor  Recall:  Poor  Akathisia:  No  Handed:  Right  AIMS (if indicated):     Assets:  Desire for Improvement Housing Resilience Social Support  Sleep:  Number of Hours: 6.75   Current Medications: Current Facility-Administered Medications  Medication Dose Route Frequency Provider Last Rate Last Dose  . acetaminophen (TYLENOL) tablet 650 mg  650 mg Oral Q6H PRN Kerry Hough, PA-C      . alum & mag hydroxide-simeth (MAALOX/MYLANTA) 200-200-20 MG/5ML suspension 30 mL  30 mL Oral Q4H PRN Kerry Hough, PA-C      . ARIPiprazole (ABILIFY) tablet 5 mg  5 mg Oral BID Nehemiah Settle, MD   5 mg at 07/02/13 0801  . buPROPion [facility] SR) 12 hr tablet 150 mg  150 mg Oral BID Nehemiah Settle, MD   150 mg at 07/02/13 0802  . Linaclotide (LINZESS) capsule 290 mcg  290 mcg Oral Daily Nehemiah Settle, MD   290 mcg at 07/02/13 1203  . magnesium hydroxide (MILK OF MAGNESIA) suspension 30 mL  30 mL Oral Daily PRN Kerry Hough, PA-C      . metoprolol succinate (TOPROL-XL) 24 hr tablet 50 mg  50 mg Oral BID PC Janardhaha R  Brody Bonneau, MD   50 mg at 07/02/13 0801  . pantoprazole (PROTONIX) EC tablet 40 mg  40 mg Oral Daily Verne Spurr, PA-C   40 mg at 07/02/13 1203  . traZODone (DESYREL) tablet 50 mg  50 mg Oral QHS,MR X 1 Kerry Hough, PA-C        Lab Results:  Results for orders placed during the hospital encounter of 06/30/13 (from the past 48 hour(s))  GLUCOSE, CAPILLARY     Status: None    Collection Time    06/30/13  4:59 PM      Result Value Range   Glucose-Capillary 74  70 - 99 mg/dL  URINE RAPID DRUG SCREEN (HOSP PERFORMED)     Status: Abnormal   Collection Time    06/30/13  7:39 PM      Result Value Range   Opiates NONE DETECTED  NONE DETECTED   Cocaine POSITIVE (*) NONE DETECTED   Benzodiazepines POSITIVE (*) NONE DETECTED   Amphetamines NONE DETECTED  NONE DETECTED   Tetrahydrocannabinol NONE DETECTED  NONE DETECTED   Barbiturates NONE DETECTED  NONE DETECTED   Comment:            DRUG SCREEN FOR MEDICAL PURPOSES     ONLY.  IF CONFIRMATION IS NEEDED     FOR ANY PURPOSE, NOTIFY LAB     WITHIN 5 DAYS.                LOWEST DETECTABLE LIMITS     FOR URINE DRUG SCREEN     Drug Class       Cutoff (ng/mL)     Amphetamine      1000     Barbiturate      200     Benzodiazepine   200     Tricyclics       300     Opiates          300     Cocaine          300     THC              50  POCT PREGNANCY, URINE     Status: None   Collection Time    06/30/13  7:54 PM      Result Value Range   Preg Test, Ur NEGATIVE  NEGATIVE   Comment:            THE SENSITIVITY OF THIS     METHODOLOGY IS >24 mIU/mL    Physical Findings: AIMS: Facial and Oral Movements Muscles of Facial Expression: None, normal Lips and Perioral Area: None, normal Jaw: None, normal Tongue: None, normal,Extremity Movements Upper (arms, wrists, hands, fingers): None, normal Lower (legs, knees, ankles, toes): None, normal, Trunk Movements Neck, shoulders, hips: None, normal, Overall Severity Severity of abnormal movements (highest score from questions above): None, normal Incapacitation due to abnormal movements: None, normal Patient's awareness of abnormal movements (rate only patient's report): No Awareness, Dental Status Current problems with teeth and/or dentures?: No Does patient usually wear dentures?: No  CIWA:  CIWA-Ar Total: 0 COWS:  COWS Total Score: 0  Treatment Plan  Summary: Daily contact with patient to assess and evaluate symptoms and progress in treatment Medication management  Plan: 1. Continue crisis management and stabilization. 2. Medication management to reduce current symptoms to base line and improve patient's overall level of functioning 3. Treat health problems as indicated. 4. Develop treatment plan to decrease risk of relapse upon discharge  and the need for readmission. 5. Psycho-social education regarding relapse prevention and self care. 6. Health care follow up as needed for medical problems. 7. Continue home medications where appropriate. 8. ELOS: 1-3 days.  Medical Decision Making Problem Points:  Established problem, stable/improving (1) Data Points:  Review or order medicine tests (1)  I certify that inpatient services furnished can reasonably be expected to improve the patient's condition.   MASHBURN,NEIL 07/02/2013, 4:23 PM  Reviewed the information documented and agree with the treatment plan.  Weber Monnier,JANARDHAHA R. 07/04/2013 8:41 PM
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
## 2013-07-02 NOTE — [facility] Group Notes (Signed)
[facility] LCSW Group Therapy  07/02/2013  1:15 PM   Type of Therapy:  Group Therapy  Participation Level:  Active  Participation Quality:  Appropriate and Attentive  Affect:  Appropriate, Depressed and Flat  Cognitive:  Alert and Appropriate  Insight:  Developing/Improving and Engaged  Engagement in Therapy:  Developing/Improving and Engaged  Modes of Intervention:  Clarification, Confrontation, Discussion, Education, Exploration, Limit-setting, Orientation, Problem-solving, Rapport Building, Dance movement psychotherapist, Socialization and Support  Summary of Progress/Problems: The topic for group today was emotional regulation.  This group focused on both positive and negative emotion identification and allowed group members to process ways to identify feelings, regulate negative emotions, and find healthy ways to manage internal/external emotions. Group members were asked to reflect on a time when their reaction to an emotion led to a negative outcome and explored how alternative responses using emotion regulation would have benefited them. Group members were also asked to discuss a time when emotion regulation was utilized when a negative emotion was experienced.  Pt was able to process her overdose and how she was acting on emotions of hurt and anger, dealing with her boyfriend.  Pt states that she regrets her action of taking the bottle of xanax, as it resulted in this hospitalization.  Pt appeared to have insight on her actions and consequences that came from it.  Pt actively participated and was engaged in group discussion.    Reyes Ivan, LCSWA 07/02/2013 2:42 PM

## 2013-07-02 NOTE — Progress Notes (Signed)
D: Patient appropriate and cooperative with staff. Patient's affect/mood is flat and depressed. She reported on the self inventory sheet that her sleep is poor, appetite and ability to pay attention are both good and energy level is normal. Patient rated depression and feelings of hopelessness "5". She's attending groups/meals and compliant with medication regimen.  A: Support and encouragement provided to patient. Scheduled medications administered per MD orders. Maintain Q15 minute checks for safety.  R: Patient receptive. Denies SI/HI/AVH. Patient remains safe.

## 2013-07-02 NOTE — Progress Notes (Signed)
Adult Psychoeducational Group Note  Date:  07/02/2013 Time:  2:10 PM  Group Topic/Focus:  Crisis Planning:   The purpose of this group is to help patients create a crisis plan for use upon discharge or in the future, as needed.  Participation Level:  Minimal  Participation Quality:  Appropriate, Attentive and Supportive  Affect:  Appropriate and Flat  Cognitive:  Alert and Appropriate  Insight: Appropriate and Good  Engagement in Group:  Engaged  Modes of Intervention:  Discussion, Education and Support  Additional Comments:   Pt discussed the crisis that brought she to the hospital and she triggers. Pt was asked to think about if this was a crisis she was able to control, change or a reoccurring crisis and what she could have possibly done to change the situation if anything.   Dalia Heading 07/02/2013, 2:10 PM

## 2013-07-03 NOTE — Progress Notes (Signed)
Adult Psychoeducational Group Note  Date:  07/03/2013 Time:  3:37 PM  Group Topic/Focus:  Overcoming Stress:   The focus of this group is to define stress and help patients assess their triggers.  Participation Level:  Minimal  Participation Quality:  Attentive  Affect:  Blunted and Flat  Cognitive:  Alert and Appropriate  Insight: Improving  Engagement in Group:  Engaged and Improving  Modes of Intervention:  Discussion, Education and Support  Additional Comments:    Reynolds Bowl 07/03/2013, 3:37 PM

## 2013-07-03 NOTE — Progress Notes (Signed)
Adult Psychoeducational Group Note  Date:  07/03/2013 Time:  9:00 AM  Group Topic/Focus:  Self Esteem Action Plan:   The focus of this group is to help patients create a plan to continue to build self-esteem after discharge.  Participation Level:  Active  Participation Quality:  Appropriate and Attentive  Affect:  Appropriate  Cognitive:  Alert and Oriented  Insight: Appropriate  Engagement in Group:  Engaged  Modes of Intervention:  Discussion  Additional Comments:  Pt. verbalized that a goal should would like to achieve is to be married.  Harold Barban E 07/03/2013, 10:04 AM

## 2013-07-03 NOTE — [facility] Suicide Risk Assessment (Signed)
[facility] INPATIENT:  Family/Significant Other Suicide Prevention Education  Suicide Prevention Education:  Education Completed; Lewie Chamber - father [phone]),  (name of family member/significant other) has been identified by the patient as the family member/significant other with whom the patient will be residing, and identified as the person(s) who will aid the patient in the event of a mental health crisis (suicidal ideations/suicide attempt).  With written consent from the patient, the family member/significant other has been provided the following suicide prevention education, prior to the and/or following the discharge of the patient.  The suicide prevention education provided includes the following:  Suicide risk factors  Suicide prevention and interventions  National Suicide Hotline telephone number  [facility] assessment telephone number  [facility] Emergency Assistance 911  [facility] and/or Residential Mobile Crisis Unit telephone number  Request made of family/significant other to:  Remove weapons (e.g., guns, rifles, knives), all items previously/currently identified as safety concern.    Remove drugs/medications (over-the-counter, prescriptions, illicit drugs), all items previously/currently identified as a safety concern.  The family member/significant other verbalizes understanding of the suicide prevention education information provided.  The family member/significant other agrees to remove the items of safety concern listed above.  [name] [name] 07/03/2013, 2:52 PM

## 2013-07-03 NOTE — Progress Notes (Signed)
D: Patient pleasant and cooperative with staff and peers. Patient's affect/mood is appropriate to circumstance. She reported on the self inventory sheet that her sleep is fair, appetite and ability to pay attention are both good. Patient rated depression and feelings of hopelessness "2". She's participating in groups and having minimal interaction with peers in the milieu.   A: Support and encouragement provided to patient. Administered scheduled medications per ordering MD. Monitor Q15 minute checks for safety.  R: Patient receptive. Denies SI/HI/AVH. Patient remains safe on the unit.

## 2013-07-03 NOTE — Progress Notes (Signed)
Patient ID: [name] [name], female   DOB: Jan 10, 1971, 42 y.o.   MRN: [id number] Pt attended Tx activity, "Childhood activities as a child" She described fishing, sleepovers with friends, and riding bikes, as fun things she did as a child. She shared she would like to workout and have sleepovers with friends now. Pt affect brighter and interacting with other pts.

## 2013-07-03 NOTE — Progress Notes (Signed)
Recreation Therapy Notes  Date: 08.14.2014 Time: 2:45pm Location: [address] Dayroom   Group Topic: Animal Assisted Activities  Goal Area(s) Addresses:  Patient will interact appropriately with dog team.    Behavioral Response: Did not attend. Patient consent form indicated patient refused all AAA services during admission.    Marykay Lex Tirza [place], LRT/CTRS  Jearl Klinefelter 07/03/2013 7:32 PM

## 2013-07-03 NOTE — Progress Notes (Signed)
Adult Psychoeducational Group Note  Date:  07/03/2013 Time:  11:37 PM  Group Topic/Focus:  Wrap-Up Group:   The focus of this group is to help patients review their daily goal of treatment and discuss progress on daily workbooks.  Participation Level:  Active  Participation Quality:  Appropriate  Affect:  Appropriate  Cognitive:  Appropriate  Insight: Appropriate  Engagement in Group:  Engaged and Improving  Modes of Intervention:  Activity  Additional Comments:  Pt. Attended karaoke, was attentive and responded positively to the activity   Leyton Brownlee 07/03/2013, 11:37 PM

## 2013-07-03 NOTE — [facility] Group Notes (Signed)
[facility] LCSW Group Therapy  [facility] 1:15 - 2:30 PM  07/03/2013   Type of Therapy:  Group Therapy  Participation Level:  Minimal  Participation Quality:  Attentive  Affect:  Appropriate  Cognitive:  Appropriate  Insight:  Developing/Improving   Engagement in Therapy:  Developing/Improving   Modes of Intervention:  Discussion, Education, Exploration, Problem-Solving, Rapport Building, Support  Summary of Progress/Problems:  Patient listened attentively to speaker from [employer]. Patient did not make any comments on  Presentation.  Wynn Banker 07/03/2013

## 2013-07-03 NOTE — Progress Notes (Signed)
Patient ID: [name] [name], female   DOB: 1971-05-23, 42 y.o.   MRN: [id number] [facility] MD Progress Note  07/03/2013 4:08 PM [name] [name]  MRN:  [id number] Subjective: Patient notes that she is still frustrated because she is still here, but denies new symptoms. She is taking her medication as written and voices no side effects. She is attending groups and plans to follow up with Leilani Merl when she is D/C.   Diagnosis:  Suicide and self-inflicted poisoning by tranquilizers and other psychotropic agents   ADL's:  Intact  Sleep: Poor  Appetite:  Fair  Suicidal Ideation:  denies Homicidal Ideation:  denies AEB (as evidenced by):  Psychiatric Specialty Exam: Review of Systems  Constitutional: Negative.  Negative for fever, chills, weight loss, malaise/fatigue and diaphoresis.  HENT: Negative for congestion and sore throat.   Eyes: Negative for blurred vision, double vision and photophobia.  Respiratory: Negative for cough, shortness of breath and wheezing.   Cardiovascular: Negative for chest pain, palpitations and PND.  Gastrointestinal: Negative for heartburn, nausea, vomiting, abdominal pain, diarrhea and constipation.  Musculoskeletal: Negative for myalgias, joint pain and falls.  Neurological: Negative for dizziness, tingling, tremors, sensory change, speech change, focal weakness, seizures, loss of consciousness, weakness and headaches.  Endo/Heme/Allergies: Negative for polydipsia. Does not bruise/bleed easily.  Psychiatric/Behavioral: Negative for depression, suicidal ideas, hallucinations, memory loss and substance abuse. The patient is not nervous/anxious and does not have insomnia.     Blood pressure 112/79, pulse 68, temperature 97.7 F (36.5 C), temperature source Oral, resp. rate 19, height 5\' 7"  (1.702 m), weight 92.9 kg (204 lb 12.9 oz), last menstrual period 06/29/2013.Body mass index is 32.07 kg/(m^2).  General Appearance: Casual  Eye Contact::  Minimal  Speech:   Clear and Coherent  Volume:  Normal  Mood:  Anxious and Irritable  Affect:  Constricted  Thought Process:  Circumstantial  Orientation:  Full (Time, Place, and Person)  Thought Content:  WDL  Suicidal Thoughts:  No  Homicidal Thoughts:  No  Memory:  Immediate;   Fair  Judgement:  Impaired  Insight:  Lacking  Psychomotor Activity:  Normal  Concentration:  Poor  Recall:  Poor  Akathisia:  No  Handed:  Right  AIMS (if indicated):     Assets:  Desire for Improvement Housing Resilience Social Support  Sleep:  Number of Hours: 6.5   Current Medications: Current Facility-Administered Medications  Medication Dose Route Frequency Provider Last Rate Last Dose  . acetaminophen (TYLENOL) tablet 650 mg  650 mg Oral Q6H PRN Kerry Hough, PA-C      . alum & mag hydroxide-simeth (MAALOX/MYLANTA) 200-200-20 MG/5ML suspension 30 mL  30 mL Oral Q4H PRN Kerry Hough, PA-C      . ARIPiprazole (ABILIFY) tablet 5 mg  5 mg Oral BID Nehemiah Settle, MD   5 mg at 07/03/13 0801  . buPROPion [facility] SR) 12 hr tablet 150 mg  150 mg Oral BID Nehemiah Settle, MD   150 mg at 07/03/13 0801  . Linaclotide (LINZESS) capsule 290 mcg  290 mcg Oral Daily Nehemiah Settle, MD   290 mcg at 07/03/13 0802  . magnesium hydroxide (MILK OF MAGNESIA) suspension 30 mL  30 mL Oral Daily PRN Kerry Hough, PA-C      . metoprolol succinate (TOPROL-XL) 24 hr tablet 50 mg  50 mg Oral BID PC Nehemiah Settle, MD   50 mg at 07/03/13 0802  . pantoprazole (PROTONIX) EC tablet 40  mg  40 mg Oral Daily Verne Spurr, PA-C   40 mg at 07/03/13 0801  . traZODone (DESYREL) tablet 50 mg  50 mg Oral QHS,MR X 1 Kerry Hough, PA-C        Lab Results:  No results found for this or any previous visit (from the past 48 hour(s)).  Physical Findings: AIMS: Facial and Oral Movements Muscles of Facial Expression: None, normal Lips and Perioral Area: None, normal Jaw: None, normal Tongue:  None, normal,Extremity Movements Upper (arms, wrists, hands, fingers): None, normal Lower (legs, knees, ankles, toes): None, normal, Trunk Movements Neck, shoulders, hips: None, normal, Overall Severity Severity of abnormal movements (highest score from questions above): None, normal Incapacitation due to abnormal movements: None, normal Patient's awareness of abnormal movements (rate only patient's report): No Awareness, Dental Status Current problems with teeth and/or dentures?: No Does patient usually wear dentures?: No  CIWA:  CIWA-Ar Total: 0 COWS:  COWS Total Score: 0  Treatment Plan Summary: Daily contact with patient to assess and evaluate symptoms and progress in treatment Medication management  Plan: 1. Continue crisis management and stabilization. 2. Medication management to reduce current symptoms to base line and improve patient's overall level of functioning 3. Treat health problems as indicated. 4. Develop treatment plan to decrease risk of relapse upon discharge and the need for readmission. 5. Psycho-social education regarding relapse prevention and self care. 6. Health care follow up as needed for medical problems. 7. Continue home medications where appropriate. 8. ELOS: D/C in AM .  Medical Decision Making Problem Points:  Established problem, stable/improving (1) Data Points:  Review or order medicine tests (1)  I certify that inpatient services furnished can reasonably be expected to improve the patient's condition.  Rona Ravens. Mashburn RPAC 4:10 PM 07/03/2013  Reviewed the information documented and agree with the treatment plan.  Kataleia Quaranta,JANARDHAHA R. 07/04/2013 8:42 PM

## 2013-07-04 DIAGNOSIS — F3130 Bipolar disorder, current episode depressed, mild or moderate severity, unspecified: Secondary | ICD-10-CM

## 2013-07-04 MED ORDER — LINACLOTIDE 290 MCG PO CAPS: 1.0000 | ORAL_CAPSULE | Freq: Every day | ORAL | Status: DC

## 2013-07-04 MED ORDER — OMEPRAZOLE 20 MG PO CPDR: 40.0000 mg | DELAYED_RELEASE_CAPSULE | Freq: Every day | ORAL | Status: DC

## 2013-07-04 MED ORDER — ARIPIPRAZOLE 5 MG PO TABS: ORAL_TABLET | ORAL | Status: DC

## 2013-07-04 MED ORDER — HYDROCORTISONE 2.5 % EX CREA: 1.0000 "application " | TOPICAL_CREAM | Freq: Two times a day (BID) | CUTANEOUS | Status: DC | PRN

## 2013-07-04 MED ORDER — TRAZODONE HCL 50 MG PO TABS: 50.0000 mg | ORAL_TABLET | Freq: Every evening | ORAL | Status: DC | PRN

## 2013-07-04 MED ORDER — BUPROPION HCL ER (SR) 150 MG PO TB12: 150.0000 mg | ORAL_TABLET | Freq: Two times a day (BID) | ORAL | Status: DC

## 2013-07-04 MED ORDER — METOPROLOL SUCCINATE ER 50 MG PO TB24: 50.0000 mg | ORAL_TABLET | Freq: Two times a day (BID) | ORAL | Status: DC

## 2013-07-04 MED ORDER — KETOCONAZOLE 2 % EX CREA: 1.0000 "application " | TOPICAL_CREAM | Freq: Two times a day (BID) | CUTANEOUS | Status: DC

## 2013-07-04 NOTE — Progress Notes (Signed)
Patient ID: [name] [name], female   DOB: 1970-12-16, 42 y.o.   MRN: [id number]  D: Took over patient's care @ 2330. Patient in bed sleeping. Respiration regular and unlabored. No sign of distress noted at this time A: 15 mins checks for safety. R: Patient  is safe.

## 2013-07-04 NOTE — Progress Notes (Signed)
D/C instructions/meds/follow-up appointments reviewed, pt verbalized understanding, pt's belongings returned to pt, samples given. 
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
## 2013-07-04 NOTE — Progress Notes (Signed)
Adult Psychoeducational Group Note  Date:  07/04/2013 Time:  5:14 PM  Group Topic/Focus:  Relapse Prevention Planning:   The focus of this group is to define relapse and discuss the need for planning to combat relapse.  Participation Level:  Minimal  Participation Quality:  Appropriate  Affect:  Appropriate  Cognitive:  Appropriate  Insight: Appropriate and Limited  Engagement in Group:  Engaged and Poor  Modes of Intervention:  Discussion, Education, Socialization and Support  Additional Comments:  Pt stated she wanted to work on voicing her opinion and expressing herself in order to help prevent relapse.  Caswell Corwin 07/04/2013, 5:14 PM

## 2013-07-04 NOTE — Tx Team (Signed)
Interdisciplinary Treatment Plan Update (Adult)  Date: 07/04/2013  Time Reviewed:  9:45 AM  Progress in Treatment: Attending groups: Yes Participating in groups:  Yes Taking medication as prescribed:  Yes Tolerating medication:  Yes Family/Significant othe contact made: Yes Patient understands diagnosis:  Yes Discussing patient identified problems/goals with staff:  Yes Medical problems stabilized or resolved:  Yes Denies suicidal/homicidal ideation: Yes Issues/concerns per patient self-inventory:  Yes Other:  New problem(s) identified: N/A  Discharge Plan or Barriers: Pt has follow up scheduled at [facility] for medication management and therapy.    Reason for Continuation of Hospitalization: Stable to d/c  Comments: N/A  Estimated length of stay: D/C today  For review of initial/current patient goals, please see plan of care.  Attendees: Patient:  [name] [name] 07/04/2013 9:51 AM   Family:     Physician:  Dr. Javier Glazier 07/04/2013 9:40 AM   Nursing:   Lamount Cranker, RN 07/04/2013 9:40 AM   Clinical Social Worker:  Reyes Ivan, LCSWA 07/04/2013 9:40 AM   Other: Verne Spurr, PA 07/04/2013 9:40 AM   Other:  Frankey Shown, MA care coordination 07/04/2013 9:40 AM   Other:  Juline Patch, LCSW 07/04/2013 9:40 AM   Other:  Harold Barban, RN 07/04/2013 9:40 AM  Other:    Other:    Other:    Other:    Other:    Other:     Scribe for Treatment Team:   Carmina Miller, 07/04/2013 9:40 AM

## 2013-07-04 NOTE — Consult Note (Signed)
Reviewed the information documented and agree with the treatment plan.  Audrielle Vankuren,JANARDHAHA R. 07/04/2013 8:57 PM
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
## 2013-07-04 NOTE — BHH Suicide Risk Assessment (Signed)
Suicide Risk Assessment  Discharge Assessment     Demographic Factors:  Adolescent or young adult, Caucasian, Low socioeconomic status and Unemployed  Mental Status Per Nursing Assessment::   On Admission:  Self-harm behaviors  Current Mental Status by Physician: Mental Status Examination: Patient appeared as per his stated age, casually dressed, and fairly groomed, and maintaining good eye contact. Patient has good mood and his affect was constricted. He has normal rate, rhythm, and volume of speech. His thought process is linear and goal directed. Patient has denied suicidal, homicidal ideations, intentions or plans. Patient has no evidence of auditory or visual hallucinations, delusions, and paranoia. Patient has fair insight judgment and impulse control.  Loss Factors: Decrease in vocational status and Financial problems/change in socioeconomic status  Historical Factors: Prior suicide attempts and Impulsivity  Risk Reduction Factors:   Sense of responsibility to family, Religious beliefs about death, Employed, Living with another person, especially a relative, Positive social support, Positive therapeutic relationship and Positive coping skills or problem solving skills  Continued Clinical Symptoms:  Bipolar Disorder:   Mixed State Depression:   Comorbid alcohol abuse/dependence Impulsivity Recent sense of peace/wellbeing Alcohol/Substance Abuse/Dependencies Previous Psychiatric Diagnoses and Treatments Medical Diagnoses and Treatments/Surgeries  Cognitive Features That Contribute To Risk:  Polarized thinking    Suicide Risk:  Minimal: No identifiable suicidal ideation.  Patients presenting with no risk factors but with morbid ruminations; may be classified as minimal risk based on the severity of the depressive symptoms  Discharge Diagnoses:   AXIS I:  Bipolar, Depressed, Substance Induced Mood Disorder and Cocaine abuse AXIS II:  Deferred AXIS III:   Past Medical  History  Diagnosis Date  . GERD (gastroesophageal reflux disease)   . Depression   . Hyperlipidemia     no meds - diet  controlled  . PCOS (polycystic ovarian syndrome)   . Migraines   . SVD (spontaneous vaginal delivery)     x 2  . Tachycardia     occasional - tx with toprol xl prn -   . Anxiety   . Osteoarthritis     no med  . Dysrhythmia     sinus tachycardia- on toprol  . Suicidal overdose   . Kidney stone    AXIS IV:  economic problems, occupational problems, other psychosocial or environmental problems, problems related to social environment and problems with primary support group AXIS V:  61-70 mild symptoms  Plan Of Care/Follow-up recommendations:  Activity:  As tolerated  Diet:  Regular  Is patient on multiple antipsychotic therapies at discharge:  No   Has Patient had three or more failed trials of antipsychotic monotherapy by history:  No  Recommended Plan for Multiple Antipsychotic Therapies: Not applicable  Nehemiah Settle., M.D. 07/04/2013, 12:05 PM

## 2013-07-04 NOTE — BHH Group Notes (Signed)
[facility] LCSW Aftercare Discharge Planning Group Note   07/04/2013 8:45 AM  Participation Quality:  Alert and Appropriate   Mood/Affect:  Appropriate and Calm   Depression Rating:  1  Anxiety Rating:  1  Thoughts of Suicide:  Pt denies SI/HI  Will you contract for safety?   Yes  Current AVH: Pt denies  Plan for Discharge/Comments:  Pt attended discharge planning group and actively participated in group.  CSW provided pt with today's workbook.  Pt reports feeling stable to d/c today.  Pt will reside with parents upon d/c in [place].  Pt has follow up scheduled at [facility] for medication management and therapy.  No further needs voiced by pt at this time.    Transportation Means: Pt has access to transportation  Supports: No supports mentioned at this time  Reyes Ivan, LCSWA 07/04/2013 10:02 AM

## 2013-07-04 NOTE — Progress Notes (Signed)
Adult Psychoeducational Group Note  Date:  07/04/2013 Time:  11:39 AM  Group Topic/Focus:  Therapeutic Activity  Participation Level:  Did Not Attend   [name] [name] 07/04/2013, 11:39 AM

## 2013-07-07 NOTE — Progress Notes (Signed)
Patient Discharge Instructions:  After Visit Summary (AVS):   Faxed to:  07/07/13 Discharge Summary Note:   Faxed to:  07/07/13 Psychiatric Admission Assessment Note:   Faxed to:  07/07/13 Suicide Risk Assessment - Discharge Assessment:   Faxed to:  07/07/13  Tonna Corner, 07/07/2013, 5:33 PM  Faxed to [facility] at [phone]

## 2013-08-20 ENCOUNTER — Ambulatory Visit: Payer: Self-pay | Admitting: Physician Assistant

## 2013-08-20 VITALS — BP 124/76 | HR 102 | Temp 98.3°F | Resp 18 | Wt 206.8 lb

## 2013-08-20 DIAGNOSIS — R300 Dysuria: Secondary | ICD-10-CM

## 2013-08-20 DIAGNOSIS — N390 Urinary tract infection, site not specified: Secondary | ICD-10-CM

## 2013-08-20 LAB — POCT URINALYSIS DIPSTICK
Bilirubin, UA: NEGATIVE
Glucose, UA: NEGATIVE
Ketones, UA: NEGATIVE
Spec Grav, UA: >=1.03

## 2013-08-20 LAB — POCT UA - MICROSCOPIC ONLY: Crystals, Ur, HPF, POC: NEGATIVE

## 2013-08-20 MED ORDER — CIPROFLOXACIN HCL 250 MG PO TABS: 250.0000 mg | ORAL_TABLET | Freq: Two times a day (BID) | ORAL | Status: DC

## 2013-08-20 MED ORDER — FLUCONAZOLE 150 MG PO TABS: 150.0000 mg | ORAL_TABLET | Freq: Once | ORAL | Status: DC

## 2013-08-20 NOTE — Progress Notes (Signed)
  Subjective:    Patient ID: [name] [name], female    DOB: August 02, 1971, 42 y.o.   MRN: [id number]  HPI   [name] [name] is a very pleasant 42 yr old female here with concern for UTI.  Reports approx 1 wk of dysuria, cloudy urine, and strong urine odor.  Urgency and frequency as well.  +low abd pain.  No back pain, fever, chills  +nausea, no vomiting.  Has had UTIs previously, last about 2 months ago.  Has tried cranberry pills with no relief.  Has used Tylenol for pain.  Denies vaginal discharge, bleeding, dyspareunia.     Review of Systems  Constitutional: Negative for fever and chills.  HENT: Negative.   Respiratory: Negative.   Cardiovascular: Negative.   Gastrointestinal: Positive for nausea and abdominal pain (lower). Negative for vomiting.  Genitourinary: Positive for dysuria, urgency and frequency. Negative for hematuria, flank pain, vaginal bleeding, vaginal discharge and vaginal pain.  Musculoskeletal: Negative.   Skin: Negative.   Neurological: Negative.        Objective:   Physical Exam  Vitals reviewed. Constitutional: She is oriented to person, place, and time. She appears well-developed and well-nourished. No distress.  HENT:  Head: Normocephalic and atraumatic.  Eyes: Conjunctivae are normal. No scleral icterus.  Cardiovascular: Normal rate, regular rhythm and normal heart sounds.   Pulmonary/Chest: Effort normal and breath sounds normal. She has no wheezes. She has no rales.  Abdominal: Bowel sounds are normal. There is no tenderness. There is no CVA tenderness.  Neurological: She is alert and oriented to person, place, and time.  Skin: Skin is warm and dry.  Psychiatric: She has a normal mood and affect. Her behavior is normal.    Results for orders placed in visit on 08/20/13  POCT URINALYSIS DIPSTICK      Result Value Range   Color, UA yellow     Clarity, UA cloudy     Glucose, UA neg     Bilirubin, UA neg     Ketones, UA neg     Spec Grav, UA >=1.030     Blood, UA large     pH, UA 5.5     Protein, UA 100     Urobilinogen, UA 0.2     Nitrite, UA positive     Leukocytes, UA moderate (2+)    POCT UA - MICROSCOPIC ONLY      Result Value Range   WBC, Ur, HPF, POC TNTC     RBC, urine, microscopic TNTC     Bacteria, U Microscopic 1+     Mucus, UA neg     Epithelial cells, urine per micros 0-1     Crystals, Ur, HPF, POC neg     Casts, Ur, LPF, POC neg     Yeast, UA neg         Assessment & Plan:  UTI (urinary tract infection) - Plan: Urine culture, ciprofloxacin (CIPRO) 250 MG tablet  Dysuria - Plan: POCT urinalysis dipstick, POCT UA - Microscopic Only   [name] [name] is a pleasant 42 yr old female here with UTI.  Will treat with cipro BID x 5 days.  OTC azo prn symptoms for the next 2 days.  Push fluids.  Will send urine cx and adjust therapy if necessary.  Fluconazole sent as well in case pt develops a yeast infection.  RTC if worsening or not improving.

## 2013-08-20 NOTE — Patient Instructions (Addendum)
Begin taking the antibiotic as directed.  Be sure to finish the full course.  Drink plenty of fluids (water is best!)  You may take over the counter Azo to help with symptoms for the next two days if you would like.  I will let you know when your urine culture results are back and if we need to make any changes based on that.  Please let me know if any symptoms are worsening or not improving.   Urinary Tract Infection Urinary tract infections (UTIs) can develop anywhere along your urinary tract. Your urinary tract is your body's drainage system for removing wastes and extra water. Your urinary tract includes two kidneys, two ureters, a bladder, and a urethra. Your kidneys are a pair of bean-shaped organs. Each kidney is about the size of your fist. They are located below your ribs, one on each side of your spine. CAUSES Infections are caused by microbes, which are microscopic organisms, including fungi, viruses, and bacteria. These organisms are so small that they can only be seen through a microscope. Bacteria are the microbes that most commonly cause UTIs. SYMPTOMS  Symptoms of UTIs may vary by age and gender of the patient and by the location of the infection. Symptoms in young women typically include a frequent and intense urge to urinate and a painful, burning feeling in the bladder or urethra during urination. Older women and men are more likely to be tired, shaky, and weak and have muscle aches and abdominal pain. A fever may mean the infection is in your kidneys. Other symptoms of a kidney infection include pain in your back or sides below the ribs, nausea, and vomiting. DIAGNOSIS To diagnose a UTI, your caregiver will ask you about your symptoms. Your caregiver also will ask to provide a urine sample. The urine sample will be tested for bacteria and white blood cells. White blood cells are made by your body to help fight infection. TREATMENT  Typically, UTIs can be treated with medication.  Because most UTIs are caused by a bacterial infection, they usually can be treated with the use of antibiotics. The choice of antibiotic and length of treatment depend on your symptoms and the type of bacteria causing your infection. HOME CARE INSTRUCTIONS  If you were prescribed antibiotics, take them exactly as your caregiver instructs you. Finish the medication even if you feel better after you have only taken some of the medication.  Drink enough water and fluids to keep your urine clear or pale yellow.  Avoid caffeine, tea, and carbonated beverages. They tend to irritate your bladder.  Empty your bladder often. Avoid holding urine for long periods of time.  Empty your bladder before and after sexual intercourse.  After a bowel movement, women should cleanse from front to back. Use each tissue only once. SEEK MEDICAL CARE IF:   You have back pain.  You develop a fever.  Your symptoms do not begin to resolve within 3 days. SEEK IMMEDIATE MEDICAL CARE IF:   You have severe back pain or lower abdominal pain.  You develop chills.  You have nausea or vomiting.  You have continued burning or discomfort with urination. MAKE SURE YOU:   Understand these instructions.  Will watch your condition.  Will get help right away if you are not doing well or get worse. Document Released: 08/16/2005 Document Revised: 05/07/2012 Document Reviewed: 12/15/2011 [facility] Patient Information 2014 [place], [place].

## 2013-08-27 ENCOUNTER — Telehealth: Payer: Self-pay

## 2013-08-27 NOTE — Telephone Encounter (Signed)
Lab results

## 2013-08-27 NOTE — Telephone Encounter (Signed)
Pt notified of labs. Not 100% better, still having urgency, but will give a few days and RTC if not well for a recheck of her urine

## 2014-06-29 DIAGNOSIS — Z0000 Encounter for general adult medical examination without abnormal findings: Secondary | ICD-10-CM

## 2014-07-27 ENCOUNTER — Emergency Department (HOSPITAL_COMMUNITY)
Admission: EM | Admit: 2014-07-27 | Discharge: 2014-07-27 | Disposition: A | Discharge status: Home / Self Care | Payer: 59 | Attending: Emergency Medicine | Admitting: Emergency Medicine

## 2014-07-27 ENCOUNTER — Encounter (HOSPITAL_COMMUNITY): Payer: Self-pay | Admitting: Emergency Medicine

## 2014-07-27 DIAGNOSIS — N23 Unspecified renal colic: Secondary | ICD-10-CM

## 2014-07-27 DIAGNOSIS — Z8739 Personal history of other diseases of the musculoskeletal system and connective tissue: Secondary | ICD-10-CM | POA: Insufficient documentation

## 2014-07-27 DIAGNOSIS — F17200 Nicotine dependence, unspecified, uncomplicated: Secondary | ICD-10-CM | POA: Insufficient documentation

## 2014-07-27 DIAGNOSIS — Z9889 Other specified postprocedural states: Secondary | ICD-10-CM | POA: Insufficient documentation

## 2014-07-27 DIAGNOSIS — Z8639 Personal history of other endocrine, nutritional and metabolic disease: Secondary | ICD-10-CM | POA: Insufficient documentation

## 2014-07-27 DIAGNOSIS — R109 Unspecified abdominal pain: Secondary | ICD-10-CM | POA: Insufficient documentation

## 2014-07-27 DIAGNOSIS — Z3202 Encounter for pregnancy test, result negative: Secondary | ICD-10-CM | POA: Insufficient documentation

## 2014-07-27 DIAGNOSIS — I498 Other specified cardiac arrhythmias: Secondary | ICD-10-CM | POA: Insufficient documentation

## 2014-07-27 DIAGNOSIS — F411 Generalized anxiety disorder: Secondary | ICD-10-CM | POA: Insufficient documentation

## 2014-07-27 DIAGNOSIS — Z8742 Personal history of other diseases of the female genital tract: Secondary | ICD-10-CM | POA: Insufficient documentation

## 2014-07-27 DIAGNOSIS — Z87442 Personal history of urinary calculi: Secondary | ICD-10-CM | POA: Insufficient documentation

## 2014-07-27 DIAGNOSIS — Z862 Personal history of diseases of the blood and blood-forming organs and certain disorders involving the immune mechanism: Secondary | ICD-10-CM | POA: Insufficient documentation

## 2014-07-27 DIAGNOSIS — Z8679 Personal history of other diseases of the circulatory system: Secondary | ICD-10-CM | POA: Insufficient documentation

## 2014-07-27 DIAGNOSIS — F329 Major depressive disorder, single episode, unspecified: Secondary | ICD-10-CM | POA: Insufficient documentation

## 2014-07-27 DIAGNOSIS — N390 Urinary tract infection, site not specified: Secondary | ICD-10-CM | POA: Insufficient documentation

## 2014-07-27 DIAGNOSIS — F3289 Other specified depressive episodes: Secondary | ICD-10-CM | POA: Insufficient documentation

## 2014-07-27 DIAGNOSIS — R319 Hematuria, unspecified: Secondary | ICD-10-CM

## 2014-07-27 DIAGNOSIS — Z9089 Acquired absence of other organs: Secondary | ICD-10-CM | POA: Insufficient documentation

## 2014-07-27 DIAGNOSIS — R10A1 Flank pain, right side: Secondary | ICD-10-CM

## 2014-07-27 DIAGNOSIS — Z79899 Other long term (current) drug therapy: Secondary | ICD-10-CM | POA: Insufficient documentation

## 2014-07-27 DIAGNOSIS — Z8719 Personal history of other diseases of the digestive system: Secondary | ICD-10-CM | POA: Insufficient documentation

## 2014-07-27 LAB — COMPREHENSIVE METABOLIC PANEL
ALT: 17 U/L (ref 0–35)
ANION GAP: 16 — AB (ref 5–15)
AST: 15 U/L (ref 0–37)
Albumin: 4.2 g/dL (ref 3.5–5.2)
Alkaline Phosphatase: 83 U/L (ref 39–117)
BUN: 17 mg/dL (ref 6–23)
CALCIUM: 10 mg/dL (ref 8.4–10.5)
CO2: 23 mEq/L (ref 19–32)
CREATININE: 0.87 mg/dL (ref 0.50–1.10)
Chloride: 100 mEq/L (ref 96–112)
GFR calc non Af Amer: 82 mL/min — ABNORMAL LOW (ref >90)
GLUCOSE: 121 mg/dL — AB (ref 70–99)
Potassium: 3.8 mEq/L (ref 3.7–5.3)
SODIUM: 139 meq/L (ref 137–147)
TOTAL PROTEIN: 8.7 g/dL — AB (ref 6.0–8.3)
Total Bilirubin: 0.3 mg/dL (ref 0.3–1.2)

## 2014-07-27 LAB — URINALYSIS, ROUTINE W REFLEX MICROSCOPIC
BILIRUBIN URINE: NEGATIVE
Glucose, UA: NEGATIVE mg/dL
KETONES UR: NEGATIVE mg/dL
Leukocytes, UA: NEGATIVE
NITRITE: NEGATIVE
PH: 6.5 (ref 5.0–8.0)
Protein, ur: 30 mg/dL — AB
Specific Gravity, Urine: 1.024 (ref 1.005–1.030)
Urobilinogen, UA: 0.2 mg/dL (ref 0.0–1.0)

## 2014-07-27 LAB — CBC WITH DIFFERENTIAL/PLATELET
Basophils Absolute: 0 10*3/uL (ref 0.0–0.1)
Basophils Relative: 0 % (ref 0–1)
EOS ABS: 0.3 10*3/uL (ref 0.0–0.7)
EOS PCT: 3 % (ref 0–5)
HCT: 42 % (ref 36.0–46.0)
Hemoglobin: 14.1 g/dL (ref 12.0–15.0)
LYMPHS ABS: 3.2 10*3/uL (ref 0.7–4.0)
Lymphocytes Relative: 27 % (ref 12–46)
MCH: 28.4 pg (ref 26.0–34.0)
MCHC: 33.6 g/dL (ref 30.0–36.0)
MCV: 84.5 fL (ref 78.0–100.0)
MONOS PCT: 9 % (ref 3–12)
Monocytes Absolute: 1.1 10*3/uL — ABNORMAL HIGH (ref 0.1–1.0)
Neutro Abs: 7.3 10*3/uL (ref 1.7–7.7)
Neutrophils Relative %: 61 % (ref 43–77)
PLATELETS: 370 10*3/uL (ref 150–400)
RBC: 4.97 MIL/uL (ref 3.87–5.11)
RDW: 12.7 % (ref 11.5–15.5)
WBC: 12 10*3/uL — ABNORMAL HIGH (ref 4.0–10.5)

## 2014-07-27 LAB — URINE MICROSCOPIC-ADD ON

## 2014-07-27 LAB — POC URINE PREG, ED: Preg Test, Ur: NEGATIVE

## 2014-07-27 LAB — LIPASE, BLOOD: Lipase: 32 U/L (ref 11–59)

## 2014-07-27 MED ORDER — METOCLOPRAMIDE HCL 5 MG/ML IJ SOLN
10.0000 mg | Freq: Once | INTRAMUSCULAR | Status: AC
  Administered 2014-07-27: 10 mg via INTRAVENOUS
  Filled 2014-07-27: qty 2

## 2014-07-27 MED ORDER — MORPHINE SULFATE 4 MG/ML IJ SOLN
4.0000 mg | Freq: Once | INTRAMUSCULAR | Status: AC
  Administered 2014-07-27: 4 mg via INTRAVENOUS
  Filled 2014-07-27: qty 1

## 2014-07-27 MED ORDER — ONDANSETRON HCL 4 MG/2ML IJ SOLN
4.0000 mg | Freq: Once | INTRAMUSCULAR | Status: AC
  Administered 2014-07-27: 4 mg via INTRAVENOUS
  Filled 2014-07-27: qty 2

## 2014-07-27 MED ORDER — HYDROCODONE-ACETAMINOPHEN 5-325 MG PO TABS: 1.0000 | ORAL_TABLET | ORAL | Status: DC | PRN

## 2014-07-27 MED ORDER — SODIUM CHLORIDE 0.9 % IV BOLUS (SEPSIS)
1000.0000 mL | Freq: Once | INTRAVENOUS | Status: AC
  Administered 2014-07-27: 1000 mL via INTRAVENOUS

## 2014-07-27 NOTE — Discharge Instructions (Signed)
Please followup with the urology specialist for continued evaluation and treatment. Have reevaluation of your symptoms in the next one to 2 days. Return sooner if you have any changing or worsening symptoms.    Flank Pain Flank pain refers to pain that is located on the side of the body between the upper abdomen and the back. The pain may occur over a short period of time (acute) or may be long-term or reoccurring (chronic). It may be mild or severe. Flank pain can be caused by many things. CAUSES  Some of the more common causes of flank pain include:  Muscle strains.   Muscle spasms.   A disease of your spine (vertebral disk disease).   A lung infection (pneumonia).   Fluid around your lungs (pulmonary edema).   A kidney infection.   Kidney stones.   A very painful skin rash caused by the chickenpox virus (shingles).   Gallbladder disease.  [place] care will depend on the cause of your pain. In general,  Rest as directed by your caregiver.  Drink enough fluids to keep your urine clear or pale yellow.  Only take over-the-counter or prescription medicines as directed by your caregiver. Some medicines may help relieve the pain.  Tell your caregiver about any changes in your pain.  Follow up with your caregiver as directed. SEEK IMMEDIATE MEDICAL CARE IF:   Your pain is not controlled with medicine.   You have new or worsening symptoms.  Your pain increases.   You have abdominal pain.   You have shortness of breath.   You have persistent nausea or vomiting.   You have swelling in your abdomen.   You feel faint or pass out.   You have blood in your urine.  You have a fever or persistent symptoms for more than 2-3 days.  You have a fever and your symptoms suddenly get worse. MAKE SURE YOU:   Understand these instructions.  Will watch your condition.  Will get help right away if you are not doing well or get  worse. Document Released: 12/28/2005 Document Revised: 07/31/2012 Document Reviewed: 06/20/2012 [facility] Patient Information 2015 [place], [place]. This information is not intended to replace advice given to you by your health care provider. Make sure you discuss any questions you have with your health care provider.    Ureteral Colic (Kidney Stones) Ureteral colic is the result of a condition when kidney stones form inside the kidney. Once kidney stones are formed they may move into the tube that connects the kidney with the bladder (ureter). If this occurs, this condition may cause pain (colic) in the ureter.  CAUSES  Pain is caused by stone movement in the ureter and the obstruction caused by the stone. SYMPTOMS  The pain comes and goes as the ureter contracts around the stone. The pain is usually intense, sharp, and stabbing in character. The location of the pain may move as the stone moves through the ureter. When the stone is near the kidney the pain is usually located in the back and radiates to the belly (abdomen). When the stone is ready to pass into the bladder the pain is often located in the lower abdomen on the side the stone is located. At this location, the symptoms may mimic those of a urinary tract infection with urinary frequency. Once the stone is located here it often passes into the bladder and the pain disappears completely. TREATMENT   Your caregiver will provide you with  medicine for pain relief.  You may require specialized follow-up X-rays.  The absence of pain does not always mean that the stone has passed. It may have just stopped moving. If the urine remains completely obstructed, it can cause loss of kidney function or even complete destruction of the involved kidney. It is your responsibility and in your interest that X-rays and follow-ups as suggested by your caregiver are completed. Relief of pain without passage of the stone can be associated with severe damage to  the kidney, including loss of kidney function on that side.  If your stone does not pass on its own, additional measures may be taken by your caregiver to ensure its removal. HOME CARE INSTRUCTIONS   Increase your fluid intake. Water is the preferred fluid since juices containing vitamin C may acidify the urine making it less likely for certain stones (uric acid stones) to pass.  Strain all urine. A strainer will be provided. Keep all particulate matter or stones for your caregiver to inspect.  Take your pain medicine as directed.  Make a follow-up appointment with your caregiver as directed.  Remember that the goal is passage of your stone. The absence of pain does not mean the stone is gone. Follow your caregiver's instructions.  Only take over-the-counter or prescription medicines for pain, discomfort, or fever as directed by your caregiver. SEEK MEDICAL CARE IF:   Pain cannot be controlled with the prescribed medicine.  You have a fever.  Pain continues for longer than your caregiver advises it should.  There is a change in the pain, and you develop chest discomfort or constant abdominal pain.  You feel faint or pass out. MAKE SURE YOU:   Understand these instructions.  Will watch your condition.  Will get help right away if you are not doing well or get worse. Document Released: 08/16/2005 Document Revised: 03/03/2013 Document Reviewed: 05/03/2011 [facility] Patient Information 2015 [place], [place]. This information is not intended to replace advice given to you by your health care provider. Make sure you discuss any questions you have with your health care provider.

## 2014-07-27 NOTE — ED Notes (Signed)
Pt arrived to the Ed with a complaint of right flank pain that radiates to the right lower pelvis.  Pt state sthe pain has been present for months but that today the pain has increased to intolerable levels

## 2014-07-27 NOTE — ED Notes (Signed)
Pt states she will call her dad to pick her up.

## 2014-07-27 NOTE — ED Provider Notes (Signed)
CSN: [id number]     Arrival date & time 07/27/14  0042 History   First MD Initiated Contact with Patient 07/27/14 0216     Chief Complaint  Patient presents with  . Flank Pain     HPI  History provided by the patient. Patient is a 43 year old female with history of hyperlipidemia, GERD and kidney stones who presents with complaints of acutely worsening right flank pain. Pain began yesterday and worsened through the evening. The pain has been radiating down into the right groin. Pain seems to be worse with some positions and movements. It is also worsened with heat over the area. She does feel slightly improved with ice over the area and resting on her right side. Pain feels slightly similar to kidney stones. Patient states she has never passed a kidney stone but has had some large kidney stones within her kidney. She denies any dysuria, hematuria or urinary frequency. Denies any diarrhea or constipation. No fever, chills or sweats.    Past Medical History  Diagnosis Date  . GERD (gastroesophageal reflux disease)   . Depression   . Hyperlipidemia     no meds - diet  controlled  . PCOS (polycystic ovarian syndrome)   . Migraines   . SVD (spontaneous vaginal delivery)     x 2  . Tachycardia     occasional - tx with toprol xl prn -   . Anxiety   . Osteoarthritis     no med  . Dysrhythmia     sinus tachycardia- on toprol  . Suicidal overdose   . Kidney stone    Past Surgical History  Procedure Laterality Date  . Ovarian cyst removal    . Laparoscopy      x3 two for removal of scar tissue/adhesions  . Breast surgery      left for a papiloma: had a lump removed right breast-benign  . Wisdom tooth extraction    . Colonoscopy      x 2  . Laparoscopy  07/30/2012    Procedure: LAPAROSCOPY DIAGNOSTIC;  Surgeon: Logan Bores, MD;  Location: [facility];  Service: Gynecology;  Laterality: N/A;  . Cholecystectomy N/A 03/13/2013    Procedure: LAPAROSCOPIC CHOLECYSTECTOMY;  Surgeon:  Harl Bowie, MD;  Location: [place];  Service: General;  Laterality: N/A;   Family History  Problem Relation Age of Onset  . Diabetes Father   . Hypertension Father   . Diabetes Mother   . Hypertension Mother   . Emphysema    . Tongue cancer    . Coronary artery disease    . Asthma Paternal Grandmother   . Kidney disease Paternal Grandmother    History  Substance Use Topics  . Smoking status: Current Some Day Smoker -- 0.25 packs/day for 15 years    Types: Cigarettes    Last Attempt to Quit: 09/26/2010  . Smokeless tobacco: Never Used  . Alcohol Use: Yes     Comment: 3-4x's wkly; drinks 2 sparks daily    OB History   Grav Para Term Preterm Abortions TAB SAB Ect Mult Living                 Review of Systems  Constitutional: Negative for fever, chills, diaphoresis, appetite change and fatigue.  Gastrointestinal: Positive for abdominal pain. Negative for nausea, vomiting, diarrhea and constipation.  Genitourinary: Positive for flank pain. Negative for dysuria, frequency, hematuria, vaginal bleeding and vaginal discharge.  All other systems reviewed and are negative.  Allergies  Aspirin; Contrast media; Ibuprofen; and Nsaids  Home Medications   Prior to Admission medications   Medication Sig Start Date End Date Taking? Authorizing Provider  divalproex (DEPAKOTE ER) 250 MG 24 hr tablet Take 250 mg by mouth at bedtime.   Yes Historical Provider, MD  HYDROXYZINE PAMOATE PO Take 25 mg by mouth 2 (two) times daily as needed. For anxiety   Yes Historical Provider, MD  ziprasidone (GEODON) 60 MG capsule Take 60 mg by mouth daily.   Yes Historical Provider, MD   BP 157/103  Pulse 90  Temp(Src) 98.2 F (36.8 C) (Oral)  Resp 22  SpO2 100%  LMP 07/16/2014 Physical Exam  Nursing note and vitals reviewed. Constitutional: She is oriented to person, place, and time. She appears well-developed and well-nourished. No distress.  HENT:  Head:  Normocephalic.  Cardiovascular: Normal rate and regular rhythm.   Pulmonary/Chest: Effort normal and breath sounds normal. No respiratory distress.  Abdominal: Soft. She exhibits no distension. There is no tenderness. There is no rebound and no guarding.  There is mild right CVA tenderness. No significant pain on abdominal exam. No peritoneal signs.  Musculoskeletal: Normal range of motion.  Neurological: She is alert and oriented to person, place, and time.  Skin: Skin is warm and dry. No rash noted.  Psychiatric: She has a normal mood and affect. Her behavior is normal.    ED Course  Procedures  COORDINATION OF CARE:  Nursing notes reviewed. Vital signs reviewed. Initial pt interview and examination performed.   Filed Vitals:   07/27/14 0139 07/27/14 0140  BP: 175/107 157/103  Pulse: 90   Temp: 98.2 F (36.8 C)   TempSrc: Oral   Resp: 22   SpO2: 100%     2:21 AM- patient seen and evaluated. This appearance slight discomfort. Soft nontender abdomen. Does have slight CVA tenderness.    patient does have significant hematuria on UA. She's not menstruating. No signs of UTI. She is improved with pain medication. I discussed options for further evaluation of a possible kidney stone the patient such as a CT scan. We discussed the benefits, risks and alternatives. Alternatives included following up with her urologist at [facility] on Tuesday or returning for a recheck in the next one to 2 days. Patient states that she prefers to return home at this time with symptomatic treatment for pain and to followup with urology. She understands she can return at any time for changing or worsening symptoms. I did give strict return precautions.   Treatment plan initiated: Medications  sodium chloride 0.9 % bolus 1,000 mL (1,000 mLs Intravenous New Bag/Given 07/27/14 0249)  morphine 4 MG/ML injection 4 mg (4 mg Intravenous Given 07/27/14 0249)  ondansetron (ZOFRAN) injection 4 mg (4 mg  Intravenous Given 07/27/14 0249)  metoCLOPramide (REGLAN) injection 10 mg (10 mg Intravenous Given 07/27/14 0249)     Results for orders placed during the hospital encounter of 07/27/14  URINALYSIS, ROUTINE W REFLEX MICROSCOPIC      Result Value Ref Range   Color, Urine YELLOW  YELLOW   APPearance CLOUDY (*) CLEAR   Specific Gravity, Urine 1.024  1.005 - 1.030   pH 6.5  5.0 - 8.0   Glucose, UA NEGATIVE  NEGATIVE mg/dL   Hgb urine dipstick LARGE (*) NEGATIVE   Bilirubin Urine NEGATIVE  NEGATIVE   Ketones, ur NEGATIVE  NEGATIVE mg/dL   Protein, ur 30 (*) NEGATIVE mg/dL   Urobilinogen, UA 0.2  0.0 - 1.0 mg/dL  Nitrite NEGATIVE  NEGATIVE   Leukocytes, UA NEGATIVE  NEGATIVE  CBC WITH DIFFERENTIAL      Result Value Ref Range   WBC 12.0 (*) 4.0 - 10.5 K/uL   RBC 4.97  3.87 - 5.11 MIL/uL   Hemoglobin 14.1  12.0 - 15.0 g/dL   HCT 42.0  36.0 - 46.0 %   MCV 84.5  78.0 - 100.0 fL   MCH 28.4  26.0 - 34.0 pg   MCHC 33.6  30.0 - 36.0 g/dL   RDW 12.7  11.5 - 15.5 %   Platelets 370  150 - 400 K/uL   Neutrophils Relative % 61  43 - 77 %   Neutro Abs 7.3  1.7 - 7.7 K/uL   Lymphocytes Relative 27  12 - 46 %   Lymphs Abs 3.2  0.7 - 4.0 K/uL   Monocytes Relative 9  3 - 12 %   Monocytes Absolute 1.1 (*) 0.1 - 1.0 K/uL   Eosinophils Relative 3  0 - 5 %   Eosinophils Absolute 0.3  0.0 - 0.7 K/uL   Basophils Relative 0  0 - 1 %   Basophils Absolute 0.0  0.0 - 0.1 K/uL  COMPREHENSIVE METABOLIC PANEL      Result Value Ref Range   Sodium 139  137 - 147 mEq/L   Potassium 3.8  3.7 - 5.3 mEq/L   Chloride 100  96 - 112 mEq/L   CO2 23  19 - 32 mEq/L   Glucose, Bld 121 (*) 70 - 99 mg/dL   BUN 17  6 - 23 mg/dL   Creatinine, Ser 0.87  0.50 - 1.10 mg/dL   Calcium 10.0  8.4 - 10.5 mg/dL   Total Protein 8.7 (*) 6.0 - 8.3 g/dL   Albumin 4.2  3.5 - 5.2 g/dL   AST 15  0 - 37 U/L   ALT 17  0 - 35 U/L   Alkaline Phosphatase 83  39 - 117 U/L   Total Bilirubin 0.3  0.3 - 1.2 mg/dL   GFR calc non Af Amer 82  (*) >90 mL/min   GFR calc Af Amer >90  >90 mL/min   Anion gap 16 (*) 5 - 15  LIPASE, BLOOD      Result Value Ref Range   Lipase 32  11 - 59 U/L  URINE MICROSCOPIC-ADD ON      Result Value Ref Range   Squamous Epithelial / LPF RARE  RARE   WBC, UA 3-6  <3 WBC/hpf   RBC / HPF 21-50  <3 RBC/hpf   Bacteria, UA MANY (*) RARE   Casts HYALINE CASTS (*) NEGATIVE   Urine-Other MUCOUS PRESENT    POC URINE PREG, ED      Result Value Ref Range   Preg Test, Ur NEGATIVE  NEGATIVE      MDM   Final diagnoses:  Ureteral colic  Right flank pain  Hematuria      Martie Lee, PA-C 07/27/14 0425

## 2014-07-27 NOTE — ED Provider Notes (Signed)
Medical screening examination/treatment/procedure(s) were performed by non-physician practitioner and as supervising physician I was immediately available for consultation/collaboration.   EKG Interpretation None       Threasa Beards, MD 07/27/14 [phone]

## 2014-07-31 ENCOUNTER — Encounter (HOSPITAL_COMMUNITY): Payer: Self-pay | Admitting: Emergency Medicine

## 2014-07-31 ENCOUNTER — Emergency Department (HOSPITAL_COMMUNITY): Payer: 59

## 2014-07-31 ENCOUNTER — Emergency Department (HOSPITAL_COMMUNITY)
Admission: EM | Admit: 2014-07-31 | Discharge: 2014-07-31 | Disposition: A | Discharge status: Home / Self Care | Payer: Self-pay | Attending: Emergency Medicine | Admitting: Emergency Medicine

## 2014-07-31 DIAGNOSIS — F411 Generalized anxiety disorder: Secondary | ICD-10-CM | POA: Insufficient documentation

## 2014-07-31 DIAGNOSIS — K219 Gastro-esophageal reflux disease without esophagitis: Secondary | ICD-10-CM | POA: Insufficient documentation

## 2014-07-31 DIAGNOSIS — Z862 Personal history of diseases of the blood and blood-forming organs and certain disorders involving the immune mechanism: Secondary | ICD-10-CM | POA: Insufficient documentation

## 2014-07-31 DIAGNOSIS — M1990 Unspecified osteoarthritis, unspecified site: Secondary | ICD-10-CM | POA: Insufficient documentation

## 2014-07-31 DIAGNOSIS — Z79899 Other long term (current) drug therapy: Secondary | ICD-10-CM | POA: Insufficient documentation

## 2014-07-31 DIAGNOSIS — R079 Chest pain, unspecified: Secondary | ICD-10-CM | POA: Insufficient documentation

## 2014-07-31 DIAGNOSIS — Z8639 Personal history of other endocrine, nutritional and metabolic disease: Secondary | ICD-10-CM | POA: Insufficient documentation

## 2014-07-31 DIAGNOSIS — Z87442 Personal history of urinary calculi: Secondary | ICD-10-CM | POA: Insufficient documentation

## 2014-07-31 DIAGNOSIS — Z8679 Personal history of other diseases of the circulatory system: Secondary | ICD-10-CM | POA: Insufficient documentation

## 2014-07-31 DIAGNOSIS — R002 Palpitations: Secondary | ICD-10-CM | POA: Insufficient documentation

## 2014-07-31 DIAGNOSIS — F17200 Nicotine dependence, unspecified, uncomplicated: Secondary | ICD-10-CM | POA: Insufficient documentation

## 2014-07-31 LAB — CBC
HCT: 39.2 % (ref 36.0–46.0)
Hemoglobin: 13.3 g/dL (ref 12.0–15.0)
MCH: 28.2 pg (ref 26.0–34.0)
MCHC: 33.9 g/dL (ref 30.0–36.0)
MCV: 83.2 fL (ref 78.0–100.0)
PLATELETS: 396 10*3/uL (ref 150–400)
RBC: 4.71 MIL/uL (ref 3.87–5.11)
RDW: 12.5 % (ref 11.5–15.5)
WBC: 8.7 10*3/uL (ref 4.0–10.5)

## 2014-07-31 LAB — BASIC METABOLIC PANEL
ANION GAP: 14 (ref 5–15)
BUN: 13 mg/dL (ref 6–23)
CHLORIDE: 99 meq/L (ref 96–112)
CO2: 24 mEq/L (ref 19–32)
CREATININE: 0.68 mg/dL (ref 0.50–1.10)
Calcium: 9.4 mg/dL (ref 8.4–10.5)
GFR calc Af Amer: >90 mL/min (ref >90)
GFR calc non Af Amer: >90 mL/min (ref >90)
Glucose, Bld: 142 mg/dL — ABNORMAL HIGH (ref 70–99)
Potassium: 4.2 mEq/L (ref 3.7–5.3)
SODIUM: 137 meq/L (ref 137–147)

## 2014-07-31 LAB — I-STAT TROPONIN, ED: TROPONIN I, POC: 0 ng/mL (ref 0.00–0.08)

## 2014-07-31 MED ORDER — ONDANSETRON HCL 4 MG PO TABS: 4.0000 mg | ORAL_TABLET | Freq: Four times a day (QID) | ORAL | Status: DC

## 2014-07-31 NOTE — ED Provider Notes (Signed)
Medical screening examination/treatment/procedure(s) were performed by non-physician practitioner and as supervising physician I was immediately available for consultation/collaboration.   EKG Interpretation   Date/Time:  Friday July 31 2014 11:50:08 EDT Ventricular Rate:  87 PR Interval:  136 QRS Duration: 86 QT Interval:  384 QTC Calculation: 462 R Axis:   62 Text Interpretation:  Normal sinus rhythm Normal ECG No significant change  since last tracing Confirmed by YAO  MD, DAVID ([id number]) on 07/31/2014  3:28:02 PM        Wandra Arthurs, MD 07/31/14 2109

## 2014-07-31 NOTE — ED Provider Notes (Signed)
CSN: [id number]     Arrival date & time 07/31/14  1142 History   First MD Initiated Contact with Patient 07/31/14 1421     Chief Complaint  Patient presents with  . Chest Pain     (Consider location/radiation/quality/duration/timing/severity/associated sxs/prior Treatment) HPI [name] [name] is a 43 y.o. female with history of anxiety and SVT comes in for evaluation of chest pain. She says she has had intermittent chest pain for the past week. She describes the pain as a sharpness that in the middle of her chest that lasts a few seconds and go away. She also reports "a fluttering heart rate" that she has experienced over the past week. She says she was seen by cardiology a few years ago and diagnosed with "sinus tachycardia". She was being followed by her family practice and was given metoprolol, but she has not taken it since March due to financial constraints. She reports lying flat may make the fluttering heart rate worse. She does not know of anything that makes it better. She also reports nausea but denies vomiting. No fevers, difficulty breathing, abdominal pain, numbness or weakness. She denies any cocaine use since March.  Past Medical History  Diagnosis Date  . GERD (gastroesophageal reflux disease)   . Depression   . Hyperlipidemia     no meds - diet  controlled  . PCOS (polycystic ovarian syndrome)   . Migraines   . SVD (spontaneous vaginal delivery)     x 2  . Tachycardia     occasional - tx with toprol xl prn -   . Anxiety   . Osteoarthritis     no med  . Dysrhythmia     sinus tachycardia- on toprol  . Suicidal overdose   . Kidney stone    Past Surgical History  Procedure Laterality Date  . Ovarian cyst removal    . Laparoscopy      x3 two for removal of scar tissue/adhesions  . Breast surgery      left for a papiloma: had a lump removed right breast-benign  . Wisdom tooth extraction    . Colonoscopy      x 2  . Laparoscopy  07/30/2012    Procedure:  LAPAROSCOPY DIAGNOSTIC;  Surgeon: Logan Bores, MD;  Location: [facility];  Service: Gynecology;  Laterality: N/A;  . Cholecystectomy N/A 03/13/2013    Procedure: LAPAROSCOPIC CHOLECYSTECTOMY;  Surgeon: Harl Bowie, MD;  Location: [place];  Service: General;  Laterality: N/A;   Family History  Problem Relation Age of Onset  . Diabetes Father   . Hypertension Father   . Diabetes Mother   . Hypertension Mother   . Emphysema    . Tongue cancer    . Coronary artery disease    . Asthma Paternal Grandmother   . Kidney disease Paternal Grandmother    History  Substance Use Topics  . Smoking status: Current Some Day Smoker -- 0.25 packs/day for 15 years    Types: Cigarettes    Last Attempt to Quit: 09/26/2010  . Smokeless tobacco: Never Used  . Alcohol Use: Yes     Comment: 3-4x's wkly; drinks 2 sparks daily    OB History   Grav Para Term Preterm Abortions TAB SAB Ect Mult Living                 Review of Systems  Constitutional: Negative for fever.  HENT: Negative for sore throat.   Eyes: Negative for visual disturbance.  Respiratory: Negative for shortness of breath.   Cardiovascular: Positive for chest pain and palpitations.  Gastrointestinal: Negative for abdominal pain.  Endocrine: Negative for polyuria.  Genitourinary: Negative for dysuria.  Musculoskeletal: Negative for neck pain.  Skin: Negative for rash.  Neurological: Negative for headaches.      Allergies  Aspirin; Contrast media; Ibuprofen; and Nsaids  Home Medications   Prior to Admission medications   Medication Sig Start Date End Date Taking? Authorizing Provider  acetaminophen (TYLENOL) 325 MG tablet Take 650 mg by mouth every 4 (four) hours as needed for mild pain, fever or headache.   Yes Historical Provider, MD  divalproex (DEPAKOTE ER) 250 MG 24 hr tablet Take 250 mg by mouth at bedtime.   Yes Historical Provider, MD  HYDROcodone-acetaminophen (NORCO/VICODIN) 5-325 MG per  tablet Take 1-2 tablets by mouth every 4 (four) hours as needed for moderate pain. 07/27/14  Yes Peter S Dammen, PA-C  HYDROXYZINE PAMOATE PO Take 25 mg by mouth 2 (two) times daily as needed (for anxiety).    Yes Historical Provider, MD  omeprazole (PRILOSEC OTC) 20 MG tablet Take 20 mg by mouth daily as needed (for heartburn).   Yes Historical Provider, MD  ziprasidone (GEODON) 60 MG capsule Take 60 mg by mouth daily.   Yes Historical Provider, MD  ondansetron (ZOFRAN) 4 MG tablet Take 1 tablet (4 mg total) by mouth every 6 (six) hours. 07/31/14   Viona Gilmore [name], PA-C   BP 118/65  Pulse 74  Temp(Src) 98.5 F (36.9 C) (Oral)  Resp 16  SpO2 98%  LMP 07/16/2014 Physical Exam  Nursing note and vitals reviewed. Constitutional: She is oriented to person, place, and time. She appears well-developed and well-nourished.  HENT:  Head: Normocephalic and atraumatic.  Mouth/Throat: Oropharynx is clear and moist.  Eyes: Conjunctivae are normal. Pupils are equal, round, and reactive to light. Right eye exhibits no discharge. Left eye exhibits no discharge. No scleral icterus.  Neck: Neck supple.  Cardiovascular: Normal rate, regular rhythm and normal heart sounds.   Pulmonary/Chest: Effort normal and breath sounds normal. No respiratory distress. She has no wheezes. She has no rales.  Abdominal: Soft. There is no tenderness.  Musculoskeletal: She exhibits no tenderness.  Neurological: She is alert and oriented to person, place, and time.  Cranial Nerves II-XII grossly intact  Skin: Skin is warm and dry. No rash noted.  Psychiatric: She has a normal mood and affect.    ED Course  Procedures (including critical care time) Labs Review Labs Reviewed  BASIC METABOLIC PANEL - Abnormal; Notable for the following:    Glucose, Bld 142 (*)    All other components within normal limits  CBC  I-STAT TROPOININ, ED    Imaging Review Dg Chest 2 View  07/31/2014   CLINICAL DATA:  Mid to left chest  pain  EXAM: CHEST  2 VIEW  COMPARISON:  Chest radiograph 04/02/2009  FINDINGS: The heart size and mediastinal contours are within normal limits. Both lungs are clear. The visualized skeletal structures are unremarkable.  IMPRESSION: No active cardiopulmonary disease.   Electronically Signed   By: Curlene Dolphin M.D.   On: 07/31/2014 13:33     EKG Interpretation   Date/Time:  Friday July 31 2014 11:50:08 EDT Ventricular Rate:  87 PR Interval:  136 QRS Duration: 86 QT Interval:  384 QTC Calculation: 462 R Axis:   62 Text Interpretation:  Normal sinus rhythm Normal ECG No significant change  since last tracing Confirmed by  YAO  MD, DAVID ([id number]) on 07/31/2014  3:28:02 PM     Meds given in ED:  Medications - No data to display  New Prescriptions   ONDANSETRON (ZOFRAN) 4 MG TABLET    Take 1 tablet (4 mg total) by mouth every 6 (six) hours.   Filed Vitals:   07/31/14 1445 07/31/14 1457 07/31/14 1500 07/31/14 1600  BP: 132/93  129/91 118/65  Pulse: 94 91 87 74  Temp:      TempSrc:      Resp: 13 12 16 16   SpO2: 99% 99% 99% 98%    MDM  Vitals stable - WNL -afebrile Pt resting comfortably in ED. no chest pain now, nausea resolved PE and clinical picture not concerning for emergent pathology. Less likely to be ACS, PE, dissection. Labwork essentially normal. Troponin negative. Symptomology likely due to anxiety. Imaging shows no acute cardiopulmonary pathology Will DC with Zofran ODT for intermittent nausea Discussed the importance of followup with primary care and/or cardiology in order to reestablish care and return to pharmacologic therapy if necessary. Discussed f/u with PCP and return precautions, pt very amenable to plan.    Final diagnoses:  Chest pain, unspecified chest pain type  Prior to patient discharge, I discussed and reviewed this case with Dr.Yao        Verl Dicker, PA-C 07/31/14 1629

## 2014-07-31 NOTE — ED Notes (Signed)
She states shes had CP in the L side of her chest and intermittent "racing heart" for past few days. She states its worse when she moves and lies flat. shes a&ox4, resp e/u

## 2014-07-31 NOTE — Discharge Instructions (Signed)

## 2014-09-10 ENCOUNTER — Other Ambulatory Visit (HOSPITAL_COMMUNITY): Payer: Self-pay | Admitting: Family Medicine

## 2014-09-10 DIAGNOSIS — Z1231 Encounter for screening mammogram for malignant neoplasm of breast: Secondary | ICD-10-CM

## 2014-09-25 ENCOUNTER — Ambulatory Visit (HOSPITAL_COMMUNITY)
Admission: RE | Admit: 2014-09-25 | Discharge: 2014-09-25 | Disposition: A | Discharge status: Home / Self Care | Payer: Self-pay | Source: Ambulatory Visit | Attending: Family Medicine | Admitting: Family Medicine

## 2014-09-25 DIAGNOSIS — Z1231 Encounter for screening mammogram for malignant neoplasm of breast: Secondary | ICD-10-CM

## 2015-01-01 DIAGNOSIS — M797 Fibromyalgia: Secondary | ICD-10-CM | POA: Insufficient documentation

## 2015-07-21 ENCOUNTER — Ambulatory Visit: Payer: Self-pay | Admitting: Physical Therapy

## 2015-08-27 ENCOUNTER — Other Ambulatory Visit: Payer: Self-pay | Admitting: Neurological Surgery

## 2015-08-30 NOTE — Pre-Procedure Instructions (Signed)
    [name] [name]  08/30/2015      [facility] - [place], [place] - [address] [address] [place] [address] Phone: [phone] Fax: [phone]  [facility] [phone] [place], [place] - [place] [address] Phone: [phone] Fax: [phone]  [place], [place] [address] Phone: [phone] Fax: [phone]    Your procedure is scheduled on 09-01-2015   Wednesday   Report to [facility] Admitting at 6:30 A.M.   Call this number if you have problems the morning of surgery:  [phone]   Remember:   Do not eat food or drink liquids after midnight.   Take these medicines the morning of surgery with A SIP OF WATER  Tylenol if needed,omeprazole(Prilosec),pregablin(Lyricia)    Do not wear jewelry, make-up or nail polish.  Do not wear lotions, powders, or perfumes.  You may not wear deodorant.  Do not shave 48 hours prior to surgery.   Do not bring valuables to the hospital.  [facility] is not responsible for any belongings or valuables.  Contacts, dentures or bridgework may not be worn into surgery.  Leave your suitcase in the car.  After surgery it may be brought to your room.  For patients admitted to the hospital, discharge time will be determined by your treatment team.  Patients discharged the day of surgery will not be allowed to drive home.    Special instructions:  See attached Sheet for instructions on CHG showers  Please read over the following fact sheets that you were given. Pain Booklet, Coughing and Deep Breathing and Surgical Site Infection Prevention

## 2015-08-31 ENCOUNTER — Encounter (HOSPITAL_COMMUNITY): Payer: Self-pay

## 2015-08-31 ENCOUNTER — Encounter (HOSPITAL_COMMUNITY)
Admission: RE | Admit: 2015-08-31 | Discharge: 2015-08-31 | Disposition: A | Discharge status: Home / Self Care | Payer: Medicaid Other | Source: Ambulatory Visit | Attending: Neurological Surgery | Admitting: Neurological Surgery

## 2015-08-31 HISTORY — DX: Sleep apnea, unspecified: G47.30

## 2015-08-31 HISTORY — DX: Fibromyalgia: M79.7

## 2015-08-31 LAB — BASIC METABOLIC PANEL
Anion gap: 8 (ref 5–15)
BUN: 13 mg/dL (ref 6–20)
CALCIUM: 8.7 mg/dL — AB (ref 8.9–10.3)
CO2: 26 mmol/L (ref 22–32)
CREATININE: 0.93 mg/dL (ref 0.44–1.00)
Chloride: 103 mmol/L (ref 101–111)
GFR calc non Af Amer: >60 mL/min (ref >60)
Glucose, Bld: 100 mg/dL — ABNORMAL HIGH (ref 65–99)
Potassium: 4.2 mmol/L (ref 3.5–5.1)
SODIUM: 137 mmol/L (ref 135–145)

## 2015-08-31 LAB — CBC
HCT: 41.4 % (ref 36.0–46.0)
Hemoglobin: 13.3 g/dL (ref 12.0–15.0)
MCH: 28.6 pg (ref 26.0–34.0)
MCHC: 32.1 g/dL (ref 30.0–36.0)
MCV: 89 fL (ref 78.0–100.0)
PLATELETS: 269 10*3/uL (ref 150–400)
RBC: 4.65 MIL/uL (ref 3.87–5.11)
RDW: 13.2 % (ref 11.5–15.5)
WBC: 10.1 10*3/uL (ref 4.0–10.5)

## 2015-08-31 LAB — SURGICAL PCR SCREEN
MRSA, PCR: NEGATIVE
STAPHYLOCOCCUS AUREUS: NEGATIVE

## 2015-08-31 LAB — HCG, SERUM, QUALITATIVE: PREG SERUM: NEGATIVE

## 2015-08-31 MED ORDER — CEFAZOLIN SODIUM-DEXTROSE 2-3 GM-% IV SOLR
2.0000 g | INTRAVENOUS | Status: DC
  Filled 2015-08-31: qty 50

## 2015-08-31 NOTE — Anesthesia Preprocedure Evaluation (Addendum)
Anesthesia Evaluation  Patient identified by MRN, date of birth, ID band Patient awake    Reviewed: Allergy & Precautions, H&P , NPO status , Patient's Chart, lab work & pertinent test results, reviewed documented beta blocker date and time   Airway Mallampati: II  TM Distance: >3 FB Neck ROM: Full    Dental no notable dental hx. (+) Teeth Intact, Dental Advisory Given   Pulmonary sleep apnea , Current Smoker,    Pulmonary exam normal breath sounds clear to auscultation       Cardiovascular hypertension, Pt. on home beta blockers + dysrhythmias  Rhythm:Regular Rate:Normal     Neuro/Psych  Headaches, Anxiety Depression    GI/Hepatic Neg liver ROS, GERD  Medicated and Controlled,  Endo/Other  Hypothyroidism   Renal/GU negative Renal ROS  negative genitourinary   Musculoskeletal  (+) Arthritis , Osteoarthritis,  Fibromyalgia -  Abdominal   Peds  Hematology negative hematology ROS (+)   Anesthesia Other Findings   Reproductive/Obstetrics negative OB ROS                            Anesthesia Physical Anesthesia Plan  ASA: III  Anesthesia Plan: General   Post-op Pain Management:    Induction: Intravenous  Airway Management Planned: Oral ETT  Additional Equipment:   Intra-op Plan:   Post-operative Plan: Extubation in OR  Informed Consent: I have reviewed the patients History and Physical, chart, labs and discussed the procedure including the risks, benefits and alternatives for the proposed anesthesia with the patient or authorized representative who has indicated his/her understanding and acceptance.   Dental advisory given  Plan Discussed with: CRNA  Anesthesia Plan Comments:         Anesthesia Quick Evaluation

## 2015-09-01 ENCOUNTER — Encounter (HOSPITAL_COMMUNITY): Payer: Self-pay | Admitting: *Deleted

## 2015-09-01 ENCOUNTER — Inpatient Hospital Stay (HOSPITAL_COMMUNITY)
Admission: RE | Admit: 2015-09-01 | Discharge: 2015-09-02 | DRG: 473 | Disposition: A | Discharge status: Home / Self Care | Payer: Medicaid Other | Source: Ambulatory Visit | Attending: Neurological Surgery | Admitting: Neurological Surgery

## 2015-09-01 ENCOUNTER — Ambulatory Visit (HOSPITAL_COMMUNITY): Payer: Medicaid Other | Admitting: Anesthesiology

## 2015-09-01 ENCOUNTER — Inpatient Hospital Stay (HOSPITAL_COMMUNITY): Payer: Medicaid Other

## 2015-09-01 ENCOUNTER — Encounter (HOSPITAL_COMMUNITY)
Admission: RE | Disposition: A | Discharge status: Home / Self Care | Payer: Self-pay | Source: Ambulatory Visit | Attending: Neurological Surgery

## 2015-09-01 DIAGNOSIS — M4312 Spondylolisthesis, cervical region: Secondary | ICD-10-CM | POA: Diagnosis not present

## 2015-09-01 DIAGNOSIS — Z419 Encounter for procedure for purposes other than remedying health state, unspecified: Secondary | ICD-10-CM

## 2015-09-01 DIAGNOSIS — Z01810 Encounter for preprocedural cardiovascular examination: Secondary | ICD-10-CM

## 2015-09-01 DIAGNOSIS — Z01812 Encounter for preprocedural laboratory examination: Secondary | ICD-10-CM

## 2015-09-01 DIAGNOSIS — M4722 Other spondylosis with radiculopathy, cervical region: Secondary | ICD-10-CM | POA: Diagnosis not present

## 2015-09-01 DIAGNOSIS — M4802 Spinal stenosis, cervical region: Secondary | ICD-10-CM | POA: Diagnosis present

## 2015-09-01 DIAGNOSIS — M542 Cervicalgia: Secondary | ICD-10-CM | POA: Diagnosis present

## 2015-09-01 HISTORY — PX: ANTERIOR CERVICAL DECOMP/DISCECTOMY FUSION: SHX1161

## 2015-09-01 SURGERY — ANTERIOR CERVICAL DECOMPRESSION/DISCECTOMY FUSION 1 LEVEL
Anesthesia: General

## 2015-09-01 MED ORDER — DEXAMETHASONE SODIUM PHOSPHATE 10 MG/ML IJ SOLN
INTRAMUSCULAR | Status: AC
  Filled 2015-09-01: qty 1

## 2015-09-01 MED ORDER — STERILE WATER FOR INJECTION IJ SOLN
INTRAMUSCULAR | Status: AC
  Filled 2015-09-01: qty 10

## 2015-09-01 MED ORDER — PREGABALIN 75 MG PO CAPS: 300.0000 mg | ORAL_CAPSULE | Freq: Every morning | ORAL | Status: DC

## 2015-09-01 MED ORDER — ACETAMINOPHEN 325 MG PO TABS: 650.0000 mg | ORAL_TABLET | ORAL | Status: DC | PRN

## 2015-09-01 MED ORDER — PREGABALIN 75 MG PO CAPS
150.0000 mg | ORAL_CAPSULE | Freq: Every evening | ORAL | Status: DC
  Administered 2015-09-01: 150 mg via ORAL
  Filled 2015-09-01: qty 2

## 2015-09-01 MED ORDER — ONDANSETRON HCL 4 MG/2ML IJ SOLN: 4.0000 mg | INTRAMUSCULAR | Status: DC | PRN

## 2015-09-01 MED ORDER — EPHEDRINE SULFATE 50 MG/ML IJ SOLN
INTRAMUSCULAR | Status: DC | PRN
  Administered 2015-09-01 (×2): 5 mg via INTRAVENOUS
  Administered 2015-09-01: 10 mg via INTRAVENOUS

## 2015-09-01 MED ORDER — PROPOFOL 10 MG/ML IV BOLUS
INTRAVENOUS | Status: AC
  Filled 2015-09-01: qty 20

## 2015-09-01 MED ORDER — DIVALPROEX SODIUM 500 MG PO DR TAB
1000.0000 mg | DELAYED_RELEASE_TABLET | Freq: Every evening | ORAL | Status: DC
  Administered 2015-09-01: 1000 mg via ORAL
  Filled 2015-09-01 (×2): qty 2

## 2015-09-01 MED ORDER — ARTIFICIAL TEARS OP OINT
TOPICAL_OINTMENT | OPHTHALMIC | Status: AC
  Filled 2015-09-01: qty 3.5

## 2015-09-01 MED ORDER — SODIUM CHLORIDE 0.9 % IJ SOLN
INTRAMUSCULAR | Status: AC
  Filled 2015-09-01: qty 10

## 2015-09-01 MED ORDER — METHOCARBAMOL 750 MG PO TABS: 750.0000 mg | ORAL_TABLET | Freq: Four times a day (QID) | ORAL | Status: DC | PRN

## 2015-09-01 MED ORDER — LACTATED RINGERS IV SOLN
INTRAVENOUS | Status: DC | PRN
  Administered 2015-09-01 (×2): via INTRAVENOUS

## 2015-09-01 MED ORDER — PREGABALIN 75 MG PO CAPS: 150.0000 mg | ORAL_CAPSULE | Freq: Two times a day (BID) | ORAL | Status: DC

## 2015-09-01 MED ORDER — PROPOFOL 10 MG/ML IV BOLUS
INTRAVENOUS | Status: DC | PRN
  Administered 2015-09-01: 130 mg via INTRAVENOUS

## 2015-09-01 MED ORDER — VECURONIUM BROMIDE 10 MG IV SOLR
INTRAVENOUS | Status: DC | PRN
  Administered 2015-09-01: 3 mg via INTRAVENOUS
  Administered 2015-09-01: 2 mg via INTRAVENOUS

## 2015-09-01 MED ORDER — LIDOCAINE HCL (CARDIAC) 20 MG/ML IV SOLN
INTRAVENOUS | Status: DC | PRN
  Administered 2015-09-01: 50 mg via INTRAVENOUS

## 2015-09-01 MED ORDER — PANTOPRAZOLE SODIUM 40 MG IV SOLR: 40.0000 mg | Freq: Every day | INTRAVENOUS | Status: DC

## 2015-09-01 MED ORDER — BISACODYL 10 MG RE SUPP: 10.0000 mg | Freq: Every day | RECTAL | Status: DC | PRN

## 2015-09-01 MED ORDER — ONDANSETRON HCL 4 MG/2ML IJ SOLN
INTRAMUSCULAR | Status: DC | PRN
  Administered 2015-09-01: 4 mg via INTRAVENOUS

## 2015-09-01 MED ORDER — GLYCOPYRROLATE 0.2 MG/ML IJ SOLN
INTRAMUSCULAR | Status: DC | PRN
  Administered 2015-09-01: .6 mg via INTRAVENOUS
  Administered 2015-09-01: 0.2 mg via INTRAVENOUS

## 2015-09-01 MED ORDER — MIDAZOLAM HCL 2 MG/2ML IJ SOLN
INTRAMUSCULAR | Status: AC
  Filled 2015-09-01: qty 4

## 2015-09-01 MED ORDER — HYDROMORPHONE HCL 1 MG/ML IJ SOLN
0.5000 mg | INTRAMUSCULAR | Status: DC | PRN
  Administered 2015-09-01: 1 mg via INTRAVENOUS
  Filled 2015-09-01: qty 1

## 2015-09-01 MED ORDER — METHOCARBAMOL 500 MG PO TABS
500.0000 mg | ORAL_TABLET | Freq: Four times a day (QID) | ORAL | Status: DC | PRN
  Administered 2015-09-01: 500 mg via ORAL
  Filled 2015-09-01 (×2): qty 1

## 2015-09-01 MED ORDER — FLEET ENEMA 7-19 GM/118ML RE ENEM: 1.0000 | ENEMA | Freq: Once | RECTAL | Status: DC | PRN

## 2015-09-01 MED ORDER — SODIUM CHLORIDE 0.9 % IV SOLN: INTRAVENOUS | Status: DC

## 2015-09-01 MED ORDER — ONDANSETRON HCL 4 MG/2ML IJ SOLN
INTRAMUSCULAR | Status: AC
  Filled 2015-09-01: qty 2

## 2015-09-01 MED ORDER — BUPIVACAINE-EPINEPHRINE (PF) 0.5% -1:200000 IJ SOLN
INTRAMUSCULAR | Status: DC | PRN
  Administered 2015-09-01: 10 mL via PERINEURAL

## 2015-09-01 MED ORDER — CEFAZOLIN SODIUM-DEXTROSE 2-3 GM-% IV SOLR
2.0000 g | Freq: Three times a day (TID) | INTRAVENOUS | Status: AC
  Administered 2015-09-01 (×2): 2 g via INTRAVENOUS
  Filled 2015-09-01 (×2): qty 50

## 2015-09-01 MED ORDER — HYDROMORPHONE HCL 1 MG/ML IJ SOLN
INTRAMUSCULAR | Status: AC
  Filled 2015-09-01: qty 1

## 2015-09-01 MED ORDER — LIDOCAINE HCL (CARDIAC) 20 MG/ML IV SOLN
INTRAVENOUS | Status: AC
  Filled 2015-09-01: qty 5

## 2015-09-01 MED ORDER — DOCUSATE SODIUM 100 MG PO CAPS
100.0000 mg | ORAL_CAPSULE | Freq: Two times a day (BID) | ORAL | Status: DC
  Administered 2015-09-01 (×2): 100 mg via ORAL
  Filled 2015-09-01: qty 1

## 2015-09-01 MED ORDER — PANTOPRAZOLE SODIUM 40 MG PO TBEC
40.0000 mg | DELAYED_RELEASE_TABLET | Freq: Every day | ORAL | Status: DC
  Administered 2015-09-01: 40 mg via ORAL

## 2015-09-01 MED ORDER — OXYCODONE-ACETAMINOPHEN 5-325 MG PO TABS: 1.0000 | ORAL_TABLET | ORAL | Status: DC | PRN

## 2015-09-01 MED ORDER — PHENYLEPHRINE 40 MCG/ML (10ML) SYRINGE FOR IV PUSH (FOR BLOOD PRESSURE SUPPORT)
PREFILLED_SYRINGE | INTRAVENOUS | Status: AC
  Filled 2015-09-01: qty 20

## 2015-09-01 MED ORDER — PHENOL 1.4 % MT LIQD: 1.0000 | OROMUCOSAL | Status: DC | PRN

## 2015-09-01 MED ORDER — ACETAMINOPHEN 500 MG PO TABS: 500.0000 mg | ORAL_TABLET | Freq: Four times a day (QID) | ORAL | Status: DC | PRN

## 2015-09-01 MED ORDER — SODIUM CHLORIDE 0.9 % IV SOLN: 250.0000 mL | INTRAVENOUS | Status: DC

## 2015-09-01 MED ORDER — FENTANYL CITRATE (PF) 100 MCG/2ML IJ SOLN
INTRAMUSCULAR | Status: DC | PRN
  Administered 2015-09-01: 100 ug via INTRAVENOUS
  Administered 2015-09-01 (×3): 50 ug via INTRAVENOUS

## 2015-09-01 MED ORDER — VECURONIUM BROMIDE 10 MG IV SOLR
INTRAVENOUS | Status: AC
  Filled 2015-09-01: qty 10

## 2015-09-01 MED ORDER — MIDAZOLAM HCL 5 MG/5ML IJ SOLN
INTRAMUSCULAR | Status: DC | PRN
Start: 2015-09-01 — End: 2015-09-01
  Administered 2015-09-01: 2 mg via INTRAVENOUS

## 2015-09-01 MED ORDER — LURASIDONE HCL 20 MG PO TABS
30.0000 mg | ORAL_TABLET | Freq: Every evening | ORAL | Status: DC
  Administered 2015-09-01: 30 mg via ORAL
  Filled 2015-09-01 (×2): qty 2

## 2015-09-01 MED ORDER — OXYCODONE-ACETAMINOPHEN 5-325 MG PO TABS
1.0000 | ORAL_TABLET | ORAL | Status: DC | PRN
  Administered 2015-09-01 – 2015-09-02 (×2): 2 via ORAL
  Filled 2015-09-01 (×2): qty 2

## 2015-09-01 MED ORDER — SODIUM CHLORIDE 0.9 % IJ SOLN
3.0000 mL | Freq: Two times a day (BID) | INTRAMUSCULAR | Status: DC
  Administered 2015-09-01: 3 mL via INTRAVENOUS

## 2015-09-01 MED ORDER — METOPROLOL SUCCINATE ER 25 MG PO TB24
50.0000 mg | ORAL_TABLET | Freq: Every evening | ORAL | Status: DC
  Administered 2015-09-01: 50 mg via ORAL
  Filled 2015-09-01: qty 2

## 2015-09-01 MED ORDER — NEOSTIGMINE METHYLSULFATE 10 MG/10ML IV SOLN
INTRAVENOUS | Status: DC | PRN
  Administered 2015-09-01: 4 mg via INTRAVENOUS

## 2015-09-01 MED ORDER — HEMOSTATIC AGENTS (NO CHARGE) OPTIME
TOPICAL | Status: DC | PRN
  Administered 2015-09-01: 1 via TOPICAL

## 2015-09-01 MED ORDER — FENTANYL CITRATE (PF) 250 MCG/5ML IJ SOLN
INTRAMUSCULAR | Status: AC
  Filled 2015-09-01: qty 5

## 2015-09-01 MED ORDER — ROCURONIUM BROMIDE 100 MG/10ML IV SOLN
INTRAVENOUS | Status: DC | PRN
  Administered 2015-09-01: 50 mg via INTRAVENOUS

## 2015-09-01 MED ORDER — NEOSTIGMINE METHYLSULFATE 10 MG/10ML IV SOLN
INTRAVENOUS | Status: AC
  Filled 2015-09-01: qty 3

## 2015-09-01 MED ORDER — SODIUM CHLORIDE 0.9 % IR SOLN
Status: DC | PRN
  Administered 2015-09-01: 09:00:00

## 2015-09-01 MED ORDER — LIDOCAINE-EPINEPHRINE 2 %-1:100000 IJ SOLN
INTRAMUSCULAR | Status: DC | PRN
  Administered 2015-09-01: 10 mL via INTRADERMAL

## 2015-09-01 MED ORDER — POLYETHYLENE GLYCOL 3350 17 G PO PACK: 17.0000 g | PACK | Freq: Every day | ORAL | Status: DC | PRN

## 2015-09-01 MED ORDER — HYDROMORPHONE HCL 1 MG/ML IJ SOLN
0.2500 mg | INTRAMUSCULAR | Status: DC | PRN
  Administered 2015-09-01 (×2): 0.5 mg via INTRAVENOUS

## 2015-09-01 MED ORDER — GLYCOPYRROLATE 0.2 MG/ML IJ SOLN
INTRAMUSCULAR | Status: AC
  Filled 2015-09-01: qty 1

## 2015-09-01 MED ORDER — SODIUM CHLORIDE 0.9 % IJ SOLN: 3.0000 mL | INTRAMUSCULAR | Status: DC | PRN

## 2015-09-01 MED ORDER — THROMBIN 5000 UNITS EX SOLR
CUTANEOUS | Status: DC | PRN
  Administered 2015-09-01 (×2): 5000 [IU] via TOPICAL

## 2015-09-01 MED ORDER — METHOCARBAMOL 1000 MG/10ML IJ SOLN
500.0000 mg | Freq: Four times a day (QID) | INTRAVENOUS | Status: DC | PRN
  Administered 2015-09-01: 500 mg via INTRAVENOUS
  Filled 2015-09-01 (×2): qty 5

## 2015-09-01 MED ORDER — PHENYLEPHRINE HCL 10 MG/ML IJ SOLN
INTRAMUSCULAR | Status: DC | PRN
  Administered 2015-09-01 (×2): 80 ug via INTRAVENOUS
  Administered 2015-09-01: 120 ug via INTRAVENOUS
  Administered 2015-09-01 (×3): 80 ug via INTRAVENOUS

## 2015-09-01 MED ORDER — DEXAMETHASONE SODIUM PHOSPHATE 10 MG/ML IJ SOLN
INTRAMUSCULAR | Status: DC | PRN
  Administered 2015-09-01: 10 mg via INTRAVENOUS

## 2015-09-01 MED ORDER — PANTOPRAZOLE SODIUM 40 MG PO TBEC: 40.0000 mg | DELAYED_RELEASE_TABLET | Freq: Every day | ORAL | Status: DC

## 2015-09-01 MED ORDER — MENTHOL 3 MG MT LOZG: 1.0000 | LOZENGE | OROMUCOSAL | Status: DC | PRN

## 2015-09-01 MED ORDER — EPHEDRINE SULFATE 50 MG/ML IJ SOLN
INTRAMUSCULAR | Status: AC
  Filled 2015-09-01: qty 1

## 2015-09-01 MED ORDER — ACETAMINOPHEN 650 MG RE SUPP: 650.0000 mg | RECTAL | Status: DC | PRN

## 2015-09-01 MED ORDER — SULFAMETHOXAZOLE-TRIMETHOPRIM 800-160 MG PO TABS
1.0000 | ORAL_TABLET | Freq: Two times a day (BID) | ORAL | Status: DC
  Administered 2015-09-01: 1 via ORAL
  Filled 2015-09-01 (×3): qty 1

## 2015-09-01 MED ORDER — SENNA 8.6 MG PO TABS
1.0000 | ORAL_TABLET | Freq: Two times a day (BID) | ORAL | Status: DC
Start: 2015-09-01 — End: 2015-09-02
  Administered 2015-09-01 (×2): 8.6 mg via ORAL
  Filled 2015-09-01 (×2): qty 1

## 2015-09-01 MED ORDER — THROMBIN 5000 UNITS EX SOLR
OROMUCOSAL | Status: DC | PRN
  Administered 2015-09-01: 09:00:00 via TOPICAL

## 2015-09-01 SURGICAL SUPPLY — 62 items
ADH SKN CLS APL DERMABOND .7 (GAUZE/BANDAGES/DRESSINGS) ×1
APPLICATOR CHLORAPREP 3ML ORNG (MISCELLANEOUS) ×2 IMPLANT
BIT DRILL ARCHON 2.5X13MM DTS (DRILL) ×1 IMPLANT
BIT DRILL NEURO 2X3.1 SFT TUCH (MISCELLANEOUS) ×1 IMPLANT
BLADE SURG 11 STRL SS (BLADE) ×2 IMPLANT
BLADE ULTRA TIP 2M (BLADE) ×2 IMPLANT
BUR MATCHSTICK NEURO 3.0 LAGG (BURR) ×2 IMPLANT
CAGE COROENT LORD 8X11X14-5 (Cage) ×2 IMPLANT
CANISTER SUCT 3000ML PPV (MISCELLANEOUS) ×2 IMPLANT
DECANTER SPIKE VIAL GLASS SM (MISCELLANEOUS) ×2 IMPLANT
DERMABOND ADVANCED (GAUZE/BANDAGES/DRESSINGS) ×1
DERMABOND ADVANCED .7 DNX12 (GAUZE/BANDAGES/DRESSINGS) ×1 IMPLANT
DRAPE C-ARM 42X72 X-RAY (DRAPES) ×4 IMPLANT
DRAPE LAPAROTOMY 100X72 PEDS (DRAPES) ×2 IMPLANT
DRAPE MICROSCOPE LEICA (MISCELLANEOUS) ×2 IMPLANT
DRAPE POUCH INSTRU U-SHP 10X18 (DRAPES) ×2 IMPLANT
DRAPE PROXIMA HALF (DRAPES) IMPLANT
DRAPE SHEET LG 3/4 BI-LAMINATE (DRAPES) ×6 IMPLANT
DRILL ARCHON 2.5X13MM DTS (DRILL) ×2
DRILL NEURO 2X3.1 SOFT TOUCH (MISCELLANEOUS) ×2
ELECT COATED BLADE 2.86 ST (ELECTRODE) ×2 IMPLANT
ELECT REM PT RETURN 9FT ADLT (ELECTROSURGICAL) ×2
ELECTRODE REM PT RTRN 9FT ADLT (ELECTROSURGICAL) ×1 IMPLANT
EVACUATOR 1/8 PVC DRAIN (DRAIN) ×2 IMPLANT
GAUZE SPONGE 4X4 12PLY STRL (GAUZE/BANDAGES/DRESSINGS) IMPLANT
GAUZE SPONGE 4X4 16PLY XRAY LF (GAUZE/BANDAGES/DRESSINGS) IMPLANT
GLOVE BIOGEL PI IND STRL 7.5 (GLOVE) ×1 IMPLANT
GLOVE BIOGEL PI INDICATOR 7.5 (GLOVE) ×1
GLOVE ECLIPSE 6.5 STRL STRAW (GLOVE) ×2 IMPLANT
GLOVE ECLIPSE 7.5 STRL STRAW (GLOVE) ×6 IMPLANT
GLOVE EXAM NITRILE LRG STRL (GLOVE) IMPLANT
GLOVE INDICATOR 6.5 STRL GRN (GLOVE) ×2 IMPLANT
GLOVE INDICATOR 7.5 STRL GRN (GLOVE) ×4 IMPLANT
GLOVE INDICATOR 8.0 STRL GRN (GLOVE) ×2 IMPLANT
GOWN STRL REUS W/ TWL LRG LVL3 (GOWN DISPOSABLE) ×2 IMPLANT
GOWN STRL REUS W/ TWL XL LVL3 (GOWN DISPOSABLE) ×1 IMPLANT
GOWN STRL REUS W/TWL LRG LVL3 (GOWN DISPOSABLE) ×4
GOWN STRL REUS W/TWL XL LVL3 (GOWN DISPOSABLE) ×2
HEMOSTAT POWDER KIT SURGIFOAM (HEMOSTASIS) ×2 IMPLANT
KIT BASIN OR (CUSTOM PROCEDURE TRAY) ×2 IMPLANT
KIT ROOM TURNOVER OR (KITS) ×2 IMPLANT
NEEDLE HYPO 25X1 1.5 SAFETY (NEEDLE) ×2 IMPLANT
NS IRRIG 1000ML POUR BTL (IV SOLUTION) ×2 IMPLANT
PACK LAMINECTOMY NEURO (CUSTOM PROCEDURE TRAY) ×2 IMPLANT
PAD ARMBOARD 7.5X6 YLW CONV (MISCELLANEOUS) ×2 IMPLANT
PATTIES SURGICAL .5X1.5 (GAUZE/BANDAGES/DRESSINGS) ×2 IMPLANT
PIN DISTRACTION 14MM (PIN) ×4 IMPLANT
PLATE ARCHON 1-LEVEL 26MM (Plate) ×2 IMPLANT
PUTTY DBM PROPEL MEDIUM (Putty) ×2 IMPLANT
RUBBERBAND STERILE (MISCELLANEOUS) ×4 IMPLANT
SCREW ARCHON SELFTAP 4.0X13 (Screw) ×4 IMPLANT
SCREW ARCHON ST FIX 4.0X13MM (Screw) ×4 IMPLANT
SPONGE INTESTINAL PEANUT (DISPOSABLE) ×2 IMPLANT
SPONGE SURGIFOAM ABS GEL SZ50 (HEMOSTASIS) ×2 IMPLANT
STAPLER VISISTAT 35W (STAPLE) ×2 IMPLANT
STOCKINETTE 6  STRL (DRAPES) ×1
STOCKINETTE 6 STRL (DRAPES) ×1 IMPLANT
SUT VIC AB 3-0 SH 8-18 (SUTURE) ×4 IMPLANT
TOWEL OR 17X24 6PK STRL BLUE (TOWEL DISPOSABLE) ×4 IMPLANT
TOWEL OR 17X26 10 PK STRL BLUE (TOWEL DISPOSABLE) ×2 IMPLANT
TUBE CONNECTING 12X1/4 (SUCTIONS) IMPLANT
WATER STERILE IRR 1000ML POUR (IV SOLUTION) ×2 IMPLANT

## 2015-09-01 NOTE — Op Note (Signed)
Account #: none Patient Name: [name] [name], [name] [name] MRN: [id number] Sex: Female Date of Birth: 1970/12/02 Provider: Daun Peacock M.D.  Assistant: Kary Kos, MD  Date of Encounter: 09/01/2015     INDICATIONS: Neck and upper extremity pain consistent with cervical radiculopathy and refractory to medical management. A long discussion was held regarding the risks and benefits of the surgery as well as the risks and benefits of the alternatives to surgery and the patient wished to proceed.  PRE-OPERATIVE DIAGNOSES:  1. Cervical radiculopathy  Cervical Level(s): C5-C6 2. Spondylolisthesis  Cervical region  Vertebrae: C5, C6  POST-OPERATIVE DIAGNOSES:  1. Cervical radiculopathy  Cervical Level(s): C5-C6 2. Spondylolisthesis  Cervical region  Vertebrae: C5, C6  PROCEDURE PERFORMED:  1. Anterior cervical diskectomy and fusion (including discectomy, arthrodesis, and anterior instrumentation)  Cervical Level(s): C5-C6   Anterior Instrumentation: Nuvasive Archon   Biomechanical Device(s): PEEK spacer   Spinal Graft(s): Allograft, morselized   Imaging: Fluoroscopic Guidance   Type(s): None  FINDINGS: Cervical stenosis TECHNIQUE:  1. Following smooth induction of general endotracheal anesthesia, a bite block was placed in the patient's mouth and the chin was taped and secured to the bed to extend and very slightly rotate the neck. Bolsters were placed behind the neck for support. The area of the planned incision was wiped with alcohol and then prepped and draped in the usual sterile fashion.  After administration of IV antibiotic prophylaxis, the c-arm was used to identify the level of the disc space and a 3-4-cm incision was made over the anterior aspect of the patient's neck and sharp dissection was performed through the platysma. The medial structures of the neck were retracted medially and then the prevertebral fascia was swept away. The C-arm was used to identify the C5-C6  disc. Subperiosteal dissection was performed underneath the longus colli muscles of C5 and C6. The endotracheal tube cuff was partially deflated and blades from a retractor system were placed deep to the longus colli muscles.   Distraction pins were inserted parallel to the endplates and into the vertebral bodies adjacent to the C5-C6 disc. An annulotomy was performed and the disc space was distracted gently. A C5-C6 anterior discectomy was then performed using curettes, pituitary rongeurs and Kerrison rongeurs. Dissection was taken all the way back to the uncovertebral joints.  At this point, the high-speed burr was used to take down the uncovertebral joints bilaterally and decorticate the endplates of C5 and C6, respectively. Superior and inferior osteophytes were removed and the posterior longitudinal ligament was opened to further decompress the spinal canal. Ligament was resected laterally until the nerve roots were exposed bilaterally. The neural foramina were decompressed and this was confirmed by palpation with a nerve probe.   The interspace was then templated. The appropriate sized cage was chosen. It was filled with morsellized allograft and inserted into the C5 intervertebral defect, thus performing a C5-C6 anterior interbody fusion and insertion of biomechanical device.  A fixation plate was placed across the vertebral bodies of C5 and C6. Screws were placed, two in C5 and two C6. This was checked on both AP and lateral views and found to be in good alignment. The screws were in good position. The screws were tightened down and locked into position using the locking mechanism along the plate.   At this point, the wound was irrigated with copious amounts of bacitracin saline. Meticulous hemostasis was obtained. The platysma and subcutaneous layer was closed with interrupted Vicryl sutures and the skin was closed with dermabond.  The patient tolerated the procedure well.  DISPOSITION: The  patient tolerated the procedure well, and was transferred to recovery.  ANESTHESIA: General   COMPLICATIONS: None   ESTIMATED BLOOD LOSS: Per Anesthesia Record   SPECIMENS REMOVED: None   SPONGE, NEEDLE & INSTRUMENT COUNT: Correct X2   ASSISTANTS: Kary Kos, MD  PAST MEDICAL HISTORY: See Chart   ALLERGIES: No known allergies   SURGICAL HISTORY: See Chart   FAMILY HISTORY: See Chart   SOCIAL HISTORY: See Chart

## 2015-09-01 NOTE — H&P (Signed)
This 44 year old female presents for neck pain.  History of Present Illness: 1.  neck pain  [name] [name] returns to clinic today.  She has continued medical management and her symptoms have worsened.  She states her resting pain is worse in the left shoulder and arm but her pain with activities worsening right shoulder and arm.  She continues to have neck pain.  She was unable to participate with physical therapy because her insurance would not pay for it.  She is interested in surgery at this time.      Medical/Surgical/Interim History Reviewed, no change.  Last detailed document date:07/06/2015.   PAST MEDICAL HISTORY, SURGICAL HISTORY, FAMILY HISTORY, SOCIAL HISTORY AND REVIEW OF SYSTEMS I have reviewed the patient's past medical, surgical, family and social history as well as the comprehensive review of systems as included on the [facility] history form dated, which I have signed.  Family History: Reviewed, no changes.  Last detailed document: 07/06/2015.   Social History: Tobacco use reviewed. Reviewed, no changes. Last detailed document date: 07/06/2015.      MEDICATIONS(added, continued or stopped this visit): Started Medication Directions Instruction Stopped   Depakote 500 mg tablet,delayed release take 1 tablet by oral route  every evening     Latuda 20 mg tablet take 1 tablet by oral route  every day with food (at least 350 calories)     Lyrica 75 mg capsule take 1 capsule by oral route 4 times every day     metoprolol tartrate 50 mg tablet take 1 tablet by oral route  every day with meals     Prilosec 40 mg capsule,delayed release take 1 capsule by oral route  every day before a meal       ALLERGIES: Ingredient Reaction Medication Name Comment  IBUPROFEN Anaphylaxis    ASPIRIN Anaphylaxis     Reviewed, no changes.    Vitals Date Temp F BP Pulse Ht In Wt Lb BMI BSA Pain Score  08/27/2015  122/82 75 67 217 42.38       PHYSICAL  EXAM General Level of Distress: no acute distress Overall Appearance: normal    Musculoskeletal Gait and Station: normal  Right Left Upper Extremity Muscle Strength: normal normal Lower Extremity Muscle Strength: normal normal Upper Extremity Muscle Tone:  normal normal Lower Extremity Muscle Tone: normal normal  Motor Strength Upper and lower extremity motor strength was tested in the clinically pertinent muscles.     Deep Tendon Reflexes  Right Left Biceps: normal normal Triceps: normal normal Brachiloradialis: normal normal Patellar: normal normal Achilles: normal normal  Sensory Sensation was tested at C2 to T1 and L1 to S1.   Motor and other Tests    Right Left Clonus: normal normal     DIAGNOSTIC RESULTS No new imaging.    IMPRESSION [name] [name] is a 44 year old Caucasian woman with cervical spondylosis and radiculopathy.  Her symptoms are attributable to the C5 C6 level.  She is neurologically intact.  She has failed medical management at this point.  I have recommended a C5 C6 ACDF.We had a long discussion, in language she understands, about the details of the procedure and its risks and benefits.  We also discussed alternatives to the procedure and the risks and benefits of those.  She asked appropriate questions and wishes to proceed with surgery.  Completed Orders (this encounter) Order Details Reason Side Interpretation Result Initial Treatment Date Region  Dietary management education, guidance, and counseling Patient encouraged  to eat a well balanced diet.         Assessment/Plan # Detail Type Description   1. Assessment Cervical spondylosis with radiculopathy (M47.22).       2. Assessment Body mass index (BMI) 40.0-44.9, adult (Z68.41).   Plan Orders Today's instructions / counseling include(s) Dietary management education, guidance, and counseling.         Pain Assessment/Treatment Location: neck. Onset: 08/20/2014. Duration:  varies. Quality: discomforting.  Fall Risk Plan The patient has not fallen in the last year.  C5-C6 ACDF

## 2015-09-01 NOTE — Progress Notes (Signed)
Awake and alert Voice good Full strength Incision c/d/i and flat

## 2015-09-01 NOTE — Transfer of Care (Signed)
Immediate Anesthesia Transfer of Care Note  Patient: [name] [name]  Procedure(s) Performed: Procedure(s): ANTERIOR CERVICAL DECOMPRESSION/DISCECTOMY FUSION PLATING BONEGRAFT CERVICAL FIVE-SIX (N/A)  Patient Location: PACU  Anesthesia Type:General  Level of Consciousness: awake, alert  and oriented  Airway & Oxygen Therapy: Patient Spontanous Breathing and Patient connected to nasal cannula oxygen  Post-op Assessment: Report given to RN and Post -op Vital signs reviewed and stable  Post vital signs: Reviewed and stable  Last Vitals:  Filed Vitals:   09/01/15 1100  BP: 123/61  Pulse: 77  Temp: 37.3 C  Resp: 15    Complications: No apparent anesthesia complications

## 2015-09-01 NOTE — Progress Notes (Signed)
Utilization review completed.  

## 2015-09-01 NOTE — Anesthesia Postprocedure Evaluation (Signed)
  Anesthesia Post-op Note  Patient: [name] [name]  Procedure(s) Performed: Procedure(s): ANTERIOR CERVICAL DECOMPRESSION/DISCECTOMY FUSION PLATING BONEGRAFT CERVICAL FIVE-SIX (N/A)  Patient Location: PACU  Anesthesia Type:General  Level of Consciousness: awake and alert   Airway and Oxygen Therapy: Patient Spontanous Breathing  Post-op Pain: Controlled  Post-op Assessment: Post-op Vital signs reviewed, Patient's Cardiovascular Status Stable and Respiratory Function Stable  Post-op Vital Signs: Reviewed  Filed Vitals:   09/01/15 1200  BP: 112/57  Pulse: 62  Temp:   Resp: 8    Complications: No apparent anesthesia complications

## 2015-09-01 NOTE — Anesthesia Procedure Notes (Signed)
Procedure Name: Intubation Date/Time: 09/01/2015 8:44 AM Performed by: Mariea Clonts Pre-anesthesia Checklist: Patient identified, Emergency Drugs available, Suction available, Patient being monitored and Timeout performed Patient Re-evaluated:Patient Re-evaluated prior to inductionOxygen Delivery Method: Circle system utilized Preoxygenation: Pre-oxygenation with 100% oxygen Intubation Type: IV induction Ventilation: Mask ventilation without difficulty and Oral airway inserted - appropriate to patient size Laryngoscope Size: Sabra Heck and 2 Grade View: Grade I Tube type: Oral Tube size: 7.5 mm Number of attempts: 1 Placement Confirmation: ETT inserted through vocal cords under direct vision,  breath sounds checked- equal and bilateral and positive ETCO2 Tube secured with: Tape Dental Injury: Teeth and Oropharynx as per pre-operative assessment

## 2015-09-01 NOTE — Progress Notes (Signed)
No new problems Bilateral arm pain, left worse at rest, right worse with activity Motor 5/5 throughout BUE All questions answered Ready for sugery

## 2015-09-02 ENCOUNTER — Encounter (HOSPITAL_COMMUNITY): Payer: Self-pay | Admitting: Neurological Surgery

## 2015-09-02 NOTE — Evaluation (Signed)
Physical Therapy Evaluation and Discharge Patient Details Name: [name] [name] MRN: [id number] DOB: 03-12-1971 Today's Date: 09/02/2015   History of Present Illness  Pt is a 44 y/o female who presents s/p C5-6 ACDF on 09/01/15.  Clinical Impression  Patient evaluated by Physical Therapy with no further acute PT needs identified. All education has been completed and the patient has no further questions. At the time of PT eval pt was able to perform transfers and ambulation with no physical assistance. Pt describing symptoms of BPPV and feel she could benefit from a vestibular evaluation with outpatient physical therapy when appropriate per post-op protocol. See below for any follow-up Physial Therapy or equipment needs. PT is signing off. Thank you for this referral.      Follow Up Recommendations Outpatient PT;Supervision - Intermittent    Equipment Recommendations  None recommended by PT    Recommendations for Other Services       Precautions / Restrictions Precautions Precautions: Fall;Cervical Precaution Comments: Pt with history of vertigo on/off for >1 year Restrictions Weight Bearing Restrictions: No      Mobility  Bed Mobility Overal bed mobility: Needs Assistance Bed Mobility: Sidelying to Sit   Sidelying to sit: Supervision       General bed mobility comments: Pt sidelying when PT arrived. Able to transition to sitting position with supervision for safety.   Transfers Overall transfer level: Modified independent Equipment used: None             General transfer comment: No physical assist required.   Ambulation/Gait Ambulation/Gait assistance: Modified independent (Device/Increase time) Ambulation Distance (Feet): 450 Feet Assistive device: None Gait Pattern/deviations: WFL(Within Functional Limits) Gait velocity: Decreased Gait velocity interpretation: Below normal speed for age/gender General Gait Details: Pt steady with gait and reports no  dizziness. No LOB noted.   Stairs Stairs: Yes Stairs assistance: Supervision Stair Management: One rail Right Number of Stairs: 4 General stair comments: No assist required  Wheelchair Mobility    Modified Rankin (Stroke Patients Only)       Balance Overall balance assessment: No apparent balance deficits (not formally assessed)                                           Pertinent Vitals/Pain Pain Assessment: 0-10 Pain Score: 3  Pain Location: Neck Pain Descriptors / Indicators: Operative site guarding;Grimacing Pain Intervention(s): Limited activity within patient's tolerance;Monitored during session;Repositioned    Home Living Family/patient expects to be discharged to:: Private residence Living Arrangements: Parent Available Help at Discharge: Family;Available PRN/intermittently Type of Home: House Home Access: Stairs to enter Entrance Stairs-Rails: Right Entrance Stairs-Number of Steps: 4 Home Layout: One level Home Equipment: Shower seat;Cane - single point;Bedside commode Additional Comments: pt has shower seat and 3:1.  Pt can use them if she feels she needs them when home but does not have to use them.    Prior Function Level of Independence: Independent               Hand Dominance   Dominant Hand: Right    Extremity/Trunk Assessment   Upper Extremity Assessment: Generalized weakness           Lower Extremity Assessment: Overall WFL for tasks assessed      Cervical / Trunk Assessment: Other exceptions  Communication   Communication: No difficulties  Cognition Arousal/Alertness: Awake/alert Behavior During Therapy: WFL for tasks assessed/performed  Overall Cognitive Status: Within Functional Limits for tasks assessed                      General Comments      Exercises        Assessment/Plan    PT Assessment Patent does not need any further PT services  PT Diagnosis Acute pain   PT Problem List     PT Treatment Interventions     PT Goals (Current goals can be found in the Care Plan section) Acute Rehab PT Goals Patient Stated Goal: to go home PT Goal Formulation: All assessment and education complete, DC therapy    Frequency     Barriers to discharge        Co-evaluation               End of Session   Activity Tolerance: Patient tolerated treatment well Patient left: in chair;with call bell/phone within reach Nurse Communication: Mobility status         Time: 1224-8250 PT Time Calculation (min) (ACUTE ONLY): 24 min   Charges:   PT Evaluation $Initial PT Evaluation Tier I: 1 Procedure PT Treatments $Gait Training: 8-22 mins   PT G Codes:        Rolinda Roan 09-13-2015, 11:03 AM   Rolinda Roan, PT, DPT Acute Rehabilitation Services Pager: [phone]

## 2015-09-02 NOTE — Discharge Summary (Addendum)
Date of admission: 09/01/2015  Date of discharge: 09/02/2015  Admission diagnosis: Cervical radiculopathy, cervical stenosis C5-6  Discharge diagnosis: Same  Procedure performed: C5-6 ACDF  Hospital course: [name] [name] was admitted to the hospital on the morning surgery taken the operating room for the above listed procedure. She tolerated this well. On the morning of postoperative day 1 she was neurologically improved. She is discharged home at this time in stable medical neurological condition.  Discharge medications: She'll resume her prior medications, Percocet for pain, Robaxin for muscle spasms  Follow-up: 2 weeks

## 2015-09-02 NOTE — Progress Notes (Signed)
No acute events Feeling better AVSS Drain 0 Motor 5/5 Incision c/d/i and flat Stable/improved Ready for discharge

## 2015-09-02 NOTE — Evaluation (Signed)
Occupational Therapy Evaluation and Discharge Summary Patient Details Name: [name] [name] MRN: [id number] DOB: 1971/03/07 Today's Date: 09/02/2015    History of Present Illness Pt is a 44 y/o female who presents s/p C5-6 ACDF on 09/01/15.   Clinical Impression   Pt admitted with the above diagnosis and overall is doing very well with adls.  Educated pt on adl techniques post ACDF surgery.  Pt understands all precautions and techniques.  Pt has all necessary equipment at home.  No further acute OT needs.    Follow Up Recommendations  No OT follow up;Supervision - Intermittent    Equipment Recommendations  Other (comment) (pt has all needed equipment)    Recommendations for Other Services       Precautions / Restrictions Precautions Precautions: Fall;Cervical Precaution Comments: Pt with history of vertigo on/off for >1 year Restrictions Weight Bearing Restrictions: No      Mobility Bed Mobility Overal bed mobility: Needs Assistance             General bed mobility comments: Pt overally S to get to EOB.  Transfers Overall transfer level: Modified independent Equipment used: None             General transfer comment: Pt transferred sit to stand and walked in hallway with mod I.  Pt moved slowly but no physical assist needed.    Balance Overall balance assessment: No apparent balance deficits (not formally assessed)                                          ADL Overall ADL's : Modified independent                                       General ADL Comments: Pt can complete all basic adls without assist but moves very slow.  Pt educated re: precautions in regards to no overhead reaching etc and educated how to easily dress UE.  Pt has mother around at all times to assist if needed.  Pt needed no hands on assist with any adls.       Vision     Perception     Praxis      Pertinent Vitals/Pain Pain Assessment:  0-10 Pain Score: 3  Pain Location: Neck Pain Descriptors / Indicators: Operative site guarding;Grimacing Pain Intervention(s): Limited activity within patient's tolerance;Monitored during session;Repositioned     Hand Dominance Right   Extremity/Trunk Assessment Upper Extremity Assessment Upper Extremity Assessment: Generalized weakness   Lower Extremity Assessment Lower Extremity Assessment: Overall WFL for tasks assessed   Cervical / Trunk Assessment Cervical / Trunk Assessment: Other exceptions Cervical / Trunk Exceptions: Cervical incision   Communication Communication Communication: No difficulties   Cognition Arousal/Alertness: Awake/alert Behavior During Therapy: WFL for tasks assessed/performed Overall Cognitive Status: Within Functional Limits for tasks assessed                     General Comments       Exercises       Shoulder Instructions      Home Living Family/patient expects to be discharged to:: Private residence Living Arrangements: Parent Available Help at Discharge: Family;Available PRN/intermittently Type of Home: House Home Access: Stairs to enter [employer] of Steps: 4 Entrance Stairs-Rails: Right Home Layout: One level  Bathroom Shower/Tub: Advertising copywriter: Yes   Home Equipment: Shower seat;Cane - single point;Bedside commode   Additional Comments: pt has shower seat and 3:1.  Pt can use them if she feels she needs them when home but does not have to use them.      Prior Functioning/Environment Level of Independence: Independent             OT Diagnosis:     OT Problem List:     OT Treatment/Interventions:      OT Goals(Current goals can be found in the care plan section) Acute Rehab OT Goals Patient Stated Goal: to go home  OT Frequency:     Barriers to D/C:            Co-evaluation              End of Session Nurse Communication:  Mobility status  Activity Tolerance: Patient tolerated treatment well Patient left: in chair;with call bell/phone within reach;with family/visitor present   Time: 0955-1010 OT Time Calculation (min): 15 min Charges:  OT General Charges $OT Visit: 1 Procedure OT Evaluation $Initial OT Evaluation Tier I: 1 Procedure G-Codes:    Glenford Peers 04-Sep-2015, 10:26 AM  [phone]

## 2015-09-02 NOTE — Progress Notes (Signed)
Pt and mother given D/C instructions with Rx's, verbal understanding was provided. Pt's incision is clean and dry with no sign of infection. Pt's IV and Hemovac were removed prior to D/C. Pt D/C'd home via wheelchair @ 1150 per MD order. Pt is stable @ D/C and has no other needs at this time. Holli Humbles, RN

## 2015-09-08 ENCOUNTER — Encounter (HOSPITAL_COMMUNITY): Payer: Self-pay | Admitting: Neurological Surgery

## 2015-09-21 ENCOUNTER — Other Ambulatory Visit: Payer: Self-pay

## 2015-09-21 DIAGNOSIS — Z1231 Encounter for screening mammogram for malignant neoplasm of breast: Secondary | ICD-10-CM

## 2015-10-05 ENCOUNTER — Ambulatory Visit
Admission: RE | Admit: 2015-10-05 | Discharge: 2015-10-05 | Disposition: A | Discharge status: Home / Self Care | Payer: Medicaid Other | Source: Ambulatory Visit

## 2015-10-05 DIAGNOSIS — Z1231 Encounter for screening mammogram for malignant neoplasm of breast: Secondary | ICD-10-CM

## 2016-01-06 ENCOUNTER — Encounter: Payer: Self-pay | Admitting: Cardiology

## 2016-01-06 ENCOUNTER — Encounter: Payer: Self-pay | Admitting: Internal Medicine

## 2016-01-10 ENCOUNTER — Encounter: Payer: Self-pay | Admitting: Internal Medicine

## 2016-01-19 ENCOUNTER — Encounter: Payer: Self-pay | Admitting: Internal Medicine

## 2016-10-09 ENCOUNTER — Other Ambulatory Visit: Payer: Self-pay | Admitting: Family Medicine

## 2016-10-09 DIAGNOSIS — Z1231 Encounter for screening mammogram for malignant neoplasm of breast: Secondary | ICD-10-CM

## 2016-10-16 ENCOUNTER — Telehealth: Payer: Self-pay | Admitting: Cardiology

## 2016-10-16 NOTE — Telephone Encounter (Signed)
Received records from [facility] for appointment on 10/18/16 with Dr Ellyn Hack.  Records given to [facility] (medical records) for Dr Allison Quarry schedule on 10/18/16.

## 2016-10-18 ENCOUNTER — Ambulatory Visit (INDEPENDENT_AMBULATORY_CARE_PROVIDER_SITE_OTHER): Payer: Medicaid Other | Admitting: Cardiology

## 2016-10-18 ENCOUNTER — Encounter: Payer: Self-pay | Admitting: Cardiology

## 2016-10-18 VITALS — BP 132/91 | HR 84 | Ht 67.0 in | Wt 200.2 lb

## 2016-10-18 DIAGNOSIS — R079 Chest pain, unspecified: Secondary | ICD-10-CM | POA: Diagnosis not present

## 2016-10-18 DIAGNOSIS — R002 Palpitations: Secondary | ICD-10-CM

## 2016-10-18 DIAGNOSIS — R000 Tachycardia, unspecified: Secondary | ICD-10-CM | POA: Diagnosis not present

## 2016-10-18 NOTE — Progress Notes (Signed)
PCP: Sherilyn Cooter, NP  Clinic Note: Chief Complaint  Patient presents with  . New Patient (Initial Visit)  . Chest Pain    tightness; pressure  . Shortness of Breath    at resting or wiht chest pain  . Dizziness    at random and frequently    HPI: [name] [name] is a 45 y.o. female with a PMH below who presents today for cardiology evaluation for chest pain. She has a history of fibromyalgia with polyarthralgias and myalgias since 1997.  [name] [name] was last seen on November 17 recent onset chest pain. Description with the long-standing in her chest. There is no exacerbation with exertion.  Recent Hospitalizations: none  Studies Reviewed:   Myoview stress test March 2008: EF 68%. Normal contractility. Fair exercise capacity. Normal blood pressure response. Mild chest discomfort/dyspnea. No EKG changes. No sign of infarct versus anemia.  Interval History: [name] [name] presents today for evaluation of some chest discomfort that happened a few weeks ago. She did evaluated for similar symptoms several years ago.  She was actually given a prescription for Toprol to take for some palpitations in the past. Since starting that back, she really has not had any further symptoms. She said that she has been having intermittent spells of discomfort in her chest that have been happening mostly at rest nor she feels like is a heavy log sitting on her chest. It does not get worse with exertion, and has not happened at all with exertion. The worst episode was a few weekends ago when she had a very bad spell in the setting of a lot of family drama. She felt her heart going to aflutter associated with discomfort and felt very short of breath or she could not speak. It lasted about 5 minutes at the longest. The most significant symptom that she's had occasional episodes of the chest discomfort but more frequently has had some the lightheaded sensations or dizziness that are oftentimes associated with  lightheadedness and fluttering in her chest.  She has some exertional dyspnea, and notes that her left arm is been somewhat weak over the last several weeks.    No PND, orthopnea or edema. No syncope/near syncope, or TIA/amaurosis fugax symptoms. No melena, hematochezia, hematuria, or epstaxis. No claudication.  Down to smoking maybe 1 or 2 cigarettes a day and is hoping to finally be able to totally quit.  ROS: A comprehensive was performed. Review of Systems  Constitutional: Negative for chills, fever and malaise/fatigue.  HENT: Positive for congestion (From allergies).   Respiratory: Positive for shortness of breath (Some mild exertional dizziness).   Cardiovascular: Positive for chest pain and palpitations.       Per history of present illness  Gastrointestinal: Negative.   Genitourinary: Negative.   Musculoskeletal: Negative.   Skin: Negative.   Neurological: Positive for dizziness (Intermittent lightheadedness and dizziness - often when she feels her heart fluttering.).  Endo/Heme/Allergies: Positive for environmental allergies.  Psychiatric/Behavioral: Negative for depression and memory loss. The patient is nervous/anxious. The patient does not have insomnia.        Lots of social stress with family drama.  All other systems reviewed and are negative.   Past Medical History:  Diagnosis Date  . Anxiety   . Depression   . Dysrhythmia    sinus tachycardia- on toprol  . Fibromyalgia   . GERD (gastroesophageal reflux disease)   . Hyperlipidemia    no meds - diet  controlled  . Kidney stone   .  Migraines   . Osteoarthritis    no med  . PCOS (polycystic ovarian syndrome)   . Sleep apnea    sleep study done 8 yrs ago  never determined,told it needed to be repeated but was never done  . Suicidal overdose ([place])   . SVD (spontaneous vaginal delivery)    x 2  . Tachycardia    occasional - tx with toprol xl prn -     History of polysubstance abuse with cocaine positive  in 2014  History of sleep study in the past and was told she has sleep apnea but does not use CPAP.  Past Surgical History:  Procedure Laterality Date  . ANTERIOR CERVICAL DECOMP/DISCECTOMY FUSION N/A 09/01/2015   Procedure: ANTERIOR CERVICAL DECOMPRESSION/DISCECTOMY FUSION PLATING BONEGRAFT CERVICAL FIVE-SIX;  Surgeon: Kevan Ny Ditty, MD;  Location: [place] [facility];  Service: Neurosurgery;  Laterality: N/A;  . BREAST SURGERY     left for a papiloma: had a lump removed right breast-benign  . CHOLECYSTECTOMY N/A 03/13/2013   Procedure: LAPAROSCOPIC CHOLECYSTECTOMY;  Surgeon: Harl Bowie, MD;  Location: [place];  Service: General;  Laterality: N/A;  . COLONOSCOPY     x 2  . LAPAROSCOPY     x3 two for removal of scar tissue/adhesions  . LAPAROSCOPY  07/30/2012   Procedure: LAPAROSCOPY DIAGNOSTIC;  Surgeon: Logan Bores, MD;  Location: [place] [facility];  Service: Gynecology;  Laterality: N/A;  . NM MYOVIEW LTD  01/2007   EF 68%. Normal contractility. Fair exercise capacity. Mild discomfort and dyspnea with exercise but no EKG changes. No sign of ischemia or infarct. LOW RISK  . OVARIAN CYST REMOVAL    . thumb surgery Left    almost completely cut off with butcher knife  . WISDOM TOOTH EXTRACTION     Current Meds  Medication Sig  . acetaminophen (TYLENOL) 500 MG tablet Take 500 mg by mouth every 6 (six) hours as needed (pain).  . benztropine (COGENTIN) 0.5 MG tablet Take 0.5 mg by mouth 2 (two) times daily.  Marland Kitchen buPROPion (WELLBUTRIN SR) 150 MG 12 hr tablet Take 150 mg by mouth 2 (two) times daily.  . hydrOXYzine (ATARAX/VISTARIL) 25 MG tablet Take 25 mg by mouth 3 (three) times daily as needed.  . lamoTRIgine (LAMICTAL) 100 MG tablet Take 100 mg by mouth 2 (two) times daily.  . methocarbamol (ROBAXIN) 750 MG tablet Take 1 tablet (750 mg total) by mouth every 6 (six) hours as needed for muscle spasms.  . metoprolol succinate (TOPROL-XL) 50 MG 24 hr tablet Take 50 mg by  mouth every evening. Take with or immediately following a meal.  . omeprazole (PRILOSEC) 40 MG capsule Take 40 mg by mouth daily.  . pregabalin (LYRICA) 150 MG capsule Take 150-300 mg by mouth 2 (two) times daily. 300mg  in the morning and 150mg  in the evening  . sulfamethoxazole-trimethoprim (BACTRIM DS,SEPTRA DS) 800-160 MG tablet Take 1 tablet by mouth 2 (two) times daily.  -- Started back on Toprol since she had the episode of chest discomfort   Allergies  Allergen Reactions  . Aspirin Other (See Comments)    Angiodema  . Contrast Media [Iodinated Diagnostic Agents] Anaphylaxis and Swelling    Facial swelling, throat swelling  . Ibuprofen Other (See Comments)    Angioedema  . Nsaids Anaphylaxis    Swelling of eyes mouth and throat difficulty breathing    Social History   Social History  . Marital status: Divorced    Spouse name:  N/A  . Number of children: N/A  . Years of education: N/A   Occupational History  . Unemployed    Social History Main Topics  . Smoking status: Current Some Day Smoker    Packs/day: 0.25    Years: 15.00    Types: Cigarettes    Last attempt to quit: 09/26/2010  . Smokeless tobacco: Never Used  . Alcohol use Yes     Comment: 3-4x's wkly; drinks 2 sparks daily   . Drug use:     Types: Cocaine     Comment: occasional   last uesed over 2 years ago  . Sexual activity: Not Currently    Partners: Male    Birth control/ protection: None   Other Topics Concern  . None   Social History Narrative   She lives at home with her spouse. She does not exercise.   She currently denies recent recreational drug use.   Family History  Problem Relation Age of Onset  . Diabetes Father   . Hypertension Father   . Diabetes Mother   . Hypertension Mother   . Asthma Paternal Grandmother   . Kidney disease Paternal Grandmother   . Emphysema    . Tongue cancer    . Coronary artery disease       Wt Readings from Last 3 Encounters:  10/18/16 90.8 kg (200  lb 3.2 oz)  08/31/15 99.1 kg (218 lb 6.4 oz)  08/20/13 93.8 kg (206 lb 12.8 oz)    PHYSICAL EXAM BP (!) 132/91   Pulse 84   Ht 5\' 7"  (1.702 m)   Wt 90.8 kg (200 lb 3.2 oz)   BMI 31.36 kg/m  General appearance: alert, cooperative, appears stated age, no distress and Mildly obese. Otherwise well-nourished and well-groomed. Neck: no adenopathy, no carotid bruit and no JVD Lungs: clear to auscultation bilaterally, normal percussion bilaterally and non-labored Heart: regular rate and rhythm with intermittent ectopy, S1& S2 normal, no murmur, click, rub or gallop; nondisplaced PMI Abdomen: soft, non-tender; bowel sounds normal; no masses,  no organomegaly;  Extremities: extremities normal, atraumatic, no cyanosis, or edema Pulses: 2+ and symmetric; Skin: mobility and turgor normal, no evidence of bleeding or bruising and no lesions noted  Neurologic: Mental status: Alert, oriented, thought content appropriate Cranial nerves: normal (II-XII grossly intact)    Adult ECG Report  Rate: 84 ;  Rhythm: normal sinus rhythm and Normal axis, intervals and durations.; normal QT interval  Narrative Interpretation: Normal EKG   Other studies Reviewed: Additional studies/ records that were reviewed today include:  Recent Labs:  None available - TSH and chemistry panel ordered At [facility] / PLAN: Plan is ordered GXT to assess chest pain. Probably noncardiac. If GXT is normal, can follow-up with PCP. I would continue beta blocker for her palpitations/sinus tachycardia. If symptoms recur on beta blocker, we can order a monitor.  Problem List Items Addressed This Visit    Chest pain with low risk for cardiac etiology - Primary    I really don't think that these chest episodes are related to fixed coronary artery disease since they occur at rest and not with exertion. Probably related to social stress. We talked about the fact that the symptoms actually improved since starting  on metoprolol.  Since she does have cardiac risk factors and her family, we'll evaluate for ischemia with a GXT (Graded Exercise Tolerance Test); this will also help [place] see if there is any increase in her PACs  or PVCs with exertion.      Relevant Orders   EKG 12-Lead (Completed)   EXERCISE TOLERANCE TEST   PALPITATIONS    She has palpitation spells of intermittent dizziness. Gotten a whole lot better with metoprolol. As her not happening very frequently, I'm not sure for capture them on a monitor. If they do recur, would like to have wear a monitor. But for now we will hold off unless there is a recurrence.      Relevant Orders   EKG 12-Lead (Completed)   EXERCISE TOLERANCE TEST   Sinus tachycardia    She does have a history of sinus tachycardia or inappropriate sinus tachycardia. This is probably what she is noticing with her palpitations and dizziness. Seems to be better controlled being back on beta blocker. I would continue standing dose for now.         Current medicines are reviewed at length with the patient today. (+/- concerns) None The following changes have been made: Agree with continuing metoprolol  Patient Instructions  SCHEDULE AT [place] has requested that you have an exercise tolerance test. For further information please visit [email or web address]. Please also follow instruction sheet, as given.  Please hold METOPROLOL THE DAY BEFORE AND DAY OF EXERCISE TOLERANCE TEST.   IF TEST IS NORMAL , APPOINTMENT AS NEEDED, IF ABNORMAL FOLLOW UP APPOINTMENT IN 1 MONTH WITH DR Zamariya Neal.    Studies Ordered:   Orders Placed This Encounter  Procedures  . EXERCISE TOLERANCE TEST  . EKG 12-Lead      Glenetta Hew, M.D., M.S. Interventional Cardiologist   Pager # [phone] Phone # [phone] [address]. [place], [place] [address]

## 2016-10-18 NOTE — Patient Instructions (Signed)
SCHEDULE AT [place] Your physician has requested that you have an exercise tolerance test. For further information please visit [email or web address]. Please also follow instruction sheet, as given.  Please hold METOPROLOL THE DAY BEFORE AND DAY OF EXERCISE TOLERANCE TEST.   IF TEST IS NORMAL , APPOINTMENT AS NEEDED, IF ABNORMAL FOLLOW UP APPOINTMENT IN 1 MONTH WITH DR HARDING.

## 2016-10-19 ENCOUNTER — Encounter: Payer: Self-pay | Admitting: Cardiology

## 2016-10-19 NOTE — Assessment & Plan Note (Signed)
She does have a history of sinus tachycardia or inappropriate sinus tachycardia. This is probably what she is noticing with her palpitations and dizziness. Seems to be better controlled being back on beta blocker. I would continue standing dose for now.

## 2016-10-19 NOTE — Assessment & Plan Note (Signed)
She has palpitation spells of intermittent dizziness. Gotten a whole lot better with metoprolol. As her not happening very frequently, I'm not sure for capture them on a monitor. If they do recur, would like to have wear a monitor. But for now we will hold off unless there is a recurrence.

## 2016-10-19 NOTE — Assessment & Plan Note (Addendum)
I really don't think that these chest episodes are related to fixed coronary artery disease since they occur at rest and not with exertion. Probably related to social stress. We talked about the fact that the symptoms actually improved since starting on metoprolol.  Since she does have cardiac risk factors and her family, we'll evaluate for ischemia with a GXT (Graded Exercise Tolerance Test); this will also help [place] see if there is any increase in her PACs or PVCs with exertion.

## 2016-10-20 ENCOUNTER — Telehealth (HOSPITAL_COMMUNITY): Payer: Self-pay

## 2016-10-20 NOTE — Telephone Encounter (Signed)
Encounter complete. 

## 2016-10-24 ENCOUNTER — Ambulatory Visit (HOSPITAL_COMMUNITY)
Admission: RE | Admit: 2016-10-24 | Discharge: 2016-10-24 | Disposition: A | Discharge status: Home / Self Care | Payer: Medicaid Other | Source: Ambulatory Visit | Attending: Cardiology | Admitting: Cardiology

## 2016-10-24 DIAGNOSIS — R002 Palpitations: Secondary | ICD-10-CM | POA: Diagnosis not present

## 2016-10-24 DIAGNOSIS — R079 Chest pain, unspecified: Secondary | ICD-10-CM

## 2016-10-24 LAB — EXERCISE TOLERANCE TEST
CSEPEW: 10.7 METS
CSEPPHR: 151 {beats}/min
Exercise duration (min): 9 min
Exercise duration (sec): 30 s
Percent HR: 85 %

## 2016-10-30 ENCOUNTER — Ambulatory Visit: Payer: Self-pay

## 2016-11-20 DIAGNOSIS — J189 Pneumonia, unspecified organism: Secondary | ICD-10-CM

## 2016-11-20 HISTORY — DX: Pneumonia, unspecified organism: J18.9

## 2016-11-21 ENCOUNTER — Ambulatory Visit: Payer: Self-pay

## 2016-12-07 ENCOUNTER — Ambulatory Visit: Payer: Self-pay | Admitting: Cardiology

## 2016-12-26 ENCOUNTER — Ambulatory Visit
Admission: RE | Admit: 2016-12-26 | Discharge: 2016-12-26 | Disposition: A | Discharge status: Home / Self Care | Payer: Medicaid Other | Source: Ambulatory Visit | Attending: Family Medicine | Admitting: Family Medicine

## 2016-12-26 DIAGNOSIS — Z1231 Encounter for screening mammogram for malignant neoplasm of breast: Secondary | ICD-10-CM

## 2016-12-28 ENCOUNTER — Other Ambulatory Visit: Payer: Self-pay | Admitting: Neurological Surgery

## 2016-12-28 DIAGNOSIS — M4722 Other spondylosis with radiculopathy, cervical region: Secondary | ICD-10-CM

## 2016-12-29 ENCOUNTER — Other Ambulatory Visit: Payer: Self-pay

## 2016-12-29 ENCOUNTER — Other Ambulatory Visit: Payer: Self-pay | Admitting: Family Medicine

## 2016-12-29 DIAGNOSIS — R51 Headache: Secondary | ICD-10-CM

## 2016-12-29 DIAGNOSIS — R42 Dizziness and giddiness: Secondary | ICD-10-CM

## 2016-12-29 DIAGNOSIS — R110 Nausea: Secondary | ICD-10-CM

## 2016-12-29 DIAGNOSIS — G8929 Other chronic pain: Secondary | ICD-10-CM

## 2016-12-29 DIAGNOSIS — R519 Headache, unspecified: Secondary | ICD-10-CM

## 2017-01-01 ENCOUNTER — Inpatient Hospital Stay: Admission: RE | Admit: 2017-01-01 | Payer: Self-pay | Source: Ambulatory Visit

## 2017-01-05 ENCOUNTER — Ambulatory Visit
Admission: RE | Admit: 2017-01-05 | Discharge: 2017-01-05 | Disposition: A | Discharge status: Home / Self Care | Payer: Medicaid Other | Source: Ambulatory Visit | Attending: Neurological Surgery | Admitting: Neurological Surgery

## 2017-01-05 DIAGNOSIS — M4722 Other spondylosis with radiculopathy, cervical region: Secondary | ICD-10-CM

## 2017-01-05 MED ORDER — GADOBENATE DIMEGLUMINE 529 MG/ML IV SOLN
19.0000 mL | Freq: Once | INTRAVENOUS | Status: AC | PRN
  Administered 2017-01-05: 19 mL via INTRAVENOUS

## 2017-04-11 ENCOUNTER — Encounter (HOSPITAL_BASED_OUTPATIENT_CLINIC_OR_DEPARTMENT_OTHER): Payer: Self-pay

## 2017-04-11 DIAGNOSIS — R0683 Snoring: Secondary | ICD-10-CM

## 2017-04-11 DIAGNOSIS — R5383 Other fatigue: Secondary | ICD-10-CM

## 2017-04-12 ENCOUNTER — Other Ambulatory Visit: Payer: Self-pay | Admitting: Gastroenterology

## 2017-04-12 DIAGNOSIS — R1310 Dysphagia, unspecified: Secondary | ICD-10-CM

## 2017-04-20 ENCOUNTER — Other Ambulatory Visit: Payer: Self-pay | Admitting: Urology

## 2017-04-20 ENCOUNTER — Encounter (HOSPITAL_COMMUNITY): Payer: Self-pay | Admitting: *Deleted

## 2017-04-23 ENCOUNTER — Ambulatory Visit (HOSPITAL_COMMUNITY): Payer: Medicaid Other

## 2017-04-23 ENCOUNTER — Ambulatory Visit (HOSPITAL_COMMUNITY)
Admission: RE | Admit: 2017-04-23 | Discharge: 2017-04-23 | Disposition: A | Discharge status: Home / Self Care | Payer: Medicaid Other | Source: Ambulatory Visit | Attending: Urology | Admitting: Urology

## 2017-04-23 ENCOUNTER — Encounter (HOSPITAL_COMMUNITY)
Admission: RE | Disposition: A | Discharge status: Home / Self Care | Payer: Self-pay | Source: Ambulatory Visit | Attending: Urology

## 2017-04-23 ENCOUNTER — Encounter (HOSPITAL_COMMUNITY): Payer: Self-pay | Admitting: *Deleted

## 2017-04-23 DIAGNOSIS — N201 Calculus of ureter: Secondary | ICD-10-CM | POA: Insufficient documentation

## 2017-04-23 DIAGNOSIS — E785 Hyperlipidemia, unspecified: Secondary | ICD-10-CM | POA: Diagnosis not present

## 2017-04-23 DIAGNOSIS — R000 Tachycardia, unspecified: Secondary | ICD-10-CM | POA: Insufficient documentation

## 2017-04-23 DIAGNOSIS — K219 Gastro-esophageal reflux disease without esophagitis: Secondary | ICD-10-CM | POA: Diagnosis not present

## 2017-04-23 DIAGNOSIS — M797 Fibromyalgia: Secondary | ICD-10-CM | POA: Insufficient documentation

## 2017-04-23 DIAGNOSIS — G4730 Sleep apnea, unspecified: Secondary | ICD-10-CM | POA: Insufficient documentation

## 2017-04-23 DIAGNOSIS — F17210 Nicotine dependence, cigarettes, uncomplicated: Secondary | ICD-10-CM | POA: Insufficient documentation

## 2017-04-23 DIAGNOSIS — F329 Major depressive disorder, single episode, unspecified: Secondary | ICD-10-CM | POA: Diagnosis not present

## 2017-04-23 DIAGNOSIS — E282 Polycystic ovarian syndrome: Secondary | ICD-10-CM | POA: Diagnosis not present

## 2017-04-23 DIAGNOSIS — M1990 Unspecified osteoarthritis, unspecified site: Secondary | ICD-10-CM | POA: Diagnosis not present

## 2017-04-23 DIAGNOSIS — Z87442 Personal history of urinary calculi: Secondary | ICD-10-CM | POA: Diagnosis not present

## 2017-04-23 DIAGNOSIS — I499 Cardiac arrhythmia, unspecified: Secondary | ICD-10-CM | POA: Insufficient documentation

## 2017-04-23 DIAGNOSIS — F419 Anxiety disorder, unspecified: Secondary | ICD-10-CM | POA: Insufficient documentation

## 2017-04-23 HISTORY — PX: EXTRACORPOREAL SHOCK WAVE LITHOTRIPSY: SHX1557

## 2017-04-23 LAB — HCG, SERUM, QUALITATIVE: Preg, Serum: NEGATIVE

## 2017-04-23 SURGERY — LITHOTRIPSY, ESWL
Anesthesia: LOCAL | Laterality: Right

## 2017-04-23 MED ORDER — SODIUM CHLORIDE 0.9 % IV SOLN
INTRAVENOUS | Status: DC
  Administered 2017-04-23 (×2): via INTRAVENOUS

## 2017-04-23 MED ORDER — OXYCODONE-ACETAMINOPHEN 5-325 MG PO TABS
1.0000 | ORAL_TABLET | Freq: Once | ORAL | Status: AC
  Administered 2017-04-23: 2 via ORAL
  Filled 2017-04-23: qty 2

## 2017-04-23 MED ORDER — HYDROMORPHONE HCL 1 MG/ML IJ SOLN
0.5000 mg | Freq: Once | INTRAMUSCULAR | Status: AC
Start: 2017-04-23 — End: 2017-04-23
  Administered 2017-04-23: 0.5 mg via INTRAVENOUS
  Filled 2017-04-23: qty 1

## 2017-04-23 MED ORDER — DIPHENHYDRAMINE HCL 25 MG PO CAPS
25.0000 mg | ORAL_CAPSULE | ORAL | Status: AC
  Administered 2017-04-23: 25 mg via ORAL
  Filled 2017-04-23: qty 1

## 2017-04-23 MED ORDER — DIAZEPAM 5 MG PO TABS
10.0000 mg | ORAL_TABLET | ORAL | Status: AC
  Administered 2017-04-23: 10 mg via ORAL
  Filled 2017-04-23: qty 2

## 2017-04-23 MED ORDER — HYDROMORPHONE HCL 1 MG/ML IJ SOLN: 0.5000 mg | Freq: Once | INTRAMUSCULAR | Status: DC

## 2017-04-23 MED ORDER — CIPROFLOXACIN HCL 500 MG PO TABS
500.0000 mg | ORAL_TABLET | ORAL | Status: AC
  Administered 2017-04-23: 500 mg via ORAL
  Filled 2017-04-23: qty 1

## 2017-04-23 NOTE — H&P (Signed)
[name] [name] is an 46 y.o. female.    Chief Complaint: Pre-op RIGHT shockwave lithotripsy  HPI:   1 - RIGHT Large Ureteral Stone - 72mm Rt ureteral stone at L3-L4 by CT on eval right flank pain by Dr. Matilde Sprang. Despite large stone size she has elected shockwave lithotripsy. Most recent UA without infectious parameters. No interval fevers.  Today "[name] [name]" is seen to proceed with RIGHT shockwave lithotripsy.    Past Medical History:  Diagnosis Date  . Anxiety   . Depression   . Dysrhythmia    sinus tachycardia- on toprol  . Fibromyalgia   . GERD (gastroesophageal reflux disease)   . Hyperlipidemia    no meds - diet  controlled  . Kidney stone   . Migraines   . Osteoarthritis    no med  . PCOS (polycystic ovarian syndrome)   . Sleep apnea    sleep study done 8 yrs ago  never determined,told it needed to be repeated but was never done  . Suicidal overdose ([place])   . SVD (spontaneous vaginal delivery)    x 2  . Tachycardia    occasional - tx with toprol xl prn -     Past Surgical History:  Procedure Laterality Date  . ANTERIOR CERVICAL DECOMP/DISCECTOMY FUSION N/A 09/01/2015   Procedure: ANTERIOR CERVICAL DECOMPRESSION/DISCECTOMY FUSION PLATING BONEGRAFT CERVICAL FIVE-SIX;  Surgeon: Kevan Ny Ditty, MD;  Location: [place] [facility];  Service: Neurosurgery;  Laterality: N/A;  . BREAST EXCISIONAL BIOPSY Left 10/15/2002  . BREAST EXCISIONAL BIOPSY Right 1997  . BREAST EXCISIONAL BIOPSY Left 10/15/2002  . BREAST EXCISIONAL BIOPSY Right 1997  . BREAST SURGERY     left for a papiloma: had a lump removed right breast-benign  . CHOLECYSTECTOMY N/A 03/13/2013   Procedure: LAPAROSCOPIC CHOLECYSTECTOMY;  Surgeon: Harl Bowie, MD;  Location: [facility];  Service: General;  Laterality: N/A;  . COLONOSCOPY     x 2  . LAPAROSCOPY     x3 two for removal of scar tissue/adhesions  . LAPAROSCOPY  07/30/2012   Procedure: LAPAROSCOPY DIAGNOSTIC;  Surgeon: Logan Bores, MD;  Location: [facility];  Service: Gynecology;  Laterality: N/A;  . NM MYOVIEW LTD  01/2007   EF 68%. Normal contractility. Fair exercise capacity. Mild discomfort and dyspnea with exercise but no EKG changes. No sign of ischemia or infarct. LOW RISK  . OVARIAN CYST REMOVAL    . thumb surgery Left    almost completely cut off with butcher knife  . WISDOM TOOTH EXTRACTION      Family History  Problem Relation Age of Onset  . Diabetes Father   . Hypertension Father   . Diabetes Mother   . Hypertension Mother   . Asthma Paternal Grandmother   . Kidney disease Paternal Grandmother   . Emphysema Unknown   . Tongue cancer Unknown   . Coronary artery disease Unknown    Social History:  reports that she has been smoking Cigarettes.  She has a 6.25 pack-year smoking history. She has never used smokeless tobacco. She reports that she drinks alcohol. She reports that she uses drugs, including Cocaine.  Allergies:  Allergies  Allergen Reactions  . Aspirin Other (See Comments)    Angiodema  . Contrast Media [Iodinated Diagnostic Agents] Anaphylaxis and Swelling    Facial swelling, throat swelling  . Ibuprofen Other (See Comments)    Angioedema  . Nsaids Anaphylaxis    Swelling of eyes mouth and throat difficulty breathing  Medications Prior to Admission  Medication Sig Dispense Refill  . lurasidone (LATUDA) 40 MG TABS tablet Take 20 mg by mouth every evening.    Marland Kitchen acetaminophen (TYLENOL) 500 MG tablet Take 500 mg by mouth every 6 (six) hours as needed (pain).    . benztropine (COGENTIN) 0.5 MG tablet Take 0.5 mg by mouth 2 (two) times daily.    Marland Kitchen buPROPion (WELLBUTRIN SR) 150 MG 12 hr tablet Take 150 mg by mouth 2 (two) times daily.    . hydrOXYzine (ATARAX/VISTARIL) 25 MG tablet Take 25 mg by mouth 3 (three) times daily as needed.    . lamoTRIgine (LAMICTAL) 100 MG tablet Take 100 mg by mouth 2 (two) times daily.    . methocarbamol (ROBAXIN) 750 MG tablet Take 1 tablet  (750 mg total) by mouth every 6 (six) hours as needed for muscle spasms. 120 tablet 2  . metoprolol succinate (TOPROL-XL) 50 MG 24 hr tablet Take 50 mg by mouth 2 (two) times daily. Take with or immediately following a meal.     . omeprazole (PRILOSEC) 40 MG capsule Take 40 mg by mouth daily.    . pregabalin (LYRICA) 150 MG capsule Take 150-300 mg by mouth 2 (two) times daily. 300mg  in the morning and 150mg  in the evening    . sulfamethoxazole-trimethoprim (BACTRIM DS,SEPTRA DS) 800-160 MG tablet Take 1 tablet by mouth 2 (two) times daily.      No results found for this or any previous visit (from the past 48 hour(s)). No results found.  Review of Systems  Constitutional: Negative.  Negative for fever.  HENT: Negative.   Eyes: Negative.   Respiratory: Negative.   Cardiovascular: Negative.   Gastrointestinal: Negative.   Genitourinary: Negative.   Musculoskeletal: Negative.   Skin: Negative.   Neurological: Negative.   Endo/Heme/Allergies: Negative.   Psychiatric/Behavioral: Negative.     Blood pressure 125/86, pulse 71, temperature 98.7 F (37.1 C), temperature source Oral, resp. rate 16, height 5\' 7"  (1.702 m), weight 99.8 kg (220 lb), last menstrual period 04/04/2017, SpO2 95 %. Physical Exam  Constitutional: She appears well-developed.  HENT:  Head: Normocephalic.  Eyes: Pupils are equal, round, and reactive to light.  Neck: Normal range of motion.  Cardiovascular: Normal rate.   Respiratory: Effort normal.  GI: Soft.  Significant truncal obesity  Genitourinary:  Genitourinary Comments: Minimal Rt CVAT at present  Musculoskeletal: Normal range of motion.  Neurological: She is alert.  Skin: Skin is warm.  Psychiatric: She has a normal mood and affect.     Assessment/Plan  1 - RIGHT Large Ureteral Stone - proceed as planned with RIGHT SWL today. Risks, benefits, alternatives, expected peri-op course discssd previousliy and reiterated today.   Alexis Frock,  MD 04/23/2017, 6:21 AM

## 2017-04-23 NOTE — Brief Op Note (Signed)
04/23/2017  9:09 AM  PATIENT:  Leafy Half  46 y.o. female  PRE-OPERATIVE DIAGNOSIS:  RIGHT URETERAL STONE  POST-OPERATIVE DIAGNOSIS:  * No post-op diagnosis entered *  PROCEDURE:  Procedure(s): RIGHT EXTRACORPOREAL SHOCK WAVE LITHOTRIPSY (ESWL) (Right)  SURGEON:  Surgeon(s) and Role:    * Alexis Frock, MD - Primary  PHYSICIAN ASSISTANT:   ASSISTANTS: none   ANESTHESIA:   MAC  EBL:  No intake/output data recorded.  BLOOD ADMINISTERED:none  DRAINS: none   LOCAL MEDICATIONS USED:  NONE  SPECIMEN:  No Specimen  DISPOSITION OF SPECIMEN:  N/A  COUNTS:  YES  TOURNIQUET:  * No tourniquets in log *  DICTATION: .Note written in paper chart  PLAN OF CARE: Discharge to home after PACU  PATIENT DISPOSITION:  Short Stay   Delay start of Pharmacological VTE agent (>24hrs) due to surgical blood loss or risk of bleeding: yes

## 2017-04-23 NOTE — Discharge Instructions (Signed)
Moderate Conscious Sedation, Adult, Care After °These instructions provide you with information about caring for yourself after your procedure. Your health care provider may also give you more specific instructions. Your treatment has been planned according to current medical practices, but problems sometimes occur. Call your health care provider if you have any problems or questions after your procedure. °What can I expect after the procedure? °After your procedure, it is common: °· To feel sleepy for several hours. °· To feel clumsy and have poor balance for several hours. °· To have poor judgment for several hours. °· To vomit if you eat too soon. ° °Follow these instructions at home: °For at least 24 hours after the procedure: ° °· Do not: °? Participate in activities where you could fall or become injured. °? Drive. °? Use heavy machinery. °? Drink alcohol. °? Take sleeping pills or medicines that cause drowsiness. °? Make important decisions or sign legal documents. °? Take care of children on your own. °· Rest. °Eating and drinking °· Follow the diet recommended by your health care provider. °· If you vomit: °? Drink water, juice, or soup when you can drink without vomiting. °? Make sure you have little or no nausea before eating solid foods. °General instructions °· Have a responsible adult stay with you until you are awake and alert. °· Take over-the-counter and prescription medicines only as told by your health care provider. °· If you smoke, do not smoke without supervision. °· Keep all follow-up visits as told by your health care provider. This is important. °Contact a health care provider if: °· You keep feeling nauseous or you keep vomiting. °· You feel light-headed. °· You develop a rash. °· You have a fever. °Get help right away if: °· You have trouble breathing. °This information is not intended to replace advice given to you by your health care provider. Make sure you discuss any questions you have  with your health care provider. °Document Released: 08/27/2013 Document Revised: 04/10/2016 Document Reviewed: 02/26/2016 °Elsevier Interactive Patient Education © 2018 [employer] °Lithotripsy, Care After °This sheet gives you information about how to care for yourself after your procedure. Your health care provider may also give you more specific instructions. If you have problems or questions, contact your health care provider. °What can I expect after the procedure? °After the procedure, it is common to have: °· Some blood in your urine. This should only last for a few days. °· Soreness in your back, sides, or upper abdomen for a few days. °· Blotches or bruises on your back where the pressure wave entered the skin. °· Pain, discomfort, or nausea when pieces (fragments) of the kidney stone move through the tube that carries urine from the kidney to the bladder (ureter). Stone fragments may pass soon after the procedure, but they may continue to pass for up to 4-8 weeks. °? If you have severe pain or nausea, contact your health care provider. This may be caused by a large stone that was not broken up, and this may mean that you need more treatment. °· Some pain or discomfort during urination. °· Some pain or discomfort in the lower abdomen or (in men) at the base of the penis. ° °Follow these instructions at home: °Medicines °· Take over-the-counter and prescription medicines only as told by your health care provider. °· If you were prescribed an antibiotic medicine, take it as told by your health care provider. Do not stop taking the antibiotic even if you   start to feel better.  Do not drive for 24 hours if you were given a medicine to help you relax (sedative).  Do not drive or use heavy machinery while taking prescription pain medicine. Eating and drinking  Drink enough water and fluids to keep your urine clear or pale yellow. This helps any remaining pieces of the stone to pass. It can also help  prevent new stones from forming.  Eat plenty of fresh fruits and vegetables.  Follow instructions from your health care provider about eating and drinking restrictions. You may be instructed: ? To reduce how much salt (sodium) you eat or drink. Check ingredients and nutrition facts on packaged foods and beverages. ? To reduce how much meat you eat.  Eat the recommended amount of calcium for your age and gender. Ask your health care provider how much calcium you should have. General instructions  Get plenty of rest.  Most people can resume normal activities 1-2 days after the procedure. Ask your health care provider what activities are safe for you.  If directed, strain all urine through the strainer that was provided by your health care provider. ? Keep all fragments for your health care provider to see. Any stones that are found may be sent to a medical lab for examination. The stone may be as small as a grain of salt.  Keep all follow-up visits as told by your health care provider. This is important. Contact a health care provider if:  You have pain that is severe or does not get better with medicine.  You have nausea that is severe or does not go away.  You have blood in your urine longer than your health care provider told you to expect.  You have more blood in your urine.  You have pain during urination that does not go away.  You urinate more frequently than usual and this does not go away.  You develop a rash or any other possible signs of an allergic reaction. Get help right away if:  You have severe pain in your back, sides, or upper abdomen.  You have severe pain while urinating.  Your urine is very dark red.  You have blood in your stool (feces).  You cannot pass any urine at all.  You feel a strong urge to urinate after emptying your bladder.  You have a fever or chills.  You develop shortness of breath, difficulty breathing, or chest pain.  You have  severe nausea that leads to persistent vomiting.  You faint. Summary  After this procedure, it is common to have some pain, discomfort, or nausea when pieces (fragments) of the kidney stone move through the tube that carries urine from the kidney to the bladder (ureter). If this pain or nausea is severe, however, you should contact your health care provider.  Most people can resume normal activities 1-2 days after the procedure. Ask your health care provider what activities are safe for you.  Drink enough water and fluids to keep your urine clear or pale yellow. This helps any remaining pieces of the stone to pass, and it can help prevent new stones from forming.  If directed, strain your urine and keep all fragments for your health care provider to see. Fragments or stones may be as small as a grain of salt.  Get help right away if you have severe pain in your back, sides, or upper abdomen or have severe pain while urinating. This information is not intended to replace advice  given to you by your health care provider. Make sure you discuss any questions you have with your health care provider. Document Released: 11/26/2007 Document Revised: 09/27/2016 Document Reviewed: 09/27/2016 Elsevier Interactive Patient Education  2017 [place] may have urinary urgency (bladder spasms), pass small stone fragments, and bloody urine on / off for several days in place. This is normal.  2 - Call MD or go to ER for fever >102, severe pain / nausea / vomiting not relieved by medications, or acute change in medical status

## 2017-04-24 ENCOUNTER — Encounter (HOSPITAL_COMMUNITY): Payer: Self-pay | Admitting: Urology

## 2017-04-24 ENCOUNTER — Other Ambulatory Visit: Payer: Self-pay

## 2017-04-25 ENCOUNTER — Other Ambulatory Visit: Payer: Self-pay

## 2017-05-01 ENCOUNTER — Other Ambulatory Visit: Payer: Self-pay | Admitting: Neurological Surgery

## 2017-05-01 DIAGNOSIS — M5416 Radiculopathy, lumbar region: Secondary | ICD-10-CM

## 2017-05-07 ENCOUNTER — Other Ambulatory Visit: Payer: Self-pay | Admitting: Urology

## 2017-05-14 ENCOUNTER — Encounter (HOSPITAL_BASED_OUTPATIENT_CLINIC_OR_DEPARTMENT_OTHER): Payer: Self-pay | Admitting: *Deleted

## 2017-05-14 NOTE — Progress Notes (Addendum)
NPO AFTER MN.  ARRIVE AT 0915.  NEEDS ISTAT 8 URINE PREG.  CURRENT EKG IN CHART AND EPIC.  WILL TAKE AM MEDS W/ SIPS OF WATER.

## 2017-05-17 NOTE — Anesthesia Preprocedure Evaluation (Addendum)
Anesthesia Evaluation  Patient identified by MRN, date of birth, ID band Patient awake    Reviewed: Allergy & Precautions, NPO status , Patient's Chart, lab work & pertinent test results  Airway Mallampati: I  TM Distance: >3 FB Neck ROM: Full    Dental no notable dental hx.    Pulmonary sleep apnea , Current Smoker,    Pulmonary exam normal breath sounds clear to auscultation       Cardiovascular Exercise Tolerance: Poor hypertension, Pt. on medications Normal cardiovascular exam Rhythm:Regular Rate:Normal     Neuro/Psych  Headaches, PSYCHIATRIC DISORDERS Depression  Neuromuscular disease negative psych ROS   GI/Hepatic Neg liver ROS, GERD  Medicated,  Endo/Other  negative endocrine ROS  Renal/GU negative Renal ROS  negative genitourinary   Musculoskeletal  (+) Arthritis , Fibromyalgia -  Abdominal   Peds negative pediatric ROS (+)  Hematology negative hematology ROS (+)   Anesthesia Other Findings NOT hypothyroid by pt report !  Reproductive/Obstetrics negative OB ROS                            Anesthesia Physical Anesthesia Plan  ASA: II  Anesthesia Plan: General   Post-op Pain Management:    Induction: Intravenous  PONV Risk Score and Plan:   Airway Management Planned: LMA  Additional Equipment:   Intra-op Plan:   Post-operative Plan: Extubation in OR  Informed Consent: I have reviewed the patients History and Physical, chart, labs and discussed the procedure including the risks, benefits and alternatives for the proposed anesthesia with the patient or authorized representative who has indicated his/her understanding and acceptance.   Dental advisory given  Plan Discussed with: CRNA and Surgeon  Anesthesia Plan Comments: ( )        Anesthesia Quick Evaluation

## 2017-05-18 ENCOUNTER — Ambulatory Visit (HOSPITAL_BASED_OUTPATIENT_CLINIC_OR_DEPARTMENT_OTHER): Payer: Medicaid Other | Admitting: Anesthesiology

## 2017-05-18 ENCOUNTER — Ambulatory Visit (HOSPITAL_BASED_OUTPATIENT_CLINIC_OR_DEPARTMENT_OTHER)
Admission: RE | Admit: 2017-05-18 | Discharge: 2017-05-18 | Disposition: A | Discharge status: Home / Self Care | Payer: Medicaid Other | Source: Ambulatory Visit | Attending: Urology | Admitting: Urology

## 2017-05-18 ENCOUNTER — Encounter (HOSPITAL_BASED_OUTPATIENT_CLINIC_OR_DEPARTMENT_OTHER): Payer: Self-pay | Admitting: *Deleted

## 2017-05-18 ENCOUNTER — Encounter (HOSPITAL_BASED_OUTPATIENT_CLINIC_OR_DEPARTMENT_OTHER)
Admission: RE | Disposition: A | Discharge status: Home / Self Care | Payer: Self-pay | Source: Ambulatory Visit | Attending: Urology

## 2017-05-18 DIAGNOSIS — Z886 Allergy status to analgesic agent status: Secondary | ICD-10-CM | POA: Insufficient documentation

## 2017-05-18 DIAGNOSIS — Z981 Arthrodesis status: Secondary | ICD-10-CM | POA: Insufficient documentation

## 2017-05-18 DIAGNOSIS — N201 Calculus of ureter: Secondary | ICD-10-CM | POA: Diagnosis present

## 2017-05-18 DIAGNOSIS — Z800 Family history of malignant neoplasm of digestive organs: Secondary | ICD-10-CM | POA: Insufficient documentation

## 2017-05-18 DIAGNOSIS — Z841 Family history of disorders of kidney and ureter: Secondary | ICD-10-CM | POA: Diagnosis not present

## 2017-05-18 DIAGNOSIS — G43909 Migraine, unspecified, not intractable, without status migrainosus: Secondary | ICD-10-CM | POA: Insufficient documentation

## 2017-05-18 DIAGNOSIS — N202 Calculus of kidney with calculus of ureter: Secondary | ICD-10-CM | POA: Diagnosis not present

## 2017-05-18 DIAGNOSIS — Z833 Family history of diabetes mellitus: Secondary | ICD-10-CM | POA: Insufficient documentation

## 2017-05-18 DIAGNOSIS — Z8249 Family history of ischemic heart disease and other diseases of the circulatory system: Secondary | ICD-10-CM | POA: Insufficient documentation

## 2017-05-18 DIAGNOSIS — I10 Essential (primary) hypertension: Secondary | ICD-10-CM | POA: Insufficient documentation

## 2017-05-18 DIAGNOSIS — E282 Polycystic ovarian syndrome: Secondary | ICD-10-CM | POA: Diagnosis not present

## 2017-05-18 DIAGNOSIS — E785 Hyperlipidemia, unspecified: Secondary | ICD-10-CM | POA: Insufficient documentation

## 2017-05-18 DIAGNOSIS — M797 Fibromyalgia: Secondary | ICD-10-CM | POA: Diagnosis not present

## 2017-05-18 DIAGNOSIS — F419 Anxiety disorder, unspecified: Secondary | ICD-10-CM | POA: Diagnosis not present

## 2017-05-18 DIAGNOSIS — M1990 Unspecified osteoarthritis, unspecified site: Secondary | ICD-10-CM | POA: Diagnosis not present

## 2017-05-18 DIAGNOSIS — Z9049 Acquired absence of other specified parts of digestive tract: Secondary | ICD-10-CM | POA: Insufficient documentation

## 2017-05-18 DIAGNOSIS — Z91041 Radiographic dye allergy status: Secondary | ICD-10-CM | POA: Insufficient documentation

## 2017-05-18 DIAGNOSIS — R000 Tachycardia, unspecified: Secondary | ICD-10-CM | POA: Insufficient documentation

## 2017-05-18 DIAGNOSIS — F332 Major depressive disorder, recurrent severe without psychotic features: Secondary | ICD-10-CM

## 2017-05-18 DIAGNOSIS — K219 Gastro-esophageal reflux disease without esophagitis: Secondary | ICD-10-CM | POA: Diagnosis not present

## 2017-05-18 DIAGNOSIS — Z888 Allergy status to other drugs, medicaments and biological substances status: Secondary | ICD-10-CM | POA: Diagnosis not present

## 2017-05-18 DIAGNOSIS — F14120 Cocaine abuse with intoxication, uncomplicated: Secondary | ICD-10-CM

## 2017-05-18 DIAGNOSIS — Z87442 Personal history of urinary calculi: Secondary | ICD-10-CM | POA: Insufficient documentation

## 2017-05-18 DIAGNOSIS — G4730 Sleep apnea, unspecified: Secondary | ICD-10-CM | POA: Diagnosis not present

## 2017-05-18 DIAGNOSIS — Z825 Family history of asthma and other chronic lower respiratory diseases: Secondary | ICD-10-CM | POA: Diagnosis not present

## 2017-05-18 DIAGNOSIS — F17210 Nicotine dependence, cigarettes, uncomplicated: Secondary | ICD-10-CM | POA: Diagnosis not present

## 2017-05-18 DIAGNOSIS — F329 Major depressive disorder, single episode, unspecified: Secondary | ICD-10-CM | POA: Insufficient documentation

## 2017-05-18 HISTORY — PX: HOLMIUM LASER APPLICATION: SHX5852

## 2017-05-18 HISTORY — DX: Essential (primary) hypertension: I10

## 2017-05-18 HISTORY — DX: Rash and other nonspecific skin eruption: R21

## 2017-05-18 HISTORY — DX: Personal history of cervical dysplasia: Z87.410

## 2017-05-18 HISTORY — PX: CYSTOSCOPY WITH RETROGRADE PYELOGRAM, URETEROSCOPY AND STENT PLACEMENT: SHX5789

## 2017-05-18 HISTORY — DX: Personal history of other specified conditions: Z87.898

## 2017-05-18 HISTORY — DX: Calculus of ureter: N20.1

## 2017-05-18 LAB — POCT I-STAT, CHEM 8
BUN: 17 mg/dL (ref 6–20)
CALCIUM ION: 1.22 mmol/L (ref 1.15–1.40)
CREATININE: 0.7 mg/dL (ref 0.44–1.00)
Chloride: 106 mmol/L (ref 101–111)
GLUCOSE: 110 mg/dL — AB (ref 65–99)
HCT: 41 % (ref 36.0–46.0)
HEMOGLOBIN: 13.9 g/dL (ref 12.0–15.0)
POTASSIUM: 3.9 mmol/L (ref 3.5–5.1)
Sodium: 141 mmol/L (ref 135–145)
TCO2: 24 mmol/L (ref 0–100)

## 2017-05-18 LAB — POCT PREGNANCY, URINE: PREG TEST UR: NEGATIVE

## 2017-05-18 SURGERY — CYSTOURETEROSCOPY, WITH RETROGRADE PYELOGRAM AND STENT INSERTION
Anesthesia: General | Site: Ureter | Laterality: Right

## 2017-05-18 MED ORDER — LIDOCAINE 2% (20 MG/ML) 5 ML SYRINGE
INTRAMUSCULAR | Status: AC
  Filled 2017-05-18: qty 5

## 2017-05-18 MED ORDER — MEPERIDINE HCL 25 MG/ML IJ SOLN
6.2500 mg | INTRAMUSCULAR | Status: DC | PRN
  Filled 2017-05-18: qty 1

## 2017-05-18 MED ORDER — LIDOCAINE 2% (20 MG/ML) 5 ML SYRINGE
INTRAMUSCULAR | Status: DC | PRN
  Administered 2017-05-18: 60 mg via INTRAVENOUS

## 2017-05-18 MED ORDER — OXYCODONE-ACETAMINOPHEN 5-325 MG PO TABS
1.0000 | ORAL_TABLET | Freq: Four times a day (QID) | ORAL | Status: DC | PRN
  Administered 2017-05-18: 1 via ORAL
  Filled 2017-05-18: qty 2

## 2017-05-18 MED ORDER — ONDANSETRON HCL 4 MG/2ML IJ SOLN
INTRAMUSCULAR | Status: DC | PRN
  Administered 2017-05-18: 4 mg via INTRAVENOUS

## 2017-05-18 MED ORDER — PROPOFOL 10 MG/ML IV BOLUS
INTRAVENOUS | Status: DC | PRN
  Administered 2017-05-18: 200 mg via INTRAVENOUS

## 2017-05-18 MED ORDER — OXYCODONE-ACETAMINOPHEN 5-325 MG PO TABS
ORAL_TABLET | ORAL | Status: AC
  Filled 2017-05-18: qty 1

## 2017-05-18 MED ORDER — CEPHALEXIN 500 MG PO CAPS: 500.0000 mg | ORAL_CAPSULE | Freq: Two times a day (BID) | ORAL | 0 refills | Status: DC

## 2017-05-18 MED ORDER — DEXAMETHASONE SODIUM PHOSPHATE 4 MG/ML IJ SOLN
INTRAMUSCULAR | Status: DC | PRN
  Administered 2017-05-18: 10 mg via INTRAVENOUS

## 2017-05-18 MED ORDER — ONDANSETRON HCL 4 MG/2ML IJ SOLN
4.0000 mg | Freq: Once | INTRAMUSCULAR | Status: DC | PRN
  Filled 2017-05-18: qty 2

## 2017-05-18 MED ORDER — FENTANYL CITRATE (PF) 100 MCG/2ML IJ SOLN
INTRAMUSCULAR | Status: DC | PRN
  Administered 2017-05-18: 25 ug via INTRAVENOUS
  Administered 2017-05-18 (×2): 50 ug via INTRAVENOUS

## 2017-05-18 MED ORDER — MIDAZOLAM HCL 5 MG/5ML IJ SOLN
INTRAMUSCULAR | Status: DC | PRN
  Administered 2017-05-18: 2 mg via INTRAVENOUS

## 2017-05-18 MED ORDER — FENTANYL CITRATE (PF) 100 MCG/2ML IJ SOLN
25.0000 ug | INTRAMUSCULAR | Status: DC | PRN
  Administered 2017-05-18: 25 ug via INTRAVENOUS
  Filled 2017-05-18: qty 1

## 2017-05-18 MED ORDER — PROPOFOL 10 MG/ML IV BOLUS
INTRAVENOUS | Status: AC
  Filled 2017-05-18: qty 20

## 2017-05-18 MED ORDER — LACTATED RINGERS IV SOLN
INTRAVENOUS | Status: DC
  Administered 2017-05-18: 10:00:00 via INTRAVENOUS
  Filled 2017-05-18: qty 1000

## 2017-05-18 MED ORDER — IOHEXOL 300 MG/ML  SOLN
INTRAMUSCULAR | Status: DC | PRN
  Administered 2017-05-18: 25 mL via URETHRAL

## 2017-05-18 MED ORDER — FENTANYL CITRATE (PF) 100 MCG/2ML IJ SOLN
INTRAMUSCULAR | Status: AC
  Filled 2017-05-18: qty 2

## 2017-05-18 MED ORDER — SODIUM CHLORIDE 0.9 % IR SOLN
Status: DC | PRN
  Administered 2017-05-18: 3000 mL

## 2017-05-18 MED ORDER — GENTAMICIN SULFATE 40 MG/ML IJ SOLN
5.0000 mg/kg | INTRAMUSCULAR | Status: AC
  Administered 2017-05-18: 500 mg via INTRAVENOUS
  Filled 2017-05-18 (×2): qty 12.5

## 2017-05-18 MED ORDER — MIDAZOLAM HCL 2 MG/2ML IJ SOLN
INTRAMUSCULAR | Status: AC
  Filled 2017-05-18: qty 2

## 2017-05-18 MED ORDER — OXYCODONE-ACETAMINOPHEN 5-325 MG PO TABS: 1.0000 | ORAL_TABLET | Freq: Four times a day (QID) | ORAL | 0 refills | Status: DC | PRN

## 2017-05-18 MED ORDER — SENNOSIDES-DOCUSATE SODIUM 8.6-50 MG PO TABS: 1.0000 | ORAL_TABLET | Freq: Two times a day (BID) | ORAL | 0 refills | Status: DC

## 2017-05-18 SURGICAL SUPPLY — 25 items
BAG DRAIN URO-CYSTO SKYTR STRL (DRAIN) ×3 IMPLANT
BAG DRN UROCATH (DRAIN) ×2
BASKET LASER NITINOL 1.9FR (BASKET) ×3 IMPLANT
BSKT STON RTRVL 120 1.9FR (BASKET) ×2
CATH INTERMIT  6FR 70CM (CATHETERS) ×3 IMPLANT
CLOTH BEACON ORANGE TIMEOUT ST (SAFETY) ×6 IMPLANT
FIBER LASER FLEXIVA 365 (UROLOGICAL SUPPLIES) IMPLANT
FIBER LASER TRAC TIP (UROLOGICAL SUPPLIES) ×3 IMPLANT
GLOVE BIO SURGEON STRL SZ7.5 (GLOVE) ×3 IMPLANT
GOWN STRL REUS W/TWL LRG LVL3 (GOWN DISPOSABLE) ×3 IMPLANT
GOWN STRL REUS W/TWL XL LVL3 (GOWN DISPOSABLE) IMPLANT
GUIDEWIRE ANG ZIPWIRE 038X150 (WIRE) ×3 IMPLANT
GUIDEWIRE STR DUAL SENSOR (WIRE) ×3 IMPLANT
IV NS 1000ML (IV SOLUTION) ×3
IV NS 1000ML BAXH (IV SOLUTION) ×2 IMPLANT
IV NS IRRIG 3000ML ARTHROMATIC (IV SOLUTION) ×3 IMPLANT
KIT RM TURNOVER CYSTO AR (KITS) ×3 IMPLANT
MANIFOLD NEPTUNE II (INSTRUMENTS) ×3 IMPLANT
NS IRRIG 500ML POUR BTL (IV SOLUTION) ×3 IMPLANT
PACK CYSTO (CUSTOM PROCEDURE TRAY) ×3 IMPLANT
SHEATH ACCESS URETERAL 24CM (SHEATH) ×3 IMPLANT
STENT POLARIS LOOP 7FR X 26 CM (STENTS) ×3 IMPLANT
SYRINGE 10CC LL (SYRINGE) ×3 IMPLANT
TUBE CONNECTING 12X1/4 (SUCTIONS) ×3 IMPLANT
TUBE FEEDING 8FR 16IN STR KANG (MISCELLANEOUS) ×3 IMPLANT

## 2017-05-18 NOTE — Anesthesia Procedure Notes (Signed)
Procedure Name: LMA Insertion Date/Time: 05/18/2017 10:25 AM Performed by: Denna Haggard D Pre-anesthesia Checklist: Patient identified, Emergency Drugs available, Suction available and Patient being monitored Patient Re-evaluated:Patient Re-evaluated prior to inductionOxygen Delivery Method: Circle system utilized Preoxygenation: Pre-oxygenation with 100% oxygen Intubation Type: IV induction Ventilation: Mask ventilation without difficulty LMA: LMA inserted LMA Size: 4.0 Number of attempts: 1 Airway Equipment and Method: Bite block Placement Confirmation: positive ETCO2 Tube secured with: Tape Dental Injury: Teeth and Oropharynx as per pre-operative assessment

## 2017-05-18 NOTE — Discharge Instructions (Signed)
1 - You may have urinary urgency (bladder spasms) and bloody urine on / off with stent in place. This is normal. ° °2 - Call MD or go to ER for fever >102, severe pain / nausea / vomiting not relieved by medications, or acute change in medical status ° °[facility] °[phone] °Post Ureteroscopy With or Without Stent Instructions ° °Definitions: ° °Ureter: The duct that transports urine from the kidney to the bladder. °Stent:   A plastic hollow tube that is placed into the ureter, from the kidney to the                 bladder to prevent the ureter from swelling shut. ° °GENERAL INSTRUCTIONS: ° °Despite the fact that no skin incisions were used, the area around the ureter and bladder is raw and irritated. The stent is a foreign body which will further irritate the bladder wall. This irritation is manifested by increased frequency of urination, both day and night, and by an increase in the urge to urinate. In some, the urge to urinate is present almost always. Sometimes the urge is strong enough that you may not be able to stop yourself from urinating. The only real cure is to remove the stent and then give time for the bladder wall to heal which can't be done until the danger of the ureter swelling shut has passed, which varies. ° °You may see some blood in your urine while the stent is in place and a few days afterwards. Do not be alarmed, even if the urine was clear for a while. Get off your feet and drink lots of fluids until clearing occurs. If you start to pass clots or don't improve, call us. ° °DIET: °You may return to your normal diet immediately. Because of the raw surface of your bladder, alcohol, spicy foods, acid type foods and drinks with caffeine may cause irritation or frequency and should be used in moderation. To keep your urine flowing freely and to avoid constipation, drink plenty of fluids during the day ( 8-10 glasses ). °Tip: Avoid cranberry juice because it is very  acidic. ° °ACTIVITY: °Your physical activity doesn't need to be restricted. However, if you are very active, you may see some blood in your urine. We suggest that you reduce your activity under these circumstances until the bleeding has stopped. ° °BOWELS: °It is important to keep your bowels regular during the postoperative period. Straining with bowel movements can cause bleeding. A bowel movement every other day is reasonable. Use a mild laxative if needed, such as Milk of Magnesia 2-3 tablespoons, or 2 Dulcolax tablets. Call if you continue to have problems. If you have been taking narcotics for pain, before, during or after your surgery, you may be constipated. Take a laxative if necessary. ° ° °MEDICATION: °You should resume your pre-surgery medications unless told not to. In addition you will often be given an antibiotic to prevent infection. These should be taken as prescribed until the bottles are finished unless you are having an unusual reaction to one of the drugs. ° °PROBLEMS YOU SHOULD REPORT TO US: °· Fevers over 100.5 Fahrenheit. °· Heavy bleeding, or clots ( See above notes about blood in urine ). °· Inability to urinate. °· Drug reactions ( hives, rash, nausea, vomiting, diarrhea ). °· Severe burning or pain with urination that is not improving. ° °FOLLOW-UP: °You will need a follow-up appointment to monitor your progress. Call for this appointment at the number listed 

## 2017-05-18 NOTE — Interval H&P Note (Signed)
History and Physical Interval Note:  05/18/2017 9:55 AM  [name] [name]  has presented today for surgery, with the diagnosis of RIGHT URETERAL STONE  The various methods of treatment have been discussed with the patient and family. After consideration of risks, benefits and other options for treatment, the patient has consented to  Procedure(s): CYSTOSCOPY WITH RETROGRADE PYELOGRAM, URETEROSCOPY AND STENT PLACEMENT (Right) HOLMIUM LASER APPLICATION (Right) as a surgical intervention .  The patient's history has been reviewed, patient examined, no change in status, stable for surgery.  I have reviewed the patient's chart and labs.  Questions were answered to the patient's satisfaction.     Carlisa Eble

## 2017-05-18 NOTE — Brief Op Note (Signed)
05/18/2017  11:29 AM  PATIENT:  [name] [name]  46 y.o. female  PRE-OPERATIVE DIAGNOSIS:  RIGHT URETERAL STONE  POST-OPERATIVE DIAGNOSIS:  RIGHT URETERAL STONE  PROCEDURE:  Procedure(s): CYSTOSCOPY WITH RETROGRADE PYELOGRAM, URETEROSCOPY AND STENT PLACEMENT (Right) HOLMIUM LASER APPLICATION (Right)  SURGEON:  Surgeon(s) and Role:    * Alexis Frock, MD - Primary  PHYSICIAN ASSISTANT:   ASSISTANTS: none   ANESTHESIA:   general  EBL:  Total I/O In: -  Out: 10 [Blood:10]  BLOOD ADMINISTERED:none  DRAINS: none   LOCAL MEDICATIONS USED:  NONE  SPECIMEN:  Source of Specimen:  Rt ureteral stone fragments  DISPOSITION OF SPECIMEN:  [facility] for compositional analysis  COUNTS:  YES  TOURNIQUET:  * No tourniquets in log *  DICTATION: .Other Dictation: Dictation Number [id number]  PLAN OF CARE: Discharge to home after PACU  PATIENT DISPOSITION:  PACU - hemodynamically stable.   Delay start of Pharmacological VTE agent (>24hrs) due to surgical blood loss or risk of bleeding: yes

## 2017-05-18 NOTE — Op Note (Signed)
NAME:  [name] [name], [name] [name]                  ACCOUNT NO.:  MEDICAL RECORD NO.:  [id number]  LOCATION:                                 FACILITY:  PHYSICIAN:  Alexis Frock, MD          DATE OF BIRTH:  DATE OF PROCEDURE: 05/18/2017                                OPERATIVE REPORT   PREOPERATIVE DIAGNOSIS:  Right ureteral stone refractory to lithotripsy.  PROCEDURE: 1. Cystoscopy with right retrograde pyelogram and interpretation. 2. Right ureteroscopy with lithotripsy. 3. Insertion of right ureteral stent, 7 x 26 polaris, no tether.  ESTIMATED BLOOD LOSS:  Nil.  COMPLICATION:  None.  SPECIMENS:  Right ureteral stone fragments for compositional analysis.  FINDINGS: 1. Severely impacted right mid ureteral stone, minimal evidence of     prior fragmentation with lithotripsy. 2. Removal of right renal and ureteral stone fragments larger than one-     third millimeter, following laser lithotripsy and basket     extraction. 3. Successful placement of right ureteral stent, proximal in renal     pelvis and distal in urinary bladder.  INDICATION:  [name] is a 46 year old lady, who was found on workup of colicky flank pain to have a very large right mid ureteral stone. She underwent an attempted shockwave lithotripsy earlier this month that was technically successful; however, her stone fragmented minimally. She remained quite symptomatic.  Options were discussed including continued medical therapy versus repeat shockwave lithotripsy versus ureteroscopy, and she wished to proceed with the latter.  Informed consent was obtained and placed in the medical record.  PROCEDURE IN DETAIL:  The patient being, [name] [name], was verified. Procedure being, right ureteroscopic stone manipulation, was confirmed. Procedure was carried out.  Time-out was performed.  Intravenous antibiotics were administered.  General anesthesia introduced.  The patient was placed into a low lithotomy position,  and sterile field was created by prepping and draping the patient's vagina, introitus, and proximal thighs using iodine.  Next, cystourethroscopy was performed using a 22-French rigid cystoscope with offset lens.  Inspection of urinary bladder revealed no diverticula, calcifications, papillary lesions.  The right ureteral orifice was cannulated with a 6-French end-hole catheter and right retrograde pyelogram was obtained.  Right retrograde pyelogram demonstrated a single right ureter with single-system right kidney.  There was a large filling defect and calcification consistent with known mid ureteral stone.  A 0.038 ZIPwire was advanced to the level of the upper pole, set aside as a safety wire. An 8-French feeding tube placed in urinary bladder for pressure release, and semi-rigid ureteroscopy was performed in the distal right ureter alongside a separate Sensor working wire.  As expected, there was a very impacted mid ureteral stone.  This appeared to be much too large for simple basketing.  As such, holmium laser energy applied to the stone using settings of 0.4 joules and 50 Hz, fragmenting the stone approximately to 4 smaller pieces.  Two of these were retrograde positioned.  The smaller pieces were then broke up into 4 additional smaller fragments.  They were then amenable to basketing with an Escape basket.  They were removed and set aside for compositional analysis. Given  retrograde positioning of the stone and goal of ipsilateral stone free, the semi-rigid scope was then exchanged for a 24 cm ureteral access sheath at the level of proximal ureter using continuous fluoroscopic guidance taking exquisite care not to pass proximal to visualized portion of the ureter, and flexible digital ureteroscopy was performed in the proximal ureter and systematic inspection of the right kidney.  As expected, the retrograde positioning of prior ureteral stone into lower pole calyx.  These were  again too large for simple basketing. As such, holmium laser energy applied to the stones, fragmented into again the smaller pieces that were amenable to basketing.  Following these maneuvers, excellent hemostasis, no evidence of renal perforation, all stones larger than one-third millimeter mm had been removed.  The access sheath was removed under continuous vision.  There was significant mucosal edema at this prior site of stone impaction as expected, but no evidence of urothelial disruption.  Given the mucosal edema, it was felt that interval stenting would be warranted with a non- tethered stent.  As such, a 7 x 24 Polaris-type stent was placed using cystoscopic and fluoroscopic guidance.  Good proximal and distal deployment were noted, and procedure was terminated.  The patient tolerated the procedure well.  There were no immediate periprocedural complications.  The patient was taken to the postanesthesia care unit in stable condition.          ______________________________ Alexis Frock, MD     TM/MEDQ  D:  05/18/2017  T:  05/18/2017  Job:  [id number]

## 2017-05-18 NOTE — H&P (Signed)
[name] [name] is an 46 y.o. female.    Chief Complaint: Pre-op RIGHT Ureteroscopic Stone Manipulation  HPI:   1 - Large Right Ureteral Stone - 40mm Rt mid ureteral stone by CT on eval flank pain. Had shockwave lithotripsy 6/4 with excellent targeting but pt has passed minimal stone manterial and majority of stone persists by KUB.  Today "[name] [name]" presents for right ureteroscopic stone manipulation to address residual right ureteral stone. No interval fevers. Most recent UA without infectious parameters.   Past Medical History:  Diagnosis Date  . Anxiety   . Depression   . Fibromyalgia   . GERD (gastroesophageal reflux disease)   . History of cervical dysplasia   . History of exercise intolerance    normal ETT 10-24-2016  . Hyperlipidemia   . Migraines   . Osteoarthritis   . PCOS (polycystic ovarian syndrome)   . Rash    in area of eswl 04-23-2017  . Right ureteral calculus   . Tachycardia cardiologist-  dr harding   controlled w/ metoprolol    Past Surgical History:  Procedure Laterality Date  . ANTERIOR CERVICAL DECOMP/DISCECTOMY FUSION N/A 09/01/2015   Procedure: ANTERIOR CERVICAL DECOMPRESSION/DISCECTOMY FUSION PLATING BONEGRAFT CERVICAL FIVE-SIX;  Surgeon: Kevan Ny Ditty, MD;  Location: [facility];  Service: Neurosurgery;  Laterality: N/A;  . BREAST EXCISIONAL BIOPSY Bilateral left 10/15/2002;   right 1997   left ductal system excision (papilloma w/ florid epithelial hyperplasia)/  right benign lumpectomy  . CARDIOVASCULAR STRESS TEST  04/27/2009   normal nuclear study w/ no ischemia/  normal LV function and wall motion , ef 66%  . CHOLECYSTECTOMY N/A 03/13/2013   Procedure: LAPAROSCOPIC CHOLECYSTECTOMY;  Surgeon: Harl Bowie, MD;  Location: [facility];  Service: General;  Laterality: N/A;  . COLONOSCOPY  last one 11-08-2006  . DX LAPAROSCOPY W/ LYSIS ADHESIONS/  LASER VAPORIZATION OF CERVIX  06/25/2002  . ESOPHAGOGASTRODUODENOSCOPY   04/25/2005  . EXPLORATORY LEFT THUMB REPAIR TENDON/ LACERATION  09/27/1999  . EXTRACORPOREAL SHOCK WAVE LITHOTRIPSY Right 04/23/2017   Procedure: RIGHT EXTRACORPOREAL SHOCK WAVE LITHOTRIPSY (ESWL);  Surgeon: Alexis Frock, MD;  Location: [facility];  Service: Urology;  Laterality: Right;  . EXTRACORPOREAL SHOCK WAVE LITHOTRIPSY Right 04/23/2017  . LAPAROSCOPY  07/30/2012   Procedure: LAPAROSCOPY DIAGNOSTIC;  Surgeon: Logan Bores, MD;  Location: [facility];  Service: Gynecology;  Laterality: N/A;  . WISDOM TOOTH EXTRACTION      Family History  Problem Relation Age of Onset  . Diabetes Father   . Hypertension Father   . Diabetes Mother   . Hypertension Mother   . Asthma Paternal Grandmother   . Kidney disease Paternal Grandmother   . Emphysema Unknown   . Tongue cancer Unknown   . Coronary artery disease Unknown    Social History:  reports that she has been smoking Cigarettes.  She has a 6.25 pack-year smoking history. She has never used smokeless tobacco. She reports that she does not drink alcohol or use drugs.  Allergies:  Allergies  Allergen Reactions  . Aspirin Shortness Of Breath and Other (See Comments)    Angiodema  . Contrast Media [Iodinated Diagnostic Agents] Anaphylaxis and Swelling    Facial swelling, throat swelling(per pt w/ pre-op protocol no issues)  . Ibuprofen Other (See Comments)    Angioedema  . Nsaids Anaphylaxis    Swelling of eyes mouth and throat difficulty breathing    No prescriptions prior to admission.    No results found for this or  any previous visit (from the past 48 hour(s)). No results found.  Review of Systems  Constitutional: Negative.  Negative for chills and fever.  HENT: Negative.   Eyes: Negative.   Respiratory: Negative.   Cardiovascular: Negative.   Gastrointestinal: Negative.   Genitourinary: Positive for flank pain.  Skin: Negative.   Neurological: Negative.   Endo/Heme/Allergies: Negative.   Psychiatric/Behavioral:  Negative.     Height 5\' 7"  (1.702 m), weight 99.3 kg (219 lb), last menstrual period 04/04/2017. Physical Exam  Constitutional: She appears well-developed.  HENT:  Head: Normocephalic.  Neck: Normal range of motion.  Cardiovascular: Normal rate.   Respiratory: Effort normal.  GI: Soft.  Moderate truncal obesity.   Genitourinary:  Genitourinary Comments: Mild RIGHT CVAT at present.   Musculoskeletal: Normal range of motion.  Neurological: She is alert.  Skin: Skin is warm.  Psychiatric: She has a normal mood and affect.     Assessment/Plan  Proceed as planned with RIGHT ureteroscopic stone manipulation. Risks, benefits, alternatives, expected peri-op course, need for temporary stents discussed previously and reiterated today.   Alexis Frock, MD 05/18/2017, 6:16 AM

## 2017-05-18 NOTE — Transfer of Care (Signed)
Immediate Anesthesia Transfer of Care Note  Patient: [name] [name]  Procedure(s) Performed: Procedure(s) (LRB): CYSTOSCOPY WITH RETROGRADE PYELOGRAM, URETEROSCOPY AND STENT PLACEMENT (Right) HOLMIUM LASER APPLICATION (Right)  Patient Location: PACU  Anesthesia Type: General  Level of Consciousness: awake, oriented, sedated and patient cooperative  Airway & Oxygen Therapy: Patient Spontanous Breathing and Patient connected to face mask oxygen  Post-op Assessment: Report given to PACU RN and Post -op Vital signs reviewed and stable  Post vital signs: Reviewed and stable  Complications: No apparent anesthesia complications Last Vitals:  Vitals:   05/18/17 1140 05/18/17 1145  BP: 140/89 (!) 141/86  Pulse: 83 78  Resp: 12 12  Temp: 36.5 C     Last Pain:  Vitals:   05/18/17 0913  TempSrc: Oral

## 2017-05-21 ENCOUNTER — Encounter (HOSPITAL_BASED_OUTPATIENT_CLINIC_OR_DEPARTMENT_OTHER): Payer: Self-pay | Admitting: Urology

## 2017-05-21 NOTE — Addendum Note (Signed)
Addendum  created 05/21/17 1519 by Janeece Riggers, MD   Sign clinical note

## 2017-05-21 NOTE — Anesthesia Postprocedure Evaluation (Signed)
Anesthesia Post Note  Patient: DESTINEE TABER  Procedure(s) Performed: Procedure(s) (LRB): CYSTOSCOPY WITH RETROGRADE PYELOGRAM, URETEROSCOPY AND STENT PLACEMENT (Right) HOLMIUM LASER APPLICATION (Right)     Patient location during evaluation: PACU Anesthesia Type: General Level of consciousness: awake and alert Pain management: pain level controlled Vital Signs Assessment: post-procedure vital signs reviewed and stable Respiratory status: spontaneous breathing, nonlabored ventilation, respiratory function stable and patient connected to nasal cannula oxygen Cardiovascular status: blood pressure returned to baseline and stable Postop Assessment: no signs of nausea or vomiting Anesthetic complications: no    Last Vitals:  Vitals:   05/18/17 1230 05/18/17 1300  BP: 140/87 (!) 147/87  Pulse: 64 65  Resp: 10   Temp:  36.8 C    Last Pain:  Vitals:   05/21/17 1410  TempSrc:   PainSc: 8    Pain Goal:                 Bertram Haddix

## 2017-05-21 NOTE — Anesthesia Postprocedure Evaluation (Signed)
Anesthesia Post Note  Patient: [name] [name]  Procedure(s) Performed: Procedure(s) (LRB): CYSTOSCOPY WITH RETROGRADE PYELOGRAM, URETEROSCOPY AND STENT PLACEMENT (Right) HOLMIUM LASER APPLICATION (Right)     Patient location during evaluation: PACU Anesthesia Type: General Level of consciousness: awake and alert Pain management: pain level controlled Vital Signs Assessment: post-procedure vital signs reviewed and stable Respiratory status: spontaneous breathing, nonlabored ventilation, respiratory function stable and patient connected to nasal cannula oxygen Cardiovascular status: blood pressure returned to baseline and stable Postop Assessment: no signs of nausea or vomiting Anesthetic complications: no    Last Vitals:  Vitals:   05/18/17 1230 05/18/17 1300  BP: 140/87 (!) 147/87  Pulse: 64 65  Resp: 10   Temp:  36.8 C    Last Pain:  Vitals:   05/18/17 1300  TempSrc:   PainSc: 4    Pain Goal:                 [name]

## 2017-05-29 ENCOUNTER — Other Ambulatory Visit: Payer: Self-pay

## 2017-06-12 ENCOUNTER — Ambulatory Visit (HOSPITAL_BASED_OUTPATIENT_CLINIC_OR_DEPARTMENT_OTHER): Payer: Medicare Other | Attending: Family Medicine | Admitting: Internal Medicine

## 2017-06-12 VITALS — Ht 67.0 in | Wt 219.0 lb

## 2017-06-12 DIAGNOSIS — R0683 Snoring: Secondary | ICD-10-CM | POA: Insufficient documentation

## 2017-06-12 DIAGNOSIS — R5383 Other fatigue: Secondary | ICD-10-CM | POA: Insufficient documentation

## 2017-06-12 DIAGNOSIS — G4733 Obstructive sleep apnea (adult) (pediatric): Secondary | ICD-10-CM | POA: Insufficient documentation

## 2017-06-23 DIAGNOSIS — R0683 Snoring: Secondary | ICD-10-CM | POA: Diagnosis not present

## 2017-06-23 NOTE — Procedures (Signed)
Patient Name: [name] [name], [name] [name] Date: 06/12/2017 Gender: Female D.O.B: 03/14/1971 Age (years): 77 Referring Provider: Horald Pollen Height (inches): 18 Interpreting Physician: Baird Lyons MD, ABSM Weight (lbs): 219 RPSGT: Carolin Coy BMI: 34 MRN: [id number] Neck Size: 15.00 CLINICAL INFORMATION Sleep Study Type: Split Night CPAP  Indication for sleep study: Fatigue, Hypertension, Obesity, OSA, Snoring  Epworth Sleepiness Score: 15  SLEEP STUDY TECHNIQUE As per the AASM Manual for the Scoring of Sleep and Associated Events v2.3 (April 2016) with a hypopnea requiring 4% desaturations.  The channels recorded and monitored were frontal, central and occipital EEG, electrooculogram (EOG), submentalis EMG (chin), nasal and oral airflow, thoracic and abdominal wall motion, anterior tibialis EMG, snore microphone, electrocardiogram, and pulse oximetry. Continuous positive airway pressure (CPAP) was initiated when the patient met split night criteria and was titrated according to treat sleep-disordered breathing.  MEDICATIONS Medications self-administered by patient taken the night of the study : LATUDA, LAMICTAL, COGENTIA, BUPROPION SR, METOPROLOL SUCCINATE, TRAZODONE  RESPIRATORY PARAMETERS Diagnostic  Total AHI (/hr): 50.0 RDI (/hr): 55.9 OA Index (/hr): 45.9 CA Index (/hr): 0.0 REM AHI (/hr): 107.1 NREM AHI (/hr): 44.6 Supine AHI (/hr): 93.8 Non-supine AHI (/hr): 21.44 Min O2 Sat (%): 92.00 Mean O2 (%): 96.14 Time below 88% (min): 0.0   Titration  Optimal Pressure (cm): 17 AHI at Optimal Pressure (/hr): 0.0 Min O2 at Optimal Pressure (%): 95.0 Supine % at Optimal (%): 100 Sleep % at Optimal (%): 100   SLEEP ARCHITECTURE The recording time for the entire night was 393.7 minutes.  During a baseline period of 177.2 minutes, the patient slept for 162.0 minutes in REM and nonREM, yielding a sleep efficiency of 91.4%. Sleep onset after lights out was 11.2 minutes with a REM  latency of 92.5 minutes. The patient spent 11.42% of the night in stage N1 sleep, 79.93% in stage N2 sleep, 0.00% in stage N3 and 8.64% in REM.  During the titration period of 215.5 minutes, the patient slept for 193.5 minutes in REM and nonREM, yielding a sleep efficiency of 89.8%. Sleep onset after CPAP initiation was 12.0 minutes with a REM latency of 70.5 minutes. The patient spent 8.26% of the night in stage N1 sleep, 59.18% in stage N2 sleep, 0.52% in stage N3 and 32.04% in REM.  CARDIAC DATA The 2 lead EKG demonstrated sinus rhythm. The mean heart rate was 64.00 beats per minute. Other EKG findings include: None.  LEG MOVEMENT DATA The total Periodic Limb Movements of Sleep (PLMS) were 0. The PLMS index was 0.00 .  IMPRESSIONS - Severe obstructive sleep apnea occurred during the diagnostic portion of the study (AHI = 50.0/hour). An optimal PAP pressure was selected for this patient ( 17 cm of water) - No significant central sleep apnea occurred during the diagnostic portion of the study (CAI = 0.0/hour). - The patient had minimal or no oxygen desaturation during the diagnostic portion of the study (Min O2 = 92.00%) - The patient snored with Loud snoring volume during the diagnostic portion of the study. - No cardiac abnormalities were noted during this study. - Clinically significant periodic limb movements did not occur during sleep.  DIAGNOSIS - Obstructive Sleep Apnea (327.23 [G47.33 ICD-10])  RECOMMENDATIONS - Trial of CPAP therapy on 17 cm H2O with a Small size Resmed Full Face Mask AirFit F10 mask and heated humidification. - Be careful with alcohol, sedatives and other CNS depressants that may worsen sleep apnea and disrupt normal sleep architecture. - Sleep hygiene should be reviewed to  assess factors that may improve sleep quality. - Weight management and regular exercise should be initiated or continued.  [Electronically signed] 06/23/2017 12:19 PM  Baird Lyons MD,  [place], American Board of Sleep Medicine   NPI: [id number]  [place], American Board of Sleep Medicine  ELECTRONICALLY SIGNED ON:  06/23/2017, 12:17 PM [place] PH: [phone]   FX: [phone] [place]

## 2017-07-04 ENCOUNTER — Ambulatory Visit
Admission: RE | Admit: 2017-07-04 | Discharge: 2017-07-04 | Disposition: A | Discharge status: Home / Self Care | Payer: Medicaid Other | Source: Ambulatory Visit | Attending: Gastroenterology | Admitting: Gastroenterology

## 2017-07-04 DIAGNOSIS — R1310 Dysphagia, unspecified: Secondary | ICD-10-CM

## 2017-08-20 ENCOUNTER — Ambulatory Visit
Admission: RE | Admit: 2017-08-20 | Discharge: 2017-08-20 | Disposition: A | Discharge status: Home / Self Care | Payer: Medicare Other | Source: Ambulatory Visit | Attending: Family Medicine | Admitting: Family Medicine

## 2017-08-20 ENCOUNTER — Other Ambulatory Visit: Payer: Self-pay | Admitting: Family Medicine

## 2017-08-20 DIAGNOSIS — R059 Cough, unspecified: Secondary | ICD-10-CM

## 2017-08-20 DIAGNOSIS — R05 Cough: Secondary | ICD-10-CM

## 2017-09-12 ENCOUNTER — Ambulatory Visit: Payer: Self-pay | Admitting: Nurse Practitioner

## 2017-10-24 ENCOUNTER — Encounter: Payer: Self-pay | Admitting: Physical Therapy

## 2017-10-24 ENCOUNTER — Other Ambulatory Visit: Payer: Self-pay

## 2017-10-24 ENCOUNTER — Ambulatory Visit: Payer: PPO | Attending: Family Medicine | Admitting: Physical Therapy

## 2017-10-24 DIAGNOSIS — R42 Dizziness and giddiness: Secondary | ICD-10-CM | POA: Insufficient documentation

## 2017-10-24 DIAGNOSIS — H8111 Benign paroxysmal vertigo, right ear: Secondary | ICD-10-CM | POA: Insufficient documentation
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
## 2017-10-26 DIAGNOSIS — G4733 Obstructive sleep apnea (adult) (pediatric): Secondary | ICD-10-CM | POA: Diagnosis not present

## 2017-10-31 ENCOUNTER — Ambulatory Visit: Payer: PPO | Admitting: Physical Therapy

## 2017-11-06 DIAGNOSIS — F603 Borderline personality disorder: Secondary | ICD-10-CM | POA: Diagnosis not present

## 2017-11-08 DIAGNOSIS — B354 Tinea corporis: Secondary | ICD-10-CM | POA: Diagnosis not present

## 2017-11-21 DIAGNOSIS — F603 Borderline personality disorder: Secondary | ICD-10-CM | POA: Diagnosis not present

## 2017-11-23 ENCOUNTER — Ambulatory Visit: Payer: Self-pay | Admitting: Nurse Practitioner

## 2017-11-28 DIAGNOSIS — F603 Borderline personality disorder: Secondary | ICD-10-CM | POA: Diagnosis not present

## 2017-12-03 DIAGNOSIS — F603 Borderline personality disorder: Secondary | ICD-10-CM | POA: Diagnosis not present

## 2017-12-11 DIAGNOSIS — F603 Borderline personality disorder: Secondary | ICD-10-CM | POA: Diagnosis not present

## 2017-12-19 ENCOUNTER — Other Ambulatory Visit: Payer: Self-pay | Admitting: Family Medicine

## 2017-12-19 DIAGNOSIS — Z1231 Encounter for screening mammogram for malignant neoplasm of breast: Secondary | ICD-10-CM

## 2017-12-20 DIAGNOSIS — F315 Bipolar disorder, current episode depressed, severe, with psychotic features: Secondary | ICD-10-CM | POA: Diagnosis not present

## 2017-12-20 DIAGNOSIS — G4733 Obstructive sleep apnea (adult) (pediatric): Secondary | ICD-10-CM | POA: Diagnosis not present

## 2018-01-01 ENCOUNTER — Other Ambulatory Visit: Payer: Self-pay

## 2018-01-01 NOTE — Patient Outreach (Signed)
[facility]) Care Management  01/01/2018  [name] [name] 1971/05/09 [id number]   Telephone call to review health risk assessment and screen for care management needs for  [employer]. Member states she is leaving the house and requests RNCM to call back another day. Plan to contact 01/03/18 to complete screening call. Peter Garter RN, [facility] Care Management Coordinator [facility] Care Management [phone]

## 2018-01-03 ENCOUNTER — Other Ambulatory Visit: Payer: Self-pay

## 2018-01-03 NOTE — Patient Outreach (Signed)
[facility]) Care Management  01/03/2018  [name] [name] 1971/05/09 [id number]   Telephone call to review health risk assessment and screen for care management needs for  [employer].  Member states that she has a hx of being bipolar and she has fibromyalgia.  States she sees her therapist regularly and she is on medication that keeps her moods under control.  States she can afford her medications.  States when she needs help at home her Mother assists her and drives her when she does not feel like driving. States she lives with her Mother.  States she has an appt with her new provider in May.  States she is still smoking but she is thinking about quitting and would like more information about the [employer].  Denies any case management needs at this time. Plan to mail [employer] information and successful outreach letter. Member assessed with no further interventions needed.  Peter Garter RN, [facility] Care Management Coordinator [facility] Care Management [phone]

## 2018-01-04 DIAGNOSIS — F603 Borderline personality disorder: Secondary | ICD-10-CM | POA: Diagnosis not present

## 2018-01-09 ENCOUNTER — Ambulatory Visit: Payer: Self-pay

## 2018-01-11 DIAGNOSIS — F603 Borderline personality disorder: Secondary | ICD-10-CM | POA: Diagnosis not present

## 2018-01-17 DIAGNOSIS — G4733 Obstructive sleep apnea (adult) (pediatric): Secondary | ICD-10-CM | POA: Diagnosis not present

## 2018-01-24 DIAGNOSIS — F315 Bipolar disorder, current episode depressed, severe, with psychotic features: Secondary | ICD-10-CM | POA: Diagnosis not present

## 2018-01-24 DIAGNOSIS — R5383 Other fatigue: Secondary | ICD-10-CM | POA: Diagnosis not present

## 2018-01-29 ENCOUNTER — Ambulatory Visit
Admission: RE | Admit: 2018-01-29 | Discharge: 2018-01-29 | Disposition: A | Discharge status: Home / Self Care | Payer: PPO | Source: Ambulatory Visit | Attending: Family Medicine | Admitting: Family Medicine

## 2018-01-29 DIAGNOSIS — Z1231 Encounter for screening mammogram for malignant neoplasm of breast: Secondary | ICD-10-CM | POA: Diagnosis not present

## 2018-01-30 ENCOUNTER — Other Ambulatory Visit: Payer: Self-pay | Admitting: Family Medicine

## 2018-01-30 DIAGNOSIS — Z79899 Other long term (current) drug therapy: Secondary | ICD-10-CM | POA: Diagnosis not present

## 2018-01-30 DIAGNOSIS — F319 Bipolar disorder, unspecified: Secondary | ICD-10-CM | POA: Diagnosis not present

## 2018-01-30 DIAGNOSIS — G4733 Obstructive sleep apnea (adult) (pediatric): Secondary | ICD-10-CM | POA: Diagnosis not present

## 2018-01-30 DIAGNOSIS — R7309 Other abnormal glucose: Secondary | ICD-10-CM | POA: Diagnosis not present

## 2018-01-30 DIAGNOSIS — R928 Other abnormal and inconclusive findings on diagnostic imaging of breast: Secondary | ICD-10-CM

## 2018-02-04 ENCOUNTER — Ambulatory Visit
Admission: RE | Admit: 2018-02-04 | Discharge: 2018-02-04 | Disposition: A | Discharge status: Home / Self Care | Payer: PPO | Source: Ambulatory Visit | Attending: Family Medicine | Admitting: Family Medicine

## 2018-02-04 ENCOUNTER — Other Ambulatory Visit: Payer: Self-pay | Admitting: Family Medicine

## 2018-02-04 DIAGNOSIS — R928 Other abnormal and inconclusive findings on diagnostic imaging of breast: Secondary | ICD-10-CM

## 2018-02-04 DIAGNOSIS — N6313 Unspecified lump in the right breast, lower outer quadrant: Secondary | ICD-10-CM | POA: Diagnosis not present

## 2018-02-04 DIAGNOSIS — N6310 Unspecified lump in the right breast, unspecified quadrant: Secondary | ICD-10-CM

## 2018-02-04 DIAGNOSIS — N6311 Unspecified lump in the right breast, upper outer quadrant: Secondary | ICD-10-CM | POA: Diagnosis not present

## 2018-02-12 DIAGNOSIS — F315 Bipolar disorder, current episode depressed, severe, with psychotic features: Secondary | ICD-10-CM | POA: Diagnosis not present

## 2018-02-17 DIAGNOSIS — G4733 Obstructive sleep apnea (adult) (pediatric): Secondary | ICD-10-CM | POA: Diagnosis not present

## 2018-02-19 DIAGNOSIS — R69 Illness, unspecified: Secondary | ICD-10-CM | POA: Diagnosis not present

## 2018-02-21 DIAGNOSIS — M5432 Sciatica, left side: Secondary | ICD-10-CM | POA: Diagnosis not present

## 2018-02-27 DIAGNOSIS — R69 Illness, unspecified: Secondary | ICD-10-CM | POA: Diagnosis not present

## 2018-03-13 DIAGNOSIS — N920 Excessive and frequent menstruation with regular cycle: Secondary | ICD-10-CM | POA: Diagnosis not present

## 2018-03-13 DIAGNOSIS — R102 Pelvic and perineal pain: Secondary | ICD-10-CM | POA: Diagnosis not present

## 2018-03-13 DIAGNOSIS — N946 Dysmenorrhea, unspecified: Secondary | ICD-10-CM | POA: Diagnosis not present

## 2018-03-14 DIAGNOSIS — Z124 Encounter for screening for malignant neoplasm of cervix: Secondary | ICD-10-CM | POA: Diagnosis not present

## 2018-03-19 DIAGNOSIS — G4733 Obstructive sleep apnea (adult) (pediatric): Secondary | ICD-10-CM | POA: Diagnosis not present

## 2018-03-25 ENCOUNTER — Encounter: Payer: Self-pay | Admitting: Family Medicine

## 2018-03-25 ENCOUNTER — Ambulatory Visit (INDEPENDENT_AMBULATORY_CARE_PROVIDER_SITE_OTHER): Payer: Medicare HMO | Admitting: Family Medicine

## 2018-03-25 VITALS — BP 126/72 | HR 79 | Temp 98.9°F | Resp 12 | Ht 67.0 in | Wt 220.1 lb

## 2018-03-25 DIAGNOSIS — I10 Essential (primary) hypertension: Secondary | ICD-10-CM

## 2018-03-25 DIAGNOSIS — E559 Vitamin D deficiency, unspecified: Secondary | ICD-10-CM | POA: Diagnosis not present

## 2018-03-25 DIAGNOSIS — L0292 Furuncle, unspecified: Secondary | ICD-10-CM

## 2018-03-25 DIAGNOSIS — Z9989 Dependence on other enabling machines and devices: Secondary | ICD-10-CM | POA: Diagnosis not present

## 2018-03-25 DIAGNOSIS — R000 Tachycardia, unspecified: Secondary | ICD-10-CM | POA: Diagnosis not present

## 2018-03-25 DIAGNOSIS — R5383 Other fatigue: Secondary | ICD-10-CM

## 2018-03-25 DIAGNOSIS — M797 Fibromyalgia: Secondary | ICD-10-CM

## 2018-03-25 DIAGNOSIS — G4733 Obstructive sleep apnea (adult) (pediatric): Secondary | ICD-10-CM | POA: Diagnosis not present

## 2018-03-25 DIAGNOSIS — K219 Gastro-esophageal reflux disease without esophagitis: Secondary | ICD-10-CM | POA: Diagnosis not present

## 2018-03-25 MED ORDER — DOXYCYCLINE HYCLATE 100 MG PO TABS: ORAL_TABLET | ORAL | 0 refills | Status: DC

## 2018-03-25 MED ORDER — PREGABALIN 225 MG PO CAPS: 225.0000 mg | ORAL_CAPSULE | Freq: Two times a day (BID) | ORAL | 0 refills | Status: DC

## 2018-03-25 MED ORDER — CARVEDILOL 12.5 MG PO TABS: 12.5000 mg | ORAL_TABLET | Freq: Two times a day (BID) | ORAL | 1 refills | Status: DC

## 2018-03-25 NOTE — Assessment & Plan Note (Signed)
She will continue Ergocalciferol 50,000 units weekly. We will plan on rechecking vitamin D next visit.

## 2018-03-25 NOTE — Assessment & Plan Note (Signed)
Problem has been well controlled on omeprazole 40 mg daily. GERD precautions to continue. Follow-up in 12 months, before if needed.

## 2018-03-25 NOTE — Assessment & Plan Note (Signed)
Well-controlled on carvedilol 12.5 mg twice daily. She will continue monitoring for worrisome signs.

## 2018-03-25 NOTE — Assessment & Plan Note (Signed)
She is wearing CPAP. Referral to pulmonology was placed.

## 2018-03-25 NOTE — Progress Notes (Signed)
HPI:   [name] is a 47 y.o. female, who is here today to establish care.  Former PCP: [name] Hibbs, NP Last preventive routine visit: 10/2012 Last gyn preventive visit 02/2017. S/P hysterectomy.  Chronic medical problems: Generalized OA, nephrolithiasis, fibromyalgia, bipolar disorder, hypertension, GERD, and OSA among some.   She is on disability due to bipolar disorder, borderline personality, and anxiety. She follows with psychiatrist,[name] [name] every 4 to 6 weeks and psychotherapist weekly.  She lives with her parents. She does not exercise regularly and not consistent with a healthy diet.   Fatigue: Worse since 11/2017.  According to patient, she had lab work done 5 to 6 weeks ago, otherwise negative.  Vitamin D deficiency: Last 67 OH vit D reported as 13. Currently she is on ergocalciferol 50,000 units weekly, started about 5 weeks ago.  GERD: Omeprazole 40 mg. Heartburn, exacerbated by large meals. Symptoms well controlled with PPI.  Denies abdominal pain, nausea, vomiting, changes in bowel habits, blood in stool or melena.  Fibromyalgia: Currently she is on Lyrica 225 mg twice daily, according to patient it was decreased from 300 mg twice daily to 225 twice daily because memory issues.  She noted improvement of her memory when Lyrica dose was decreased but generalized pain got worse. Pain is mainly at night, interfering with sleep.  She has been on Cymbalta and gabapentin in the past, Lyrica is the medication that has helped the most.  Generalized OA diagnosed about 10 to 15 years ago.  According to patient, she had negative rheumatologic work-up.   Hypertension: Diagnosed during summer 2018. She was initially on metoprolol, discontinued because of elevated depression. She is currently on carvedilol 12.5 mg twice daily, which was also prescribed to treat sinus tachycardia. She is tolerating medication well, no side effects  reported.  OSA: Last sleep study in 2018, she is currently on CPAP. She has not followed with provider and she wonders who she needs to see to continue monitoring OSA.  Recurrent "boils": For about a year she has recurrent tender, erythematous lesions on axillas and pubic area mainly,  occasionally on breast. She has not identified exacerbating factors. Usually alleviated by draining lesion and/or oral antibiotics. Currently she has a lesion in left axilla, which is slowly improving,she has not been able to drain. She has not noted fever or chills.    Review of Systems  Constitutional: Positive for fatigue. Negative for activity change, appetite change and fever.  HENT: Negative for mouth sores, nosebleeds and trouble swallowing.   Eyes: Negative for redness and visual disturbance.  Respiratory: Negative for cough, shortness of breath and wheezing.   Cardiovascular: Negative for chest pain, palpitations and leg swelling.  Gastrointestinal: Negative for abdominal pain, nausea and vomiting.       Negative for changes in bowel habits.  Endocrine: Negative for cold intolerance and heat intolerance.  Genitourinary: Negative for decreased urine volume and hematuria.  Musculoskeletal: Positive for arthralgias, back pain and myalgias. Negative for gait problem.  Skin: Positive for rash. Negative for wound.  Neurological: Negative for syncope, weakness and headaches.  Psychiatric/Behavioral: Positive for sleep disturbance. Negative for confusion. The patient is nervous/anxious.       Current Outpatient Medications on File Prior to Visit  Medication Sig Dispense Refill  . Acetaminophen (TYLENOL PO) Take 500 mg by mouth as needed.    . benztropine (COGENTIN) 0.5 MG tablet Take 0.5 mg by mouth 2 (two) times daily.     Marland Kitchen  buPROPion (WELLBUTRIN SR) 150 MG 12 hr tablet Take 150 mg by mouth 2 (two) times daily.    . hydrOXYzine (ATARAX/VISTARIL) 25 MG tablet Take 25 mg by mouth 3 (three) times  daily as needed.    . lamoTRIgine (LAMICTAL) 100 MG tablet Take 100 mg by mouth 2 (two) times daily.    Marland Kitchen lurasidone (LATUDA) 20 MG TABS tablet Take 20 mg by mouth every evening.    . Omega-3 Fatty Acids (FISH OIL) 1000 MG CAPS Take by mouth 3 (three) times a week.    Marland Kitchen omeprazole (PRILOSEC) 40 MG capsule Take 40 mg by mouth daily.    . traZODone (DESYREL) 50 MG tablet Take 50 mg by mouth at bedtime as needed for sleep.    . Vitamin D, Ergocalciferol, (DRISDOL) 50000 units CAPS capsule Take 50,000 Units by mouth every 7 (seven) days.     No current facility-administered medications on file prior to visit.      Past Medical History:  Diagnosis Date  . Anxiety   . Depression   . Fibromyalgia   . GERD (gastroesophageal reflux disease)   . History of cervical dysplasia   . History of exercise intolerance    normal ETT 10-24-2016  . Hyperlipidemia   . Hypertension   . Migraines   . Osteoarthritis   . PCOS (polycystic ovarian syndrome)   . Rash    in area of eswl 04-23-2017  . Right ureteral calculus   . Tachycardia cardiologist-  [name] harding   controlled w/ metoprolol   Allergies  Allergen Reactions  . Aspirin Shortness Of Breath and Other (See Comments)    Angiodema  . Contrast Media [Iodinated Diagnostic Agents] Anaphylaxis and Swelling    Facial swelling, throat swelling(per pt w/ pre-op protocol no issues)  . Ibuprofen Other (See Comments)    Angioedema  . Nsaids Anaphylaxis    Swelling of eyes mouth and throat difficulty breathing  . Iodine   . Naproxen     Family History  Problem Relation Age of Onset  . Diabetes Father   . Hypertension Father   . Arthritis Father   . Depression Father   . Hearing loss Father   . Diabetes Mother   . Hypertension Mother   . Arthritis Mother   . Depression Mother   . Asthma Paternal Grandmother   . Kidney disease Paternal Grandmother   . Arthritis Paternal Grandmother   . Cancer Paternal Grandmother   . COPD Paternal  Grandmother   . Hearing loss Paternal Grandmother   . Heart disease Paternal Grandmother   . Hypertension Paternal Grandmother   . Hyperlipidemia Paternal Grandmother   . Stroke Paternal Grandmother   . Heart attack Paternal Grandmother   . Depression Sister   . Emphysema Unknown   . Tongue cancer Unknown   . Coronary artery disease Unknown   . COPD Son   . Drug abuse Son   . Arthritis Maternal Grandmother   . COPD Maternal Grandmother   . Depression Maternal Grandmother   . Heart disease Maternal Grandmother   . Early death Maternal Grandfather   . Heart attack Maternal Grandfather   . COPD Paternal Grandfather   . Heart attack Paternal Grandfather   . Breast cancer Neg Hx     Social History   Socioeconomic History  . Marital status: Legally Separated    Spouse name: Not on file  . Number of children: 2  . Years of education: Not on file  . Highest  education level: Not on file  Occupational History  . Occupation: Unemployed  Social Needs  . Financial resource strain: Not on file  . Food insecurity:    Worry: Not on file    Inability: Not on file  . Transportation needs:    Medical: Not on file    Non-medical: Not on file  Tobacco Use  . Smoking status: Current Some Day Smoker    Packs/day: 0.25    Years: 25.00    Pack years: 6.25    Types: Cigarettes  . Smokeless tobacco: Never Used  . Tobacco comment: 6-7 cig. daily  Substance and Sexual Activity  . Alcohol use: No  . Drug use: No    Types: Cocaine    Comment: last cocaine use 2014  . Sexual activity: Not Currently    Partners: Male    Birth control/protection: None  Lifestyle  . Physical activity:    Days per week: Not on file    Minutes per session: Not on file  . Stress: Not on file  Relationships  . Social connections:    Talks on phone: Not on file    Gets together: Not on file    Attends religious service: Not on file    Active member of club or organization: Not on file    Attends meetings  of clubs or organizations: Not on file    Relationship status: Not on file  Other Topics Concern  . Not on file  Social History Narrative   She lives at home with her spouse. She does not exercise.   She currently denies recent recreational drug use.    Vitals:   03/25/18 1528  BP: 126/72  Pulse: 79  Resp: 12  Temp: 98.9 F (37.2 C)  SpO2: 98%    Body mass index is 34.48 kg/m.    Physical Exam  Nursing note and vitals reviewed. Constitutional: She is oriented to person, place, and time. She appears well-developed. No distress.  HENT:  Head: Normocephalic and atraumatic.  Mouth/Throat: Oropharynx is clear and moist and mucous membranes are normal.  Eyes: Pupils are equal, round, and reactive to light. Conjunctivae are normal.  Neck: No tracheal deviation present. Thyromegaly (palpable) present. No thyroid mass present.  Cardiovascular: Normal rate and regular rhythm.  No murmur heard. Pulses:      Dorsalis pedis pulses are 2+ on the right side, and 2+ on the left side.  Respiratory: Effort normal and breath sounds normal. No respiratory distress.  GI: Soft. She exhibits no mass. There is no hepatomegaly. There is no tenderness.  Musculoskeletal: She exhibits no edema or tenderness.  No trigger points.  Lymphadenopathy:    She has no cervical adenopathy.  Neurological: She is alert and oriented to person, place, and time. She has normal strength. Gait normal.  Skin: Skin is warm. There is erythema.  Nodular tender lesion left axilla.  No fluctuant area, about 2 cm diameter. Postinflammatory pigmentation changes on right axilla.   Psychiatric:  Well groomed, flat affect, good eye contact.    ASSESSMENT AND PLAN:  Ms. Rebbie was seen today for establish care.  Orders Placed This Encounter  Procedures  . Ambulatory referral to Pulmonology    Essential hypertension  Adequately controlled. No changes in current management. Low salt diet to continue. Eye exam  recommended annually. F/U in 6 months, before if needed.   Vitamin D deficiency, unspecified She will continue Ergocalciferol 50,000 units weekly. We will plan on rechecking vitamin D next  visit.   GERD Problem has been well controlled on omeprazole 40 mg daily. GERD precautions to continue. Follow-up in 12 months, before if needed.  Fatigue We discussed possible etiologies: Systemic illness, immunologic,endocrinology,sleep disorder, psychiatric/psychologic, infectious,medications side effects, and idiopathic.  Some of her medications and medical problems could be contributing to the problem. Examination today does not suggest a serious process. Healthy diet and regular physical activity may help. Because reporting recent lab work and obtain labs are needed today.  We will try to obtain lab results.    Fibromyalgia Educated about diagnosis and treatment options. She will continue Lyrica 225 mg twice daily, she was instructed to call 2 to 3 days in advance when she is running out of medication. We discussed some side effects. Good sleep hygiene and regular low impact physical activity. Follow-up in 3 months.  OSA on CPAP She is wearing CPAP. Referral to pulmonology was placed.  Sinus tachycardia Well-controlled on carvedilol 12.5 mg twice daily. She will continue monitoring for worrisome signs.  Forunculosis Doxycycline 100 mg twice daily for 10 days and then she will continue once daily for 30 days. We will consider extending antibiotic treatment for a few months depending on clinical response.     Jahon Bart G. Martinique, MD  [facility]. [place] office.

## 2018-03-25 NOTE — Assessment & Plan Note (Signed)
Educated about diagnosis and treatment options. She will continue Lyrica 225 mg twice daily, she was instructed to call 2 to 3 days in advance when she is running out of medication. We discussed some side effects. Good sleep hygiene and regular low impact physical activity. Follow-up in 3 months.

## 2018-03-25 NOTE — Patient Instructions (Addendum)
A few things to remember from today's visit:   Essential hypertension - Plan: carvedilol (COREG) 12.5 MG tablet  Sinus tachycardia - Plan: carvedilol (COREG) 12.5 MG tablet  Fibromyalgia - Plan: pregabalin (LYRICA) 225 MG capsule  Gastroesophageal reflux disease, esophagitis presence not specified   Myofascial Pain Syndrome and Fibromyalgia Myofascial pain syndrome and fibromyalgia are both pain disorders. This pain may be felt mainly in your muscles.  Myofascial pain syndrome: ? Always has trigger points or tender points in the muscle that will cause pain when pressed. The pain may come and go. ? Usually affects your neck, upper back, and shoulder areas. The pain often radiates into your arms and hands.  Fibromyalgia: ? Has muscle pains and tenderness that come and go. ? Is often associated with fatigue and sleep disturbances. ? Has trigger points. ? Tends to be long-lasting (chronic), but is not life-threatening.  Fibromyalgia and myofascial pain are not the same. However, they often occur together. If you have both conditions, each can make the other worse. Both are common and can cause enough pain and fatigue to make day-to-day activities difficult. What are the causes? The exact causes of fibromyalgia and myofascial pain are not known. People with certain gene types may be more likely to develop fibromyalgia. Some factors can be triggers for both conditions, such as:  Spine disorders.  Arthritis.  Severe injury (trauma) and other physical stressors.  Being under a lot of stress.  A medical illness.  What are the signs or symptoms? Fibromyalgia The main symptom of fibromyalgia is widespread pain and tenderness in your muscles. This can vary over time. Pain is sometimes described as stabbing, shooting, or burning. You may have tingling or numbness, too. You may also have sleep problems and fatigue. You may wake up feeling tired and groggy (fibro fog). Other symptoms may  include:  Bowel and bladder problems.  Headaches.  Visual problems.  Problems with odors and noises.  Depression or mood changes.  Painful menstrual periods (dysmenorrhea).  Dry skin or eyes.  Myofascial pain syndrome Symptoms of myofascial pain syndrome include:  Tight, ropy bands of muscle.  Uncomfortable sensations in muscular areas, such as: ? Aching. ? Cramping. ? Burning. ? Numbness. ? Tingling. ? Muscle weakness.  Trouble moving certain muscles freely (range of motion).  How is this diagnosed? There are no specific tests to diagnose fibromyalgia or myofascial pain syndrome. Both can be hard to diagnose because their symptoms are common in many other conditions. Your health care provider may suspect one or both of these conditions based on your symptoms and medical history. Your health care provider will also do a physical exam. The key to diagnosing fibromyalgia is having pain, fatigue, and other symptoms for more than three months that cannot be explained by another condition. The key to diagnosing myofascial pain syndrome is finding trigger points in muscles that are tender and cause pain elsewhere in your body (referred pain). How is this treated? Treating fibromyalgia and myofascial pain often requires a team of health care providers. This usually starts with your primary provider and a physical therapist. You may also find it helpful to work with alternative health care providers, such as massage therapists or acupuncturists. Treatment for fibromyalgia may include medicines. This may include nonsteroidal anti-inflammatory drugs (NSAIDs), along with other medicines. Treatment for myofascial pain may also include:  NSAIDs.  Cooling and stretching of muscles.  Trigger point injections.  Sound wave (ultrasound) treatments to stimulate muscles.  Follow these instructions  at home:  Take medicines only as directed by your health care provider.  Exercise as  directed by your health care provider or physical therapist.  Try to avoid stressful situations.  Practice relaxation techniques to control your stress. You may want to try: ? Biofeedback. ? Visual imagery. ? Hypnosis. ? Muscle relaxation. ? Yoga. ? Meditation.  Talk to your health care provider about alternative treatments, such as acupuncture or massage treatment.  Maintain a healthy lifestyle. This includes eating a healthy diet and getting enough sleep.  Consider joining a support group.  Do not do activities that stress or strain your muscles. That includes repetitive motions and heavy lifting. Where to find more information:  National Fibromyalgia Association: www.fmaware.[place]: www.arthritis.org  American Chronic Pain Association: [employer]EMDeals.dk Contact a health care provider if:  You have new symptoms.  Your symptoms get worse.  You have side effects from your medicines.  You have trouble sleeping.  Your condition is causing depression or anxiety. This information is not intended to replace advice given to you by your health care provider. Make sure you discuss any questions you have with your health care provider. Document Released: 11/06/2005 Document Revised: 04/13/2016 Document Reviewed: 08/12/2014 Elsevier Interactive Patient Education  2018 [employer].  Please be sure medication list is accurate. If a new problem present, please set up appointment sooner than planned today.

## 2018-03-25 NOTE — Assessment & Plan Note (Signed)
We discussed possible etiologies: Systemic illness, immunologic,endocrinology,sleep disorder, psychiatric/psychologic, infectious,medications side effects, and idiopathic.  Some of her medications and medical problems could be contributing to the problem. Examination today does not suggest a serious process. Healthy diet and regular physical activity may help. Because reporting recent lab work and obtain labs are needed today.  We will try to obtain lab results.

## 2018-03-25 NOTE — Assessment & Plan Note (Signed)
Doxycycline 100 mg twice daily for 10 days and then she will continue once daily for 30 days. We will consider extending antibiotic treatment for a few months depending on clinical response.

## 2018-03-27 ENCOUNTER — Ambulatory Visit: Payer: Self-pay | Admitting: Nurse Practitioner

## 2018-03-28 DIAGNOSIS — R69 Illness, unspecified: Secondary | ICD-10-CM | POA: Diagnosis not present

## 2018-03-29 ENCOUNTER — Encounter: Payer: Self-pay | Admitting: Family Medicine

## 2018-04-08 DIAGNOSIS — F603 Borderline personality disorder: Secondary | ICD-10-CM | POA: Diagnosis not present

## 2018-04-08 DIAGNOSIS — R69 Illness, unspecified: Secondary | ICD-10-CM | POA: Diagnosis not present

## 2018-04-09 ENCOUNTER — Other Ambulatory Visit: Payer: Self-pay | Admitting: Family Medicine

## 2018-04-09 DIAGNOSIS — M797 Fibromyalgia: Secondary | ICD-10-CM

## 2018-04-09 NOTE — Telephone Encounter (Signed)
Lyrica 225 mg refill request  LOV 03/25/18 with Dr. Martinique.  Last refill:  03/25/18   #60   0 refills  [facility]  - [place], [place].

## 2018-04-09 NOTE — Telephone Encounter (Signed)
Copied from [place] [id number]. Topic: Quick Communication - Rx Refill/Question >> Apr 09, 2018  9:43 AM Boyd Kerbs wrote: Medication:   pregabalin (LYRICA) 225 MG capsule  Has the patient contacted their pharmacy? Yes.   (Agent: If no, request that the patient contact the pharmacy for the refill.) (Agent: If yes, when and what did the pharmacy advise?)  Preferred Pharmacy (with phone number or street name):   [facility] - [place] - [address] [address] [place] [address] Phone: [phone] Fax: [phone]    Agent: Please be advised that RX refills may take up to 3 business days. We ask that you follow-up with your pharmacy.

## 2018-04-11 MED ORDER — PREGABALIN 225 MG PO CAPS: 225.0000 mg | ORAL_CAPSULE | Freq: Two times a day (BID) | ORAL | 2 refills | Status: DC

## 2018-04-19 DIAGNOSIS — G4733 Obstructive sleep apnea (adult) (pediatric): Secondary | ICD-10-CM | POA: Diagnosis not present

## 2018-04-22 DIAGNOSIS — F603 Borderline personality disorder: Secondary | ICD-10-CM | POA: Diagnosis not present

## 2018-04-22 DIAGNOSIS — R69 Illness, unspecified: Secondary | ICD-10-CM | POA: Diagnosis not present

## 2018-04-23 NOTE — Patient Instructions (Addendum)
Your procedure is scheduled on: Wednesday, June 19  Enter through the [employer] [facility] at: 7 am  Pick up the phone at the desk and dial [phone].  Call this number if you have problems the morning of surgery: [phone].  Remember: Do NOT eat food or Do NOT drink clear liquids (including water) after midnight Tuesday.  Take these medicines the morning of surgery with a SIP OF WATER: Omeprazole, Cogentin, Wellbutrin SR, Carvedilol, Lamictal, Lyrica  Hydroxyzine if needed  Do Not smoke on the day of surgery.  Stop herbal medications and supplements at this time.  Do NOT wear jewelry (body piercing), metal hair clips/bobby pins, make-up, or nail polish. Do NOT wear lotions, powders, or perfumes.  You may wear deoderant. Do NOT shave for 48 hours prior to surgery. Do NOT bring valuables to the hospital. Contacts, dentures, or bridgework may not be worn into surgery.  Leave suitcase in car.  After surgery it may be brought to your room.    For patients admitted to the hospital, checkout time is 11:00 AM the day of discharge.

## 2018-04-26 ENCOUNTER — Other Ambulatory Visit: Payer: Self-pay

## 2018-04-26 ENCOUNTER — Encounter (HOSPITAL_COMMUNITY)
Admission: RE | Admit: 2018-04-26 | Discharge: 2018-04-26 | Disposition: A | Discharge status: Home / Self Care | Payer: Medicare HMO | Source: Ambulatory Visit | Attending: Obstetrics and Gynecology | Admitting: Obstetrics and Gynecology

## 2018-04-26 ENCOUNTER — Encounter (HOSPITAL_COMMUNITY): Payer: Self-pay

## 2018-04-26 DIAGNOSIS — N898 Other specified noninflammatory disorders of vagina: Secondary | ICD-10-CM | POA: Diagnosis not present

## 2018-04-26 DIAGNOSIS — Z01810 Encounter for preprocedural cardiovascular examination: Secondary | ICD-10-CM | POA: Insufficient documentation

## 2018-04-26 DIAGNOSIS — N920 Excessive and frequent menstruation with regular cycle: Secondary | ICD-10-CM | POA: Diagnosis not present

## 2018-04-26 DIAGNOSIS — Z01812 Encounter for preprocedural laboratory examination: Secondary | ICD-10-CM | POA: Insufficient documentation

## 2018-04-26 HISTORY — DX: Pneumonia, unspecified organism: J18.9

## 2018-04-26 HISTORY — DX: Personal history of urinary calculi: Z87.442

## 2018-04-26 HISTORY — DX: Bipolar disorder, unspecified: F31.9

## 2018-04-26 HISTORY — DX: Chronic kidney disease, unspecified: N18.9

## 2018-04-26 LAB — CBC
HCT: 39.8 % (ref 36.0–46.0)
Hemoglobin: 13.3 g/dL (ref 12.0–15.0)
MCH: 28.2 pg (ref 26.0–34.0)
MCHC: 33.4 g/dL (ref 30.0–36.0)
MCV: 84.3 fL (ref 78.0–100.0)
PLATELETS: 353 10*3/uL (ref 150–400)
RBC: 4.72 MIL/uL (ref 3.87–5.11)
RDW: 14 % (ref 11.5–15.5)
WBC: 13.4 10*3/uL — ABNORMAL HIGH (ref 4.0–10.5)

## 2018-04-26 LAB — BASIC METABOLIC PANEL
Anion gap: 10 (ref 5–15)
BUN: 16 mg/dL (ref 6–20)
CALCIUM: 9 mg/dL (ref 8.9–10.3)
CO2: 22 mmol/L (ref 22–32)
CREATININE: 0.85 mg/dL (ref 0.44–1.00)
Chloride: 105 mmol/L (ref 101–111)
GFR calc Af Amer: >60 mL/min (ref >60)
GLUCOSE: 107 mg/dL — AB (ref 65–99)
Potassium: 3.7 mmol/L (ref 3.5–5.1)
Sodium: 137 mmol/L (ref 135–145)

## 2018-04-26 NOTE — Pre-Procedure Instructions (Signed)
Dr. Gifford Shave viewed EKG, aware of patient's history

## 2018-04-29 DIAGNOSIS — R69 Illness, unspecified: Secondary | ICD-10-CM | POA: Diagnosis not present

## 2018-04-30 DIAGNOSIS — N951 Menopausal and female climacteric states: Secondary | ICD-10-CM | POA: Diagnosis not present

## 2018-04-30 DIAGNOSIS — R635 Abnormal weight gain: Secondary | ICD-10-CM | POA: Diagnosis not present

## 2018-05-01 DIAGNOSIS — I10 Essential (primary) hypertension: Secondary | ICD-10-CM | POA: Diagnosis not present

## 2018-05-01 DIAGNOSIS — Z6834 Body mass index (BMI) 34.0-34.9, adult: Secondary | ICD-10-CM | POA: Diagnosis not present

## 2018-05-01 DIAGNOSIS — E559 Vitamin D deficiency, unspecified: Secondary | ICD-10-CM | POA: Diagnosis not present

## 2018-05-01 DIAGNOSIS — R740 Nonspecific elevation of levels of transaminase and lactic acid dehydrogenase [LDH]: Secondary | ICD-10-CM | POA: Diagnosis not present

## 2018-05-01 DIAGNOSIS — Z1339 Encounter for screening examination for other mental health and behavioral disorders: Secondary | ICD-10-CM | POA: Diagnosis not present

## 2018-05-01 DIAGNOSIS — M2550 Pain in unspecified joint: Secondary | ICD-10-CM | POA: Diagnosis not present

## 2018-05-01 DIAGNOSIS — Z1331 Encounter for screening for depression: Secondary | ICD-10-CM | POA: Diagnosis not present

## 2018-05-01 DIAGNOSIS — K219 Gastro-esophageal reflux disease without esophagitis: Secondary | ICD-10-CM | POA: Diagnosis not present

## 2018-05-01 DIAGNOSIS — R5383 Other fatigue: Secondary | ICD-10-CM | POA: Diagnosis not present

## 2018-05-06 DIAGNOSIS — R69 Illness, unspecified: Secondary | ICD-10-CM | POA: Diagnosis not present

## 2018-05-07 ENCOUNTER — Encounter (HOSPITAL_COMMUNITY): Payer: Self-pay

## 2018-05-07 DIAGNOSIS — G4733 Obstructive sleep apnea (adult) (pediatric): Secondary | ICD-10-CM | POA: Diagnosis not present

## 2018-05-07 NOTE — Anesthesia Preprocedure Evaluation (Addendum)
Anesthesia Evaluation  Patient identified by MRN, date of birth, ID band Patient awake    Reviewed: Allergy & Precautions, NPO status , Patient's Chart, lab work & pertinent test results, reviewed documented beta blocker date and time   Airway Mallampati: II  TM Distance: >3 FB Neck ROM: Full    Dental  (+) Teeth Intact, Caps   Pulmonary sleep apnea , pneumonia, resolved, Current Smoker,    Pulmonary exam normal breath sounds clear to auscultation       Cardiovascular hypertension, Pt. on home beta blockers and Pt. on medications Normal cardiovascular exam Rhythm:Regular Rate:Normal     Neuro/Psych  Headaches, PSYCHIATRIC DISORDERS Anxiety Depression Bipolar Disorder  Neuromuscular disease    GI/Hepatic GERD  Medicated and Controlled,(+)     substance abuse  cocaine use, Hx/o cocaine abuse   Endo/Other  Hypothyroidism Obesity  Renal/GU Renal diseaseHx/o renal calculi     Musculoskeletal  (+) Arthritis , Osteoarthritis,  Fibromyalgia -  Abdominal (+) + obese,   Peds  Hematology   Anesthesia Other Findings   Reproductive/Obstetrics Menorrhagia  Fibroid uterus                           Anesthesia Physical Anesthesia Plan  ASA: II  Anesthesia Plan: General   Post-op Pain Management:    Induction: Intravenous  PONV Risk Score and Plan: 4 or greater and Scopolamine patch - Pre-op, Midazolam, Dexamethasone, Ondansetron and Treatment may vary due to age or medical condition  Airway Management Planned: Oral ETT  Additional Equipment:   Intra-op Plan:   Post-operative Plan: Extubation in OR  Informed Consent: I have reviewed the patients History and Physical, chart, labs and discussed the procedure including the risks, benefits and alternatives for the proposed anesthesia with the patient or authorized representative who has indicated his/her understanding and acceptance.   Dental  advisory given  Plan Discussed with: CRNA and Surgeon  Anesthesia Plan Comments:        Anesthesia Quick Evaluation
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
## 2018-05-08 ENCOUNTER — Encounter (HOSPITAL_COMMUNITY): Payer: Self-pay | Admitting: Emergency Medicine

## 2018-05-08 ENCOUNTER — Other Ambulatory Visit: Payer: Self-pay

## 2018-05-08 ENCOUNTER — Ambulatory Visit (HOSPITAL_COMMUNITY): Payer: Medicare HMO | Admitting: Anesthesiology

## 2018-05-08 ENCOUNTER — Encounter (HOSPITAL_COMMUNITY)
Admission: RE | Disposition: A | Discharge status: Home / Self Care | Payer: Self-pay | Source: Ambulatory Visit | Attending: Obstetrics and Gynecology

## 2018-05-08 ENCOUNTER — Ambulatory Visit (HOSPITAL_COMMUNITY)
Admission: RE | Admit: 2018-05-08 | Discharge: 2018-05-09 | Disposition: A | Discharge status: Home / Self Care | Payer: Medicare HMO | Source: Ambulatory Visit | Attending: Obstetrics and Gynecology | Admitting: Obstetrics and Gynecology

## 2018-05-08 DIAGNOSIS — N920 Excessive and frequent menstruation with regular cycle: Secondary | ICD-10-CM | POA: Insufficient documentation

## 2018-05-08 DIAGNOSIS — Z6834 Body mass index (BMI) 34.0-34.9, adult: Secondary | ICD-10-CM | POA: Insufficient documentation

## 2018-05-08 DIAGNOSIS — D259 Leiomyoma of uterus, unspecified: Secondary | ICD-10-CM | POA: Insufficient documentation

## 2018-05-08 DIAGNOSIS — I10 Essential (primary) hypertension: Secondary | ICD-10-CM | POA: Insufficient documentation

## 2018-05-08 DIAGNOSIS — Z886 Allergy status to analgesic agent status: Secondary | ICD-10-CM | POA: Diagnosis not present

## 2018-05-08 DIAGNOSIS — Z8249 Family history of ischemic heart disease and other diseases of the circulatory system: Secondary | ICD-10-CM | POA: Insufficient documentation

## 2018-05-08 DIAGNOSIS — Z981 Arthrodesis status: Secondary | ICD-10-CM | POA: Diagnosis not present

## 2018-05-08 DIAGNOSIS — E282 Polycystic ovarian syndrome: Secondary | ICD-10-CM | POA: Diagnosis not present

## 2018-05-08 DIAGNOSIS — N9089 Other specified noninflammatory disorders of vulva and perineum: Secondary | ICD-10-CM | POA: Diagnosis not present

## 2018-05-08 DIAGNOSIS — M1990 Unspecified osteoarthritis, unspecified site: Secondary | ICD-10-CM | POA: Diagnosis not present

## 2018-05-08 DIAGNOSIS — N9489 Other specified conditions associated with female genital organs and menstrual cycle: Secondary | ICD-10-CM | POA: Insufficient documentation

## 2018-05-08 DIAGNOSIS — N907 Vulvar cyst: Secondary | ICD-10-CM | POA: Diagnosis not present

## 2018-05-08 DIAGNOSIS — K219 Gastro-esophageal reflux disease without esophagitis: Secondary | ICD-10-CM | POA: Insufficient documentation

## 2018-05-08 DIAGNOSIS — G4730 Sleep apnea, unspecified: Secondary | ICD-10-CM | POA: Diagnosis not present

## 2018-05-08 DIAGNOSIS — E039 Hypothyroidism, unspecified: Secondary | ICD-10-CM | POA: Insufficient documentation

## 2018-05-08 DIAGNOSIS — E669 Obesity, unspecified: Secondary | ICD-10-CM | POA: Insufficient documentation

## 2018-05-08 DIAGNOSIS — Z79899 Other long term (current) drug therapy: Secondary | ICD-10-CM | POA: Diagnosis not present

## 2018-05-08 DIAGNOSIS — Z90710 Acquired absence of both cervix and uterus: Secondary | ICD-10-CM | POA: Diagnosis present

## 2018-05-08 DIAGNOSIS — N838 Other noninflammatory disorders of ovary, fallopian tube and broad ligament: Secondary | ICD-10-CM | POA: Insufficient documentation

## 2018-05-08 DIAGNOSIS — F17210 Nicotine dependence, cigarettes, uncomplicated: Secondary | ICD-10-CM | POA: Insufficient documentation

## 2018-05-08 DIAGNOSIS — N946 Dysmenorrhea, unspecified: Secondary | ICD-10-CM | POA: Diagnosis not present

## 2018-05-08 DIAGNOSIS — F319 Bipolar disorder, unspecified: Secondary | ICD-10-CM | POA: Diagnosis not present

## 2018-05-08 DIAGNOSIS — M797 Fibromyalgia: Secondary | ICD-10-CM | POA: Diagnosis not present

## 2018-05-08 DIAGNOSIS — E785 Hyperlipidemia, unspecified: Secondary | ICD-10-CM | POA: Diagnosis not present

## 2018-05-08 DIAGNOSIS — R69 Illness, unspecified: Secondary | ICD-10-CM | POA: Diagnosis not present

## 2018-05-08 HISTORY — PX: LAPAROSCOPIC VAGINAL HYSTERECTOMY WITH SALPINGECTOMY: SHX6680

## 2018-05-08 HISTORY — PX: CYSTOSCOPY: SHX5120

## 2018-05-08 LAB — TYPE AND SCREEN
ABO/RH(D): A POS
Antibody Screen: NEGATIVE

## 2018-05-08 SURGERY — HYSTERECTOMY, VAGINAL, LAPAROSCOPY-ASSISTED, WITH SALPINGECTOMY
Anesthesia: General | Site: Bladder

## 2018-05-08 MED ORDER — PROPOFOL 10 MG/ML IV BOLUS
INTRAVENOUS | Status: AC
  Filled 2018-05-08: qty 20

## 2018-05-08 MED ORDER — PROMETHAZINE HCL 25 MG/ML IJ SOLN
INTRAMUSCULAR | Status: AC
  Filled 2018-05-08: qty 1

## 2018-05-08 MED ORDER — METOCLOPRAMIDE HCL 5 MG/ML IJ SOLN
INTRAMUSCULAR | Status: AC
  Administered 2018-05-08: 10 mg via INTRAVENOUS
  Filled 2018-05-08: qty 2

## 2018-05-08 MED ORDER — DEXAMETHASONE SODIUM PHOSPHATE 4 MG/ML IJ SOLN
INTRAMUSCULAR | Status: AC
  Filled 2018-05-08: qty 1

## 2018-05-08 MED ORDER — ONDANSETRON HCL 4 MG/2ML IJ SOLN: 4.0000 mg | Freq: Four times a day (QID) | INTRAMUSCULAR | Status: DC | PRN

## 2018-05-08 MED ORDER — ONDANSETRON HCL 4 MG PO TABS: 4.0000 mg | ORAL_TABLET | Freq: Four times a day (QID) | ORAL | Status: DC | PRN

## 2018-05-08 MED ORDER — ONDANSETRON HCL 4 MG/2ML IJ SOLN
INTRAMUSCULAR | Status: AC
  Filled 2018-05-08: qty 2

## 2018-05-08 MED ORDER — SCOPOLAMINE 1 MG/3DAYS TD PT72
1.0000 | MEDICATED_PATCH | Freq: Once | TRANSDERMAL | Status: DC
  Administered 2018-05-08: 1.5 mg via TRANSDERMAL

## 2018-05-08 MED ORDER — LIDOCAINE HCL (CARDIAC) PF 100 MG/5ML IV SOSY
PREFILLED_SYRINGE | INTRAVENOUS | Status: DC | PRN
  Administered 2018-05-08: 60 mg via INTRAVENOUS

## 2018-05-08 MED ORDER — HYDROMORPHONE HCL 1 MG/ML IJ SOLN
INTRAMUSCULAR | Status: DC | PRN
  Administered 2018-05-08: .5 mg via INTRAVENOUS
  Administered 2018-05-08: 0.5 mg via INTRAVENOUS
  Administered 2018-05-08: .5 mg via INTRAVENOUS
  Administered 2018-05-08: 0.5 mg via INTRAVENOUS

## 2018-05-08 MED ORDER — MISOPROSTOL 200 MCG PO TABS
ORAL_TABLET | ORAL | Status: AC
  Filled 2018-05-08: qty 4

## 2018-05-08 MED ORDER — BENZTROPINE MESYLATE 0.5 MG PO TABS
0.5000 mg | ORAL_TABLET | Freq: Two times a day (BID) | ORAL | Status: DC
  Administered 2018-05-08: 0.5 mg via ORAL
  Filled 2018-05-08 (×2): qty 1

## 2018-05-08 MED ORDER — MIDAZOLAM HCL 2 MG/2ML IJ SOLN
INTRAMUSCULAR | Status: AC
  Filled 2018-05-08: qty 2

## 2018-05-08 MED ORDER — ACETAMINOPHEN 10 MG/ML IV SOLN
INTRAVENOUS | Status: DC | PRN
  Administered 2018-05-08: 1000 mg via INTRAVENOUS

## 2018-05-08 MED ORDER — CARVEDILOL 12.5 MG PO TABS
12.5000 mg | ORAL_TABLET | Freq: Two times a day (BID) | ORAL | Status: DC | PRN
  Filled 2018-05-08: qty 2

## 2018-05-08 MED ORDER — FENTANYL CITRATE (PF) 100 MCG/2ML IJ SOLN
INTRAMUSCULAR | Status: DC | PRN
  Administered 2018-05-08: 100 ug via INTRAVENOUS
  Administered 2018-05-08 (×3): 50 ug via INTRAVENOUS

## 2018-05-08 MED ORDER — FENTANYL CITRATE (PF) 250 MCG/5ML IJ SOLN
INTRAMUSCULAR | Status: AC
  Filled 2018-05-08: qty 5

## 2018-05-08 MED ORDER — SODIUM CHLORIDE 0.9 % IR SOLN
Status: DC | PRN
  Administered 2018-05-08: 3000 mL

## 2018-05-08 MED ORDER — LACTATED RINGERS IV SOLN: INTRAVENOUS | Status: DC

## 2018-05-08 MED ORDER — PREGABALIN 75 MG PO CAPS
225.0000 mg | ORAL_CAPSULE | Freq: Two times a day (BID) | ORAL | Status: DC
  Administered 2018-05-08: 225 mg via ORAL
  Filled 2018-05-08: qty 3

## 2018-05-08 MED ORDER — ESTRADIOL 0.1 MG/GM VA CREA
TOPICAL_CREAM | VAGINAL | Status: AC
  Filled 2018-05-08: qty 42.5

## 2018-05-08 MED ORDER — STERILE WATER FOR IRRIGATION IR SOLN
Status: DC | PRN
  Administered 2018-05-08: 1000 mL

## 2018-05-08 MED ORDER — VASOPRESSIN 20 UNIT/ML IV SOLN
INTRAVENOUS | Status: AC
  Filled 2018-05-08: qty 1

## 2018-05-08 MED ORDER — SUGAMMADEX SODIUM 200 MG/2ML IV SOLN
INTRAVENOUS | Status: AC
  Filled 2018-05-08: qty 2

## 2018-05-08 MED ORDER — MENTHOL 3 MG MT LOZG: 1.0000 | LOZENGE | OROMUCOSAL | Status: DC | PRN

## 2018-05-08 MED ORDER — MEPERIDINE HCL 25 MG/ML IJ SOLN: 6.2500 mg | INTRAMUSCULAR | Status: DC | PRN

## 2018-05-08 MED ORDER — SUGAMMADEX SODIUM 200 MG/2ML IV SOLN
INTRAVENOUS | Status: DC | PRN
  Administered 2018-05-08: 200 mg via INTRAVENOUS

## 2018-05-08 MED ORDER — BUPIVACAINE HCL (PF) 0.25 % IJ SOLN
INTRAMUSCULAR | Status: DC | PRN
  Administered 2018-05-08: 14 mL

## 2018-05-08 MED ORDER — ROCURONIUM BROMIDE 100 MG/10ML IV SOLN
INTRAVENOUS | Status: DC | PRN
  Administered 2018-05-08: 50 mg via INTRAVENOUS
  Administered 2018-05-08: 10 mg via INTRAVENOUS

## 2018-05-08 MED ORDER — LACTATED RINGERS IV SOLN
INTRAVENOUS | Status: DC
  Administered 2018-05-08 (×3): via INTRAVENOUS

## 2018-05-08 MED ORDER — DEXAMETHASONE SODIUM PHOSPHATE 10 MG/ML IJ SOLN
INTRAMUSCULAR | Status: DC | PRN
  Administered 2018-05-08: 4 mg via INTRAVENOUS

## 2018-05-08 MED ORDER — LAMOTRIGINE 100 MG PO TABS
100.0000 mg | ORAL_TABLET | Freq: Two times a day (BID) | ORAL | Status: DC
  Administered 2018-05-08: 100 mg via ORAL
  Filled 2018-05-08 (×2): qty 1

## 2018-05-08 MED ORDER — VASOPRESSIN 20 UNIT/ML IV SOLN
INTRAVENOUS | Status: DC | PRN
  Administered 2018-05-08: 10 mL via INTRAMUSCULAR

## 2018-05-08 MED ORDER — EPHEDRINE 5 MG/ML INJ
INTRAVENOUS | Status: AC
  Filled 2018-05-08: qty 10

## 2018-05-08 MED ORDER — METOCLOPRAMIDE HCL 5 MG/ML IJ SOLN
10.0000 mg | Freq: Once | INTRAMUSCULAR | Status: AC | PRN
  Administered 2018-05-08: 10 mg via INTRAVENOUS

## 2018-05-08 MED ORDER — OXYCODONE HCL 5 MG PO TABS
5.0000 mg | ORAL_TABLET | ORAL | Status: DC | PRN
  Administered 2018-05-08 – 2018-05-09 (×4): 5 mg via ORAL
  Filled 2018-05-08 (×4): qty 1

## 2018-05-08 MED ORDER — HYDROMORPHONE HCL 1 MG/ML IJ SOLN
INTRAMUSCULAR | Status: AC
  Administered 2018-05-08: 0.5 mg via INTRAVENOUS
  Filled 2018-05-08: qty 1

## 2018-05-08 MED ORDER — ROCURONIUM BROMIDE 100 MG/10ML IV SOLN
INTRAVENOUS | Status: AC
  Filled 2018-05-08: qty 1

## 2018-05-08 MED ORDER — LURASIDONE HCL 20 MG PO TABS
20.0000 mg | ORAL_TABLET | Freq: Every evening | ORAL | Status: DC
  Administered 2018-05-08: 20 mg via ORAL
  Filled 2018-05-08: qty 1

## 2018-05-08 MED ORDER — HYDROMORPHONE HCL 1 MG/ML IJ SOLN: 0.2000 mg | INTRAMUSCULAR | Status: DC | PRN

## 2018-05-08 MED ORDER — PANTOPRAZOLE SODIUM 40 MG PO TBEC
40.0000 mg | DELAYED_RELEASE_TABLET | Freq: Every day | ORAL | Status: DC
  Administered 2018-05-08: 40 mg via ORAL
  Filled 2018-05-08: qty 1

## 2018-05-08 MED ORDER — HYDROMORPHONE HCL 1 MG/ML IJ SOLN
INTRAMUSCULAR | Status: AC
  Filled 2018-05-08: qty 1

## 2018-05-08 MED ORDER — FLUORESCEIN SODIUM 10 % IV SOLN
INTRAVENOUS | Status: DC | PRN
  Administered 2018-05-08: 1 mL via INTRAVENOUS

## 2018-05-08 MED ORDER — LIDOCAINE HCL (CARDIAC) PF 100 MG/5ML IV SOSY
PREFILLED_SYRINGE | INTRAVENOUS | Status: AC
  Filled 2018-05-08: qty 5

## 2018-05-08 MED ORDER — HYDROXYZINE HCL 25 MG PO TABS
25.0000 mg | ORAL_TABLET | Freq: Three times a day (TID) | ORAL | Status: DC
  Administered 2018-05-08: 25 mg via ORAL
  Filled 2018-05-08 (×3): qty 1

## 2018-05-08 MED ORDER — FLUORESCEIN SODIUM 10 % IV SOLN
INTRAVENOUS | Status: AC
  Filled 2018-05-08: qty 5

## 2018-05-08 MED ORDER — ACETAMINOPHEN 325 MG PO TABS
650.0000 mg | ORAL_TABLET | ORAL | Status: DC | PRN
  Administered 2018-05-08: 650 mg via ORAL
  Filled 2018-05-08: qty 2

## 2018-05-08 MED ORDER — BUPROPION HCL ER (SR) 150 MG PO TB12
150.0000 mg | ORAL_TABLET | Freq: Two times a day (BID) | ORAL | Status: DC
  Administered 2018-05-08: 150 mg via ORAL
  Filled 2018-05-08 (×2): qty 1

## 2018-05-08 MED ORDER — ACETAMINOPHEN 10 MG/ML IV SOLN
INTRAVENOUS | Status: AC
  Filled 2018-05-08: qty 100

## 2018-05-08 MED ORDER — PROPOFOL 10 MG/ML IV BOLUS
INTRAVENOUS | Status: DC | PRN
  Administered 2018-05-08: 200 mg via INTRAVENOUS

## 2018-05-08 MED ORDER — BUPIVACAINE HCL (PF) 0.25 % IJ SOLN
INTRAMUSCULAR | Status: AC
  Filled 2018-05-08: qty 30

## 2018-05-08 MED ORDER — CEFAZOLIN SODIUM-DEXTROSE 2-4 GM/100ML-% IV SOLN
2.0000 g | INTRAVENOUS | Status: AC
  Administered 2018-05-08: 2 g via INTRAVENOUS

## 2018-05-08 MED ORDER — SODIUM CHLORIDE 0.9 % IJ SOLN
INTRAMUSCULAR | Status: AC
  Filled 2018-05-08: qty 100

## 2018-05-08 MED ORDER — MAGNESIUM HYDROXIDE 400 MG/5ML PO SUSP: 30.0000 mL | Freq: Every day | ORAL | Status: DC | PRN

## 2018-05-08 MED ORDER — MIDAZOLAM HCL 2 MG/2ML IJ SOLN
INTRAMUSCULAR | Status: DC | PRN
  Administered 2018-05-08: 2 mg via INTRAVENOUS

## 2018-05-08 MED ORDER — SCOPOLAMINE 1 MG/3DAYS TD PT72
MEDICATED_PATCH | TRANSDERMAL | Status: AC
  Administered 2018-05-08: 1.5 mg via TRANSDERMAL
  Filled 2018-05-08: qty 1

## 2018-05-08 MED ORDER — SODIUM CHLORIDE 0.9 % IJ SOLN
INTRAMUSCULAR | Status: AC
  Filled 2018-05-08: qty 10

## 2018-05-08 MED ORDER — SIMETHICONE 80 MG PO CHEW: 80.0000 mg | CHEWABLE_TABLET | Freq: Four times a day (QID) | ORAL | Status: DC | PRN

## 2018-05-08 MED ORDER — HYDROMORPHONE HCL 1 MG/ML IJ SOLN
0.2500 mg | INTRAMUSCULAR | Status: DC | PRN
  Administered 2018-05-08 (×3): 0.5 mg via INTRAVENOUS

## 2018-05-08 MED ORDER — EPHEDRINE SULFATE 50 MG/ML IJ SOLN
INTRAMUSCULAR | Status: DC | PRN
  Administered 2018-05-08: 10 mg via INTRAVENOUS

## 2018-05-08 MED ORDER — ONDANSETRON HCL 4 MG/2ML IJ SOLN
INTRAMUSCULAR | Status: DC | PRN
  Administered 2018-05-08: 4 mg via INTRAVENOUS

## 2018-05-08 SURGICAL SUPPLY — 42 items
ADH SKN CLS LQ APL DERMABOND (GAUZE/BANDAGES/DRESSINGS) ×2
APL SRG 38 LTWT LNG FL B (MISCELLANEOUS) ×2
APPLICATOR ARISTA FLEXITIP XL (MISCELLANEOUS) ×3 IMPLANT
CABLE HIGH FREQUENCY MONO STRZ (ELECTRODE) IMPLANT
CONT PATH 16OZ SNAP LID 3702 (MISCELLANEOUS) ×3 IMPLANT
COVER BACK TABLE 60X90IN (DRAPES) ×3 IMPLANT
COVER MAYO STAND STRL (DRAPES) ×3 IMPLANT
DECANTER SPIKE VIAL GLASS SM (MISCELLANEOUS) ×6 IMPLANT
DERMABOND ADHESIVE PROPEN (GAUZE/BANDAGES/DRESSINGS) ×1
DERMABOND ADVANCED .7 DNX6 (GAUZE/BANDAGES/DRESSINGS) ×2 IMPLANT
DRSG OPSITE POSTOP 3X4 (GAUZE/BANDAGES/DRESSINGS) ×3 IMPLANT
DURAPREP 26ML APPLICATOR (WOUND CARE) ×3 IMPLANT
ELECT REM PT RETURN 9FT ADLT (ELECTROSURGICAL) ×3
ELECTRODE REM PT RTRN 9FT ADLT (ELECTROSURGICAL) ×2 IMPLANT
FILTER SMOKE EVAC LAPAROSHD (FILTER) IMPLANT
GLOVE BIO SURGEON STRL SZ 6.5 (GLOVE) ×9 IMPLANT
GLOVE BIOGEL PI IND STRL 6.5 (GLOVE) ×2 IMPLANT
GLOVE BIOGEL PI IND STRL 7.0 (GLOVE) ×4 IMPLANT
GLOVE BIOGEL PI INDICATOR 6.5 (GLOVE) ×1
GLOVE BIOGEL PI INDICATOR 7.0 (GLOVE) ×2
HEMOSTAT ARISTA ABSORB 3G PWDR (MISCELLANEOUS) ×3 IMPLANT
LEGGING LITHOTOMY PAIR STRL (DRAPES) ×3 IMPLANT
NEEDLE INSUFFLATION 120MM (ENDOMECHANICALS) ×3 IMPLANT
NS IRRIG 1000ML POUR BTL (IV SOLUTION) ×3 IMPLANT
PACK LAVH (CUSTOM PROCEDURE TRAY) ×3 IMPLANT
PACK ROBOTIC GOWN (GOWN DISPOSABLE) ×3 IMPLANT
PACK TRENDGUARD 450 HYBRID PRO (MISCELLANEOUS) ×2 IMPLANT
PROTECTOR NERVE ULNAR (MISCELLANEOUS) ×6 IMPLANT
SET IRRIG TUBING LAPAROSCOPIC (IRRIGATION / IRRIGATOR) ×3 IMPLANT
SHEARS HARMONIC ACE PLUS 36CM (ENDOMECHANICALS) ×3 IMPLANT
SLEEVE XCEL OPT CAN 5 100 (ENDOMECHANICALS) ×6 IMPLANT
SUT VIC AB 0 CT1 18XCR BRD8 (SUTURE) ×4 IMPLANT
SUT VIC AB 0 CT1 8-18 (SUTURE) ×6
SUT VIC AB 2-0 CT1 (SUTURE) ×6 IMPLANT
SUT VICRYL 0 TIES 12 18 (SUTURE) ×3 IMPLANT
SUT VICRYL 4-0 PS2 18IN ABS (SUTURE) ×6 IMPLANT
TOWEL OR 17X24 6PK STRL BLUE (TOWEL DISPOSABLE) ×6 IMPLANT
TRAY FOLEY W/BAG SLVR 14FR (SET/KITS/TRAYS/PACK) ×3 IMPLANT
TRENDGUARD 450 HYBRID PRO PACK (MISCELLANEOUS) ×3
TROCAR BALLN 12MMX100 BLUNT (TROCAR) ×3 IMPLANT
TROCAR XCEL NON-BLD 5MMX100MML (ENDOMECHANICALS) ×3 IMPLANT
WARMER LAPAROSCOPE (MISCELLANEOUS) ×3 IMPLANT

## 2018-05-08 NOTE — Progress Notes (Signed)
Day of Surgery Procedure(s) (LRB): LAPAROSCOPIC ASSISTED VAGINAL HYSTERECTOMY WITH SALPINGECTOMY,  I & D Sebaceous Left Labia (Bilateral) CYSTOSCOPY (N/A)  Subjective: Patient reports tolerating PO.  Pain is minimal  Objective: I have reviewed patient's vital signs and intake and output.  General: alert and cooperative GI: soft, NT  Assessment: s/p Procedure(s): LAPAROSCOPIC ASSISTED VAGINAL HYSTERECTOMY WITH SALPINGECTOMY,  I & D Sebaceous Left Labia (Bilateral) CYSTOSCOPY (N/A): stable  Plan: Advance diet Discontinue IV fluids  Will d/c foley this PM if ambulating well.   LOS: 0 days    Logan Bores 05/08/2018, 4:49 PM

## 2018-05-08 NOTE — Progress Notes (Signed)
Patient ID: [name] [name], female   DOB: [birth year], 47 y.o.   MRN: [id number] Per pt no changes in dictated H&P.  BRief exam WNL.  Ready to proceed.

## 2018-05-08 NOTE — Anesthesia Postprocedure Evaluation (Signed)
Anesthesia Post Note  Patient: [name] [name]  Procedure(s) Performed: LAPAROSCOPIC ASSISTED VAGINAL HYSTERECTOMY WITH SALPINGECTOMY,  I & D Sebaceous Left Labia (Bilateral Abdomen) CYSTOSCOPY (N/A Bladder)     Patient location during evaluation: PACU Anesthesia Type: General Level of consciousness: awake and alert and oriented Pain management: pain level controlled Vital Signs Assessment: post-procedure vital signs reviewed and stable Respiratory status: spontaneous breathing, nonlabored ventilation, respiratory function stable and patient connected to nasal cannula oxygen Cardiovascular status: blood pressure returned to baseline and stable Postop Assessment: no apparent nausea or vomiting Anesthetic complications: no    Last Vitals:  Vitals:   05/08/18 1315 05/08/18 1410  BP: 129/68 121/79  Pulse: 90 87  Resp: 18 18  Temp: 36.9 C 36.8 C  SpO2: 99% 100%    Last Pain:  Vitals:   05/08/18 1410  TempSrc: Oral  PainSc:    Pain Goal: Patients Stated Pain Goal: 5 (05/08/18 1315)               Victory Dresden A.

## 2018-05-08 NOTE — Discharge Summary (Signed)
Physician Discharge Summary  Patient ID: [name] [name] MRN: [id number] DOB/AGE: Jan 03, 1971 47 y.o.  Admit date: 05/08/2018 Discharge date: 05/09/2018  Admission Diagnoses: Menorrhagia Dysmenorrhea  Discharge Diagnoses:  Active Problems:   S/P laparoscopic assisted vaginal hysterectomy (LAVH) with bilateral salpingectomies and cystoscopy  Discharged Condition: good  Hospital Course: Pt admitted to observation s/p LVH/BS and did well.  By POD #1 she was tolerating regular diet, ambulating and voiding without problem and was d/c to home.  Consults: None  Significant Diagnostic Studies: labs: CBC  Treatments: surgery as above  Discharge Exam: Blood pressure 108/72, pulse 73, temperature 99.2 F (37.3 C), temperature source Oral, resp. rate 18, height 5\' 7"  (1.702 m), weight 100.7 kg (222 lb), last menstrual period 04/30/2018, SpO2 100 %. General appearance: alert and cooperative GI: soft NT  Incisions approximated with ecchymoses Disposition: Discharge disposition: 01-Home or Self Care       Discharge Instructions    Call MD for:  persistant nausea and vomiting   Complete by:  As directed    Call MD for:  redness, tenderness, or signs of infection (pain, swelling, redness, odor or green/yellow discharge around incision site)   Complete by:  As directed    Call MD for:  severe uncontrolled pain   Complete by:  As directed    Call MD for:  temperature >100.4   Complete by:  As directed    Diet - low sodium heart healthy   Complete by:  As directed    Discharge instructions   Complete by:  As directed    Avoid driving for at least 1-2 weeks or until off narcotic pain meds.  No heavy lifting greater than 10 lbs.  Nothing in vagina for 6 weeks.  May remove bandage in 1-2 days.  Shower over incision and pat dry.     Allergies as of 05/09/2018      Reactions   Aspirin Shortness Of Breath, Other (See Comments)   Angiodema   Nsaids Anaphylaxis   Swelling of eyes mouth  and throat difficulty breathing   Naproxen       Medication List    STOP taking these medications   doxycycline 100 MG tablet Commonly known as:  VIBRA-TABS     TAKE these medications   acetaminophen 325 MG tablet Commonly known as:  TYLENOL Take 2 tablets (650 mg total) by mouth every 4 (four) hours as needed for mild pain or moderate pain. What changed:    medication strength  how much to take  when to take this  reasons to take this   benztropine 0.5 MG tablet Commonly known as:  COGENTIN Take 0.5 mg by mouth 2 (two) times daily.   buPROPion 150 MG 12 hr tablet Commonly known as:  WELLBUTRIN SR Take 150 mg by mouth 2 (two) times daily.   carvedilol 12.5 MG tablet Commonly known as:  COREG Take 1 tablet (12.5 mg total) by mouth 2 (two) times daily with a meal. What changed:    how much to take  when to take this   Fish Oil 1000 MG Caps Take by mouth 3 (three) times daily.   hydrOXYzine 25 MG tablet Commonly known as:  ATARAX/VISTARIL Take 25 mg by mouth 3 (three) times daily.   lamoTRIgine 100 MG tablet Commonly known as:  LAMICTAL Take 100 mg by mouth 2 (two) times daily.   LATUDA 20 MG Tabs tablet Generic drug:  lurasidone Take 20 mg by mouth every evening.  omeprazole 40 MG capsule Commonly known as:  PRILOSEC Take 40 mg by mouth daily.   oxyCODONE 5 MG immediate release tablet Commonly known as:  Oxy IR/ROXICODONE Take 1 tablet (5 mg total) by mouth every 3 (three) hours as needed for moderate pain.   pregabalin 225 MG capsule Commonly known as:  LYRICA Take 1 capsule (225 mg total) by mouth 2 (two) times daily.   PRENATAL VITAMIN PO Take 1 tablet by mouth at bedtime.   traZODone 50 MG tablet Commonly known as:  DESYREL Take 25-75 mg by mouth at bedtime as needed for sleep.   Vitamin D (Ergocalciferol) 50000 units Caps capsule Commonly known as:  DRISDOL Take 50,000 Units by mouth every 7 (seven) days. On Mondays      Follow-up  Information    Paula Compton, MD. Schedule an appointment as soon as possible for a visit in 2 week(s).   Specialty:  Obstetrics and Gynecology Why:  incision check Contact information: [address] [place] [address] [phone]           Signed: Logan Bores 05/09/2018, 8:31 AM

## 2018-05-08 NOTE — Op Note (Signed)
Operative Note    Preoperative Diagnosis Menorrhagia Dysmenorrhea Labial lesion  Postoperative Diagnosis same  Procedure Laparoscopic assisted vaginal hysterectomy/Bilateral salpingectomies Cystoscopy Drainage of small left labial cyst  Surgeon Paula Compton, MD Carlynn Purl, DO  Anesthesia GETA  Fluids: EBL 357mL UOP 258mL clear IVF 1852mL   Findings Uterus enlarged and boggy 9-10 week size.  Ovaries and tubes normal in appearance as well as remainder of abdomen and pelvis.  Very small sebaceous cyst in left labial fold. Drained of small amount of milky fluid   Specimen Uterus, cervix, bilateral fallopian tubes  Procedure Note Patient was taken to the operating room where general anesthesia was obtained without difficulty. She was then prepped and draped in the normal sterile fashion in the dorsal lithotomy position. An appropriate timeout was performed. A speculum was then placed within the vagina and a Hulka tenaculum placed within the cervix for uterine manipulation. A foley catheter was placed in the bladder.  Attention was then turned to the patient's abdomen after draping where the infraumbilical area was injected with approximately 10 cc of quarter percent MarcainePatient was taken to the operating room where general anesthesia was obtained without difficulty she was prepped and draped in the normal sterile fashion in the dorsal lithotomy position. An appropriate time out was performed. A Hulka tenaculum was placed within cervix and a Foley catheter in the bladder. Attention was then turned to the abdomen where of infraumbilical incision was made after injection with quarter percent Marcaine approximate 2 cm in width. The fascia was then identified and grasped with Coker clamps and elevated. The fascia was then opened sharply with Mayo scissors and intraperitoneal placement of the Sheryle Hail was performed, after a pursestring suture of 0 Vicryl was placed around the  fascial edge. With the [facility] in place the pneumoperitoneum was obtained and the pelvis and abdomen inspected with findings as previously stated. Two additional 5 mm ports were placed under direct visualization from the lateral upper quadrants. Each port site was injected with quarter percent Marcaine prior placement.   With patient in Trendelenburg the uterus and tubes and ovaries were inspected with findings as previously stated. Two additional trocars were placed in the upper lateral quadrants under direct visualization after injection with quarter percent marcaine.  The Harmonic scalpel was then utilized to dissect the fallopian tubes from the mesosalpinx bilaterally down to the level of the cornua.  The remainder of the uteroovarian ligament and the round ligament were then also taken down with the Harmonic to the level of the bladder flap.  The bladder flap was taken down from the lower uterine segment and pushed away to expose the cervix.  An area of bleeding at the right uteroovarian pedicle was cauterized.  Attention was then turned to the vagina after all instruments were removed and the trocars covered with a sterile drape.  The cervix was grasped with Yates Decamp tenaculums x 2 and injected with a dilute solution of Pitressin circumferentially.  The bovie was then used to make a circumferential incision.  The mayo scissors then further dissected the vaginal mucosa from the underlying cervix and the anterior and posterior cul de sac entered sharply.  With a banana speculum and deaver retractor isolating the uterus from the bladder and rectum.  The uterosacral ligaments and paracervical tissue was taken down sequentially with parametrial clamps and suture ligated with zero vicryl at each step.  When  the was uterus freed on the patient's right, it was then delivered and the remaining tissue  on the left clamped and transected completely freeing the uterus and tubes.  It was handed off to pathology. It was  noted that there was more than expected bleeding that appeared to be coming from higher in the abdomen.  THe peritoneum was closed with a pursestring suture of 2-0 vicryl and a glove filled with saline placed into the vagina so pneumoperitoneum could be re-obtained.  My assistant, Dr. Terri Piedra changed gowns and went to the top and reintroduced the scope to assess the bleeding.  THere was some oozing from the right ovary, but nothing briskly bleeding, therefore the glove was removed to complete the cuff closure.  The uterosacral ligaments were approximated with zero vicryl.  The short weighted speculum was placed and the cuff run with a running locked 2-0 vicryl for hemostasis. All instruments were then removed from the vagina. A cystoscopy was then performed with the 60 degree scope with fluorescein given IV.  There were no sutures in the bladder and the ureters were identified and had normal jets noted bilaterally.   Gowns and gloves were changed and attention was returned to the abdomen, where pneumoperitoneum was again obtained and all inspected.  The cuff and pedicles were hemostatic and the ureters visualized and normal in appearance. A four quadrant view of the pelvis and abdomen was performed and found to be normal with no bleeding or injuries noted.  The oozing at the right ovary was rendered hemostatic with the harmonic scalpel and arista placed over the area.  The instruments were removed from the abdomen as well as the 5 mm lateral ports under visualization. The left 65mm port had a bit of bleeding controlled with Several 2-vicryl sutures and the harmonic internally.   The pneumoperitoneum was reduced through the trocar. The pursestring of the umbilical port was then closed with no defect noted while directly visualizing with the camera.   The 5 mm ports were then removed after pneumoperitoneum reduced. The umbilical incision was closed with a deep suture of 2-0 vicryl to close the subcutaneous space.   All skin incisions were closed with 4-0 vicryl in a subcuticular stitch and dermabond.  The hulka tenaculum was removed from the cervix. A very small 3-85mm sebaceous cyst was the only identifiable lesion on the left labia and drained with a needle of a small amount of milky fluid.  All instrument and sponge counts were correct and the patient was awakened and taken to recovery in good condition.

## 2018-05-08 NOTE — Anesthesia Procedure Notes (Signed)
Procedure Name: Intubation Date/Time: 05/08/2018 8:35 AM Performed by: Bufford Spikes, CRNA Pre-anesthesia Checklist: Patient identified, Emergency Drugs available, Suction available and Patient being monitored Patient Re-evaluated:Patient Re-evaluated prior to induction Oxygen Delivery Method: Circle system utilized Preoxygenation: Pre-oxygenation with 100% oxygen Induction Type: IV induction Ventilation: Mask ventilation without difficulty Laryngoscope Size: Miller and 2 Grade View: Grade I Tube type: Oral Tube size: 7.0 mm Number of attempts: 1 Airway Equipment and Method: Stylet Placement Confirmation: ETT inserted through vocal cords under direct vision,  positive ETCO2 and breath sounds checked- equal and bilateral Secured at: 21 cm Tube secured with: Tape Dental Injury: Teeth and Oropharynx as per pre-operative assessment

## 2018-05-08 NOTE — Transfer of Care (Signed)
Immediate Anesthesia Transfer of Care Note  Patient: [name] [name]  Procedure(s) Performed: LAPAROSCOPIC ASSISTED VAGINAL HYSTERECTOMY WITH SALPINGECTOMY,  I & D Sebaceous Left Labia (Bilateral Abdomen) CYSTOSCOPY (N/A Bladder)  Patient Location: PACU  Anesthesia Type:General  Level of Consciousness: awake, alert  and oriented  Airway & Oxygen Therapy: Patient Spontanous Breathing and Patient connected to nasal cannula oxygen  Post-op Assessment: Report given to RN and Post -op Vital signs reviewed and stable  Post vital signs: Reviewed and stable  Last Vitals:  Vitals Value Taken Time  BP 142/88 05/08/2018 11:38 AM  Temp    Pulse 101 05/08/2018 11:41 AM  Resp 13 05/08/2018 11:41 AM  SpO2 99 % 05/08/2018 11:41 AM  Vitals shown include unvalidated device data.  Last Pain:  Vitals:   05/08/18 0735  TempSrc: Oral      Patients Stated Pain Goal: 5 (49/61/16 4353)  Complications: No apparent anesthesia complications

## 2018-05-09 ENCOUNTER — Encounter (HOSPITAL_COMMUNITY): Payer: Self-pay | Admitting: Obstetrics and Gynecology

## 2018-05-09 DIAGNOSIS — R69 Illness, unspecified: Secondary | ICD-10-CM | POA: Diagnosis not present

## 2018-05-09 DIAGNOSIS — N907 Vulvar cyst: Secondary | ICD-10-CM | POA: Diagnosis not present

## 2018-05-09 DIAGNOSIS — D259 Leiomyoma of uterus, unspecified: Secondary | ICD-10-CM | POA: Diagnosis not present

## 2018-05-09 DIAGNOSIS — I10 Essential (primary) hypertension: Secondary | ICD-10-CM | POA: Diagnosis not present

## 2018-05-09 DIAGNOSIS — N838 Other noninflammatory disorders of ovary, fallopian tube and broad ligament: Secondary | ICD-10-CM | POA: Diagnosis not present

## 2018-05-09 DIAGNOSIS — K219 Gastro-esophageal reflux disease without esophagitis: Secondary | ICD-10-CM | POA: Diagnosis not present

## 2018-05-09 DIAGNOSIS — M797 Fibromyalgia: Secondary | ICD-10-CM | POA: Diagnosis not present

## 2018-05-09 DIAGNOSIS — N946 Dysmenorrhea, unspecified: Secondary | ICD-10-CM | POA: Diagnosis not present

## 2018-05-09 DIAGNOSIS — N920 Excessive and frequent menstruation with regular cycle: Secondary | ICD-10-CM | POA: Diagnosis not present

## 2018-05-09 DIAGNOSIS — N9489 Other specified conditions associated with female genital organs and menstrual cycle: Secondary | ICD-10-CM | POA: Diagnosis not present

## 2018-05-09 LAB — BASIC METABOLIC PANEL
Anion gap: 7 (ref 5–15)
BUN: 12 mg/dL (ref 6–20)
CALCIUM: 8.1 mg/dL — AB (ref 8.9–10.3)
CHLORIDE: 104 mmol/L (ref 101–111)
CO2: 25 mmol/L (ref 22–32)
Creatinine, Ser: 0.72 mg/dL (ref 0.44–1.00)
GFR calc Af Amer: >60 mL/min (ref >60)
GFR calc non Af Amer: >60 mL/min (ref >60)
Glucose, Bld: 159 mg/dL — ABNORMAL HIGH (ref 65–99)
Potassium: 4 mmol/L (ref 3.5–5.1)
Sodium: 136 mmol/L (ref 135–145)

## 2018-05-09 LAB — CBC
HEMATOCRIT: 28.8 % — AB (ref 36.0–46.0)
HEMOGLOBIN: 9.4 g/dL — AB (ref 12.0–15.0)
MCH: 28.1 pg (ref 26.0–34.0)
MCHC: 32.6 g/dL (ref 30.0–36.0)
MCV: 86.2 fL (ref 78.0–100.0)
Platelets: 286 10*3/uL (ref 150–400)
RBC: 3.34 MIL/uL — ABNORMAL LOW (ref 3.87–5.11)
RDW: 13.9 % (ref 11.5–15.5)
WBC: 14.9 10*3/uL — ABNORMAL HIGH (ref 4.0–10.5)

## 2018-05-09 MED ORDER — OXYCODONE HCL 5 MG PO TABS: 5.0000 mg | ORAL_TABLET | ORAL | 0 refills | Status: DC | PRN

## 2018-05-09 MED ORDER — ACETAMINOPHEN 325 MG PO TABS: 650.0000 mg | ORAL_TABLET | ORAL | 1 refills | Status: DC | PRN
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
## 2018-05-15 DIAGNOSIS — I10 Essential (primary) hypertension: Secondary | ICD-10-CM | POA: Diagnosis not present

## 2018-05-15 DIAGNOSIS — E669 Obesity, unspecified: Secondary | ICD-10-CM | POA: Diagnosis not present

## 2018-05-15 DIAGNOSIS — Z713 Dietary counseling and surveillance: Secondary | ICD-10-CM | POA: Diagnosis not present

## 2018-05-19 DIAGNOSIS — G4733 Obstructive sleep apnea (adult) (pediatric): Secondary | ICD-10-CM | POA: Diagnosis not present

## 2018-05-22 DIAGNOSIS — E669 Obesity, unspecified: Secondary | ICD-10-CM | POA: Diagnosis not present

## 2018-05-22 DIAGNOSIS — Z713 Dietary counseling and surveillance: Secondary | ICD-10-CM | POA: Diagnosis not present

## 2018-05-22 DIAGNOSIS — I10 Essential (primary) hypertension: Secondary | ICD-10-CM | POA: Diagnosis not present

## 2018-05-29 DIAGNOSIS — R69 Illness, unspecified: Secondary | ICD-10-CM | POA: Diagnosis not present

## 2018-05-30 DIAGNOSIS — I10 Essential (primary) hypertension: Secondary | ICD-10-CM | POA: Diagnosis not present

## 2018-05-30 DIAGNOSIS — E669 Obesity, unspecified: Secondary | ICD-10-CM | POA: Diagnosis not present

## 2018-05-30 DIAGNOSIS — E559 Vitamin D deficiency, unspecified: Secondary | ICD-10-CM | POA: Diagnosis not present

## 2018-05-30 DIAGNOSIS — Z713 Dietary counseling and surveillance: Secondary | ICD-10-CM | POA: Diagnosis not present

## 2018-06-03 ENCOUNTER — Encounter: Payer: Self-pay | Admitting: Pulmonary Disease

## 2018-06-03 ENCOUNTER — Ambulatory Visit (INDEPENDENT_AMBULATORY_CARE_PROVIDER_SITE_OTHER): Payer: Medicare HMO | Admitting: Pulmonary Disease

## 2018-06-03 ENCOUNTER — Ambulatory Visit (INDEPENDENT_AMBULATORY_CARE_PROVIDER_SITE_OTHER)
Admission: RE | Admit: 2018-06-03 | Discharge: 2018-06-03 | Disposition: A | Discharge status: Home / Self Care | Payer: Medicare HMO | Source: Ambulatory Visit | Attending: Pulmonary Disease | Admitting: Pulmonary Disease

## 2018-06-03 VITALS — BP 122/84 | HR 84 | Ht 62.0 in | Wt 210.0 lb

## 2018-06-03 DIAGNOSIS — Z720 Tobacco use: Secondary | ICD-10-CM

## 2018-06-03 DIAGNOSIS — R05 Cough: Secondary | ICD-10-CM | POA: Diagnosis not present

## 2018-06-03 DIAGNOSIS — G4733 Obstructive sleep apnea (adult) (pediatric): Secondary | ICD-10-CM | POA: Diagnosis not present

## 2018-06-03 DIAGNOSIS — R0602 Shortness of breath: Secondary | ICD-10-CM | POA: Diagnosis not present

## 2018-06-03 DIAGNOSIS — R053 Chronic cough: Secondary | ICD-10-CM

## 2018-06-03 DIAGNOSIS — R079 Chest pain, unspecified: Secondary | ICD-10-CM | POA: Diagnosis not present

## 2018-06-03 MED ORDER — ALBUTEROL SULFATE HFA 108 (90 BASE) MCG/ACT IN AERS: 2.0000 | INHALATION_SPRAY | Freq: Four times a day (QID) | RESPIRATORY_TRACT | 5 refills | Status: DC | PRN

## 2018-06-03 NOTE — Progress Notes (Signed)
[facility]  Chief Complaint  Patient presents with  . sleep consult    Pt referred by Dr. Betty Martinique MD. Pt is on cpap machine for last one year from [facility].  DMe - [facility].  Pt is doing well overall with cpap machine. Pt has daytime sleepiness started back 2 weeks ago.    Constitutional: BP 122/84 (BP Location: Left Arm, Cuff Size: Normal)   Pulse 84   Ht 5\' 2"  (1.575 m)   Wt 210 lb (95.3 kg)   SpO2 99%   BMI 38.41 kg/m   History of Present Illness: [name] [name] is a 47 y.o. female smoker with obstructive sleep apnea and cough.  She had sleep study several years ago and again in July.  She has severe sleep apnea.  She has been using CPAP.  She has full face mask.  Hasn't tried any other mask type.  Gets marks on her face from straps.  She goes to sleep at 10 pm.  She falls asleep in 20 minutes.  She wakes up some times to use the bathroom.  She gets out of bed at 830 am.  She feels achy in the morning from arthritis and fibromyalgia.  She denies morning headache.  She does not use anything to help her stay awake.  She uses trazodone a few times per month.  She talks in her sleep sometimes.  She denies sleep walking, bruxism, or nightmares.  There is no history of restless legs.  She denies sleep hallucinations, sleep paralysis, or cataplexy.  The Epworth score is 9 out of 24.  She has a chronic cough.  Has been present for years.  Doesn't usually bring up sputum.  Denies hemoptysis, wheeze, or chest pain.  Doesn't feel like her breathing limits her activity level.  Hasn't had PFT or tried inhalers before.  Still smokes.  Has quit on her own before.   Comprehensive Respiratory Exam:  Appearance - well kempt  ENMT - nasal mucosa moist, turbinates clear, midline nasal septum, no dental lesions, no gingival bleeding, no oral exudates, no tonsillar hypertrophy, MP 4 Neck - no masses, trachea midline, no thyromegaly,  no elevation in JVP Respiratory - normal appearance of chest wall, normal respiratory effort w/o accessory muscle use, no dullness on percussion, no tactile fremitus, no wheezing or rales CV - s1s2 regular rate and rhythm, no murmurs, no peripheral edema, no varicosities, radial pulses symmetric GI - soft, non tender, no masses, no hepatosplenomegaly Lymph - no adenopathy noted in neck and axillary areas MSK - normal muscle strength and tone, normal gait Ext - no cyanosis, clubbing, or joint inflammation noted Skin - no rashes, lesions, or ulcers Neuro - oriented to person, place, and time Psych - normal mood and affect  Discussion: She has history of severe sleep apnea.  She has been using CPAP and is compliant with this.  Her main issues has been with mask fit.  She currently uses full face mask, but hasn't used any other type of mask.  She has history of smoking.  She has chronic, non productive cough.  Assessment/Plan:  Obstructive sleep apnea. - she reports compliance with CPAP and benefit from therapy - will get copy of her download - will get her mask refitted  Obesity. - discussed how weight can impact sleep and risk for sleep disordered breathing - discussed options to assist with weight loss: combination of diet modification, cardiovascular and strength training exercises  Cardiovascular risk. - had an extensive discussion regarding the adverse health consequences related to untreated sleep disordered breathing - specifically discussed the risks for hypertension, coronary artery disease, cardiac dysrhythmias, cerebrovascular disease, and diabetes - lifestyle modification discussed  Safe driving practices. - discussed how sleep disruption can increase risk of accidents, particularly when driving - safe driving practices were discussed  Chronic cough. - will arrange for chest xray and pulmonary function test - prn ventolin  Tobacco abuse. - discussed options to assist  with smoking cessation - she will try quitting on her own   Patient Instructions  Will have [place] refit your CPAP mask Ventolin two puffs every 6 hours as needed for cough, wheeze, chest congestion or shortness of breath Chest xray today Will schedule pulmonary function test  Follow up in 1 month    Chesley Mires, MD [place] 06/03/2018, 9:35 AM  Flow Sheet  Sleep tests: PSG 06/12/17 >> AHI 50, SpO2 low 88%  Review of Systems: Constitutional: Negative for fever and unexpected weight change.  HENT: Positive for trouble swallowing. Negative for congestion, dental problem, ear pain, nosebleeds, postnasal drip, rhinorrhea, sinus pressure, sneezing and sore throat.   Eyes: Negative for redness and itching.  Respiratory: Positive for cough. Negative for chest tightness, shortness of breath and wheezing.   Cardiovascular: Negative for palpitations and leg swelling.  Gastrointestinal: Positive for nausea. Negative for vomiting.  Genitourinary: Negative for dysuria.  Musculoskeletal: Negative for joint swelling.  Skin: Negative for rash.  Allergic/Immunologic: Positive for environmental allergies. Negative for food allergies and immunocompromised state.  Neurological: Negative for headaches.  Hematological: Does not bruise/bleed easily.  Psychiatric/Behavioral: Positive for dysphoric mood. The patient is nervous/anxious.    Past Medical History: She  has a past medical history of Anxiety, Bipolar disorder ([place]), Chronic kidney disease, Depression, Fibromyalgia, GERD (gastroesophageal reflux disease), History of cervical dysplasia, History of exercise intolerance, History of kidney stones, Hyperlipidemia, Hypertension, Migraines, Osteoarthritis, PCOS (polycystic ovarian syndrome), Pneumonia (2018), Rash, Right ureteral calculus, Sleep apnea, and Tachycardia (cardiologist-  dr Ellyn Hack).  Past Surgical History: She  has a past surgical history that  includes Wisdom tooth extraction; Colonoscopy (last one 11-08-2006); Cholecystectomy (N/A, 03/13/2013); Anterior cervical decomp/discectomy fusion (N/A, 09/01/2015); Extracorporeal shock wave lithotripsy (Right, 04/23/2017); Extracorporeal shock wave lithotripsy (Right, 04/23/2017); DX LAPAROSCOPY W/ LYSIS ADHESIONS/  LASER VAPORIZATION OF CERVIX (06/25/2002); EXPLORATORY LEFT THUMB REPAIR TENDON/ LACERATION (09/27/1999); Cardiovascular stress test (04/27/2009); laparoscopy (07/30/2012); Esophagogastroduodenoscopy (04/25/2005); Cystoscopy with retrograde pyelogram, ureteroscopy and stent placement (Right, 05/18/2017); Holmium laser application (Right, 5/42/7062); Breast excisional biopsy (Bilateral, left 10/15/2002;   right 1997); Laparoscopic vaginal hysterectomy with salpingectomy (Bilateral, 05/08/2018); and Cystoscopy (N/A, 05/08/2018).  Family History: Her family history includes Arthritis in her father, maternal grandmother, mother, and paternal grandmother; Asthma in her paternal grandmother; COPD in her maternal grandmother, paternal grandfather, paternal grandmother, and son; Cancer in her paternal grandmother; Coronary artery disease in her unknown relative; Depression in her father, maternal grandmother, mother, and sister; Diabetes in her father and mother; Drug abuse in her son; Early death in her maternal grandfather; Emphysema in her unknown relative; Hearing loss in her father and paternal grandmother; Heart attack in her maternal grandfather, paternal grandfather, and paternal grandmother; Heart disease in her maternal grandmother and paternal grandmother; Hyperlipidemia in her paternal grandmother; Hypertension in her father, mother, and paternal grandmother; Kidney disease in her paternal grandmother; Stroke in her paternal grandmother; Tongue cancer in her unknown relative.  Social History: She  reports that she has  been smoking cigarettes.  She has a 25.00 pack-year smoking history. She has never  used smokeless tobacco. She reports that she does not drink alcohol or use drugs.  Medications: Allergies as of 06/03/2018      Reactions   Aspirin Shortness Of Breath, Other (See Comments)   Angiodema   Nsaids Anaphylaxis   Swelling of eyes mouth and throat difficulty breathing   Naproxen       Medication List        Accurate as of 06/03/18  9:35 AM. Always use your most recent med list.          acetaminophen 325 MG tablet Commonly known as:  TYLENOL Take 2 tablets (650 mg total) by mouth every 4 (four) hours as needed for mild pain or moderate pain.   albuterol 108 (90 Base) MCG/ACT inhaler Commonly known as:  VENTOLIN HFA Inhale 2 puffs into the lungs every 6 (six) hours as needed for wheezing or shortness of breath.   benztropine 0.5 MG tablet Commonly known as:  COGENTIN Take 0.5 mg by mouth 2 (two) times daily.   buPROPion 150 MG 12 hr tablet Commonly known as:  WELLBUTRIN SR Take 150 mg by mouth 2 (two) times daily.   carvedilol 12.5 MG tablet Commonly known as:  COREG Take 1 tablet (12.5 mg total) by mouth 2 (two) times daily with a meal.   Fish Oil 1000 MG Caps Take by mouth 3 (three) times daily.   hydrOXYzine 25 MG tablet Commonly known as:  ATARAX/VISTARIL Take 25 mg by mouth 3 (three) times daily.   lamoTRIgine 100 MG tablet Commonly known as:  LAMICTAL Take 100 mg by mouth 2 (two) times daily.   LATUDA 20 MG Tabs tablet Generic drug:  lurasidone Take 20 mg by mouth every evening.   omeprazole 40 MG capsule Commonly known as:  PRILOSEC Take 40 mg by mouth daily.   pregabalin 225 MG capsule Commonly known as:  LYRICA Take 1 capsule (225 mg total) by mouth 2 (two) times daily.   PRENATAL VITAMIN PO Take 1 tablet by mouth at bedtime.   traZODone 50 MG tablet Commonly known as:  DESYREL Take 25-75 mg by mouth at bedtime as needed for sleep.   Vitamin D (Ergocalciferol) 50000 units Caps capsule Commonly known as:  DRISDOL Take 50,000  Units by mouth every 7 (seven) days. On Mondays

## 2018-06-03 NOTE — Progress Notes (Signed)
   Subjective:    Patient ID: GEM CONKLE, female    DOB: 18-Sep-1971, 47 y.o.   MRN: [id number]  HPI    Review of Systems  Constitutional: Negative for fever and unexpected weight change.  HENT: Positive for trouble swallowing. Negative for congestion, dental problem, ear pain, nosebleeds, postnasal drip, rhinorrhea, sinus pressure, sneezing and sore throat.   Eyes: Negative for redness and itching.  Respiratory: Positive for cough. Negative for chest tightness, shortness of breath and wheezing.   Cardiovascular: Negative for palpitations and leg swelling.  Gastrointestinal: Positive for nausea. Negative for vomiting.  Genitourinary: Negative for dysuria.  Musculoskeletal: Negative for joint swelling.  Skin: Negative for rash.  Allergic/Immunologic: Positive for environmental allergies. Negative for food allergies and immunocompromised state.  Neurological: Negative for headaches.  Hematological: Does not bruise/bleed easily.  Psychiatric/Behavioral: Positive for dysphoric mood. The patient is nervous/anxious.        Objective:   Physical Exam        Assessment & Plan:

## 2018-06-03 NOTE — Patient Instructions (Signed)
Will have [place] refit your CPAP mask Ventolin two puffs every 6 hours as needed for cough, wheeze, chest congestion or shortness of breath Chest xray today Will schedule pulmonary function test  Follow up in 1 month

## 2018-06-04 ENCOUNTER — Telehealth: Payer: Self-pay | Admitting: Pulmonary Disease

## 2018-06-04 NOTE — Telephone Encounter (Signed)
Dg Chest 2 View  Result Date: 06/03/2018 CLINICAL DATA:  Cough and shortness of breath; chest pain EXAM: CHEST - 2 VIEW COMPARISON:  August 20, 2017 FINDINGS: There is no edema or consolidation. The heart size and pulmonary vascularity are normal. No adenopathy. There is postoperative change in the lower cervical region. IMPRESSION: No edema or consolidation. Electronically Signed   By: Lowella Grip III M.D.   On: 06/03/2018 10:10     Please let her know her CXR was normal.

## 2018-06-04 NOTE — Telephone Encounter (Signed)
Pt returned call-aware of CXR results are normal. Nothing more needed at this time.

## 2018-06-04 NOTE — Telephone Encounter (Signed)
Attempted to call patient today regarding results. I did not receive an answer at time of call. I have left a voicemail message for pt to return call. X1  

## 2018-06-06 DIAGNOSIS — E559 Vitamin D deficiency, unspecified: Secondary | ICD-10-CM | POA: Diagnosis not present

## 2018-06-06 DIAGNOSIS — Z713 Dietary counseling and surveillance: Secondary | ICD-10-CM | POA: Diagnosis not present

## 2018-06-06 DIAGNOSIS — I10 Essential (primary) hypertension: Secondary | ICD-10-CM | POA: Diagnosis not present

## 2018-06-06 DIAGNOSIS — E669 Obesity, unspecified: Secondary | ICD-10-CM | POA: Diagnosis not present

## 2018-06-13 DIAGNOSIS — I10 Essential (primary) hypertension: Secondary | ICD-10-CM | POA: Diagnosis not present

## 2018-06-13 DIAGNOSIS — E559 Vitamin D deficiency, unspecified: Secondary | ICD-10-CM | POA: Diagnosis not present

## 2018-06-13 DIAGNOSIS — Z713 Dietary counseling and surveillance: Secondary | ICD-10-CM | POA: Diagnosis not present

## 2018-06-13 DIAGNOSIS — E669 Obesity, unspecified: Secondary | ICD-10-CM | POA: Diagnosis not present

## 2018-06-17 DIAGNOSIS — R102 Pelvic and perineal pain: Secondary | ICD-10-CM | POA: Diagnosis not present

## 2018-06-17 DIAGNOSIS — Z09 Encounter for follow-up examination after completed treatment for conditions other than malignant neoplasm: Secondary | ICD-10-CM | POA: Diagnosis not present

## 2018-06-19 DIAGNOSIS — R69 Illness, unspecified: Secondary | ICD-10-CM | POA: Diagnosis not present

## 2018-06-20 DIAGNOSIS — F603 Borderline personality disorder: Secondary | ICD-10-CM | POA: Diagnosis not present

## 2018-06-20 DIAGNOSIS — F315 Bipolar disorder, current episode depressed, severe, with psychotic features: Secondary | ICD-10-CM | POA: Diagnosis not present

## 2018-06-20 DIAGNOSIS — R69 Illness, unspecified: Secondary | ICD-10-CM | POA: Diagnosis not present

## 2018-06-21 ENCOUNTER — Other Ambulatory Visit: Payer: Self-pay | Admitting: Family Medicine

## 2018-06-21 MED ORDER — OMEPRAZOLE 40 MG PO CPDR: 40.0000 mg | DELAYED_RELEASE_CAPSULE | Freq: Every day | ORAL | 3 refills | Status: DC

## 2018-06-21 NOTE — Telephone Encounter (Signed)
Copied from [place] [phone]. Topic: General - Other >> Jun 21, 2018 12:21 PM Lennox Solders wrote: Reason for CRM: pt is calling and needs new rx from dr Martinique omeprazole 40 mg #90 with refills sent to [facility]. This medication was prescribed by her old md

## 2018-06-21 NOTE — Telephone Encounter (Signed)
Rx sent to pharmacy   

## 2018-06-24 DIAGNOSIS — R69 Illness, unspecified: Secondary | ICD-10-CM | POA: Diagnosis not present

## 2018-06-24 DIAGNOSIS — F603 Borderline personality disorder: Secondary | ICD-10-CM | POA: Diagnosis not present

## 2018-06-24 DIAGNOSIS — F315 Bipolar disorder, current episode depressed, severe, with psychotic features: Secondary | ICD-10-CM | POA: Diagnosis not present

## 2018-06-25 ENCOUNTER — Encounter: Payer: Self-pay | Admitting: Family Medicine

## 2018-06-25 ENCOUNTER — Ambulatory Visit (INDEPENDENT_AMBULATORY_CARE_PROVIDER_SITE_OTHER): Payer: Medicare HMO

## 2018-06-25 ENCOUNTER — Ambulatory Visit (INDEPENDENT_AMBULATORY_CARE_PROVIDER_SITE_OTHER): Payer: Medicare HMO | Admitting: Family Medicine

## 2018-06-25 VITALS — BP 120/76 | HR 81 | Temp 97.9°F | Resp 12 | Ht 62.0 in | Wt 203.5 lb

## 2018-06-25 DIAGNOSIS — R739 Hyperglycemia, unspecified: Secondary | ICD-10-CM

## 2018-06-25 DIAGNOSIS — Z5181 Encounter for therapeutic drug level monitoring: Secondary | ICD-10-CM

## 2018-06-25 DIAGNOSIS — S99922A Unspecified injury of left foot, initial encounter: Secondary | ICD-10-CM

## 2018-06-25 DIAGNOSIS — R5383 Other fatigue: Secondary | ICD-10-CM

## 2018-06-25 DIAGNOSIS — M797 Fibromyalgia: Secondary | ICD-10-CM

## 2018-06-25 DIAGNOSIS — E559 Vitamin D deficiency, unspecified: Secondary | ICD-10-CM

## 2018-06-25 DIAGNOSIS — L0292 Furuncle, unspecified: Secondary | ICD-10-CM

## 2018-06-25 DIAGNOSIS — D509 Iron deficiency anemia, unspecified: Secondary | ICD-10-CM | POA: Diagnosis not present

## 2018-06-25 DIAGNOSIS — M7989 Other specified soft tissue disorders: Secondary | ICD-10-CM | POA: Diagnosis not present

## 2018-06-25 DIAGNOSIS — M79672 Pain in left foot: Secondary | ICD-10-CM | POA: Diagnosis not present

## 2018-06-25 LAB — CBC WITH DIFFERENTIAL/PLATELET
Basophils Absolute: 0 10*3/uL (ref 0.0–0.1)
Basophils Relative: 0.4 % (ref 0.0–3.0)
EOS PCT: 4.7 % (ref 0.0–5.0)
Eosinophils Absolute: 0.4 10*3/uL (ref 0.0–0.7)
HCT: 37.2 % (ref 36.0–46.0)
HEMOGLOBIN: 12.2 g/dL (ref 12.0–15.0)
LYMPHS PCT: 28.3 % (ref 12.0–46.0)
Lymphs Abs: 2.2 10*3/uL (ref 0.7–4.0)
MCHC: 32.9 g/dL (ref 30.0–36.0)
MCV: 80.7 fl (ref 78.0–100.0)
MONOS PCT: 7 % (ref 3.0–12.0)
Monocytes Absolute: 0.5 10*3/uL (ref 0.1–1.0)
Neutro Abs: 4.7 10*3/uL (ref 1.4–7.7)
Neutrophils Relative %: 59.6 % (ref 43.0–77.0)
Platelets: 329 10*3/uL (ref 150.0–400.0)
RBC: 4.61 Mil/uL (ref 3.87–5.11)
RDW: 14.5 % (ref 11.5–15.5)
WBC: 7.8 10*3/uL (ref 4.0–10.5)

## 2018-06-25 LAB — LIPID PANEL
Cholesterol: 136 mg/dL (ref 0–200)
HDL: 33.3 mg/dL — ABNORMAL LOW (ref >39.00)
LDL Cholesterol: 77 mg/dL (ref 0–99)
NONHDL: 102.48
Total CHOL/HDL Ratio: 4
Triglycerides: 125 mg/dL (ref 0.0–149.0)
VLDL: 25 mg/dL (ref 0.0–40.0)

## 2018-06-25 LAB — HEMOGLOBIN A1C: Hgb A1c MFr Bld: 5.4 % (ref 4.6–6.5)

## 2018-06-25 LAB — VITAMIN D 25 HYDROXY (VIT D DEFICIENCY, FRACTURES): VITD: 42.01 ng/mL (ref 30.00–100.00)

## 2018-06-25 MED ORDER — DOXYCYCLINE HYCLATE 100 MG PO TABS: 100.0000 mg | ORAL_TABLET | Freq: Every day | ORAL | 0 refills | Status: DC

## 2018-06-25 NOTE — Assessment & Plan Note (Signed)
Chronic. Work up has been otherwise normal. Some of her chronic medical problems could aggravate problem.

## 2018-06-25 NOTE — Assessment & Plan Note (Signed)
She will continue Lyrica 225 mg bid. Educated about clinical manifestations of disease,prognosis,and treatment options. Good sleep hygiene and low impact exercise. F/U in 5 months.

## 2018-06-25 NOTE — Assessment & Plan Note (Signed)
No changes in current management, will follow labs done today and will give further recommendations accordingly.  

## 2018-06-25 NOTE — Patient Instructions (Signed)
A few things to remember from today's visit:   Fatigue, unspecified type  Vitamin D deficiency, unspecified - Plan: VITAMIN D 25 Hydroxy (Vit-D Deficiency, Fractures)  Fibromyalgia  Foot injury, left, initial encounter  Encounter for medication monitoring - Plan: Lipid panel  Iron deficiency anemia, unspecified iron deficiency anemia type - Plan: CBC with Differential/Platelet  Hyperglycemia - Plan: Hemoglobin A1c   Please be sure medication list is accurate. If a new problem present, please set up appointment sooner than planned today.

## 2018-06-25 NOTE — Assessment & Plan Note (Addendum)
Improved. She will continue oral Doxycycline 100 mg for another month then continue with Mupirocin Side effects of oral abx discussed.

## 2018-06-25 NOTE — Progress Notes (Signed)
HPI:   [name] [name] is a 47 y.o. female, who is here today for 2-3 months follow up.   She was last seen on 03/25/2018.  Since her last OV she has followed up with her psychiatrist. She has a prescription but with labs her psychiatrist would like to have done today: Lipid panel, A1c, and CBC.  Furunculosis: Hx of recurrent "boils."   Last OV she started Doxycycline 100 mg bid and currently taking it once daily. No side effects reported. Frequency and severity has greatly improved.SDhe does not have an active lesion today.  Fibromyalgia: Dx by rheumatologist. She is still on Lyrica 225 mg twice daily. Having arthralgias and myalgias, "back pain." No limitation of range of motion but movement increases pain. Pain is alleviated by rest. Memory has improved at home since she decreased The dose of Lyrica but is still having some.   She is walking daily, started exercising about 4 to 5 weeks ago. She is also trying to eat healthier.   Hyperglycemia: 159 on 05/09/18.  Anemia: She is not aware of recent lab results. She is not on iron supplementation.  Lab Results  Component Value Date   WBC 14.9 (H) 05/09/2018   HGB 9.4 (L) 05/09/2018   HCT 28.8 (L) 05/09/2018   MCV 86.2 05/09/2018   PLT 286 05/09/2018   Vit D deficiency: Ergocalciferol 50,000 U weekly. Initially fatigue improved with vitamin D supplementation but she is back to her baseline.  She is wearing her CPAP.   Foot pain:  Left foot pain after she felt 2-3 months ago,small laceration on dorsum of left foot. She did not seek medical attention. Laceration healed but has a residual "knot" on area and shooting like pain that radiated to ankle. No edema or erythema. Pain is exacerbated by pressing area or wearing tight shoes. No alleviating factros identified.     Review of Systems  Constitutional: Positive for fatigue. Negative for activity change, appetite change and fever.  HENT: Negative  for mouth sores, nosebleeds and trouble swallowing.   Eyes: Negative for redness and visual disturbance.  Respiratory: Negative for cough, shortness of breath and wheezing.   Cardiovascular: Negative for chest pain, palpitations and leg swelling.  Gastrointestinal: Negative for abdominal pain, nausea and vomiting.       Negative for changes in bowel habits.  Endocrine: Negative for polydipsia, polyphagia and polyuria.  Genitourinary: Negative for decreased urine volume and hematuria.  Musculoskeletal: Positive for arthralgias, back pain and myalgias. Negative for gait problem.  Neurological: Negative for syncope, weakness and headaches.  Hematological: Negative for adenopathy. Does not bruise/bleed easily.  Psychiatric/Behavioral: Negative for confusion. The patient is nervous/anxious.      Current Outpatient Medications on File Prior to Visit  Medication Sig Dispense Refill  . acetaminophen (TYLENOL) 325 MG tablet Take 2 tablets (650 mg total) by mouth every 4 (four) hours as needed for mild pain or moderate pain. 30 tablet 1  . albuterol (VENTOLIN HFA) 108 (90 Base) MCG/ACT inhaler Inhale 2 puffs into the lungs every 6 (six) hours as needed for wheezing or shortness of breath. 1 Inhaler 5  . benztropine (COGENTIN) 0.5 MG tablet Take 0.5 mg by mouth 2 (two) times daily.     Marland Kitchen buPROPion (WELLBUTRIN SR) 150 MG 12 hr tablet Take 150 mg by mouth 2 (two) times daily.    . carvedilol (COREG) 12.5 MG tablet Take 1 tablet (12.5 mg total) by mouth 2 (two) times daily with  a meal. (Patient taking differently: Take 12.5-25 mg by mouth every 12 (twelve) hours. ) 180 tablet 1  . hydrOXYzine (ATARAX/VISTARIL) 25 MG tablet Take 25 mg by mouth 3 (three) times daily.     Marland Kitchen lamoTRIgine (LAMICTAL) 100 MG tablet Take 100 mg by mouth 2 (two) times daily.    Marland Kitchen lurasidone (LATUDA) 20 MG TABS tablet Take 20 mg by mouth every evening.    . Omega-3 Fatty Acids (FISH OIL) 1000 MG CAPS Take by mouth 3 (three) times  daily.     Marland Kitchen omeprazole (PRILOSEC) 40 MG capsule Take 1 capsule (40 mg total) by mouth daily. 90 capsule 3  . pregabalin (LYRICA) 225 MG capsule Take 1 capsule (225 mg total) by mouth 2 (two) times daily. 60 capsule 2  . Prenatal Vit-Fe Fumarate-FA (PRENATAL VITAMIN PO) Take 1 tablet by mouth at bedtime.    . traZODone (DESYREL) 50 MG tablet Take 25-75 mg by mouth at bedtime as needed for sleep.     . Vitamin D, Ergocalciferol, (DRISDOL) 50000 units CAPS capsule Take 50,000 Units by mouth every 7 (seven) days. On Mondays     No current facility-administered medications on file prior to visit.      Past Medical History:  Diagnosis Date  . Anxiety   . Bipolar disorder ([place])   . Chronic kidney disease    kidney infections  . Depression   . Fibromyalgia   . GERD (gastroesophageal reflux disease)   . History of cervical dysplasia   . History of exercise intolerance    normal ETT 10-24-2016  . History of kidney stones   . Hyperlipidemia   . Hypertension   . Migraines   . Osteoarthritis   . PCOS (polycystic ovarian syndrome)   . Pneumonia 2018  . Rash    in area of eswl 04-23-2017  . Right ureteral calculus   . Sleep apnea   . Tachycardia cardiologist-  dr harding   controlled w/ metoprolol   Allergies  Allergen Reactions  . Aspirin Shortness Of Breath and Other (See Comments)    Angiodema  . Nsaids Anaphylaxis    Swelling of eyes mouth and throat difficulty breathing  . Naproxen     Social History   Socioeconomic History  . Marital status: Legally Separated    Spouse name: Not on file  . Number of children: 2  . Years of education: Not on file  . Highest education level: Not on file  Occupational History  . Occupation: Unemployed  Social Needs  . Financial resource strain: Not on file  . Food insecurity:    Worry: Not on file    Inability: Not on file  . Transportation needs:    Medical: Not on file    Non-medical: Not on file  Tobacco Use  . Smoking status:  Current Some Day Smoker    Packs/day: 1.00    Years: 25.00    Pack years: 25.00    Types: Cigarettes  . Smokeless tobacco: Never Used  . Tobacco comment: 6-7 cig. daily  Substance and Sexual Activity  . Alcohol use: No  . Drug use: No    Types: Cocaine    Comment: last cocaine use 2014  . Sexual activity: Not Currently    Partners: Male    Birth control/protection: None  Lifestyle  . Physical activity:    Days per week: Not on file    Minutes per session: Not on file  . Stress: Not on file  Relationships  .  Social connections:    Talks on phone: Not on file    Gets together: Not on file    Attends religious service: Not on file    Active member of club or organization: Not on file    Attends meetings of clubs or organizations: Not on file    Relationship status: Not on file  Other Topics Concern  . Not on file  Social History Narrative   She lives at home with her spouse. She does not exercise.   She currently denies recent recreational drug use.    Vitals:   06/25/18 0758  BP: 120/76  Pulse: 81  Resp: 12  Temp: 97.9 F (36.6 C)  SpO2: 98%   Body mass index is 37.22 kg/m.   Physical Exam  Nursing note and vitals reviewed. Constitutional: She is oriented to person, place, and time. She appears well-developed. No distress.  HENT:  Head: Normocephalic and atraumatic.  Mouth/Throat: Oropharynx is clear and moist and mucous membranes are normal.  Eyes: Pupils are equal, round, and reactive to light. Conjunctivae are normal.  Cardiovascular: Normal rate and regular rhythm.  No murmur heard. Pulses:      Dorsalis pedis pulses are 2+ on the right side, and 2+ on the left side.  Respiratory: Effort normal and breath sounds normal. No respiratory distress.  GI: Soft. She exhibits no mass. There is no hepatomegaly. There is no tenderness.  Musculoskeletal: She exhibits no edema.  Tender points on chest wall,upper back,and right forearms.  Lymphadenopathy:    She  has no cervical adenopathy.  Neurological: She is alert and oriented to person, place, and time. She has normal strength. Gait normal.  Skin: Skin is warm. No rash noted. No erythema.  Psychiatric: Her mood appears anxious.  Well groomed, good eye contact.       ASSESSMENT AND PLAN:   [name] [name] was seen today for 3 months follow-up.  Orders Placed This Encounter  Procedures  . DG Foot Complete Left  . Lipid panel  . CBC with Differential/Platelet  . Hemoglobin A1c  . VITAMIN D 25 Hydroxy (Vit-D Deficiency, Fractures)    Lab Results  Component Value Date   HGBA1C 5.4 06/25/2018   Lab Results  Component Value Date   WBC 7.8 06/25/2018   HGB 12.2 06/25/2018   HCT 37.2 06/25/2018   MCV 80.7 06/25/2018   PLT 329.0 06/25/2018   Lab Results  Component Value Date   CHOL 136 06/25/2018   HDL 33.30 (L) 06/25/2018   LDLCALC 77 06/25/2018   TRIG 125.0 06/25/2018   CHOLHDL 4 06/25/2018   The 10-year ASCVD risk score Mikey Bussing DC Jr., et al., 2013) is: 3.7%   Values used to calculate the score:     Age: 36 years     Sex: Female     Is Non-Hispanic African American: No     Diabetic: No     Tobacco smoker: Yes     Systolic Blood Pressure: 086 mmHg     Is BP treated: Yes     HDL Cholesterol: 33.3 mg/dL     Total Cholesterol: 136 mg/dL  Foot injury, left, initial encounter  With residual pain,which could be aggravated by her Hx of fibromyalgia. Recommended avoiding tight shoes. Podiatrist evaluation may be necessary.  Further recommendations will be given according to imaging.  - DG Foot Complete Left; Future  Encounter for medication monitoring  Lab requested by her psychiatrist done today: CBC,HgA1C,and FLP.  Hyperglycemia  Primary  prevention through healthy diet and regular activity recommended. Some of her medications may increased risk.  - Hemoglobin A1c  Vitamin D deficiency, unspecified No changes in current management, will follow labs done  today and will give further recommendations accordingly.   Fatigue Chronic. Work up has been otherwise normal. Some of her chronic medical problems could aggravate problem.  Forunculosis Improved. She will continue oral Doxycycline 100 mg for another month then continue with Mupirocin Side effects of oral abx discussed.  Fibromyalgia She will continue Lyrica 225 mg bid. Educated about clinical manifestations of disease,prognosis,and treatment options. Good sleep hygiene and low impact exercise. F/U in 5 months.   Iron deficiency anemia, unspecified iron deficiency anemia type  Further recommendations will be given according to CBC results.      [name] Dunkleberger G. Martinique, MD  [facility]. [place] office.

## 2018-06-26 DIAGNOSIS — R69 Illness, unspecified: Secondary | ICD-10-CM | POA: Diagnosis not present

## 2018-06-27 ENCOUNTER — Encounter: Payer: Self-pay | Admitting: Family Medicine

## 2018-06-27 DIAGNOSIS — M2550 Pain in unspecified joint: Secondary | ICD-10-CM | POA: Diagnosis not present

## 2018-06-27 DIAGNOSIS — E669 Obesity, unspecified: Secondary | ICD-10-CM | POA: Diagnosis not present

## 2018-06-27 DIAGNOSIS — I10 Essential (primary) hypertension: Secondary | ICD-10-CM | POA: Diagnosis not present

## 2018-06-27 DIAGNOSIS — Z713 Dietary counseling and surveillance: Secondary | ICD-10-CM | POA: Diagnosis not present

## 2018-07-01 DIAGNOSIS — R69 Illness, unspecified: Secondary | ICD-10-CM | POA: Diagnosis not present

## 2018-07-01 DIAGNOSIS — F315 Bipolar disorder, current episode depressed, severe, with psychotic features: Secondary | ICD-10-CM | POA: Diagnosis not present

## 2018-07-01 DIAGNOSIS — F603 Borderline personality disorder: Secondary | ICD-10-CM | POA: Diagnosis not present

## 2018-07-08 DIAGNOSIS — F315 Bipolar disorder, current episode depressed, severe, with psychotic features: Secondary | ICD-10-CM | POA: Diagnosis not present

## 2018-07-08 DIAGNOSIS — R69 Illness, unspecified: Secondary | ICD-10-CM | POA: Diagnosis not present

## 2018-07-08 DIAGNOSIS — F603 Borderline personality disorder: Secondary | ICD-10-CM | POA: Diagnosis not present

## 2018-07-10 ENCOUNTER — Ambulatory Visit: Payer: Self-pay | Admitting: Pulmonary Disease

## 2018-07-11 ENCOUNTER — Other Ambulatory Visit: Payer: Self-pay | Admitting: Family Medicine

## 2018-07-11 DIAGNOSIS — M797 Fibromyalgia: Secondary | ICD-10-CM

## 2018-07-16 ENCOUNTER — Other Ambulatory Visit: Payer: Self-pay | Admitting: Family Medicine

## 2018-07-17 DIAGNOSIS — R69 Illness, unspecified: Secondary | ICD-10-CM | POA: Diagnosis not present

## 2018-07-24 DIAGNOSIS — R69 Illness, unspecified: Secondary | ICD-10-CM | POA: Diagnosis not present

## 2018-07-29 DIAGNOSIS — Z0100 Encounter for examination of eyes and vision without abnormal findings: Secondary | ICD-10-CM | POA: Diagnosis not present

## 2018-08-08 ENCOUNTER — Other Ambulatory Visit: Payer: Self-pay

## 2018-08-08 DIAGNOSIS — G4733 Obstructive sleep apnea (adult) (pediatric): Secondary | ICD-10-CM | POA: Diagnosis not present

## 2018-08-13 ENCOUNTER — Other Ambulatory Visit: Payer: Self-pay | Admitting: Family Medicine

## 2018-08-13 ENCOUNTER — Ambulatory Visit
Admission: RE | Admit: 2018-08-13 | Discharge: 2018-08-13 | Disposition: A | Discharge status: Home / Self Care | Payer: Medicare HMO | Source: Ambulatory Visit | Attending: Family Medicine | Admitting: Family Medicine

## 2018-08-13 DIAGNOSIS — N6001 Solitary cyst of right breast: Secondary | ICD-10-CM

## 2018-08-13 DIAGNOSIS — N6310 Unspecified lump in the right breast, unspecified quadrant: Secondary | ICD-10-CM

## 2018-08-13 DIAGNOSIS — R928 Other abnormal and inconclusive findings on diagnostic imaging of breast: Secondary | ICD-10-CM | POA: Diagnosis not present

## 2018-08-14 DIAGNOSIS — G4733 Obstructive sleep apnea (adult) (pediatric): Secondary | ICD-10-CM | POA: Diagnosis not present

## 2018-08-14 DIAGNOSIS — R69 Illness, unspecified: Secondary | ICD-10-CM | POA: Diagnosis not present

## 2018-08-15 ENCOUNTER — Other Ambulatory Visit: Payer: Self-pay | Admitting: Family Medicine

## 2018-08-15 DIAGNOSIS — L0292 Furuncle, unspecified: Secondary | ICD-10-CM

## 2018-08-21 DIAGNOSIS — R69 Illness, unspecified: Secondary | ICD-10-CM | POA: Diagnosis not present

## 2018-08-23 ENCOUNTER — Encounter: Payer: Self-pay | Admitting: Family Medicine

## 2018-08-23 ENCOUNTER — Ambulatory Visit (INDEPENDENT_AMBULATORY_CARE_PROVIDER_SITE_OTHER): Payer: Medicare HMO | Admitting: Family Medicine

## 2018-08-23 VITALS — BP 124/80 | HR 70 | Temp 98.8°F | Resp 12 | Ht 62.0 in | Wt 207.0 lb

## 2018-08-23 DIAGNOSIS — M797 Fibromyalgia: Secondary | ICD-10-CM | POA: Diagnosis not present

## 2018-08-23 DIAGNOSIS — M7712 Lateral epicondylitis, left elbow: Secondary | ICD-10-CM | POA: Diagnosis not present

## 2018-08-23 DIAGNOSIS — M25571 Pain in right ankle and joints of right foot: Secondary | ICD-10-CM | POA: Diagnosis not present

## 2018-08-23 DIAGNOSIS — R69 Illness, unspecified: Secondary | ICD-10-CM | POA: Diagnosis not present

## 2018-08-23 DIAGNOSIS — M2550 Pain in unspecified joint: Secondary | ICD-10-CM

## 2018-08-23 DIAGNOSIS — F332 Major depressive disorder, recurrent severe without psychotic features: Secondary | ICD-10-CM

## 2018-08-23 DIAGNOSIS — M25572 Pain in left ankle and joints of left foot: Secondary | ICD-10-CM

## 2018-08-23 DIAGNOSIS — M7061 Trochanteric bursitis, right hip: Secondary | ICD-10-CM | POA: Diagnosis not present

## 2018-08-23 MED ORDER — MILNACIPRAN HCL 12.5 & 25 & 50 MG PO MISC: ORAL | 0 refills | Status: DC

## 2018-08-23 NOTE — Patient Instructions (Addendum)
A few things to remember from today's visit:   Fibromyalgia - Plan: Milnacipran HCl (SAVELLA TITRATION PACK) 12.5 & 25 & 50 MG MISC  Trochanteric bursitis of right hip  Pain in joints of both feet  Lateral epicondylitis of left elbow - Plan: Ambulatory referral to Physical Therapy  Savella started today. No changes in Lyrica. Local ice on left elbow.   Please be sure medication list is accurate. If a new problem present, please set up appointment sooner than planned today.

## 2018-08-23 NOTE — Assessment & Plan Note (Signed)
We discussed possible etiologies. This problem could be related with fibromyalgia. ?  OA. OTC Tylenol 500 mg 4 times per day recommended for now.

## 2018-08-23 NOTE — Assessment & Plan Note (Signed)
He educated about diagnosis, prognosis, and treatment options. In the past she took gabapentin and Cymbalta, and this medication did not help. Currently she is on Lyrica, no changes in dose. She agrees with trying Savella, titration pack sent to her pharmacy. Follow-up in 4 to 6 weeks.

## 2018-08-23 NOTE — Assessment & Plan Note (Signed)
Problem is not well controlled. Continue follow-up with psychotherapist weekly and with psychiatrist monthly.

## 2018-08-23 NOTE — Progress Notes (Signed)
ACUTE VISIT   HPI:  Chief Complaint  Patient presents with  . Generalized Body Aches    [name] is a 47 y.o. female, who is here today complaining of "pain all over." She has history of fibromyalgia, depression, anxiety, and arthritis. Currently she is on Lyrica 225 mg twice daily.  Medication was decreased from 300 mg twice daily because of side effects.  Myalgias and arthralgias, mainly IP fingers,hips,and feet.   Pain is constant ,"very bad." She has not identified alleviating factors identified. Exacerbated by any movement and prolonged sitting/standing. Getting worse.  No skin rash,fever,or chills. No sick contact or recent travel.   Chronic fatigue.  -Major depression and mood disorder. Pain is exacerbating symptoms.  She follows with psychotherapist weekly and with psychiatrist every 3 to 4 weeks. Currently she is on Lamictal, benztropine, Wellbutrin, hydroxyzine, and trazodone.  -Last visit she was complaining of left foot pain after injury she had a few months ago, she states that she is still having pain and now she is having pain in right foot.  Worsening right hip pain, exacerbated by lying in bed on her right side. No history of trauma. She has not noted local edema or erythema.  New onset of left elbow pain, started about 1-2 months. Pain started after playing with her nephew,she felt a pull. No limitations of ROM but it elicits pain. "It huts bad." Throbbing,constant pain. Exacerbated by movement and palpation. Alleviated by rest.   Review of Systems  Constitutional: Positive for fatigue. Negative for activity change, appetite change and fever.  HENT: Negative for mouth sores, nosebleeds and trouble swallowing.   Eyes: Negative for redness and visual disturbance.  Respiratory: Negative for cough, shortness of breath and wheezing.   Cardiovascular: Negative for chest pain, palpitations and leg swelling.  Gastrointestinal:  Negative for abdominal pain, nausea and vomiting.       Negative for changes in bowel habits.  Genitourinary: Negative for decreased urine volume, dysuria and hematuria.  Musculoskeletal: Positive for arthralgias, back pain and myalgias.  Skin: Negative for rash and wound.  Neurological: Negative for syncope, weakness and headaches.  Psychiatric/Behavioral: Positive for sleep disturbance. Negative for confusion. The patient is nervous/anxious.       Current Outpatient Medications on File Prior to Visit  Medication Sig Dispense Refill  . acetaminophen (TYLENOL) 325 MG tablet Take 2 tablets (650 mg total) by mouth every 4 (four) hours as needed for mild pain or moderate pain. 30 tablet 1  . albuterol (VENTOLIN HFA) 108 (90 Base) MCG/ACT inhaler Inhale 2 puffs into the lungs every 6 (six) hours as needed for wheezing or shortness of breath. 1 Inhaler 5  . benztropine (COGENTIN) 0.5 MG tablet Take 0.5 mg by mouth 2 (two) times daily.     Marland Kitchen buPROPion (WELLBUTRIN SR) 150 MG 12 hr tablet Take 150 mg by mouth 2 (two) times daily.    . carvedilol (COREG) 12.5 MG tablet Take 1 tablet (12.5 mg total) by mouth 2 (two) times daily with a meal. (Patient taking differently: Take 12.5-25 mg by mouth every 12 (twelve) hours. ) 180 tablet 1  . doxycycline (VIBRA-TABS) 100 MG tablet TAKE 1 TABLET BY MOUTH DAILY 30 tablet 0  . hydrOXYzine (ATARAX/VISTARIL) 25 MG tablet Take 25 mg by mouth 3 (three) times daily.     Marland Kitchen lamoTRIgine (LAMICTAL) 100 MG tablet Take 100 mg by mouth 2 (two) times daily.    Marland Kitchen lurasidone (LATUDA) 20 MG TABS  tablet Take 20 mg by mouth every evening.    Marland Kitchen LYRICA 225 MG capsule TAKE 1 CAPSULE BY MOUTH TWICE DAILY 60 capsule 2  . Omega-3 Fatty Acids (FISH OIL) 1000 MG CAPS Take by mouth 3 (three) times daily.     Marland Kitchen omeprazole (PRILOSEC) 40 MG capsule Take 1 capsule (40 mg total) by mouth daily. 90 capsule 3  . Prenatal Vit-Fe Fumarate-FA (PRENATAL VITAMIN PO) Take 1 tablet by mouth at  bedtime.    . traZODone (DESYREL) 50 MG tablet Take 25-75 mg by mouth at bedtime as needed for sleep.     . Vitamin D, Ergocalciferol, (DRISDOL) 50000 units CAPS capsule Take 50,000 Units by mouth every 7 (seven) days. On Mondays     No current facility-administered medications on file prior to visit.      Past Medical History:  Diagnosis Date  . Anxiety   . Bipolar disorder ([place])   . Chronic kidney disease    kidney infections  . Depression   . Fibromyalgia   . GERD (gastroesophageal reflux disease)   . History of cervical dysplasia   . History of exercise intolerance    normal ETT 10-24-2016  . History of kidney stones   . Hyperlipidemia   . Hypertension   . Migraines   . Osteoarthritis   . PCOS (polycystic ovarian syndrome)   . Pneumonia 2018  . Rash    in area of eswl 04-23-2017  . Right ureteral calculus   . Sleep apnea   . Tachycardia cardiologist-  dr harding   controlled w/ metoprolol   Allergies  Allergen Reactions  . Aspirin Shortness Of Breath and Other (See Comments)    Angiodema  . Nsaids Anaphylaxis    Swelling of eyes mouth and throat difficulty breathing  . Naproxen     Social History   Socioeconomic History  . Marital status: Legally Separated    Spouse name: Not on file  . Number of children: 2  . Years of education: Not on file  . Highest education level: Not on file  Occupational History  . Occupation: Unemployed  Social Needs  . Financial resource strain: Not on file  . Food insecurity:    Worry: Not on file    Inability: Not on file  . Transportation needs:    Medical: Not on file    Non-medical: Not on file  Tobacco Use  . Smoking status: Current Some Day Smoker    Packs/day: 1.00    Years: 25.00    Pack years: 25.00    Types: Cigarettes  . Smokeless tobacco: Never Used  . Tobacco comment: 6-7 cig. daily  Substance and Sexual Activity  . Alcohol use: No  . Drug use: No    Types: Cocaine    Comment: last cocaine use 2014    . Sexual activity: Not Currently    Partners: Male    Birth control/protection: None  Lifestyle  . Physical activity:    Days per week: Not on file    Minutes per session: Not on file  . Stress: Not on file  Relationships  . Social connections:    Talks on phone: Not on file    Gets together: Not on file    Attends religious service: Not on file    Active member of club or organization: Not on file    Attends meetings of clubs or organizations: Not on file    Relationship status: Not on file  Other Topics Concern  .  Not on file  Social History Narrative   She lives at home with her spouse. She does not exercise.   She currently denies recent recreational drug use.    Vitals:   08/23/18 1133  BP: 124/80  Pulse: 70  Resp: 12  Temp: 98.8 F (37.1 C)  SpO2: 98%   Body mass index is 37.86 kg/m.   Physical Exam  Nursing note and vitals reviewed. Constitutional: She is oriented to person, place, and time. She appears well-developed. No distress.  HENT:  Head: Normocephalic and atraumatic.  Mouth/Throat: Oropharynx is clear and moist. Mucous membranes are dry.  Eyes: Pupils are equal, round, and reactive to light. Conjunctivae are normal.  Cardiovascular: Normal rate and regular rhythm.  No murmur heard. Pulses:      Dorsalis pedis pulses are 2+ on the right side, and 2+ on the left side.  Respiratory: Effort normal and breath sounds normal. No respiratory distress.  GI: Soft. She exhibits no mass. There is no hepatomegaly. There is no tenderness.  Musculoskeletal: She exhibits no edema.       Left elbow: She exhibits normal range of motion, no swelling and no deformity. Tenderness found. Lateral epicondyle tenderness noted.       Right hip: She exhibits tenderness. She exhibits normal range of motion, normal strength and no deformity.  + Tender trigger points mid and lower back, chest wall, upper extremities. Left elbow: Tenderness upon palpation of lateral epicondyle,  pain aggravated by supination with assistance. Left hip: Tenderness upon palpation of great trochanteric area.   Lymphadenopathy:    She has no cervical adenopathy.  Neurological: She is alert and oriented to person, place, and time. She has normal strength. No cranial nerve deficit. Gait normal.  Skin: Skin is warm. No rash noted. No erythema.  Psychiatric: Her mood appears anxious. Her affect is labile. She exhibits a depressed mood.  Well groomed, good eye contact.      ASSESSMENT AND PLAN:   Ms. Karron was seen today for generalized body aches.  Diagnoses and all orders for this visit:  Fibromyalgia He educated about diagnosis, prognosis, and treatment options. In the past she took gabapentin and Cymbalta, and this medication did not help. Currently she is on Lyrica, no changes in dose. She agrees with trying Savella, titration pack sent to her pharmacy. Follow-up in 4 to 6 weeks.  MDD (major depressive disorder), recurrent severe, without psychosis ([place]) Problem is not well controlled. Continue follow-up with psychotherapist weekly and with psychiatrist monthly.   Polyarthralgia We discussed possible etiologies. This problem could be related with fibromyalgia. ?  OA. OTC Tylenol 500 mg 4 times per day recommended for now.   Trochanteric bursitis of right hip After obtaining verbal consent and in a sterile fashion she received injection in right greater trochanteric bursa, 40 mg of Kenalog + plain lidocaine 2 cc. She tolerated procedure well. No complications. 50 minutes of the procedure she reports gradual improvement in right hip pain.  Pain in joints of both feet Relatively new problem. Could be related to fibromyalgia. ? Tenosynovitis or OA. Recommend arranging appt with podiatrist if continues getting worse.   Lateral epicondylitis of left elbow We discussed treatment options, she agrees with trying PT.  She would benefit from iontophoresis. Also  recommend trying to avoid pronation wrist movement,keeping hand in supination position as much as possible. Local ice om lateral epicondyle may also help.  -     Ambulatory referral to Physical Therapy  Return in about 6 weeks (around 10/04/2018).     Byrdie Miyazaki G. [place], MD  [facility]. [place] office.

## 2018-08-25 ENCOUNTER — Encounter: Payer: Self-pay | Admitting: Family Medicine

## 2018-08-26 ENCOUNTER — Ambulatory Visit: Payer: Self-pay | Admitting: Pulmonary Disease

## 2018-08-27 ENCOUNTER — Telehealth: Payer: Self-pay | Admitting: *Deleted

## 2018-08-27 DIAGNOSIS — N201 Calculus of ureter: Secondary | ICD-10-CM | POA: Diagnosis not present

## 2018-08-27 DIAGNOSIS — R34 Anuria and oliguria: Secondary | ICD-10-CM | POA: Diagnosis not present

## 2018-08-27 MED ORDER — TRIAMCINOLONE ACETONIDE 40 MG/ML IJ SUSP
40.0000 mg | Freq: Once | INTRAMUSCULAR | Status: AC
  Administered 2018-08-23: 40 mg via INTRA_ARTICULAR

## 2018-08-27 NOTE — Telephone Encounter (Signed)
Spoke with patient and informed her that she would have to contact insurance company to find out what they covered and then let us know so we could order medication. Patient verbalized understanding. Patient also asked for a copy of her x-ray report of left foot, patient aware that disc has been made and would be at front desk for pick-up.

## 2018-08-27 NOTE — Telephone Encounter (Signed)
Copied from [place] [id number]. Topic: General - Other >> Aug 27, 2018 12:30 PM Yvette Rack wrote: Reason for CRM: pt calling stating that her insurance want cover the Milnacipran HCl (SAVELLA TITRATION PACK) 12.5 & 25 & 50 MG MISC  its not in her plan

## 2018-08-27 NOTE — Addendum Note (Signed)
Addended by: Zacarias Pontes on: 08/27/2018 12:20 PM   Modules accepted: Orders

## 2018-08-30 ENCOUNTER — Ambulatory Visit: Payer: Self-pay | Admitting: Family Medicine

## 2018-09-04 NOTE — Telephone Encounter (Signed)
Because some of the medication she is taking, I did not recommend duloxetine.  If her insurance does not cover Savella, we could increase Lyrica from 220 mg twice daily to the max dose of 300 mg twice daily. Thanks, BJ

## 2018-09-04 NOTE — Telephone Encounter (Signed)
Pt contacted her insurance company and the pharmacy will cover the duloxetine capsules. They did state if the provider prefers pt to only take the savella then prior auth can be submitted. Please advise.  Pt also states that her height in the system is wrong. It states she is 5'2". Pt states she is 5'7". Please update.

## 2018-09-06 DIAGNOSIS — M792 Neuralgia and neuritis, unspecified: Secondary | ICD-10-CM | POA: Diagnosis not present

## 2018-09-06 DIAGNOSIS — M79672 Pain in left foot: Secondary | ICD-10-CM | POA: Diagnosis not present

## 2018-09-06 DIAGNOSIS — B351 Tinea unguium: Secondary | ICD-10-CM | POA: Diagnosis not present

## 2018-09-06 DIAGNOSIS — L602 Onychogryphosis: Secondary | ICD-10-CM | POA: Diagnosis not present

## 2018-09-06 DIAGNOSIS — G5762 Lesion of plantar nerve, left lower limb: Secondary | ICD-10-CM | POA: Diagnosis not present

## 2018-09-06 DIAGNOSIS — M79671 Pain in right foot: Secondary | ICD-10-CM | POA: Diagnosis not present

## 2018-09-09 DIAGNOSIS — R69 Illness, unspecified: Secondary | ICD-10-CM | POA: Diagnosis not present

## 2018-09-09 NOTE — Telephone Encounter (Signed)
Patient would like to try the Lyrica 300 mg twice daily.  [place]. Okay to call in?

## 2018-09-10 ENCOUNTER — Other Ambulatory Visit: Payer: Self-pay | Admitting: Family Medicine

## 2018-09-10 MED ORDER — PREGABALIN 300 MG PO CAPS: 300.0000 mg | ORAL_CAPSULE | Freq: Two times a day (BID) | ORAL | 0 refills | Status: DC

## 2018-09-10 NOTE — Telephone Encounter (Signed)
Prescription for Lyrica 300 mg was sent to her pharmacy to continue twice daily. Thanks, BJ

## 2018-09-12 ENCOUNTER — Ambulatory Visit: Payer: Medicare HMO | Admitting: Physical Therapy

## 2018-09-13 ENCOUNTER — Ambulatory Visit (INDEPENDENT_AMBULATORY_CARE_PROVIDER_SITE_OTHER): Payer: Medicare HMO | Admitting: Pulmonary Disease

## 2018-09-13 ENCOUNTER — Ambulatory Visit: Payer: Medicare HMO | Admitting: Pulmonary Disease

## 2018-09-13 ENCOUNTER — Encounter: Payer: Self-pay | Admitting: Pulmonary Disease

## 2018-09-13 VITALS — BP 120/70 | HR 82 | Ht 62.25 in | Wt 211.0 lb

## 2018-09-13 DIAGNOSIS — Z23 Encounter for immunization: Secondary | ICD-10-CM | POA: Diagnosis not present

## 2018-09-13 DIAGNOSIS — R053 Chronic cough: Secondary | ICD-10-CM

## 2018-09-13 DIAGNOSIS — R05 Cough: Secondary | ICD-10-CM

## 2018-09-13 DIAGNOSIS — G4733 Obstructive sleep apnea (adult) (pediatric): Secondary | ICD-10-CM

## 2018-09-13 DIAGNOSIS — G4730 Sleep apnea, unspecified: Secondary | ICD-10-CM | POA: Diagnosis not present

## 2018-09-13 DIAGNOSIS — E669 Obesity, unspecified: Secondary | ICD-10-CM

## 2018-09-13 DIAGNOSIS — Z720 Tobacco use: Secondary | ICD-10-CM | POA: Diagnosis not present

## 2018-09-13 DIAGNOSIS — Z9989 Dependence on other enabling machines and devices: Secondary | ICD-10-CM | POA: Diagnosis not present

## 2018-09-13 LAB — PULMONARY FUNCTION TEST
DL/VA % PRED: 98 %
DL/VA: 4.88 ml/min/mmHg/L
DLCO UNC: 28.42 ml/min/mmHg
DLCO unc % pred: 109 %
FEF 25-75 POST: 4.12 L/s
FEF 25-75 Pre: 3.92 L/sec
FEF2575-%Change-Post: 4 %
FEF2575-%Pred-Post: 134 %
FEF2575-%Pred-Pre: 128 %
FEV1-%CHANGE-POST: 0 %
FEV1-%PRED-PRE: 120 %
FEV1-%Pred-Post: 121 %
FEV1-POST: 3.72 L
FEV1-Pre: 3.7 L
FEV1FVC-%CHANGE-POST: 0 %
FEV1FVC-%Pred-Pre: 103 %
FEV6-%Change-Post: 0 %
FEV6-%PRED-PRE: 117 %
FEV6-%Pred-Post: 117 %
FEV6-PRE: 4.37 L
FEV6-Post: 4.37 L
FEV6FVC-%PRED-POST: 102 %
FEV6FVC-%PRED-PRE: 102 %
FVC-%Change-Post: 0 %
FVC-%PRED-PRE: 115 %
FVC-%Pred-Post: 115 %
FVC-POST: 4.37 L
FVC-PRE: 4.37 L
POST FEV6/FVC RATIO: 100 %
PRE FEV1/FVC RATIO: 84 %
PRE FEV6/FVC RATIO: 100 %
Post FEV1/FVC ratio: 85 %
RV % PRED: 32 %
RV: 0.56 L
TLC % PRED: 102 %
TLC: 5.36 L

## 2018-09-13 NOTE — Progress Notes (Signed)
[facility]  Chief Complaint  Patient presents with  . Follow-up    2 month follow up with PFT. States her breathing has been ok since last visit. Developed a sore throat a few days ago.     Constitutional:  BP 120/70   Pulse 82   Ht 5' 2.25" (1.581 m)   Wt 211 lb (95.7 kg)   LMP 04/30/2018   SpO2 98%   BMI 38.28 kg/m   Past Medical History:  Anxiety, Bipolar, CKD, Depression, Fibromyalgia, GERD, Nephrolithiasis, HLD, HTN, Migraine HA, OA, Polycystic ovaries, PNA  Brief Summary:  [name] [name] is a 47 y.o. female smoker with OSA and cough.  She is still smoking.  She thinks she can quit on her own.  She has occasional cough.  Not needing albuterol.  Not having fever, chest pain, sinus congestion, wheeze, sputum, hemoptysis, reflux, skin rash, or leg swelling.  Got new CPAP mask and using chin strap.  This works better, but having dry mouth and sore throat.  Physical Exam:   Appearance - well kempt   ENMT - clear nasal mucosa, midline nasal  septum, no oral exudates, no LAN, trachea midline  Respiratory - normal chest wall, normal respiratory effort, no accessory muscle use, no wheeze/rales  CV - s1s2 regular rate and rhythm, no murmurs, no peripheral edema, radial pulses symmetric  GI - soft, non tender, no masses  Lymph - no adenopathy noted in neck and axillary areas  MSK - normal gait  Ext - no cyanosis, clubbing, or joint inflammation noted  Skin - no rashes, lesions, or ulcers  Neuro - normal strength, oriented x 3  Psych - normal mood and affect   Assessment/Plan:   Cough. - fortunately PFT was unremarkable - continue prn albuterol  Obstructive sleep apnea. - she reports better response with change in mask - she reports compliance with CPAP - advised her to adjust temperature on humidifier  - will get copy of her download  Tobacco abuse. - discussed options to assist with smoking cessation - she  will try quitting on her own  Obesity. - discussed importance of weight loss   Patient Instructions  Follow up in 6 months    Chesley Mires, MD [place] Pager: [phone] 09/13/2018, 3:23 PM  Flow Sheet     Pulmonary tests:  PFT 09/13/18 >> FEV1 3.72 (121), FEV1% 85, TLC 5.36 (102%), DLCO 109%, no BD  Sleep tests:  PSG 06/12/17 >> AHI 50, SpO2 low 88%   Medications:   Allergies as of 09/13/2018      Reactions   Aspirin Shortness Of Breath, Other (See Comments)   Angiodema   Nsaids Anaphylaxis   Swelling of eyes mouth and throat difficulty breathing   Naproxen       Medication List        Accurate as of 09/13/18  3:23 PM. Always use your most recent med list.          acetaminophen 325 MG tablet Commonly known as:  TYLENOL Take 2 tablets (650 mg total) by mouth every 4 (four) hours as needed for mild pain or moderate pain.   albuterol 108 (90 Base) MCG/ACT inhaler Commonly known as:  PROVENTIL HFA;VENTOLIN HFA Inhale 2 puffs into the lungs every 6 (six) hours as needed for wheezing or shortness of breath.   benztropine 0.5 MG tablet Commonly known as:  COGENTIN Take 0.5 mg by mouth 2 (two) times daily.  buPROPion 150 MG 12 hr tablet Commonly known as:  WELLBUTRIN SR Take 150 mg by mouth 2 (two) times daily.   carvedilol 12.5 MG tablet Commonly known as:  COREG Take 1 tablet (12.5 mg total) by mouth 2 (two) times daily with a meal.   doxycycline 100 MG tablet Commonly known as:  VIBRA-TABS TAKE 1 TABLET BY MOUTH DAILY   Fish Oil 1000 MG Caps Take by mouth 3 (three) times daily.   hydrOXYzine 10 MG tablet Commonly known as:  ATARAX/VISTARIL Take 10 mg by mouth 3 (three) times daily as needed.   lamoTRIgine 100 MG tablet Commonly known as:  LAMICTAL Take 100 mg by mouth 2 (two) times daily.   LATUDA 20 MG Tabs tablet Generic drug:  lurasidone Take 20 mg by mouth every evening.   omeprazole 40 MG capsule Commonly  known as:  PRILOSEC Take 1 capsule (40 mg total) by mouth daily.   pregabalin 300 MG capsule Commonly known as:  LYRICA Take 1 capsule (300 mg total) by mouth 2 (two) times daily.   PRENATAL VITAMIN PO Take 1 tablet by mouth at bedtime.   traZODone 50 MG tablet Commonly known as:  DESYREL Take 100 mg by mouth at bedtime as needed for sleep.   Vitamin D (Ergocalciferol) 50000 units Caps capsule Commonly known as:  DRISDOL Take 50,000 Units by mouth every 7 (seven) days. On Mondays       Past Surgical History:  She  has a past surgical history that includes Wisdom tooth extraction; Colonoscopy (last one 11-08-2006); Cholecystectomy (N/A, 03/13/2013); Anterior cervical decomp/discectomy fusion (N/A, 09/01/2015); Extracorporeal shock wave lithotripsy (Right, 04/23/2017); Extracorporeal shock wave lithotripsy (Right, 04/23/2017); DX LAPAROSCOPY W/ LYSIS ADHESIONS/  LASER VAPORIZATION OF CERVIX (06/25/2002); EXPLORATORY LEFT THUMB REPAIR TENDON/ LACERATION (09/27/1999); Cardiovascular stress test (04/27/2009); laparoscopy (07/30/2012); Esophagogastroduodenoscopy (04/25/2005); Cystoscopy with retrograde pyelogram, ureteroscopy and stent placement (Right, 05/18/2017); Holmium laser application (Right, 1/91/4782); Breast excisional biopsy (Bilateral, left 10/15/2002;   right 1997); Laparoscopic vaginal hysterectomy with salpingectomy (Bilateral, 05/08/2018); and Cystoscopy (N/A, 05/08/2018).  Family History:  Her family history includes Arthritis in her father, maternal grandmother, mother, and paternal grandmother; Asthma in her paternal grandmother; COPD in her maternal grandmother, paternal grandfather, paternal grandmother, and son; Cancer in her paternal grandmother; Coronary artery disease in her unknown relative; Depression in her father, maternal grandmother, mother, and sister; Diabetes in her father and mother; Drug abuse in her son; Early death in her maternal grandfather; Emphysema in her  unknown relative; Hearing loss in her father and paternal grandmother; Heart attack in her maternal grandfather, paternal grandfather, and paternal grandmother; Heart disease in her maternal grandmother and paternal grandmother; Hyperlipidemia in her paternal grandmother; Hypertension in her father, mother, and paternal grandmother; Kidney disease in her paternal grandmother; Stroke in her paternal grandmother; Tongue cancer in her unknown relative.  Social History:  She  reports that she has been smoking cigarettes. She has a 25.00 pack-year smoking history. She has never used smokeless tobacco. She reports that she does not drink alcohol or use drugs.

## 2018-09-13 NOTE — Patient Instructions (Signed)
Follow up in 6 months 

## 2018-09-13 NOTE — Progress Notes (Signed)
PFT completed today.  

## 2018-09-17 DIAGNOSIS — B351 Tinea unguium: Secondary | ICD-10-CM | POA: Diagnosis not present

## 2018-09-25 ENCOUNTER — Ambulatory Visit (INDEPENDENT_AMBULATORY_CARE_PROVIDER_SITE_OTHER): Payer: Medicare HMO | Admitting: Family Medicine

## 2018-09-25 ENCOUNTER — Encounter: Payer: Self-pay | Admitting: Family Medicine

## 2018-09-25 ENCOUNTER — Other Ambulatory Visit (HOSPITAL_COMMUNITY)
Admission: RE | Admit: 2018-09-25 | Discharge: 2018-09-25 | Disposition: A | Discharge status: Home / Self Care | Payer: Medicare HMO | Source: Ambulatory Visit | Attending: Family Medicine | Admitting: Family Medicine

## 2018-09-25 VITALS — BP 124/82 | HR 82 | Temp 99.0°F | Resp 12 | Ht 65.0 in | Wt 213.1 lb

## 2018-09-25 DIAGNOSIS — M797 Fibromyalgia: Secondary | ICD-10-CM

## 2018-09-25 DIAGNOSIS — R059 Cough, unspecified: Secondary | ICD-10-CM

## 2018-09-25 DIAGNOSIS — N898 Other specified noninflammatory disorders of vagina: Secondary | ICD-10-CM

## 2018-09-25 DIAGNOSIS — K146 Glossodynia: Secondary | ICD-10-CM

## 2018-09-25 DIAGNOSIS — Z113 Encounter for screening for infections with a predominantly sexual mode of transmission: Secondary | ICD-10-CM

## 2018-09-25 DIAGNOSIS — R69 Illness, unspecified: Secondary | ICD-10-CM | POA: Diagnosis not present

## 2018-09-25 DIAGNOSIS — R05 Cough: Secondary | ICD-10-CM

## 2018-09-25 LAB — VITAMIN B12: VITAMIN B 12: 223 pg/mL (ref 211–911)

## 2018-09-25 MED ORDER — PREGABALIN 300 MG PO CAPS: 300.0000 mg | ORAL_CAPSULE | Freq: Two times a day (BID) | ORAL | 2 refills | Status: DC

## 2018-09-25 MED ORDER — ALBUTEROL SULFATE HFA 108 (90 BASE) MCG/ACT IN AERS: 2.0000 | INHALATION_SPRAY | Freq: Four times a day (QID) | RESPIRATORY_TRACT | 1 refills | Status: DC | PRN

## 2018-09-25 MED ORDER — TERCONAZOLE 0.4 % VA CREA: 1.0000 | TOPICAL_CREAM | Freq: Every day | VAGINAL | 1 refills | Status: DC

## 2018-09-25 MED ORDER — FLUCONAZOLE 150 MG PO TABS: 150.0000 mg | ORAL_TABLET | Freq: Once | ORAL | 0 refills | Status: AC

## 2018-09-25 NOTE — Progress Notes (Signed)
HPI:  Chief Complaint  Patient presents with  . Follow-up  . Sore tongue    started about 2 weeks ago, burning sensation and painful when eating  . Cough  . Chest congestion    [name] [name] is a 47 y.o. female, who is here today to follow on last office visit.  She was last seen on 08/23/2018, at that time we started Salmonella to help with fibromyalgia pain.  Her insurance did not cover Savella, so we increased Lyrica from 220 mg 3 times daily to 300 mg twice daily. She has noted great improvement in her generalized pain, she is again more "forgetful" than usual, which was the reason Lyrica was decreased a couple months ago.  She denies side effects, including worsening depression.  She has follow with her psychiatrist since her last visit.  Today she is complaining of "sore tongue", that started about 10 days ago, a few days after she started BuSpar. BuSpar was discontinued because she felt "horrible." Soreness/burning sensation is constant, 5/10, exacerbated by eating or drinking. No alleviating factors.  She has not noted oral lesions. She denies associated sore throat, dysphagia, or odynophagia.  She denies prior history of tongue soreness. No Hx of trauma. She has not noted numbness or tingling.   Lab Results  Component Value Date   [id number] 702 05/06/2013   She is also complaining of 2 weeks of vaginal pruritus, she thinks she might have a yeast infection. She has been sexually active with her ex-husband, last time was about 5 months ago. She has not noted vaginal discharge or bleeding.  She follows with gynecologist.  She is also c/o productive cough,worse in the morning, "little black" sputum,denies hemoptysis. Negative for chest pain or dyspnea. Intermittent voice changes and post nasal drain age. Hx of allergic rhinitis. She uses Albuterol inh prn. Denies dysphagia or stridor. Occasional wheezing. She has not identified exacerbating or  alleviating factors. No fever or chills.  + Smoker. Hx of GERD. Recently followed with Dr Halford Chessman, had PFT's done.   Review of Systems  Constitutional: Positive for fatigue. Negative for activity change, appetite change and fever.  HENT: Positive for postnasal drip and rhinorrhea. Negative for mouth sores, nosebleeds, sore throat and trouble swallowing.   Eyes: Negative for redness and visual disturbance.  Respiratory: Positive for cough and wheezing. Negative for shortness of breath.   Cardiovascular: Negative for chest pain and leg swelling.  Gastrointestinal: Negative for abdominal pain, nausea and vomiting.       Negative for changes in bowel habits.  Genitourinary: Negative for decreased urine volume, dysuria, hematuria, vaginal bleeding and vaginal discharge.  Musculoskeletal: Positive for arthralgias and myalgias.  Skin: Negative for rash.  Allergic/Immunologic: Positive for environmental allergies.  Neurological: Negative for seizures, syncope, weakness and headaches.  Psychiatric/Behavioral: Negative for confusion. The patient is nervous/anxious.       Current Outpatient Medications on File Prior to Visit  Medication Sig Dispense Refill  . acetaminophen (TYLENOL) 325 MG tablet Take 2 tablets (650 mg total) by mouth every 4 (four) hours as needed for mild pain or moderate pain. 30 tablet 1  . benztropine (COGENTIN) 0.5 MG tablet Take 0.5 mg by mouth 2 (two) times daily.     Marland Kitchen buPROPion (WELLBUTRIN SR) 150 MG 12 hr tablet Take 150 mg by mouth 2 (two) times daily.    . carvedilol (COREG) 12.5 MG tablet Take 1 tablet (12.5 mg total) by mouth 2 (two) times  daily with a meal. (Patient taking differently: Take 12.5-25 mg by mouth every 12 (twelve) hours. ) 180 tablet 1  . hydrOXYzine (ATARAX/VISTARIL) 10 MG tablet Take 10 mg by mouth 3 (three) times daily as needed.    . lamoTRIgine (LAMICTAL) 100 MG tablet Take 100 mg by mouth 2 (two) times daily.    Marland Kitchen lurasidone (LATUDA) 20 MG TABS  tablet Take 20 mg by mouth every evening.    . Omega-3 Fatty Acids (FISH OIL) 1000 MG CAPS Take by mouth 3 (three) times daily.     Marland Kitchen omeprazole (PRILOSEC) 40 MG capsule Take 1 capsule (40 mg total) by mouth daily. 90 capsule 3  . Prenatal Vit-Fe Fumarate-FA (PRENATAL VITAMIN PO) Take 1 tablet by mouth at bedtime.    . traZODone (DESYREL) 50 MG tablet Take 100 mg by mouth at bedtime as needed for sleep.     . Vitamin D, Ergocalciferol, (DRISDOL) 50000 units CAPS capsule Take 50,000 Units by mouth every 7 (seven) days. On Mondays     No current facility-administered medications on file prior to visit.      Past Medical History:  Diagnosis Date  . Anxiety   . Bipolar disorder ([place])   . Chronic kidney disease    kidney infections  . Depression   . Fibromyalgia   . GERD (gastroesophageal reflux disease)   . History of cervical dysplasia   . History of exercise intolerance    normal ETT 10-24-2016  . History of kidney stones   . Hyperlipidemia   . Hypertension   . Migraines   . Osteoarthritis   . PCOS (polycystic ovarian syndrome)   . Pneumonia 2018  . Rash    in area of eswl 04-23-2017  . Right ureteral calculus   . Sleep apnea   . Tachycardia cardiologist-  dr harding   controlled w/ metoprolol   Allergies  Allergen Reactions  . Aspirin Shortness Of Breath and Other (See Comments)    Angiodema  . Nsaids Anaphylaxis    Swelling of eyes mouth and throat difficulty breathing  . Naproxen     Social History   Socioeconomic History  . Marital status: Legally Separated    Spouse name: Not on file  . Number of children: 2  . Years of education: Not on file  . Highest education level: Not on file  Occupational History  . Occupation: Unemployed  Social Needs  . Financial resource strain: Not on file  . Food insecurity:    Worry: Not on file    Inability: Not on file  . Transportation needs:    Medical: Not on file    Non-medical: Not on file  Tobacco Use  . Smoking  status: Current Some Day Smoker    Packs/day: 1.00    Years: 25.00    Pack years: 25.00    Types: Cigarettes  . Smokeless tobacco: Never Used  . Tobacco comment: 6-7 cig. daily  Substance and Sexual Activity  . Alcohol use: No  . Drug use: No    Types: Cocaine    Comment: last cocaine use 2014  . Sexual activity: Not Currently    Partners: Male    Birth control/protection: None  Lifestyle  . Physical activity:    Days per week: Not on file    Minutes per session: Not on file  . Stress: Not on file  Relationships  . Social connections:    Talks on phone: Not on file    Gets together: Not  on file    Attends religious service: Not on file    Active member of club or organization: Not on file    Attends meetings of clubs or organizations: Not on file    Relationship status: Not on file  Other Topics Concern  . Not on file  Social History Narrative   She lives at home with her spouse. She does not exercise.   She currently denies recent recreational drug use.    Vitals:   09/25/18 1005  BP: 124/82  Pulse: 82  Resp: 12  Temp: 99 F (37.2 C)  SpO2: 99%   Body mass index is 35.47 kg/m.   Physical Exam  Nursing note and vitals reviewed. Constitutional: She is oriented to person, place, and time. She appears well-developed. No distress.  HENT:  Head: Normocephalic and atraumatic.  Mouth/Throat: Oropharynx is clear and moist and mucous membranes are normal.  Tongue is normal on inspection, tenderness with palpation of lateral aspect and tip of tongue.  Eyes: Pupils are equal, round, and reactive to light. Conjunctivae are normal.  Cardiovascular: Normal rate and regular rhythm.  No murmur heard. Pulses:      Dorsalis pedis pulses are 2+ on the right side, and 2+ on the left side.  Respiratory: Effort normal and breath sounds normal. No respiratory distress.  GI: Soft. She exhibits no mass. There is no hepatomegaly. There is no tenderness.  Genitourinary:    Genitourinary Comments: Deferred to gynecologist.  Musculoskeletal: She exhibits no edema.  Lymphadenopathy:    She has no cervical adenopathy.  Neurological: She is alert and oriented to person, place, and time. She has normal strength. No cranial nerve deficit. Gait normal.  Skin: Skin is warm. No rash noted. No erythema.  Psychiatric:  Flat affect, well groomed, good eye contact.      ASSESSMENT AND PLAN:   [name] [name] was seen today for follow-up, sore tongue, cough and chest congestion.  Diagnoses and all orders for this visit:  Lab Results  Component Value Date   VITAMINB12 223 09/25/2018    Fibromyalgia Generalized pain is better controlled since Lyrica was increased. No changes in current management. We discussed some side effects of Lyrica. Regular low impact physical activity. Follow-up in 3 months.   Soreness of tongue  Possible etiologies discussed. I do not think it is caused by thrush. Continue monitoring for now.  -     Vitamin B12  Vaginal pruritus  Empiric treatment for fungal etiology recommended. Further recommendations will be given according to lab result. F/U with gyn as needed.  -     terconazole (TERAZOL 7) 0.4 % vaginal cream; Place 1 applicator vaginally at bedtime. -     fluconazole (DIFLUCAN) 150 MG tablet; Take 1 tablet (150 mg total) by mouth once for 1 dose.  Cough Possible etiologies discussed:Allergies,s/p URI,GERD,COPD.  Lung auscultation negative. Strongly recommend smoking cessation. I do not think imaging is needed today. Albuterol inh 2 puff every 6 hours for a week then as needed for wheezing or shortness of breath.    Screen for STD (sexually transmitted disease) -     Urine cytology ancillary only     Return in about 3 months (around 12/26/2018).    Khye Hochstetler G. Martinique, MD  [facility]. [place] office.

## 2018-09-25 NOTE — Assessment & Plan Note (Signed)
Generalized pain is better controlled since Lyrica was increased. No changes in current management. We discussed some side effects of Lyrica. Regular low impact physical activity. Follow-up in 3 months.

## 2018-09-25 NOTE — Patient Instructions (Addendum)
A few things to remember from today's visit:   Soreness of tongue - Plan: Vitamin B12  Fibromyalgia  Vaginal pruritus - Plan: terconazole (TERAZOL 7) 0.4 % vaginal cream, fluconazole (DIFLUCAN) 150 MG tablet  Screen for STD (sexually transmitted disease) - Plan: Urine cytology ancillary only  If vaginal itching is not better please follow-up with your gynecologist.   Albuterol inh 2 puff every 6 hours for a week then as needed for wheezing or shortness of breath.  Plain Mucinex and smoking cessation. Monitor for fever or worsening symptoms.   Please be sure medication list is accurate. If a new problem present, please set up appointment sooner than planned today.

## 2018-09-26 LAB — URINE CYTOLOGY ANCILLARY ONLY
Chlamydia: NEGATIVE
NEISSERIA GONORRHEA: NEGATIVE
TRICH (WINDOWPATH): NEGATIVE

## 2018-09-27 DIAGNOSIS — Z79899 Other long term (current) drug therapy: Secondary | ICD-10-CM | POA: Diagnosis not present

## 2018-09-29 ENCOUNTER — Encounter: Payer: Self-pay | Admitting: Family Medicine

## 2018-10-04 ENCOUNTER — Ambulatory Visit: Payer: Self-pay | Admitting: Family Medicine

## 2018-10-07 DIAGNOSIS — M79672 Pain in left foot: Secondary | ICD-10-CM | POA: Diagnosis not present

## 2018-10-07 DIAGNOSIS — B351 Tinea unguium: Secondary | ICD-10-CM | POA: Diagnosis not present

## 2018-10-07 DIAGNOSIS — G5762 Lesion of plantar nerve, left lower limb: Secondary | ICD-10-CM | POA: Diagnosis not present

## 2018-10-07 DIAGNOSIS — M792 Neuralgia and neuritis, unspecified: Secondary | ICD-10-CM | POA: Diagnosis not present

## 2018-10-08 ENCOUNTER — Telehealth: Payer: Self-pay | Admitting: Pulmonary Disease

## 2018-10-08 NOTE — Telephone Encounter (Signed)
CPAP 08/28/18 to 09/26/18 >> used on 26 of 30 nights with average 8 hrs 37 min.  Average AHI 0.5 with CPAP 17 cm H2O.   Please let her know that the CPAP report shows good control of sleep apnea with current settings.

## 2018-10-11 NOTE — Telephone Encounter (Signed)
Pt is aware of below message and voiced her understanding. Nothing further is needed. 

## 2018-10-29 DIAGNOSIS — L03031 Cellulitis of right toe: Secondary | ICD-10-CM | POA: Diagnosis not present

## 2018-11-02 ENCOUNTER — Other Ambulatory Visit: Payer: Self-pay | Admitting: Family Medicine

## 2018-11-02 DIAGNOSIS — L0292 Furuncle, unspecified: Secondary | ICD-10-CM

## 2018-11-14 DIAGNOSIS — M79671 Pain in right foot: Secondary | ICD-10-CM | POA: Diagnosis not present

## 2018-11-14 DIAGNOSIS — L03031 Cellulitis of right toe: Secondary | ICD-10-CM | POA: Diagnosis not present

## 2018-11-14 DIAGNOSIS — M84374A Stress fracture, right foot, initial encounter for fracture: Secondary | ICD-10-CM | POA: Diagnosis not present

## 2018-11-16 IMAGING — CR DG ABDOMEN 1V
2 series · 2 of 2 positions shown · non-contrast
Comparison: CT abdomen and pelvis April 20, 2017

CLINICAL DATA: Right ureteral calculus

EXAM:
ABDOMEN - 1 VIEW

[t abdomen supine (1 of 2)]
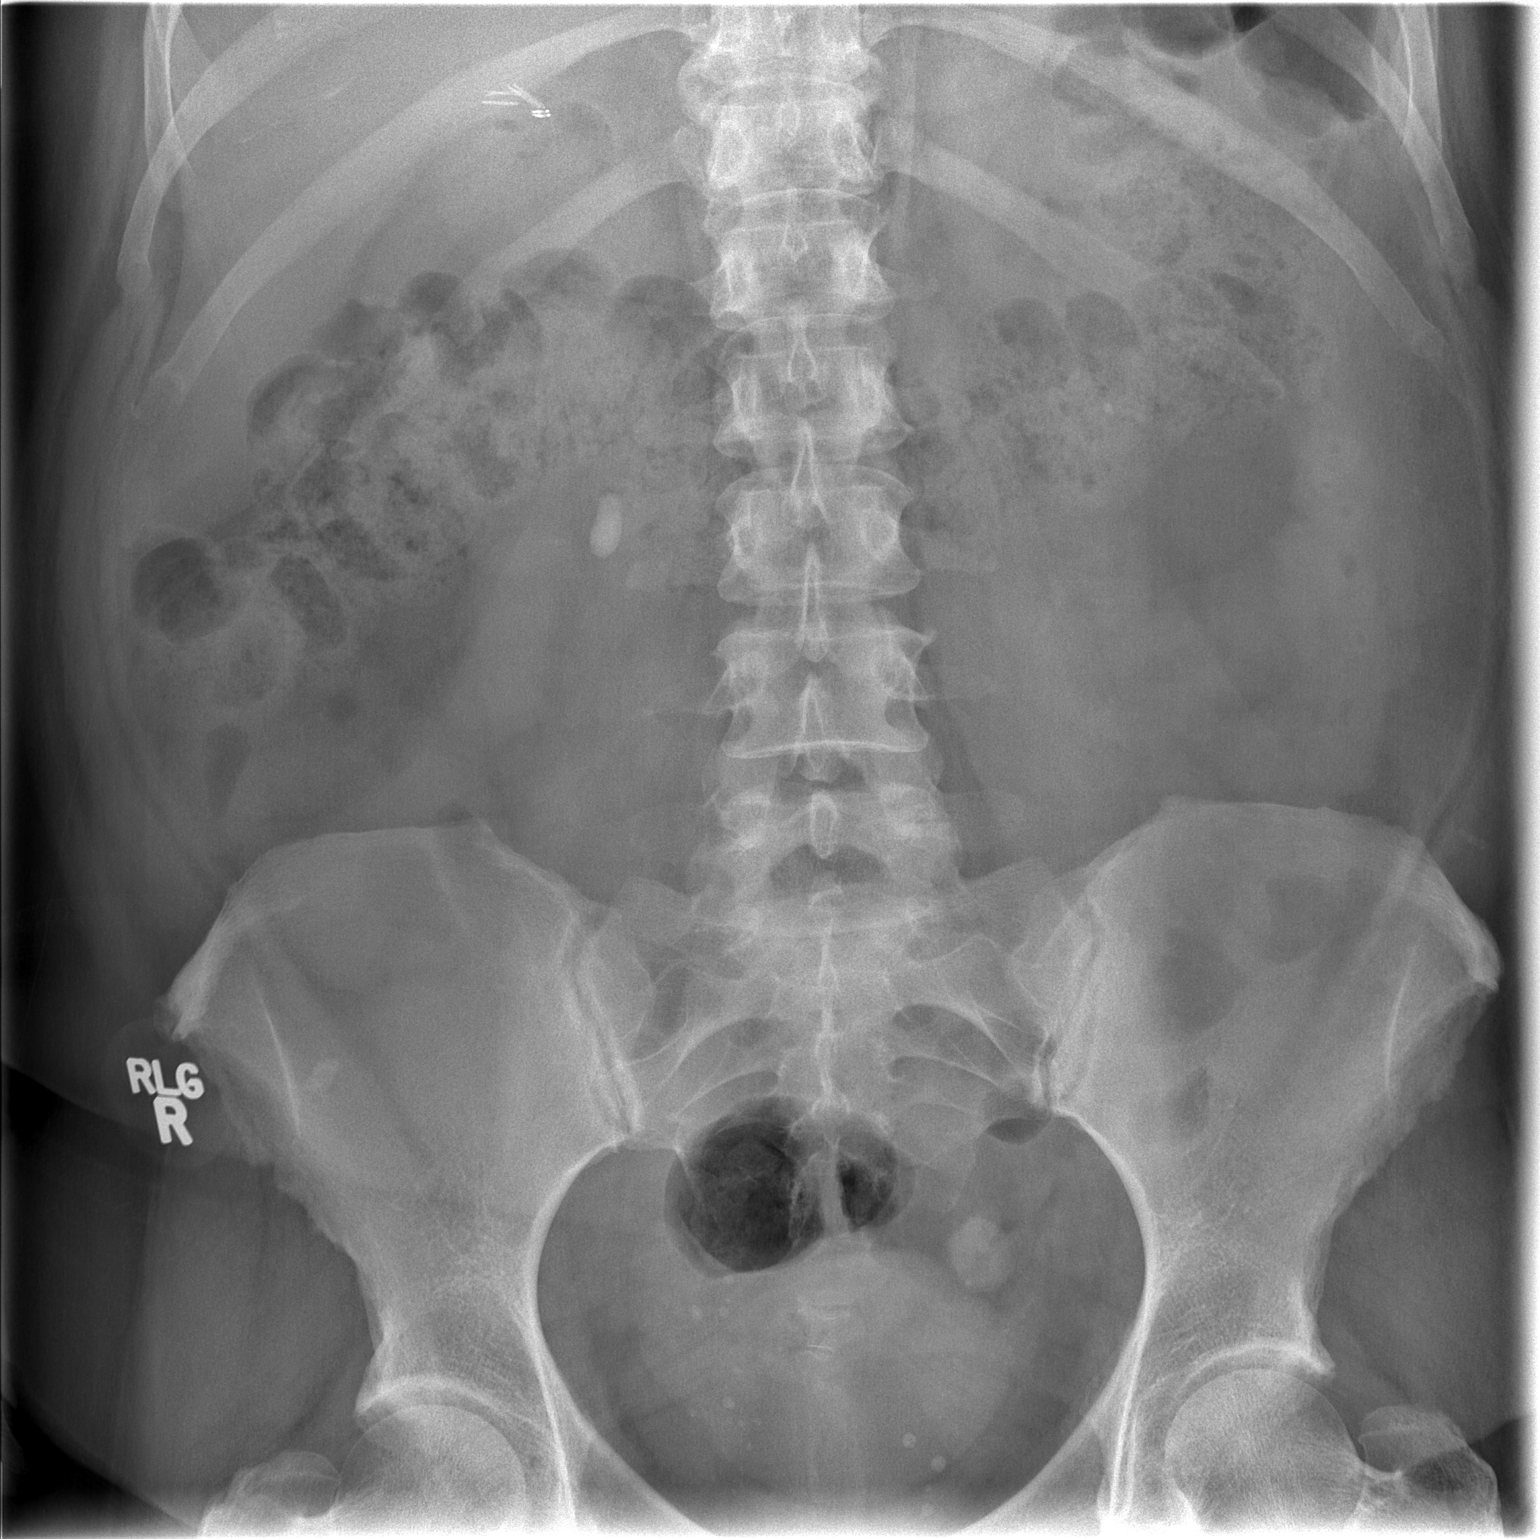

[t abdomen supine (2 of 2)]
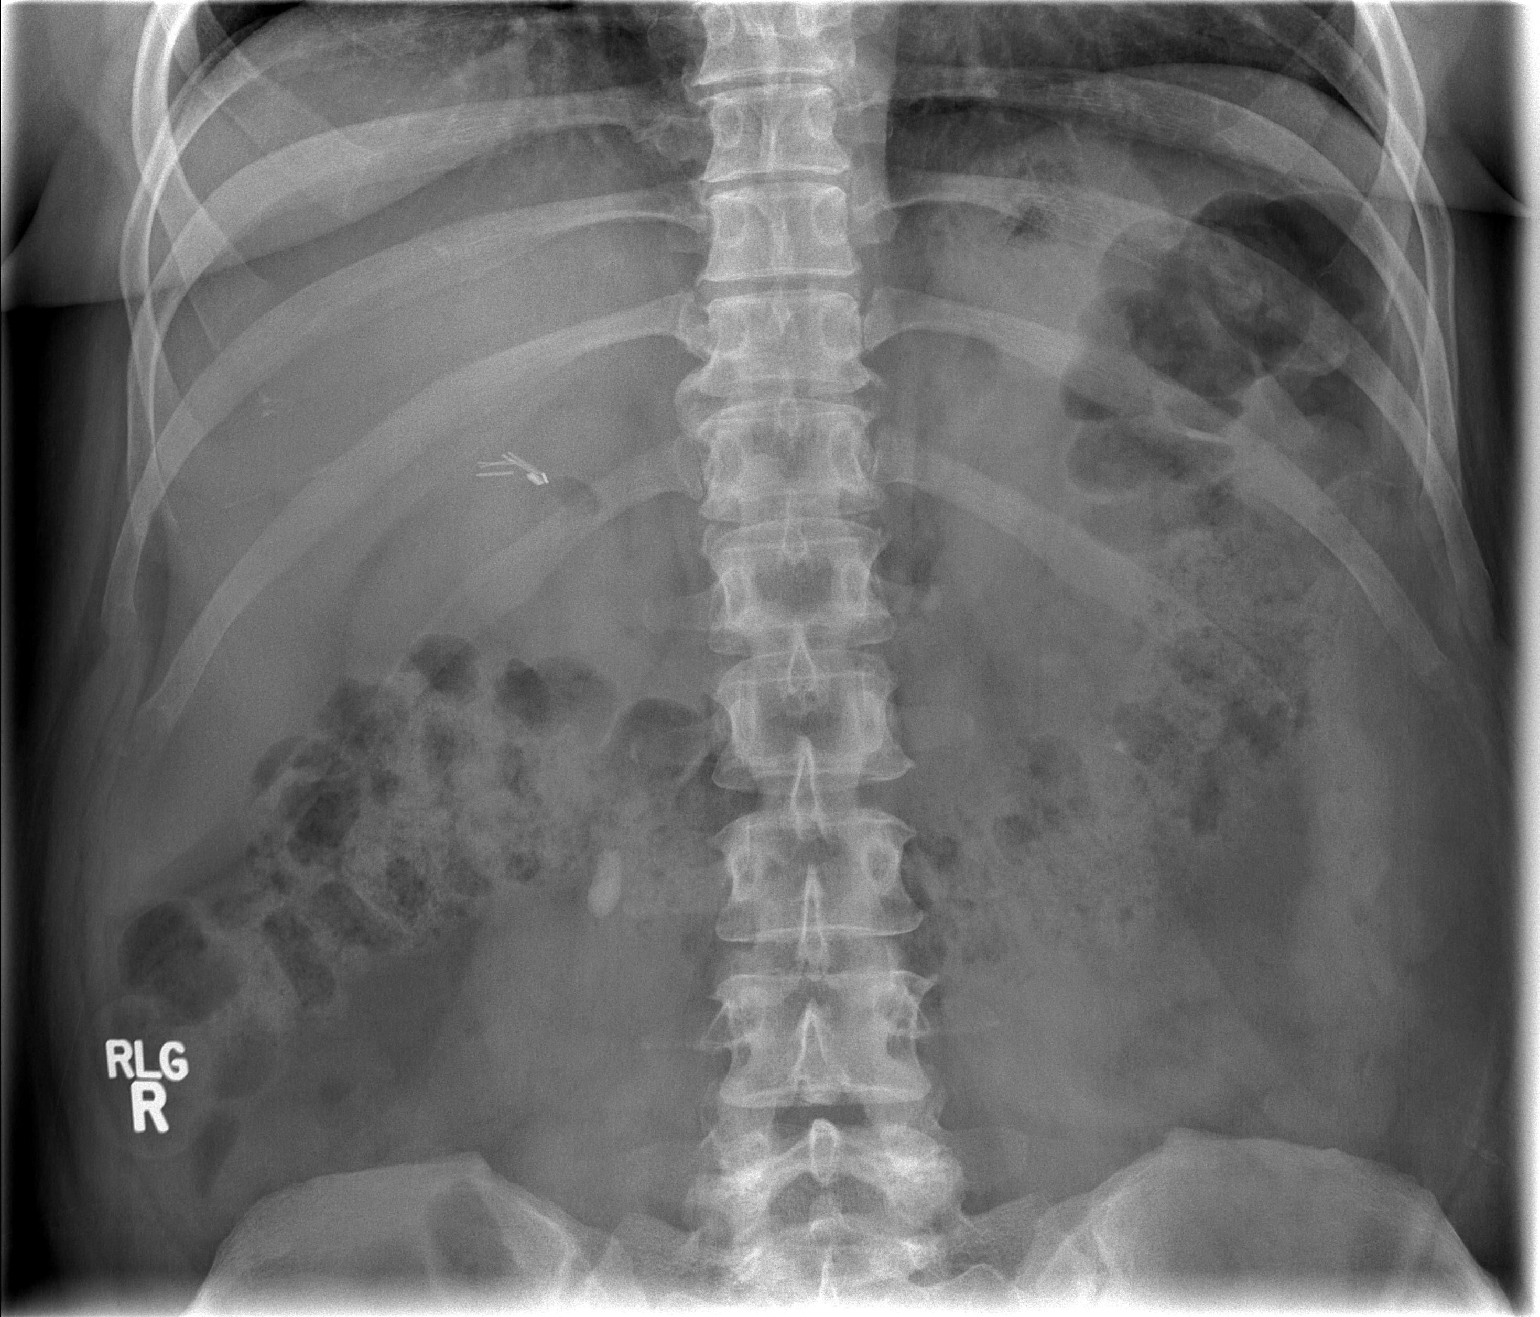

[2 of 2 positions shown; findings below may reference images not displayed]

FINDINGS: There is a calculus on the right at L2-3 measuring 1.8 x 0.8 cm.
There are apparent phleboliths in the pelvis. There is moderate
stool throughout the colon. There is no bowel dilatation or
air-fluid levels suggesting bowel obstruction. No free air.
IMPRESSION: Calculus measuring an 1.8 x 0.8 cm on the right at the L2-3 level.
No bowel obstruction or free air. Apparent phleboliths in the
pelvis.

## 2018-11-18 ENCOUNTER — Other Ambulatory Visit: Payer: Self-pay | Admitting: Family Medicine

## 2018-11-18 DIAGNOSIS — L0292 Furuncle, unspecified: Secondary | ICD-10-CM

## 2018-11-19 DIAGNOSIS — G4733 Obstructive sleep apnea (adult) (pediatric): Secondary | ICD-10-CM | POA: Diagnosis not present

## 2018-11-26 DIAGNOSIS — R69 Illness, unspecified: Secondary | ICD-10-CM | POA: Diagnosis not present

## 2018-11-27 ENCOUNTER — Ambulatory Visit: Payer: Self-pay

## 2018-11-28 DIAGNOSIS — G4733 Obstructive sleep apnea (adult) (pediatric): Secondary | ICD-10-CM | POA: Diagnosis not present

## 2018-12-03 DIAGNOSIS — R69 Illness, unspecified: Secondary | ICD-10-CM | POA: Diagnosis not present

## 2018-12-04 DIAGNOSIS — R69 Illness, unspecified: Secondary | ICD-10-CM | POA: Diagnosis not present

## 2018-12-06 DIAGNOSIS — M19071 Primary osteoarthritis, right ankle and foot: Secondary | ICD-10-CM | POA: Diagnosis not present

## 2018-12-12 ENCOUNTER — Other Ambulatory Visit: Payer: Self-pay | Admitting: Orthopaedic Surgery

## 2018-12-16 ENCOUNTER — Other Ambulatory Visit: Payer: Self-pay | Admitting: Orthopaedic Surgery

## 2018-12-16 DIAGNOSIS — M84374A Stress fracture, right foot, initial encounter for fracture: Secondary | ICD-10-CM

## 2018-12-16 DIAGNOSIS — M19071 Primary osteoarthritis, right ankle and foot: Secondary | ICD-10-CM

## 2018-12-16 DIAGNOSIS — M25771 Osteophyte, right ankle: Secondary | ICD-10-CM

## 2018-12-18 ENCOUNTER — Ambulatory Visit: Payer: Medicare HMO | Admitting: Podiatry

## 2018-12-18 ENCOUNTER — Other Ambulatory Visit: Payer: Self-pay

## 2018-12-18 ENCOUNTER — Encounter: Payer: Self-pay | Admitting: Podiatry

## 2018-12-18 VITALS — BP 131/81 | HR 92

## 2018-12-18 DIAGNOSIS — B351 Tinea unguium: Secondary | ICD-10-CM | POA: Diagnosis not present

## 2018-12-18 DIAGNOSIS — L600 Ingrowing nail: Secondary | ICD-10-CM

## 2018-12-18 DIAGNOSIS — L292 Pruritus vulvae: Secondary | ICD-10-CM | POA: Insufficient documentation

## 2018-12-18 DIAGNOSIS — R399 Unspecified symptoms and signs involving the genitourinary system: Secondary | ICD-10-CM | POA: Insufficient documentation

## 2018-12-18 MED ORDER — TERBINAFINE HCL 250 MG PO TABS: 250.0000 mg | ORAL_TABLET | Freq: Every day | ORAL | 0 refills | Status: DC

## 2018-12-20 DIAGNOSIS — R69 Illness, unspecified: Secondary | ICD-10-CM | POA: Diagnosis not present

## 2018-12-24 ENCOUNTER — Ambulatory Visit
Admission: RE | Admit: 2018-12-24 | Discharge: 2018-12-24 | Disposition: A | Discharge status: Home / Self Care | Payer: Medicare HMO | Source: Ambulatory Visit | Attending: Orthopaedic Surgery | Admitting: Orthopaedic Surgery

## 2018-12-24 DIAGNOSIS — M25771 Osteophyte, right ankle: Secondary | ICD-10-CM

## 2018-12-24 DIAGNOSIS — M19071 Primary osteoarthritis, right ankle and foot: Secondary | ICD-10-CM

## 2018-12-24 DIAGNOSIS — M84374A Stress fracture, right foot, initial encounter for fracture: Secondary | ICD-10-CM

## 2018-12-25 DIAGNOSIS — Z79899 Other long term (current) drug therapy: Secondary | ICD-10-CM | POA: Diagnosis not present

## 2018-12-25 NOTE — Progress Notes (Signed)
   Subjective: Patient presents today for evaluation of pain to the lateral border of the right hallux that began about 5 weeks ago. Patient is concerned for possible ingrown nail. She states she was being treated by another podiatrist for nail fungus and ingrown nails and was given Formula 3 nail lacquer. Touching the toe increases the pain. She has not done anything for treatment at home. Patient presents today for further treatment and evaluation.  Past Medical History:  Diagnosis Date  . Anxiety   . Bipolar disorder ([place])   . Chronic kidney disease    kidney infections  . Depression   . Fibromyalgia   . GERD (gastroesophageal reflux disease)   . History of cervical dysplasia   . History of exercise intolerance    normal ETT 10-24-2016  . History of kidney stones   . Hyperlipidemia   . Hypertension   . Migraines   . Osteoarthritis   . PCOS (polycystic ovarian syndrome)   . Pneumonia 2018  . Rash    in area of eswl 04-23-2017  . Right ureteral calculus   . Sleep apnea   . Tachycardia cardiologist-  dr harding   controlled w/ metoprolol    Objective:  General: Well developed, nourished, in no acute distress, alert and oriented x3   Dermatology: Skin is warm, dry and supple bilateral. Lateral border of the right hallux appears to be erythematous with evidence of an ingrowing nail. Pain on palpation noted to the border of the nail fold. Hyperkeratotic, discolored, thickened, onychodystrophy of the right great toenail. The remaining nails appear unremarkable at this time. There are no open sores, lesions.  Vascular: Dorsalis Pedis artery and Posterior Tibial artery pedal pulses palpable. No lower extremity edema noted.   Neruologic: Grossly intact via light touch bilateral.  Musculoskeletal: Muscular strength within normal limits in all groups bilateral. Normal range of motion noted to all pedal and ankle joints.   Assesement: #1 Paronychia with ingrowing nail lateral border  right hallux  #2 Onychomycosis right great toenail  #3 Incurvated nail  Plan of Care:  1. Patient evaluated.  2. Discussed treatment alternatives and plan of care. Explained nail avulsion procedure and post procedure course to patient. 3. Patient opted for temporary partial nail avulsion of the lateral border of the right hallux.  4. Prior to procedure, local anesthesia infiltration utilized using 3 ml of a 50:50 mixture of 2% plain lidocaine and 0.5% plain marcaine in a normal hallux block fashion and a betadine prep performed.  5. Light dressing applied. 6. Prescription for Lamisil 250 mg #90 provided to patient.  7. Continue using Formula 3 antifungal nail lacquer.  8. Return to clinic in 3 weeks.   Goes by [employer].   Edrick Kins, DPM [facility]  Dr. Edrick Kins, [place]                                        [place], [place] [address]                Office [phone]  Fax [phone]

## 2018-12-26 ENCOUNTER — Ambulatory Visit
Admission: RE | Admit: 2018-12-26 | Discharge: 2018-12-26 | Disposition: A | Discharge status: Home / Self Care | Payer: Medicare HMO | Source: Ambulatory Visit | Attending: Orthopaedic Surgery | Admitting: Orthopaedic Surgery

## 2018-12-26 DIAGNOSIS — S93401A Sprain of unspecified ligament of right ankle, initial encounter: Secondary | ICD-10-CM | POA: Diagnosis not present

## 2018-12-26 DIAGNOSIS — M19071 Primary osteoarthritis, right ankle and foot: Secondary | ICD-10-CM | POA: Diagnosis not present

## 2018-12-27 ENCOUNTER — Encounter: Payer: Self-pay | Admitting: Family Medicine

## 2018-12-27 ENCOUNTER — Ambulatory Visit (INDEPENDENT_AMBULATORY_CARE_PROVIDER_SITE_OTHER): Payer: Medicare HMO | Admitting: Family Medicine

## 2018-12-27 VITALS — BP 124/82 | HR 84 | Temp 98.4°F | Resp 12 | Ht 67.87 in | Wt 228.2 lb

## 2018-12-27 DIAGNOSIS — F3130 Bipolar disorder, current episode depressed, mild or moderate severity, unspecified: Secondary | ICD-10-CM

## 2018-12-27 DIAGNOSIS — M797 Fibromyalgia: Secondary | ICD-10-CM | POA: Diagnosis not present

## 2018-12-27 DIAGNOSIS — L0292 Furuncle, unspecified: Secondary | ICD-10-CM | POA: Diagnosis not present

## 2018-12-27 DIAGNOSIS — R69 Illness, unspecified: Secondary | ICD-10-CM | POA: Diagnosis not present

## 2018-12-27 MED ORDER — SULFAMETHOXAZOLE-TRIMETHOPRIM 800-160 MG PO TABS: 1.0000 | ORAL_TABLET | Freq: Two times a day (BID) | ORAL | 0 refills | Status: AC

## 2018-12-27 NOTE — Progress Notes (Signed)
HPI:   [name] [name] is a 48 y.o. female, who is here today for 3 months follow up.   She was last seen on 09/25/2018.  Since her last visit she has followed with psychiatrist, 3 weeks ago she was started on lithium.  She has a follow-up appointment next week.  She has not noted major changes in her mood. Negative for suicidal thoughts.  Fibromyalgia, currently she is on Lyrica 300 mg twice daily, which has helped with generalized myalgias. Pain is moderate to severe, she thinks she is doing much better with Lyrica. Stated that she is having a "hard time" during the night and in the morning, when pain is worse. Pain interferes with his sleep.  She is also complaining of recurrent "boils." A few months ago she was on doxycycline 100 mg twice daily for 3-4 months, states that even though the amount of lesions decreased it did not resolve her problem. She is not sure about exacerbating or alleviating factors. Lesions are very tender, erythematous, resolved after she drains them,leaving a residual hyperpigmented area. She has not noted fever or chills.  In the past she used mupirocin but it did not help much.    Review of Systems  Constitutional: Positive for fatigue.  Cardiovascular: Negative for chest pain and palpitations.  Musculoskeletal: Positive for arthralgias, back pain and myalgias.  Skin: Positive for rash.  Neurological: Negative for syncope and headaches.  Psychiatric/Behavioral: Positive for sleep disturbance. The patient is nervous/anxious.     Current Outpatient Medications on File Prior to Visit  Medication Sig Dispense Refill  . acetaminophen (TYLENOL) 325 MG tablet Take 2 tablets (650 mg total) by mouth every 4 (four) hours as needed for mild pain or moderate pain. 30 tablet 1  . albuterol (VENTOLIN HFA) 108 (90 Base) MCG/ACT inhaler Inhale 2 puffs into the lungs every 6 (six) hours as needed for wheezing or shortness of breath. 1 Inhaler 1  .  benztropine (COGENTIN) 0.5 MG tablet Take 0.5 mg by mouth 2 (two) times daily.     Marland Kitchen buPROPion (WELLBUTRIN SR) 150 MG 12 hr tablet Take 150 mg by mouth 2 (two) times daily.    . carvedilol (COREG) 12.5 MG tablet Take 1 tablet (12.5 mg total) by mouth 2 (two) times daily with a meal. (Patient taking differently: Take 12.5-25 mg by mouth every 12 (twelve) hours. ) 180 tablet 1  . hydrOXYzine (ATARAX/VISTARIL) 10 MG tablet Take 10 mg by mouth 3 (three) times daily as needed.    . lamoTRIgine (LAMICTAL) 100 MG tablet Take 100 mg by mouth 2 (two) times daily.    Marland Kitchen lithium carbonate 300 MG capsule     . lurasidone (LATUDA) 20 MG TABS tablet Take 20 mg by mouth every evening.    . Omega-3 Fatty Acids (FISH OIL) 1000 MG CAPS Take by mouth 3 (three) times daily.     Marland Kitchen omeprazole (PRILOSEC) 40 MG capsule Take 1 capsule (40 mg total) by mouth daily. 90 capsule 3  . pregabalin (LYRICA) 300 MG capsule Take 1 capsule (300 mg total) by mouth 2 (two) times daily. 60 capsule 2  . Prenatal Vit-Fe Fumarate-FA (PRENATAL VITAMIN PO) Take 1 tablet by mouth at bedtime.    . terbinafine (LAMISIL) 250 MG tablet Take 1 tablet (250 mg total) by mouth daily. 90 tablet 0  . terconazole (TERAZOL 7) 0.4 % vaginal cream Place 1 applicator vaginally at bedtime. 45 g 1  . traZODone (DESYREL) 50  MG tablet Take 100 mg by mouth at bedtime as needed for sleep.     . Vitamin D, Ergocalciferol, (DRISDOL) 50000 units CAPS capsule Take 50,000 Units by mouth every 7 (seven) days. On Mondays     No current facility-administered medications on file prior to visit.      Past Medical History:  Diagnosis Date  . Anxiety   . Bipolar disorder ([place])   . Chronic kidney disease    kidney infections  . Depression   . Fibromyalgia   . GERD (gastroesophageal reflux disease)   . History of cervical dysplasia   . History of exercise intolerance    normal ETT 10-24-2016  . History of kidney stones   . Hyperlipidemia   . Hypertension   .  Migraines   . Osteoarthritis   . PCOS (polycystic ovarian syndrome)   . Pneumonia 2018  . Rash    in area of eswl 04-23-2017  . Right ureteral calculus   . Sleep apnea   . Tachycardia cardiologist-  dr harding   controlled w/ metoprolol   Allergies  Allergen Reactions  . Aspirin Shortness Of Breath and Other (See Comments)    Angiodema  . Nsaids Anaphylaxis    Swelling of eyes mouth and throat difficulty breathing  . Naproxen     Social History   Socioeconomic History  . Marital status: Legally Separated    Spouse name: Not on file  . Number of children: 2  . Years of education: Not on file  . Highest education level: Not on file  Occupational History  . Occupation: Unemployed  Social Needs  . Financial resource strain: Not on file  . Food insecurity:    Worry: Not on file    Inability: Not on file  . Transportation needs:    Medical: Not on file    Non-medical: Not on file  Tobacco Use  . Smoking status: Current Some Day Smoker    Packs/day: 1.00    Years: 25.00    Pack years: 25.00    Types: Cigarettes  . Smokeless tobacco: Never Used  . Tobacco comment: 6-7 cig. daily  Substance and Sexual Activity  . Alcohol use: No  . Drug use: No    Types: Cocaine    Comment: last cocaine use 2014  . Sexual activity: Not Currently    Partners: Male    Birth control/protection: None  Lifestyle  . Physical activity:    Days per week: Not on file    Minutes per session: Not on file  . Stress: Not on file  Relationships  . Social connections:    Talks on phone: Not on file    Gets together: Not on file    Attends religious service: Not on file    Active member of club or organization: Not on file    Attends meetings of clubs or organizations: Not on file    Relationship status: Not on file  Other Topics Concern  . Not on file  Social History Narrative   She lives at home with her spouse. She does not exercise.   She currently denies recent recreational drug  use.    Vitals:   12/27/18 0901  BP: 124/82  Pulse: 84  Resp: 12  Temp: 98.4 F (36.9 C)  SpO2: 98%   Body mass index is 34.83 kg/m.   Physical Exam  Nursing note and vitals reviewed. Constitutional: She is oriented to person, place, and time. She appears well-developed. No distress.  HENT:  Head: Normocephalic and atraumatic.  Mouth/Throat: Oropharynx is clear and moist and mucous membranes are normal.  Eyes: Pupils are equal, round, and reactive to light. Conjunctivae are normal.  Cardiovascular: Normal rate and regular rhythm.  No murmur heard. Respiratory: Effort normal and breath sounds normal. No respiratory distress.  GI: Soft. She exhibits no mass. There is no hepatomegaly. There is no abdominal tenderness.  Musculoskeletal:        General: No edema.  Lymphadenopathy:    She has no cervical adenopathy.  Neurological: She is alert and oriented to person, place, and time. She has normal strength. No cranial nerve deficit. Gait normal.  Skin: Skin is warm. No rash noted. No erythema.  Erythematous indurated lesion on right buttock and left side of abdomen. Tender,no local heat, about 3 cm, with no fluctuant area. Scatted hyperpigmented macular lesions on buttocks,abdomen,and axillae.   Psychiatric: Her mood appears anxious. She does not exhibit a depressed mood. She expresses no suicidal ideation.  Fairly groomed, good eye contact.     ASSESSMENT AND PLAN:   [name] [name] was seen today for 3 months follow-up.  Diagnoses and all orders for this visit:  Fibromyalgia Still in pain but improved with Lyrica 300 mg twice daily.  No changes in current management. We discussed some side effects of medication. Low impact regular exercise and a good sleep hygiene recommended.  Forunculosis Dermatology evaluation recommended. She has followed with dermatologist in the past, she prefers to call and arrange her appointment. Bactrim DS bid x 7 days. Warm  compresses on active lesions.  -     sulfamethoxazole-trimethoprim (BACTRIM DS,SEPTRA DS) 800-160 MG tablet; Take 1 tablet by mouth 2 (two) times daily for 14 days.  Bipolar affective disorder, current episode depressed, current episode severity unspecified ([place]) Lithium recently started. She has tried several medications with no major benefits. So far she has tolerated the lithium well. She has an appointment for lab work and with psychiatrist next week.   Return in about 5 months (around 05/27/2019) for CPE and f/u.   [name]. Martinique, MD  [facility]. [place] office.

## 2018-12-27 NOTE — Patient Instructions (Addendum)
A few things to remember from today's visit:   Fibromyalgia  Forunculosis - Plan: sulfamethoxazole-trimethoprim (BACTRIM DS,SEPTRA DS) 800-160 MG tablet   Please be sure medication list is accurate. If a new problem present, please set up appointment sooner than planned today.

## 2018-12-28 ENCOUNTER — Other Ambulatory Visit: Payer: Medicare HMO

## 2018-12-30 DIAGNOSIS — R69 Illness, unspecified: Secondary | ICD-10-CM | POA: Diagnosis not present

## 2019-01-01 DIAGNOSIS — R69 Illness, unspecified: Secondary | ICD-10-CM | POA: Diagnosis not present

## 2019-01-01 DIAGNOSIS — M79671 Pain in right foot: Secondary | ICD-10-CM | POA: Diagnosis not present

## 2019-01-06 DIAGNOSIS — R69 Illness, unspecified: Secondary | ICD-10-CM | POA: Diagnosis not present

## 2019-01-07 ENCOUNTER — Telehealth: Payer: Self-pay | Admitting: *Deleted

## 2019-01-07 ENCOUNTER — Encounter: Payer: Self-pay | Admitting: Family Medicine

## 2019-01-07 ENCOUNTER — Ambulatory Visit (INDEPENDENT_AMBULATORY_CARE_PROVIDER_SITE_OTHER): Payer: Medicare HMO | Admitting: Family Medicine

## 2019-01-07 VITALS — BP 125/82 | HR 88 | Temp 98.7°F | Resp 12 | Ht 67.87 in | Wt 227.2 lb

## 2019-01-07 DIAGNOSIS — M545 Low back pain, unspecified: Secondary | ICD-10-CM | POA: Insufficient documentation

## 2019-01-07 DIAGNOSIS — R200 Anesthesia of skin: Secondary | ICD-10-CM

## 2019-01-07 DIAGNOSIS — G8929 Other chronic pain: Secondary | ICD-10-CM

## 2019-01-07 MED ORDER — TIZANIDINE HCL 4 MG PO TABS: 4.0000 mg | ORAL_TABLET | Freq: Three times a day (TID) | ORAL | 0 refills | Status: AC

## 2019-01-07 MED ORDER — METHYLPREDNISOLONE ACETATE 80 MG/ML IJ SUSP
80.0000 mg | Freq: Once | INTRAMUSCULAR | Status: AC
  Administered 2019-01-07: 80 mg via INTRAMUSCULAR

## 2019-01-07 MED ORDER — METHOCARBAMOL 500 MG PO TABS: 500.0000 mg | ORAL_TABLET | Freq: Four times a day (QID) | ORAL | 0 refills | Status: DC | PRN

## 2019-01-07 NOTE — Telephone Encounter (Signed)
Patient informed and verbalized understanding

## 2019-01-07 NOTE — Assessment & Plan Note (Signed)
Getting worse. She has not tolerated oral prednisone in the past, exacerbate psychotic symptoms. Here in the office after verbal consent, she received Depo-Medrol 80 mg IM.  She has tolerated this in the past. Instructed about warning signs. Lumbar MRI will be arranged. Referral to neurosurgeon placed.

## 2019-01-07 NOTE — Progress Notes (Signed)
ACUTE VISIT   HPI:  Chief Complaint  Patient presents with  . Back Pain    severe lower back pain that radiates down right leg, off and on for a while    [name] is a 48 y.o. female with Hx of chronic pain,polyarthralgias and fibromyalgia is here today complaining of severe lower back pain for the past 3-4 days.  She denies any recent injury or unusual level of activity.  Pain is radiated to buttock and lateral aspect of RLE, sharplike, 10/10 in intensity, with associated LE numbness,R>L. Denies urinary incontinence or retention, stool incontinence, or saddle anesthesia.  Lower extremity numbness, no associated weakness.She has had similar symptoms intermittent for a while. Exacerbated by any movement. Limitation of ROM. Alleviated by rest. No rash or edema on area, fever, chills, or abnormal wt loss.  Prior Hx of back pain: Yes, she has had lower back pain intermittently for years, usually resolves in a couple of days.  It is gradually getting worse.  States that in 2015 she had lumbar MRI that showed L4-5 bulging but it was not bad enough to have surgery.  She has had cervical spine surgery. She was following with Dr Ditty,neurosurgeon.   OTC medications: Tylenol. She is allergic to NSAIDs and aspirin.   Review of Systems  Constitutional: Positive for fatigue. Negative for chills and fever.  Gastrointestinal: Negative for abdominal pain and nausea.  Genitourinary: Negative for decreased urine volume, dysuria and frequency.  Musculoskeletal: Positive for arthralgias, back pain and myalgias. Negative for joint swelling.  Skin: Negative for color change and rash.  Neurological: Positive for numbness. Negative for weakness.  Psychiatric/Behavioral: Positive for sleep disturbance. Negative for confusion. The patient is nervous/anxious.       Current Outpatient Medications on File Prior to Visit  Medication Sig Dispense Refill  . acetaminophen  (TYLENOL) 325 MG tablet Take 2 tablets (650 mg total) by mouth every 4 (four) hours as needed for mild pain or moderate pain. 30 tablet 1  . benztropine (COGENTIN) 0.5 MG tablet Take 0.5 mg by mouth 2 (two) times daily.     Marland Kitchen buPROPion (WELLBUTRIN SR) 150 MG 12 hr tablet Take 150 mg by mouth 2 (two) times daily.    . carvedilol (COREG) 12.5 MG tablet Take 1 tablet (12.5 mg total) by mouth 2 (two) times daily with a meal. (Patient taking differently: Take 12.5-25 mg by mouth every 12 (twelve) hours. ) 180 tablet 1  . hydrOXYzine (ATARAX/VISTARIL) 10 MG tablet Take 10 mg by mouth 3 (three) times daily as needed.    . lamoTRIgine (LAMICTAL) 100 MG tablet Take 100 mg by mouth 2 (two) times daily.    Marland Kitchen lithium carbonate 300 MG capsule     . lurasidone (LATUDA) 20 MG TABS tablet Take 20 mg by mouth every evening.    . Omega-3 Fatty Acids (FISH OIL) 1000 MG CAPS Take by mouth 3 (three) times daily.     Marland Kitchen omeprazole (PRILOSEC) 40 MG capsule Take 1 capsule (40 mg total) by mouth daily. 90 capsule 3  . pregabalin (LYRICA) 300 MG capsule Take 1 capsule (300 mg total) by mouth 2 (two) times daily. 60 capsule 2  . Prenatal Vit-Fe Fumarate-FA (PRENATAL VITAMIN PO) Take 1 tablet by mouth at bedtime.    . sulfamethoxazole-trimethoprim (BACTRIM DS,SEPTRA DS) 800-160 MG tablet Take 1 tablet by mouth 2 (two) times daily for 14 days. 28 tablet 0  . terbinafine (LAMISIL) 250 MG tablet  Take 1 tablet (250 mg total) by mouth daily. 90 tablet 0  . traZODone (DESYREL) 50 MG tablet Take 100 mg by mouth at bedtime as needed for sleep.     . Vitamin D, Ergocalciferol, (DRISDOL) 50000 units CAPS capsule Take 50,000 Units by mouth every 7 (seven) days. On Mondays     No current facility-administered medications on file prior to visit.      Past Medical History:  Diagnosis Date  . Anxiety   . Bipolar disorder ([place])   . Chronic kidney disease    kidney infections  . Depression   . Fibromyalgia   . GERD (gastroesophageal  reflux disease)   . History of cervical dysplasia   . History of exercise intolerance    normal ETT 10-24-2016  . History of kidney stones   . Hyperlipidemia   . Hypertension   . Migraines   . Osteoarthritis   . PCOS (polycystic ovarian syndrome)   . Pneumonia 2018  . Rash    in area of eswl 04-23-2017  . Right ureteral calculus   . Sleep apnea   . Tachycardia cardiologist-  dr harding   controlled w/ metoprolol   Allergies  Allergen Reactions  . Aspirin Shortness Of Breath and Other (See Comments)    Angiodema  . Nsaids Anaphylaxis    Swelling of eyes mouth and throat difficulty breathing  . Naproxen     Social History   Socioeconomic History  . Marital status: Legally Separated    Spouse name: Not on file  . Number of children: 2  . Years of education: Not on file  . Highest education level: Not on file  Occupational History  . Occupation: Unemployed  Social Needs  . Financial resource strain: Not on file  . Food insecurity:    Worry: Not on file    Inability: Not on file  . Transportation needs:    Medical: Not on file    Non-medical: Not on file  Tobacco Use  . Smoking status: Current Some Day Smoker    Packs/day: 1.00    Years: 25.00    Pack years: 25.00    Types: Cigarettes  . Smokeless tobacco: Never Used  . Tobacco comment: 6-7 cig. daily  Substance and Sexual Activity  . Alcohol use: No  . Drug use: No    Types: Cocaine    Comment: last cocaine use 2014  . Sexual activity: Not Currently    Partners: Male    Birth control/protection: None  Lifestyle  . Physical activity:    Days per week: Not on file    Minutes per session: Not on file  . Stress: Not on file  Relationships  . Social connections:    Talks on phone: Not on file    Gets together: Not on file    Attends religious service: Not on file    Active member of club or organization: Not on file    Attends meetings of clubs or organizations: Not on file    Relationship status: Not  on file  Other Topics Concern  . Not on file  Social History Narrative   She lives at home with her spouse. She does not exercise.   She currently denies recent recreational drug use.    Vitals:   01/07/19 1509  BP: 125/82  Pulse: 88  Resp: 12  Temp: 98.7 F (37.1 C)  SpO2: 98%   Body mass index is 34.69 kg/m.   Physical Exam  Nursing note  and vitals reviewed. Constitutional: She is oriented to person, place, and time. She appears well-developed. She does not appear ill. She appears distressed.  HENT:  Head: Normocephalic and atraumatic.  Eyes: Pupils are equal, round, and reactive to light. Conjunctivae and EOM are normal.  Respiratory: Effort normal and breath sounds normal. No respiratory distress.  GI: Soft. She exhibits no mass. There is no abdominal tenderness.  Musculoskeletal:        General: No edema.     Lumbar back: She exhibits decreased range of motion and tenderness. She exhibits no bony tenderness.       Back:     Comments: No significant deformity appreciated. There is tenderness upon palpation of right lumbar paraspinal muscles. Pain elicited with movement on exam table during examination. No local edema or erythema appreciated, no suspicious lesions.    Neurological: She is alert and oriented to person, place, and time. She has normal strength.  Reflex Scores:      Patellar reflexes are 2+ on the right side and 2+ on the left side.      Achilles reflexes are 2+ on the right side and 2+ on the left side. SLR positive bilateral, R>L Antalgic gait.  Skin: Skin is warm. No rash noted. No erythema.  Psychiatric: Her mood appears anxious.  Fairly groomed, good eye contact.    ASSESSMENT AND PLAN:  [name] was seen today for back pain.  Orders Placed This Encounter  Procedures  . MR Lumbar Spine Wo Contrast  . Ambulatory referral to Neurosurgery     Lower extremity numbness Instructed about warning signs. Depo Medrol 80 mg IM x 1. Lumbar  MRI will be arranged.  -     MR Lumbar Spine Wo Contrast; Future -     Ambulatory referral to Neurosurgery -     methylPREDNISolone acetate (DEPO-MEDROL) injection 80 mg   Chronic right-sided low back pain Getting worse. She has not tolerated oral prednisone in the past, exacerbate psychotic symptoms. Here in the office after verbal consent, she received Depo-Medrol 80 mg IM.  She has tolerated this in the past. Instructed about warning signs. Lumbar MRI will be arranged. Referral to neurosurgeon placed.    Return if symptoms worsen or fail to improve.   -[name] was advised to seek immediate medical attention if sudden worsening symptoms.      G. Martinique, MD  [facility]. [place] office.

## 2019-01-07 NOTE — Telephone Encounter (Signed)
Prescription for Zanaflex 4 mg was sent to her pharmacy. Thanks, BJ

## 2019-01-07 NOTE — Patient Instructions (Addendum)
A few things to remember from today's visit:   Chronic right-sided low back pain, unspecified whether sciatica present - Plan: methocarbamol (ROBAXIN) 500 MG tablet, MR Lumbar Spine Wo Contrast, Ambulatory referral to Neurosurgery  Lower extremity numbness - Plan: MR Lumbar Spine Wo Contrast, Ambulatory referral to Neurosurgery You can try topical Aspercreme or icy hot with lidocaine. Methocarbamol causes drowsiness. Continue acetaminophen 500 mg up to 4 times per day.   Please be sure medication list is accurate. If a new problem present, please set up appointment sooner than planned today.

## 2019-01-07 NOTE — Telephone Encounter (Signed)
Message sent to Dr. Martinique for review.  Copied from [place] [phone]. Topic: General - Other >> Jan 07, 2019  4:09 PM Yvette Rack wrote: Reason for CRM: Pt stated she is at the pharmacy to pick up Rx for methocarbamol (ROBAXIN) 500 MG tablet but it is not covered by insurance. Pt requests that another medication be sent to her pharmacy.

## 2019-01-10 ENCOUNTER — Telehealth: Payer: Self-pay | Admitting: Family Medicine

## 2019-01-10 NOTE — Telephone Encounter (Signed)
Message sent to Dr. Jordan for review and approval. 

## 2019-01-10 NOTE — Telephone Encounter (Signed)
Copied from [place] [phone]. Topic: Quick Communication - See Telephone Encounter >> Jan 10, 2019 11:11 AM Vernona Rieger wrote: CRM for notification. See Telephone encounter for: 01/10/19.  Patient states that she has not been able to poop in a week and usually goes three times a day. She said she is very impacted and thinks its coming from her back pain and really hopes that Dr Martinique can get her MRI approved with her insurance. Please Advise.

## 2019-01-10 NOTE — Telephone Encounter (Signed)
We are still working on lumbar MRI, radiology will contact her with appt information. Also pending appointment with neurosurgeon. She can take OTC MiraLAX and bisacodyl daily as needed. Adequate hydration and fiber intake.  Thanks, BJ

## 2019-01-10 NOTE — Telephone Encounter (Signed)
Patient given recommendations per Dr. Jordan and verbalized understanding. 

## 2019-01-13 ENCOUNTER — Ambulatory Visit: Payer: Medicare HMO | Admitting: Podiatry

## 2019-01-13 DIAGNOSIS — L600 Ingrowing nail: Secondary | ICD-10-CM | POA: Diagnosis not present

## 2019-01-13 DIAGNOSIS — B351 Tinea unguium: Secondary | ICD-10-CM | POA: Diagnosis not present

## 2019-01-13 DIAGNOSIS — R69 Illness, unspecified: Secondary | ICD-10-CM | POA: Diagnosis not present

## 2019-01-14 ENCOUNTER — Telehealth: Payer: Self-pay | Admitting: Family Medicine

## 2019-01-14 NOTE — Telephone Encounter (Signed)
Copied from [place] [id number]. Topic: Quick Communication - See Telephone Encounter >> Jan 14, 2019  5:00 PM Selinda Flavin B, [place] wrote: CRM for notification. See Telephone encounter for: 01/14/19. [name] [name] with [employer] with [employer] calling and states that the case # [id number]- Dr requested MRI w/o contract cpt code [phone]. States that they can not approve the request at this time. It is under pending denial. States that they are needing more clinical information. There are 2 options to get this denial reversed. 1) Peer to peer calling, which would need to be completed by 01/18/2019. To schedule this, call [phone] option 4 and give case # ([id number]). 2) Speak with the clinical dept. Can be reached at [phone] option 3 and give case # ([id number])

## 2019-01-15 ENCOUNTER — Inpatient Hospital Stay: Admission: RE | Admit: 2019-01-15 | Payer: Self-pay | Source: Ambulatory Visit

## 2019-01-15 ENCOUNTER — Ambulatory Visit (INDEPENDENT_AMBULATORY_CARE_PROVIDER_SITE_OTHER): Payer: Medicare HMO

## 2019-01-15 ENCOUNTER — Encounter: Payer: Self-pay | Admitting: Family Medicine

## 2019-01-15 ENCOUNTER — Ambulatory Visit (INDEPENDENT_AMBULATORY_CARE_PROVIDER_SITE_OTHER): Payer: Medicare HMO | Admitting: Family Medicine

## 2019-01-15 VITALS — BP 122/76 | HR 93 | Temp 98.8°F | Resp 12 | Ht 67.87 in | Wt 229.1 lb

## 2019-01-15 DIAGNOSIS — M5441 Lumbago with sciatica, right side: Secondary | ICD-10-CM

## 2019-01-15 DIAGNOSIS — K5900 Constipation, unspecified: Secondary | ICD-10-CM

## 2019-01-15 DIAGNOSIS — R69 Illness, unspecified: Secondary | ICD-10-CM | POA: Diagnosis not present

## 2019-01-15 DIAGNOSIS — G8929 Other chronic pain: Secondary | ICD-10-CM | POA: Diagnosis not present

## 2019-01-15 DIAGNOSIS — M5442 Lumbago with sciatica, left side: Secondary | ICD-10-CM

## 2019-01-15 DIAGNOSIS — M545 Low back pain: Secondary | ICD-10-CM | POA: Diagnosis not present

## 2019-01-15 LAB — BASIC METABOLIC PANEL
BUN: 16 mg/dL (ref 6–23)
CALCIUM: 9.5 mg/dL (ref 8.4–10.5)
CO2: 27 meq/L (ref 19–32)
CREATININE: 0.82 mg/dL (ref 0.40–1.20)
Chloride: 103 mEq/L (ref 96–112)
GFR: 75.28 mL/min (ref >60.00)
Glucose, Bld: 109 mg/dL — ABNORMAL HIGH (ref 70–99)
Potassium: 4.4 mEq/L (ref 3.5–5.1)
Sodium: 139 mEq/L (ref 135–145)

## 2019-01-15 MED ORDER — HYDROCODONE-ACETAMINOPHEN 5-325 MG PO TABS: 1.0000 | ORAL_TABLET | Freq: Two times a day (BID) | ORAL | 0 refills | Status: AC | PRN

## 2019-01-15 MED ORDER — LINACLOTIDE 145 MCG PO CAPS: 145.0000 ug | ORAL_CAPSULE | Freq: Every day | ORAL | 1 refills | Status: DC

## 2019-01-15 NOTE — Progress Notes (Signed)
   Subjective: Patient presents post ingrown nail permanent nail avulsion procedure of the lateral border of the right hallux that was performed on 12/18/2018. She is also here for follow up of a fungal right great toenail. She states the wound from the procedure is healing appropriately. She denies any significant pain or modifying factors. She states she is taking the Lamisil for the fungal nail as directed. Patient states that the toe and nail fold is feeling much better.  Past Medical History:  Diagnosis Date  . Anxiety   . Bipolar disorder ([place])   . Chronic kidney disease    kidney infections  . Depression   . Fibromyalgia   . GERD (gastroesophageal reflux disease)   . History of cervical dysplasia   . History of exercise intolerance    normal ETT 10-24-2016  . History of kidney stones   . Hyperlipidemia   . Hypertension   . Migraines   . Osteoarthritis   . PCOS (polycystic ovarian syndrome)   . Pneumonia 2018  . Rash    in area of eswl 04-23-2017  . Right ureteral calculus   . Sleep apnea   . Tachycardia cardiologist-  dr harding   controlled w/ metoprolol    Objective: Skin is warm, dry and supple. Nail and respective nail fold appears to be healing appropriately. Open wound to the associated nail fold with a granular wound base and moderate amount of fibrotic tissue. Minimal drainage noted. Mild erythema around the periungual region likely due to phenol chemical matricectomy. Hyperkeratotic, discolored, thickened, onychodystrophy of the right great toenail.   Assessment: #1 postop permanent partial nail avulsion lateral border right hallux  #2 open wound periungual nail fold of respective digit.  #3 Onychomycosis right great toenail  Plan of care: #1 patient was evaluated  #2 debridement of open wound was performed to the periungual border of the respective toe using a currette. Antibiotic ointment and Band-Aid was applied. #3 Continue taking oral Lamisil 250 mg as  prescribed.  #4 Continue using topical antifungal lacquer.  #5 patient is to return to clinic on a PRN basis.   Edrick Kins, DPM [facility]  Dr. Edrick Kins, [address]                                        [place], [place] [address]                Office [phone]  Fax [phone]

## 2019-01-15 NOTE — Patient Instructions (Signed)
A few things to remember from today's visit:   Chronic right-sided low back pain with bilateral sciatica - Plan: DG Lumbar Spine Complete, Ambulatory referral to Orthopedic Surgery  Constipation, unspecified constipation type - Plan: Basic metabolic panel   Please be sure medication list is accurate. If a new problem present, please set up appointment sooner than planned today.

## 2019-01-15 NOTE — Telephone Encounter (Signed)
Patient came into the office on 01/15/2019 and discussed further options with PCP. Nothing further needed at this time.

## 2019-01-15 NOTE — Telephone Encounter (Signed)
Message sent to Dr. Jordan for review. 

## 2019-01-15 NOTE — Progress Notes (Signed)
ACUTE VISIT   HPI:  Chief Complaint  Patient presents with  . Back Pain    back pain getting worse since last week    [name] [name] is a 48 y.o. female, who is here today complaining of worsening lower back pain,now on coccygeal area,radiated to both LE's. Having new burning like pain on buttocks. Pain is sharp,constant, severe. Exacerbated by moving after prolonged sitting. It is interfering with sleep.  Denies saddle anesthesia but has had or urine/bowel incontinence. No LE numbness or tingling.  She cannot take NSAID's because GI side effects. Tylenol has not helped.  Denies fever,chills,or skin changes. + Limitation of movement.   Hx of fibromyalgia and back pain. States that pain hans not been this bad in long time.  Referral to neurosurgeon was placed last OV but appt has not been arranged. Lumbar MRI was denied by her insurance.   Constipation: She has tried Miralax and Bisacodyl but they are not helping. Problem is getting worse. She has not had a bowel movement in 4-5 days, usually stools are small and hard. Denies abdominal pain, nausea, vomiting, blood in stool or melena.   Review of Systems  Constitutional: Positive for fatigue.  Respiratory: Negative for cough, shortness of breath and wheezing.   Cardiovascular: Negative for leg swelling.  Gastrointestinal: Positive for constipation. Negative for abdominal pain, anal bleeding, nausea and vomiting.  Genitourinary: Negative for decreased urine volume, dysuria and hematuria.  Musculoskeletal: Positive for arthralgias, back pain and myalgias. Negative for gait problem.  Skin: Negative for rash.  Neurological: Negative for weakness and headaches.  Psychiatric/Behavioral: Positive for sleep disturbance. Negative for confusion. The patient is nervous/anxious.     Current Outpatient Medications on File Prior to Visit  Medication Sig Dispense Refill  . acetaminophen (TYLENOL) 325 MG tablet  Take 2 tablets (650 mg total) by mouth every 4 (four) hours as needed for mild pain or moderate pain. 30 tablet 1  . benztropine (COGENTIN) 0.5 MG tablet Take 0.5 mg by mouth 2 (two) times daily.     Marland Kitchen buPROPion (WELLBUTRIN SR) 150 MG 12 hr tablet Take 150 mg by mouth 2 (two) times daily.    . carvedilol (COREG) 12.5 MG tablet Take 1 tablet (12.5 mg total) by mouth 2 (two) times daily with a meal. (Patient taking differently: Take 12.5-25 mg by mouth every 12 (twelve) hours. ) 180 tablet 1  . hydrOXYzine (ATARAX/VISTARIL) 10 MG tablet Take 10 mg by mouth 3 (three) times daily as needed.    . lamoTRIgine (LAMICTAL) 100 MG tablet Take 100 mg by mouth 2 (two) times daily.    Marland Kitchen lithium carbonate 300 MG capsule     . lurasidone (LATUDA) 20 MG TABS tablet Take 20 mg by mouth every evening.    . Omega-3 Fatty Acids (FISH OIL) 1000 MG CAPS Take by mouth 3 (three) times daily.     Marland Kitchen omeprazole (PRILOSEC) 40 MG capsule Take 1 capsule (40 mg total) by mouth daily. 90 capsule 3  . pregabalin (LYRICA) 300 MG capsule Take 1 capsule (300 mg total) by mouth 2 (two) times daily. 60 capsule 2  . Prenatal Vit-Fe Fumarate-FA (PRENATAL VITAMIN PO) Take 1 tablet by mouth at bedtime.    . terbinafine (LAMISIL) 250 MG tablet Take 1 tablet (250 mg total) by mouth daily. 90 tablet 0  . tiZANidine (ZANAFLEX) 4 MG tablet Take 1 tablet (4 mg total) by mouth 3 (three) times daily for 20 days. [place]  tablet 0  . traZODone (DESYREL) 50 MG tablet Take 100 mg by mouth at bedtime as needed for sleep.     . Vitamin D, Ergocalciferol, (DRISDOL) 50000 units CAPS capsule Take 50,000 Units by mouth every 7 (seven) days. On Mondays     No current facility-administered medications on file prior to visit.      Past Medical History:  Diagnosis Date  . Anxiety   . Bipolar disorder ([place])   . Chronic kidney disease    kidney infections  . Depression   . Fibromyalgia   . GERD (gastroesophageal reflux disease)   . History of cervical  dysplasia   . History of exercise intolerance    normal ETT 10-24-2016  . History of kidney stones   . Hyperlipidemia   . Hypertension   . Migraines   . Osteoarthritis   . PCOS (polycystic ovarian syndrome)   . Pneumonia 2018  . Rash    in area of eswl 04-23-2017  . Right ureteral calculus   . Sleep apnea   . Tachycardia cardiologist-  dr harding   controlled w/ metoprolol   Allergies  Allergen Reactions  . Aspirin Shortness Of Breath and Other (See Comments)    Angiodema  . Nsaids Anaphylaxis    Swelling of eyes mouth and throat difficulty breathing  . Naproxen     Social History   Socioeconomic History  . Marital status: Legally Separated    Spouse name: Not on file  . Number of children: 2  . Years of education: Not on file  . Highest education level: Not on file  Occupational History  . Occupation: Unemployed  Social Needs  . Financial resource strain: Not on file  . Food insecurity:    Worry: Not on file    Inability: Not on file  . Transportation needs:    Medical: Not on file    Non-medical: Not on file  Tobacco Use  . Smoking status: Current Some Day Smoker    Packs/day: 1.00    Years: 25.00    Pack years: 25.00    Types: Cigarettes  . Smokeless tobacco: Never Used  . Tobacco comment: 6-7 cig. daily  Substance and Sexual Activity  . Alcohol use: No  . Drug use: No    Types: Cocaine    Comment: last cocaine use 2014  . Sexual activity: Not Currently    Partners: Male    Birth control/protection: None  Lifestyle  . Physical activity:    Days per week: Not on file    Minutes per session: Not on file  . Stress: Not on file  Relationships  . Social connections:    Talks on phone: Not on file    Gets together: Not on file    Attends religious service: Not on file    Active member of club or organization: Not on file    Attends meetings of clubs or organizations: Not on file    Relationship status: Not on file  Other Topics Concern  . Not on  file  Social History Narrative   She lives at home with her spouse. She does not exercise.   She currently denies recent recreational drug use.    Vitals:   01/15/19 0846  BP: 122/76  Pulse: 93  Resp: 12  Temp: 98.8 F (37.1 C)  SpO2: 98%   Body mass index is 34.97 kg/m.   Physical Exam  Nursing note and vitals reviewed. Constitutional: She is oriented to person, place,  and time. She appears well-developed. She does not appear ill. No distress.  HENT:  Head: Normocephalic and atraumatic.  Eyes: Pupils are equal, round, and reactive to light. Conjunctivae and EOM are normal.  Respiratory: Effort normal and breath sounds normal. No respiratory distress.  GI: Soft. She exhibits no mass. There is no hepatomegaly. There is no abdominal tenderness.  Musculoskeletal:        General: No edema.     Lumbar back: She exhibits tenderness. She exhibits no bony tenderness.       Back:     Comments: No significant deformity appreciated. There is tenderness upon palpation of lower  paraspinal muscles. Pain elicited with movement on exam table during examination. No local edema or erythema appreciated, no suspicious lesions.    Neurological: She is alert and oriented to person, place, and time. She has normal strength.  Reflex Scores:      Patellar reflexes are 2+ on the right side and 2+ on the left side.      Achilles reflexes are 2+ on the right side and 2+ on the left side. SLR elecits bilateral. Antalgic gait.  Skin: Skin is warm. No rash noted. No erythema.  Psychiatric: Her mood appears anxious.  Well groomed, good eye contact.    ASSESSMENT AND PLAN:  [name] was seen today for back pain.  Diagnoses and all orders for this visit:  Chronic right-sided low back pain with bilateral sciatica Getting worse. After discussing some side effects of opioid meds,including addiction,she agrees with Hydrocodone-Acetaminophen 5-325 mg bid prn. Instructed about warning  signs. Ortho referral placed.  -     DG Lumbar Spine Complete; Future -     HYDROcodone-acetaminophen (NORCO/VICODIN) 5-325 MG tablet; Take 1 tablet by mouth every 12 (twelve) hours as needed for up to 7 days for moderate pain. -     Ambulatory referral to Orthopedic Surgery  Constipation, unspecified constipation type Increase fluid and fiber intake. Opioid med could aggravate problem. Linzess side effects discussed. Further recommendations according to BMP result.  -     Basic metabolic panel -     linaclotide (LINZESS) 145 MCG CAPS capsule; Take 1 capsule (145 mcg total) by mouth daily before breakfast.     Return if symptoms worsen or fail to improve.     Davaris Youtsey G. Martinique, MD  [facility]. [place] office.

## 2019-01-17 ENCOUNTER — Encounter: Payer: Self-pay | Admitting: Family Medicine

## 2019-01-19 ENCOUNTER — Encounter: Payer: Self-pay | Admitting: Family Medicine

## 2019-01-20 NOTE — Telephone Encounter (Signed)
FYI:  [name] [name] with [employer] a part of [employer] calling and states that the patient's MRI has been officially denied. Any questions, call [phone] option [phone].  Case #: [id number]

## 2019-01-21 NOTE — Telephone Encounter (Signed)
FYI sent to Dr. Jordan for review. 

## 2019-01-22 DIAGNOSIS — R102 Pelvic and perineal pain: Secondary | ICD-10-CM | POA: Diagnosis not present

## 2019-01-23 DIAGNOSIS — M5126 Other intervertebral disc displacement, lumbar region: Secondary | ICD-10-CM | POA: Diagnosis not present

## 2019-01-23 DIAGNOSIS — M5416 Radiculopathy, lumbar region: Secondary | ICD-10-CM | POA: Diagnosis not present

## 2019-02-12 ENCOUNTER — Other Ambulatory Visit: Payer: Self-pay

## 2019-02-12 DIAGNOSIS — R69 Illness, unspecified: Secondary | ICD-10-CM | POA: Diagnosis not present

## 2019-02-13 DIAGNOSIS — R69 Illness, unspecified: Secondary | ICD-10-CM | POA: Diagnosis not present

## 2019-02-17 ENCOUNTER — Other Ambulatory Visit: Payer: Self-pay | Admitting: Family Medicine

## 2019-02-17 DIAGNOSIS — L0292 Furuncle, unspecified: Secondary | ICD-10-CM

## 2019-02-18 DIAGNOSIS — M545 Low back pain: Secondary | ICD-10-CM | POA: Diagnosis not present

## 2019-02-19 DIAGNOSIS — R69 Illness, unspecified: Secondary | ICD-10-CM | POA: Diagnosis not present

## 2019-02-20 ENCOUNTER — Other Ambulatory Visit: Payer: Self-pay

## 2019-02-20 ENCOUNTER — Ambulatory Visit
Admission: RE | Admit: 2019-02-20 | Discharge: 2019-02-20 | Disposition: A | Discharge status: Home / Self Care | Payer: Medicare HMO | Source: Ambulatory Visit | Attending: Family Medicine | Admitting: Family Medicine

## 2019-02-20 ENCOUNTER — Ambulatory Visit: Payer: Self-pay

## 2019-02-20 DIAGNOSIS — N6001 Solitary cyst of right breast: Secondary | ICD-10-CM

## 2019-02-20 DIAGNOSIS — G4733 Obstructive sleep apnea (adult) (pediatric): Secondary | ICD-10-CM | POA: Diagnosis not present

## 2019-02-24 DIAGNOSIS — M545 Low back pain: Secondary | ICD-10-CM | POA: Diagnosis not present

## 2019-02-26 DIAGNOSIS — R69 Illness, unspecified: Secondary | ICD-10-CM | POA: Diagnosis not present

## 2019-03-05 ENCOUNTER — Other Ambulatory Visit: Payer: Self-pay | Admitting: Family Medicine

## 2019-03-05 DIAGNOSIS — L0292 Furuncle, unspecified: Secondary | ICD-10-CM

## 2019-03-05 NOTE — Telephone Encounter (Signed)
Is this okay to refill?  Patient reports she needs this for her recurrent boils.

## 2019-03-13 DIAGNOSIS — R69 Illness, unspecified: Secondary | ICD-10-CM | POA: Diagnosis not present

## 2019-03-17 ENCOUNTER — Other Ambulatory Visit: Payer: Self-pay

## 2019-03-17 ENCOUNTER — Ambulatory Visit (INDEPENDENT_AMBULATORY_CARE_PROVIDER_SITE_OTHER): Payer: Medicare HMO | Admitting: Family Medicine

## 2019-03-17 ENCOUNTER — Telehealth: Payer: Self-pay | Admitting: Family Medicine

## 2019-03-17 VITALS — Resp 12

## 2019-03-17 DIAGNOSIS — L0292 Furuncle, unspecified: Secondary | ICD-10-CM | POA: Diagnosis not present

## 2019-03-17 DIAGNOSIS — L02215 Cutaneous abscess of perineum: Secondary | ICD-10-CM | POA: Diagnosis not present

## 2019-03-17 MED ORDER — DOXYCYCLINE HYCLATE 100 MG PO TABS: 100.0000 mg | ORAL_TABLET | Freq: Two times a day (BID) | ORAL | 0 refills | Status: AC

## 2019-03-17 NOTE — Telephone Encounter (Signed)
Patient called and is concerned on why her Bactrim medication has not been sent in. She states it has been over a week since she was told that it would be. Please advise.

## 2019-03-17 NOTE — Telephone Encounter (Signed)
Spoke with patient and informed her that she needed an appointment. Patient verbalized understanding and scheduled virtual visit for today, 03/17/2019.

## 2019-03-17 NOTE — Progress Notes (Addendum)
Virtual Visit via Video Note   I connected with [name] [name] on 03/17/19 at 11:15 AM EDT by a video enabled telemedicine application and verified that I am speaking with the correct person using two identifiers.  Location patient: home Location provider:home office Persons participating in the virtual visit: patient, provider  I discussed the limitations of evaluation and management by telemedicine and the availability of in person appointments. She expressed understanding and agreed to proceed.   HPI: [name] [name] is a 48 yo female with Hx of depression,anxiety,and fibromyalgia among some. C/O recurrent abscess, currently she has a tender lesions on perineal area,improved with topical Proactive med she uses for acne. Last time we addressed this problem, 12/27/2018, I recommended arranging appointment with dermatologist, she did not think she needed a referral, so she was instructed to call and arrange appointment. Last time she was treated with Bactrim DS twice daily x7 days, which really helped. She would like to take medication again.  A few months ago I recommended doxycycline 100 mg twice daily for 30 days and then 100 mg daily.  She states that initially antibiotic was helping but when dose was decreased to 100 mg daily she started having skin lesions again. She denies associated fever, chills, worsening body aches, vaginal discharge, vaginal bleeding, or urinary symptoms.  She is not sure about exacerbating factors, she wonders if problem is aggravated by a stress.  This is a problem she has had for several years, she has addressed it with dermatologist.  ROS: See pertinent positives and negatives per HPI. COVID-19 screening questions: Denies new fever,cough,sore throat,or possible exposure to COVID-19. Negative for loss in the sense of smell or taste.   Past Medical History:  Diagnosis Date  . Anxiety   . Bipolar disorder ([place])   . Chronic kidney disease    kidney infections  .  Depression   . Fibromyalgia   . GERD (gastroesophageal reflux disease)   . History of cervical dysplasia   . History of exercise intolerance    normal ETT 10-24-2016  . History of kidney stones   . Hyperlipidemia   . Hypertension   . Migraines   . Osteoarthritis   . PCOS (polycystic ovarian syndrome)   . Pneumonia 2018  . Rash    in area of eswl 04-23-2017  . Right ureteral calculus   . Sleep apnea   . Tachycardia cardiologist-  dr harding   controlled w/ metoprolol    Past Surgical History:  Procedure Laterality Date  . ANTERIOR CERVICAL DECOMP/DISCECTOMY FUSION N/A 09/01/2015   Procedure: ANTERIOR CERVICAL DECOMPRESSION/DISCECTOMY FUSION PLATING BONEGRAFT CERVICAL FIVE-SIX;  Surgeon: Kevan Ny Ditty, MD;  Location: [facility];  Service: Neurosurgery;  Laterality: N/A;  . BREAST EXCISIONAL BIOPSY Bilateral left 10/15/2002;   right 1997   left ductal system excision (papilloma w/ florid epithelial hyperplasia)/  right benign lumpectomy  . CARDIOVASCULAR STRESS TEST  04/27/2009   normal nuclear study w/ no ischemia/  normal LV function and wall motion , ef 66%  . CHOLECYSTECTOMY N/A 03/13/2013   Procedure: LAPAROSCOPIC CHOLECYSTECTOMY;  Surgeon: Harl Bowie, MD;  Location: [facility];  Service: General;  Laterality: N/A;  . COLONOSCOPY  last one 11-08-2006  . CYSTOSCOPY N/A 05/08/2018   Procedure: CYSTOSCOPY;  Surgeon: Paula Compton, MD;  Location: [facility];  Service: Gynecology;  Laterality: N/A;  . CYSTOSCOPY WITH RETROGRADE PYELOGRAM, URETEROSCOPY AND STENT PLACEMENT Right 05/18/2017   Procedure: CYSTOSCOPY WITH RETROGRADE PYELOGRAM, URETEROSCOPY AND STENT PLACEMENT;  Surgeon: Alexis Frock, MD;  Location: [facility];  Service: Urology;  Laterality: Right;  . DX LAPAROSCOPY W/ LYSIS ADHESIONS/  LASER VAPORIZATION OF CERVIX  06/25/2002  . ESOPHAGOGASTRODUODENOSCOPY  04/25/2005  . EXPLORATORY LEFT THUMB REPAIR TENDON/ LACERATION   09/27/1999  . EXTRACORPOREAL SHOCK WAVE LITHOTRIPSY Right 04/23/2017   Procedure: RIGHT EXTRACORPOREAL SHOCK WAVE LITHOTRIPSY (ESWL);  Surgeon: Alexis Frock, MD;  Location: [facility];  Service: Urology;  Laterality: Right;  . EXTRACORPOREAL SHOCK WAVE LITHOTRIPSY Right 04/23/2017  . HOLMIUM LASER APPLICATION Right 0/25/4270   Procedure: HOLMIUM LASER APPLICATION;  Surgeon: Alexis Frock, MD;  Location: [facility];  Service: Urology;  Laterality: Right;  . LAPAROSCOPIC VAGINAL HYSTERECTOMY WITH SALPINGECTOMY Bilateral 05/08/2018   Procedure: LAPAROSCOPIC ASSISTED VAGINAL HYSTERECTOMY WITH SALPINGECTOMY,  I & D Sebaceous Left Labia;  Surgeon: Paula Compton, MD;  Location: [facility];  Service: Gynecology;  Laterality: Bilateral;  . LAPAROSCOPY  07/30/2012   Procedure: LAPAROSCOPY DIAGNOSTIC;  Surgeon: Logan Bores, MD;  Location: [facility];  Service: Gynecology;  Laterality: N/A;  . WISDOM TOOTH EXTRACTION      Family History  Problem Relation Age of Onset  . Diabetes Father   . Hypertension Father   . Arthritis Father   . Depression Father   . Hearing loss Father   . Diabetes Mother   . Hypertension Mother   . Arthritis Mother   . Depression Mother   . Asthma Paternal Grandmother   . Kidney disease Paternal Grandmother   . Arthritis Paternal Grandmother   . Cancer Paternal Grandmother   . COPD Paternal Grandmother   . Hearing loss Paternal Grandmother   . Heart disease Paternal Grandmother   . Hypertension Paternal Grandmother   . Hyperlipidemia Paternal Grandmother   . Stroke Paternal Grandmother   . Heart attack Paternal Grandmother   . Depression Sister   . Emphysema Other   . Tongue cancer Other   . Coronary artery disease Other   . COPD Son   . Drug abuse Son   . Arthritis Maternal Grandmother   . COPD Maternal Grandmother   . Depression Maternal Grandmother   . Heart disease Maternal Grandmother   . Early death Maternal Grandfather   . Heart attack  Maternal Grandfather   . COPD Paternal Grandfather   . Heart attack Paternal Grandfather   . Breast cancer Neg Hx     Social History   Socioeconomic History  . Marital status: Legally Separated    Spouse name: Not on file  . Number of children: 2  . Years of education: Not on file  . Highest education level: Not on file  Occupational History  . Occupation: Unemployed  Social Needs  . Financial resource strain: Not on file  . Food insecurity:    Worry: Not on file    Inability: Not on file  . Transportation needs:    Medical: Not on file    Non-medical: Not on file  Tobacco Use  . Smoking status: Current Some Day Smoker    Packs/day: 1.00    Years: 25.00    Pack years: 25.00    Types: Cigarettes  . Smokeless tobacco: Never Used  . Tobacco comment: 6-7 cig. daily  Substance and Sexual Activity  . Alcohol use: No  . Drug use: No    Types: Cocaine    Comment: last cocaine use 2014  . Sexual activity: Not Currently    Partners: Male    Birth control/protection: None  Lifestyle  . Physical activity:    Days per week: Not on file    Minutes per session: Not on file  . Stress: Not on file  Relationships  . Social connections:    Talks on phone: Not on file    Gets together: Not on file    Attends religious service: Not on file    Active member of club or organization: Not on file    Attends meetings of clubs or organizations: Not on file    Relationship status: Not on file  . Intimate partner violence:    Fear of current or ex partner: Not on file    Emotionally abused: Not on file    Physically abused: Not on file    Forced sexual activity: Not on file  Other Topics Concern  . Not on file  Social History Narrative   She lives at home with her spouse. She does not exercise.   She currently denies recent recreational drug use.     Current Outpatient Medications:  .  acetaminophen (TYLENOL) 325 MG tablet, Take 2 tablets (650 mg total) by mouth every 4 (four)  hours as needed for mild pain or moderate pain., Disp: 30 tablet, Rfl: 1 .  benztropine (COGENTIN) 0.5 MG tablet, Take 0.5 mg by mouth 2 (two) times daily. , Disp: , Rfl:  .  buPROPion (WELLBUTRIN SR) 150 MG 12 hr tablet, Take 150 mg by mouth 2 (two) times daily., Disp: , Rfl:  .  carvedilol (COREG) 12.5 MG tablet, Take 1 tablet (12.5 mg total) by mouth 2 (two) times daily with a meal. (Patient taking differently: Take 12.5-25 mg by mouth every 12 (twelve) hours. ), Disp: 180 tablet, Rfl: 1 .  doxycycline (VIBRA-TABS) 100 MG tablet, Take 1 tablet (100 mg total) by mouth 2 (two) times daily for 30 days., Disp: 60 tablet, Rfl: 0 .  hydrOXYzine (ATARAX/VISTARIL) 10 MG tablet, Take 10 mg by mouth 3 (three) times daily as needed., Disp: , Rfl:  .  lamoTRIgine (LAMICTAL) 100 MG tablet, Take 100 mg by mouth 2 (two) times daily., Disp: , Rfl:  .  linaclotide (LINZESS) 145 MCG CAPS capsule, Take 1 capsule (145 mcg total) by mouth daily before breakfast., Disp: 30 capsule, Rfl: 1 .  lithium carbonate 300 MG capsule, , Disp: , Rfl:  .  lurasidone (LATUDA) 20 MG TABS tablet, Take 20 mg by mouth every evening., Disp: , Rfl:  .  Omega-3 Fatty Acids (FISH OIL) 1000 MG CAPS, Take by mouth 3 (three) times daily. , Disp: , Rfl:  .  omeprazole (PRILOSEC) 40 MG capsule, Take 1 capsule (40 mg total) by mouth daily., Disp: 90 capsule, Rfl: 3 .  pregabalin (LYRICA) 300 MG capsule, Take 1 capsule (300 mg total) by mouth 2 (two) times daily., Disp: 60 capsule, Rfl: 2 .  Prenatal Vit-Fe Fumarate-FA (PRENATAL VITAMIN PO), Take 1 tablet by mouth at bedtime., Disp: , Rfl:  .  terbinafine (LAMISIL) 250 MG tablet, Take 1 tablet (250 mg total) by mouth daily., Disp: 90 tablet, Rfl: 0 .  traZODone (DESYREL) 50 MG tablet, Take 100 mg by mouth at bedtime as needed for sleep. , Disp: , Rfl:  .  Vitamin D, Ergocalciferol, (DRISDOL) 50000 units CAPS capsule, Take 50,000 Units by mouth every 7 (seven) days. On Mondays, Disp: , Rfl:    EXAM:  VITALS per patient if applicable:Resp 12   LMP 04/30/2018   GENERAL: alert, oriented, appears well and in no acute distress  HEENT: atraumatic, conjunttiva clear, normocephalic, and no obvious facial abnormalities on inspection.  LUNGS: on inspection no signs of respiratory distress, breathing rate appears normal, no obvious gross SOB, gasping or wheezing  CV: no obvious cyanosis  PSYCH/NEURO: pleasant and cooperative, she is depressed and anxious, no suicidal. Speech and thought processing grossly intact  ASSESSMENT AND PLAN:  Discussed the following assessment and plan:  Forunculosis  Perineal abscess  We discussed possible etiologies and aggravating factors. Will try Doxycycline  again 100 mg bid,side effects discussed. Avoid scratching area. Appropriate skin hygiene. Instructed about warning signs.  Perineal lesions is improved,so I do nit think I&D is needed at this time,based on Hx. Instructed about warning signs. F/U with dermatologist. She states that she does not need referral,will let me know if she does.  I discussed the assessment and treatment plan with the patient. She was provided an opportunity to ask questions and all were answered. She agreed with the plan and demonstrated an understanding of the instructions.   The patient was advised to call back or seek an in-person evaluation if the symptoms worsen or if the condition fails to improve as anticipated.  Return if symptoms worsen or fail to improve.     [place], MD

## 2019-03-18 DIAGNOSIS — R69 Illness, unspecified: Secondary | ICD-10-CM | POA: Diagnosis not present

## 2019-03-19 ENCOUNTER — Encounter: Payer: Self-pay | Admitting: Family Medicine

## 2019-03-25 DIAGNOSIS — R69 Illness, unspecified: Secondary | ICD-10-CM | POA: Diagnosis not present

## 2019-03-31 ENCOUNTER — Other Ambulatory Visit: Payer: Self-pay | Admitting: Family Medicine

## 2019-03-31 DIAGNOSIS — M797 Fibromyalgia: Secondary | ICD-10-CM

## 2019-04-02 ENCOUNTER — Encounter: Payer: Self-pay | Admitting: Family Medicine

## 2019-04-02 ENCOUNTER — Ambulatory Visit (INDEPENDENT_AMBULATORY_CARE_PROVIDER_SITE_OTHER): Payer: Medicare HMO | Admitting: Family Medicine

## 2019-04-02 ENCOUNTER — Other Ambulatory Visit: Payer: Self-pay

## 2019-04-02 VITALS — Resp 12

## 2019-04-02 DIAGNOSIS — M797 Fibromyalgia: Secondary | ICD-10-CM | POA: Diagnosis not present

## 2019-04-02 DIAGNOSIS — Z0000 Encounter for general adult medical examination without abnormal findings: Secondary | ICD-10-CM

## 2019-04-02 DIAGNOSIS — I10 Essential (primary) hypertension: Secondary | ICD-10-CM | POA: Diagnosis not present

## 2019-04-02 DIAGNOSIS — L0292 Furuncle, unspecified: Secondary | ICD-10-CM

## 2019-04-02 DIAGNOSIS — K5900 Constipation, unspecified: Secondary | ICD-10-CM | POA: Insufficient documentation

## 2019-04-02 DIAGNOSIS — R69 Illness, unspecified: Secondary | ICD-10-CM | POA: Diagnosis not present

## 2019-04-02 MED ORDER — LINACLOTIDE 145 MCG PO CAPS: 145.0000 ug | ORAL_CAPSULE | Freq: Every day | ORAL | 1 refills | Status: DC

## 2019-04-02 NOTE — Assessment & Plan Note (Signed)
Problem improved with Linzess 145 mcg daily. Adequate fluid and fiber intake. No changes in current management.

## 2019-04-02 NOTE — Progress Notes (Signed)
Virtual Visit via Video Note   I connected with [name] [name] on 04/02/19 at  2:30 PM EDT by a video enabled telemedicine application and verified that I am speaking with the correct person using two identifiers.  Location patient: home Location provider:home office Persons participating in the virtual visit: patient, provider  I discussed the limitations of evaluation and management by telemedicine and the availability of in person appointments. She expressed understanding and agreed to proceed.   HPI:  [name] [name] is a 48 yo female seen today for chronic disease management. She was last seen on 03/17/2019 because of recurrent "boils."  She was started on doxycycline 100 mg twice daily and recommend following with her dermatologist. She does not feel like doxycycline is helping as it did in the past. She has a new lesion on her lower back. According to patient, she contacted her dermatologist office and she was instructed to wait until she has a "flareup."   Hypertension:  Diagnosed in 2018.  Currently on carvedilol 12.5 mg twice daily. She is not checking BP regularly. Last eye exam in 2018. She is taking medications as instructed, no side effects reported. She has not noted unusual headache, visual changes, exertional chest pain, dyspnea,  focal weakness, or edema. OSA, diagnosed in 2018, she follows with Dr. Halford Chessman and currently wearing CPAP.   Lab Results  Component Value Date   CREATININE 0.82 01/15/2019   BUN 16 01/15/2019   NA 139 01/15/2019   K 4.4 01/15/2019   CL 103 01/15/2019   CO2 27 01/15/2019   Constipation:She is taking Linzess 145 mcg daily ,which is "very much helping." She is taking medication daily, has a "big" loose stool. Abdominal pain and bloating sensation greatly improved after defecation. Negative for nausea, vomiting, or blood in the stool.  Fibromyalgia and polyarthralgia (generalized OA): She follows with rheumatologist in the past, negative  work-up.  + Fatigue, stable. Currently she is on Lyrica 300 mg twice daily, which helps. A few months ago she tried to decrease dose of Lyrica because it causes "memory problems" but dose was increased back to 300 mg twice daily because pain got worse. Fibromyalgia pain seems to be exacerbated by her stress.  States that she has "good and bad days", yesterday was a bad day.  Generalized myalgias and arthralgias, no joint edema or erythema. In the past she tried gabapentin and Cymbalta but they did not help.  She also would like to have her Medicare preventive visit, states that she needs one. She is on disability due to severe depression and chronic pain.  She lives with her parents.  Independent ADL's and IADL's. No falls in the past year.  She follows with psychiatrist for depression. Recently her meds were adjusted.  Wellbutrin XL was decreased from 300 mg daily to 150 mg daily. Tegretol was added.   She does not exercise regularly, she tries to follow a healthful diet but has not been consistent.  Functional Status Survey: Is the patient deaf or have difficulty hearing?: No Does the patient have difficulty seeing, even when wearing glasses/contacts?: No Does the patient have difficulty concentrating, remembering, or making decisions?: No Does the patient have difficulty walking or climbing stairs?: No Does the patient have difficulty dressing or bathing?: No Does the patient have difficulty doing errands alone such as visiting a doctor's office or shopping?: No  Fall Risk  04/02/2019  Falls in the past year? 0  Number falls in past yr: 0  Injury with  Fall? 0  Follow up Education provided     Providers she sees regularly: Eye care provider: Has not seen provider since 2018,she is looking for one that accepts her insurance. Psychotherapist, [name]. Garfield Cornea.  She sees her weekly. Psychiatrist, Dr. Corinna Capra sees her q 4-6 weeks. Dermatologists,prn. Gynecologist,  Dr. Marvel Plan. Dr Halford Chessman for OSA.  Depression screen PHQ 2/9 04/02/2019  Decreased Interest 3  Down, Depressed, Hopeless 3  PHQ - 2 Score 6  Some recent data might be hidden   Mini-Cog - 04/02/19 1513    Normal clock drawing test?  yes    How many words correct?  3      S/P hysterectomy. Mammogram 02/20/19 [place] 3,6 months f/u was recommended. Colonoscopy in 2007.  Vision Screening Comments: Virtual visit.   ROS: See pertinent positives and negatives per HPI.  Past Medical History:  Diagnosis Date  . Anxiety   . Bipolar disorder ([place])   . Chronic kidney disease    kidney infections  . Depression   . Fibromyalgia   . GERD (gastroesophageal reflux disease)   . History of cervical dysplasia   . History of exercise intolerance    normal ETT 10-24-2016  . History of kidney stones   . Hyperlipidemia   . Hypertension   . Migraines   . Osteoarthritis   . PCOS (polycystic ovarian syndrome)   . Pneumonia 2018  . Rash    in area of eswl 04-23-2017  . Right ureteral calculus   . Sleep apnea   . Tachycardia cardiologist-  dr harding   controlled w/ metoprolol    Past Surgical History:  Procedure Laterality Date  . ANTERIOR CERVICAL DECOMP/DISCECTOMY FUSION N/A 09/01/2015   Procedure: ANTERIOR CERVICAL DECOMPRESSION/DISCECTOMY FUSION PLATING BONEGRAFT CERVICAL FIVE-SIX;  Surgeon: Kevan Ny Ditty, MD;  Location: [facility];  Service: Neurosurgery;  Laterality: N/A;  . BREAST EXCISIONAL BIOPSY Bilateral left 10/15/2002;   right 1997   left ductal system excision (papilloma w/ florid epithelial hyperplasia)/  right benign lumpectomy  . CARDIOVASCULAR STRESS TEST  04/27/2009   normal nuclear study w/ no ischemia/  normal LV function and wall motion , ef 66%  . CHOLECYSTECTOMY N/A 03/13/2013   Procedure: LAPAROSCOPIC CHOLECYSTECTOMY;  Surgeon: Harl Bowie, MD;  Location: [facility];  Service: General;  Laterality: N/A;  . COLONOSCOPY  last one  11-08-2006  . CYSTOSCOPY N/A 05/08/2018   Procedure: CYSTOSCOPY;  Surgeon: Paula Compton, MD;  Location: [facility];  Service: Gynecology;  Laterality: N/A;  . CYSTOSCOPY WITH RETROGRADE PYELOGRAM, URETEROSCOPY AND STENT PLACEMENT Right 05/18/2017   Procedure: CYSTOSCOPY WITH RETROGRADE PYELOGRAM, URETEROSCOPY AND STENT PLACEMENT;  Surgeon: Alexis Frock, MD;  Location: [facility];  Service: Urology;  Laterality: Right;  . DX LAPAROSCOPY W/ LYSIS ADHESIONS/  LASER VAPORIZATION OF CERVIX  06/25/2002  . ESOPHAGOGASTRODUODENOSCOPY  04/25/2005  . EXPLORATORY LEFT THUMB REPAIR TENDON/ LACERATION  09/27/1999  . EXTRACORPOREAL SHOCK WAVE LITHOTRIPSY Right 04/23/2017   Procedure: RIGHT EXTRACORPOREAL SHOCK WAVE LITHOTRIPSY (ESWL);  Surgeon: Alexis Frock, MD;  Location: [facility];  Service: Urology;  Laterality: Right;  . EXTRACORPOREAL SHOCK WAVE LITHOTRIPSY Right 04/23/2017  . HOLMIUM LASER APPLICATION Right 1/61/0960   Procedure: HOLMIUM LASER APPLICATION;  Surgeon: Alexis Frock, MD;  [facility];  Service: Urology;  Laterality: Right;  . LAPAROSCOPIC VAGINAL HYSTERECTOMY WITH SALPINGECTOMY Bilateral 05/08/2018   Procedure: LAPAROSCOPIC ASSISTED VAGINAL HYSTERECTOMY WITH SALPINGECTOMY,  I & D Sebaceous Left Labia;  Surgeon: Paula Compton,  MD;  Location: [facility];  Service: Gynecology;  Laterality: Bilateral;  . LAPAROSCOPY  07/30/2012   Procedure: LAPAROSCOPY DIAGNOSTIC;  Surgeon: Logan Bores, MD;  Location: [facility];  Service: Gynecology;  Laterality: N/A;  . WISDOM TOOTH EXTRACTION      Family History  Problem Relation Age of Onset  . Diabetes Father   . Hypertension Father   . Arthritis Father   . Depression Father   . Hearing loss Father   . Diabetes Mother   . Hypertension Mother   . Arthritis Mother   . Depression Mother   . Asthma Paternal Grandmother   . Kidney disease Paternal Grandmother   . Arthritis Paternal Grandmother   . Cancer  Paternal Grandmother   . COPD Paternal Grandmother   . Hearing loss Paternal Grandmother   . Heart disease Paternal Grandmother   . Hypertension Paternal Grandmother   . Hyperlipidemia Paternal Grandmother   . Stroke Paternal Grandmother   . Heart attack Paternal Grandmother   . Depression Sister   . Emphysema Other   . Tongue cancer Other   . Coronary artery disease Other   . COPD Son   . Drug abuse Son   . Arthritis Maternal Grandmother   . COPD Maternal Grandmother   . Depression Maternal Grandmother   . Heart disease Maternal Grandmother   . Early death Maternal Grandfather   . Heart attack Maternal Grandfather   . COPD Paternal Grandfather   . Heart attack Paternal Grandfather   . Breast cancer Neg Hx     Social History   Socioeconomic History  . Marital status: Legally Separated    Spouse name: Not on file  . Number of children: 2  . Years of education: Not on file  . Highest education level: Not on file  Occupational History  . Occupation: Unemployed  Social Needs  . Financial resource strain: Not on file  . Food insecurity:    Worry: Not on file    Inability: Not on file  . Transportation needs:    Medical: Not on file    Non-medical: Not on file  Tobacco Use  . Smoking status: Current Some Day Smoker    Packs/day: 1.00    Years: 25.00    Pack years: 25.00    Types: Cigarettes  . Smokeless tobacco: Never Used  . Tobacco comment: 6-7 cig. daily  Substance and Sexual Activity  . Alcohol use: No  . Drug use: No    Types: Cocaine    Comment: last cocaine use 2014  . Sexual activity: Not Currently    Partners: Male    Birth control/protection: None  Lifestyle  . Physical activity:    Days per week: Not on file    Minutes per session: Not on file  . Stress: Not on file  Relationships  . Social connections:    Talks on phone: Not on file    Gets together: Not on file    Attends religious service: Not on file    Active member of club or  organization: Not on file    Attends meetings of clubs or organizations: Not on file    Relationship status: Not on file  . Intimate partner violence:    Fear of current or ex partner: Not on file    Emotionally abused: Not on file    Physically abused: Not on file    Forced sexual activity: Not on file  Other Topics Concern  . Not on file  Social History Narrative   She lives at home with her spouse. She does not exercise.   She currently denies recent recreational drug use.     Current Outpatient Medications:  .  buPROPion (WELLBUTRIN XL) 150 MG 24 hr tablet, Take 150 mg by mouth daily., Disp: , Rfl:  .  acetaminophen (TYLENOL) 325 MG tablet, Take 2 tablets (650 mg total) by mouth every 4 (four) hours as needed for mild pain or moderate pain., Disp: 30 tablet, Rfl: 1 .  benztropine (COGENTIN) 0.5 MG tablet, Take 0.5 mg by mouth 2 (two) times daily. , Disp: , Rfl:  .  carvedilol (COREG) 12.5 MG tablet, Take 1 tablet (12.5 mg total) by mouth 2 (two) times daily with a meal. (Patient taking differently: Take 12.5-25 mg by mouth every 12 (twelve) hours. ), Disp: 180 tablet, Rfl: 1 .  doxycycline (VIBRA-TABS) 100 MG tablet, Take 1 tablet (100 mg total) by mouth 2 (two) times daily for 30 days., Disp: 60 tablet, Rfl: 0 .  hydrOXYzine (ATARAX/VISTARIL) 10 MG tablet, Take 10 mg by mouth 3 (three) times daily as needed., Disp: , Rfl:  .  lamoTRIgine (LAMICTAL) 100 MG tablet, Take 100 mg by mouth 2 (two) times daily., Disp: , Rfl:  .  linaclotide (LINZESS) 145 MCG CAPS capsule, Take 1 capsule (145 mcg total) by mouth daily before breakfast., Disp: 90 capsule, Rfl: 1 .  lithium carbonate 300 MG capsule, , Disp: , Rfl:  .  lurasidone (LATUDA) 20 MG TABS tablet, Take 20 mg by mouth every evening., Disp: , Rfl:  .  Omega-3 Fatty Acids (FISH OIL) 1000 MG CAPS, Take by mouth 3 (three) times daily. , Disp: , Rfl:  .  omeprazole (PRILOSEC) 40 MG capsule, Take 1 capsule (40 mg total) by mouth daily.,  Disp: 90 capsule, Rfl: 3 .  pregabalin (LYRICA) 300 MG capsule, TAKE 1 CAPSULE BY MOUTH TWICE DAILY, Disp: 60 capsule, Rfl: 3 .  Prenatal Vit-Fe Fumarate-FA (PRENATAL VITAMIN PO), Take 1 tablet by mouth at bedtime., Disp: , Rfl:  .  terbinafine (LAMISIL) 250 MG tablet, Take 1 tablet (250 mg total) by mouth daily., Disp: 90 tablet, Rfl: 0 .  traZODone (DESYREL) 50 MG tablet, Take 100 mg by mouth at bedtime as needed for sleep. , Disp: , Rfl:  .  Vitamin D, Ergocalciferol, (DRISDOL) 50000 units CAPS capsule, Take 50,000 Units by mouth every 7 (seven) days. On Mondays, Disp: , Rfl:   EXAM:  VITALS per patient if applicable:Resp 12   LMP 04/30/2018   GENERAL: alert, oriented, appears well and in no acute distress  HEENT: atraumatic, conjunctiva clear, no obvious facial abnormalities on inspection.  NECK: Otherwise normal movements of the head and neck  LUNGS: on inspection no signs of respiratory distress, breathing rate appears normal, no obvious gross SOB, gasping or wheezing   CV: no obvious cyanosis  [name]: moves all visible extremities without noticeable abnormality  PSYCH/NEURO: pleasant and cooperative, no obvious depression,+anxious ,speech and thought processing grossly intact  ASSESSMENT AND PLAN:  Discussed the following assessment and plan:  Medicare annual wellness visit, subsequent We discussed the importance of staying active, physically and mentally, as well as the benefits of a healthy/balance diet. Low impact exercise that involve stretching and strengthing are ideal, given her history of polyarthritis and fibromyalgia.  Vaccines up to date. We discussed preventive screening for the next 5-10 years, summery of recommendations reviewed. Colonoscopy at age 66. Mammogram in 08/2019. DEXA between 66 and 65 years  old. Status post hysterectomy, no history of malignancy, so Pap smear is not longer necessary. Annual eye exam. Lipid panel annually. Diabetes screening every  1 to 3 years.  Smoking cessation encouraged, she is down to 3 cigarettes/day (she was smoking 1PPD until a week ago), planning on quitting in the next few days.  Advance directives and end of life discussed, strongly recommend considering it.Web site information give.    HTN (hypertension) BP is adequately controlled. Continue carvedilol 12.5 mg twice daily. Recommend monitoring BP periodically. Continue low-salt diet. Eye exam is due. Follow-up in 5 months.   Forunculosis Problem still is not well controlled with doxycycline 100 mg twice daily. Recommend calling her dermatologist office to arrange appointment.  Constipation Problem improved with Linzess 145 mcg daily. Adequate fluid and fiber intake. No changes in current management.  DEPRESSION Problem is not well controlled. Recently her medications were adjusted. Continue following with psychotherapist weekly and with psychiatrist monthly.     I discussed the assessment and treatment plan with the patient. She was provided an opportunity to ask questions and all were answered. The patient agreed with the plan and demonstrated an understanding of the instructions.     Return in about 5 months (around 09/02/2019) for f/u.    Rayvn Rickerson [place], MD

## 2019-04-02 NOTE — Assessment & Plan Note (Addendum)
BP is adequately controlled. Continue carvedilol 12.5 mg twice daily. Recommend monitoring BP periodically. Continue low-salt diet. Eye exam is due. Follow-up in 5 months.

## 2019-04-02 NOTE — Assessment & Plan Note (Signed)
Problem still is not well controlled with doxycycline 100 mg twice daily. Recommend calling her dermatologist office to arrange appointment.

## 2019-04-02 NOTE — Assessment & Plan Note (Signed)
Problem is not well controlled. Recently her medications were adjusted. Continue following with psychotherapist weekly and with psychiatrist monthly.

## 2019-04-09 DIAGNOSIS — R69 Illness, unspecified: Secondary | ICD-10-CM | POA: Diagnosis not present

## 2019-04-10 DIAGNOSIS — R69 Illness, unspecified: Secondary | ICD-10-CM | POA: Diagnosis not present

## 2019-04-16 DIAGNOSIS — R69 Illness, unspecified: Secondary | ICD-10-CM | POA: Diagnosis not present

## 2019-04-22 DIAGNOSIS — R748 Abnormal levels of other serum enzymes: Secondary | ICD-10-CM | POA: Diagnosis not present

## 2019-04-22 DIAGNOSIS — K219 Gastro-esophageal reflux disease without esophagitis: Secondary | ICD-10-CM | POA: Diagnosis not present

## 2019-04-22 DIAGNOSIS — R635 Abnormal weight gain: Secondary | ICD-10-CM | POA: Diagnosis not present

## 2019-04-22 DIAGNOSIS — R079 Chest pain, unspecified: Secondary | ICD-10-CM | POA: Diagnosis not present

## 2019-04-22 DIAGNOSIS — K5904 Chronic idiopathic constipation: Secondary | ICD-10-CM | POA: Diagnosis not present

## 2019-04-22 DIAGNOSIS — R1310 Dysphagia, unspecified: Secondary | ICD-10-CM | POA: Diagnosis not present

## 2019-04-23 DIAGNOSIS — R69 Illness, unspecified: Secondary | ICD-10-CM | POA: Diagnosis not present

## 2019-04-24 ENCOUNTER — Other Ambulatory Visit: Payer: Self-pay | Admitting: Gastroenterology

## 2019-04-24 DIAGNOSIS — R1013 Epigastric pain: Secondary | ICD-10-CM

## 2019-04-25 ENCOUNTER — Other Ambulatory Visit: Payer: Self-pay | Admitting: Gastroenterology

## 2019-04-28 ENCOUNTER — Telehealth: Payer: Self-pay | Admitting: *Deleted

## 2019-04-28 ENCOUNTER — Other Ambulatory Visit: Payer: Self-pay

## 2019-04-28 ENCOUNTER — Ambulatory Visit
Admission: RE | Admit: 2019-04-28 | Discharge: 2019-04-28 | Disposition: A | Discharge status: Home / Self Care | Payer: Medicare HMO | Source: Ambulatory Visit | Attending: Gastroenterology | Admitting: Gastroenterology

## 2019-04-28 DIAGNOSIS — R1013 Epigastric pain: Secondary | ICD-10-CM

## 2019-04-28 DIAGNOSIS — N200 Calculus of kidney: Secondary | ICD-10-CM | POA: Diagnosis not present

## 2019-04-28 MED ORDER — IOPAMIDOL (ISOVUE-300) INJECTION 61%
125.0000 mL | Freq: Once | INTRAVENOUS | Status: AC | PRN
  Administered 2019-04-28: 125 mL via INTRAVENOUS

## 2019-04-28 NOTE — Telephone Encounter (Signed)
Message sent to Dr. Jordan for review. 

## 2019-04-28 NOTE — Telephone Encounter (Signed)
Copied from [place] [id number]. Topic: General - Other >> Apr 28, 2019  2:35 PM Leward Quan A wrote: Reason for CRM: Patient called to say that she saw the GI doctor last week and had a CT scan today at [facility] and they could not find anything wrong. Say that she is still having the pain that goes to her back and that the pain comes to RLQ especially when she bend over. Asking for some guidance.  Ph# [phone]

## 2019-05-01 DIAGNOSIS — R69 Illness, unspecified: Secondary | ICD-10-CM | POA: Diagnosis not present

## 2019-05-06 DIAGNOSIS — M5416 Radiculopathy, lumbar region: Secondary | ICD-10-CM | POA: Diagnosis not present

## 2019-05-06 DIAGNOSIS — N200 Calculus of kidney: Secondary | ICD-10-CM | POA: Diagnosis not present

## 2019-05-06 DIAGNOSIS — M5126 Other intervertebral disc displacement, lumbar region: Secondary | ICD-10-CM | POA: Diagnosis not present

## 2019-05-07 NOTE — Telephone Encounter (Signed)
Pain cold be muscular,in which case muscle relaxants and local heat/ice may help. I recommend following with GI if she continues having abdominal pain.  Thanks, BJ

## 2019-05-07 NOTE — Telephone Encounter (Signed)
Discussed results with patient, patient expressed understanding. Nothing further needed. ° °

## 2019-05-08 DIAGNOSIS — R69 Illness, unspecified: Secondary | ICD-10-CM | POA: Diagnosis not present

## 2019-05-12 ENCOUNTER — Other Ambulatory Visit: Payer: Self-pay | Admitting: Neurosurgery

## 2019-05-12 DIAGNOSIS — M5126 Other intervertebral disc displacement, lumbar region: Secondary | ICD-10-CM

## 2019-05-13 ENCOUNTER — Encounter: Payer: Self-pay | Admitting: Family Medicine

## 2019-05-13 ENCOUNTER — Other Ambulatory Visit: Payer: Self-pay

## 2019-05-13 ENCOUNTER — Ambulatory Visit (INDEPENDENT_AMBULATORY_CARE_PROVIDER_SITE_OTHER): Payer: Medicare HMO | Admitting: Family Medicine

## 2019-05-13 DIAGNOSIS — R21 Rash and other nonspecific skin eruption: Secondary | ICD-10-CM | POA: Diagnosis not present

## 2019-05-13 DIAGNOSIS — S80862A Insect bite (nonvenomous), left lower leg, initial encounter: Secondary | ICD-10-CM

## 2019-05-13 DIAGNOSIS — R69 Illness, unspecified: Secondary | ICD-10-CM | POA: Diagnosis not present

## 2019-05-13 DIAGNOSIS — W57XXXA Bitten or stung by nonvenomous insect and other nonvenomous arthropods, initial encounter: Secondary | ICD-10-CM

## 2019-05-13 MED ORDER — DOXYCYCLINE HYCLATE 100 MG PO TABS: 100.0000 mg | ORAL_TABLET | Freq: Two times a day (BID) | ORAL | 0 refills | Status: AC

## 2019-05-13 NOTE — Progress Notes (Signed)
Virtual Visit via Video Note  I connected with [name] [name] on 05/13/19 at  3:30 PM EDT by a video enabled telemedicine application and verified that I am speaking with the correct person using two identifiers.  Location patient: home Location provider:work or home office Persons participating in the virtual visit: patient, provider  I discussed the limitations of evaluation and management by telemedicine and the availability of in person appointments. The patient expressed understanding and agreed to proceed.   HPI: Pt noticed a tick on her L inner calf 2 wks ago.  Pt states her mother picked the tick of with tweezers.  The area of the bite developed a "white head" which pt popped.  The area now has small red bumps around it.  The area itches and is warmer than the surrounding skin.   Pt denies fever, temp today 97.7 F.   Pt has joint pain at baseline 2/2 fibromyalgia and arthritis.  Pt has not tried anything for the area.   ROS: See pertinent positives and negatives per HPI.  Past Medical History:  Diagnosis Date  . Anxiety   . Bipolar disorder ([place])   . Chronic kidney disease    kidney infections  . Depression   . Fibromyalgia   . GERD (gastroesophageal reflux disease)   . History of cervical dysplasia   . History of exercise intolerance    normal ETT 10-24-2016  . History of kidney stones   . Hyperlipidemia   . Hypertension   . Migraines   . Osteoarthritis   . PCOS (polycystic ovarian syndrome)   . Pneumonia 2018  . Rash    in area of eswl 04-23-2017  . Right ureteral calculus   . Sleep apnea   . Tachycardia cardiologist-  dr harding   controlled w/ metoprolol    Past Surgical History:  Procedure Laterality Date  . ANTERIOR CERVICAL DECOMP/DISCECTOMY FUSION N/A 09/01/2015   Procedure: ANTERIOR CERVICAL DECOMPRESSION/DISCECTOMY FUSION PLATING BONEGRAFT CERVICAL FIVE-SIX;  Surgeon: Kevan Ny Ditty, MD;  Location: [facility];  Service: Neurosurgery;   Laterality: N/A;  . BREAST EXCISIONAL BIOPSY Bilateral left 10/15/2002;   right 1997   left ductal system excision (papilloma w/ florid epithelial hyperplasia)/  right benign lumpectomy  . CARDIOVASCULAR STRESS TEST  04/27/2009   normal nuclear study w/ no ischemia/  normal LV function and wall motion , ef 66%  . CHOLECYSTECTOMY N/A 03/13/2013   Procedure: LAPAROSCOPIC CHOLECYSTECTOMY;  Surgeon: Harl Bowie, MD;  Location: [facility];  Service: General;  Laterality: N/A;  . COLONOSCOPY  last one 11-08-2006  . CYSTOSCOPY N/A 05/08/2018   Procedure: CYSTOSCOPY;  Surgeon: Paula Compton, MD;  Location: [facility];  Service: Gynecology;  Laterality: N/A;  . CYSTOSCOPY WITH RETROGRADE PYELOGRAM, URETEROSCOPY AND STENT PLACEMENT Right 05/18/2017   Procedure: CYSTOSCOPY WITH RETROGRADE PYELOGRAM, URETEROSCOPY AND STENT PLACEMENT;  Surgeon: Alexis Frock, MD;  Location: [facility];  Service: Urology;  Laterality: Right;  . DX LAPAROSCOPY W/ LYSIS ADHESIONS/  LASER VAPORIZATION OF CERVIX  06/25/2002  . ESOPHAGOGASTRODUODENOSCOPY  04/25/2005  . EXPLORATORY LEFT THUMB REPAIR TENDON/ LACERATION  09/27/1999  . EXTRACORPOREAL SHOCK WAVE LITHOTRIPSY Right 04/23/2017   Procedure: RIGHT EXTRACORPOREAL SHOCK WAVE LITHOTRIPSY (ESWL);  Surgeon: Alexis Frock, MD;  Location: [facility];  Service: Urology;  Laterality: Right;  . EXTRACORPOREAL SHOCK WAVE LITHOTRIPSY Right 04/23/2017  . HOLMIUM LASER APPLICATION Right 2/70/6237   Procedure: HOLMIUM LASER APPLICATION;  Surgeon: Alexis Frock, MD;  Location: [facility];  Service: Urology;  Laterality: Right;  . LAPAROSCOPIC VAGINAL HYSTERECTOMY WITH SALPINGECTOMY Bilateral 05/08/2018   Procedure: LAPAROSCOPIC ASSISTED VAGINAL HYSTERECTOMY WITH SALPINGECTOMY,  I & D Sebaceous Left Labia;  Surgeon: Paula Compton, MD;  Location: [facility];  Service: Gynecology;  Laterality: Bilateral;  . LAPAROSCOPY  07/30/2012    Procedure: LAPAROSCOPY DIAGNOSTIC;  Surgeon: Logan Bores, MD;  Location: [facility];  Service: Gynecology;  Laterality: N/A;  . WISDOM TOOTH EXTRACTION      Family History  Problem Relation Age of Onset  . Diabetes Father   . Hypertension Father   . Arthritis Father   . Depression Father   . Hearing loss Father   . Diabetes Mother   . Hypertension Mother   . Arthritis Mother   . Depression Mother   . Asthma Paternal Grandmother   . Kidney disease Paternal Grandmother   . Arthritis Paternal Grandmother   . Cancer Paternal Grandmother   . COPD Paternal Grandmother   . Hearing loss Paternal Grandmother   . Heart disease Paternal Grandmother   . Hypertension Paternal Grandmother   . Hyperlipidemia Paternal Grandmother   . Stroke Paternal Grandmother   . Heart attack Paternal Grandmother   . Depression Sister   . Emphysema Other   . Tongue cancer Other   . Coronary artery disease Other   . COPD Son   . Drug abuse Son   . Arthritis Maternal Grandmother   . COPD Maternal Grandmother   . Depression Maternal Grandmother   . Heart disease Maternal Grandmother   . Early death Maternal Grandfather   . Heart attack Maternal Grandfather   . COPD Paternal Grandfather   . Heart attack Paternal Grandfather   . Breast cancer Neg Hx      Current Outpatient Medications:  .  acetaminophen (TYLENOL) 500 MG tablet, Take 1,000 mg by mouth every 6 (six) hours as needed for moderate pain., Disp: , Rfl:  .  benztropine (COGENTIN) 1 MG tablet, Take 1 mg by mouth 2 (two) times daily., Disp: , Rfl:  .  buPROPion (WELLBUTRIN SR) 150 MG 12 hr tablet, Take 150 mg by mouth every evening., Disp: , Rfl:  .  carbamazepine (TEGRETOL XR) 100 MG 12 hr tablet, Take 100-200 mg by mouth See admin instructions. Take 100 mg in the morning and 200 mg at night, Disp: , Rfl:  .  carvedilol (COREG) 12.5 MG tablet, Take 1 tablet (12.5 mg total) by mouth 2 (two) times daily with a meal. (Patient not taking:  Reported on 05/13/2019), Disp: 180 tablet, Rfl: 1 .  cycloSPORINE (RESTASIS) 0.05 % ophthalmic emulsion, Place 1 drop into both eyes 2 (two) times daily as needed (dry eyes)., Disp: , Rfl:  .  hydrOXYzine (ATARAX/VISTARIL) 10 MG tablet, Take 10 mg by mouth 3 (three) times daily as needed for anxiety. , Disp: , Rfl:  .  lamoTRIgine (LAMICTAL) 100 MG tablet, Take 100 mg by mouth 2 (two) times daily., Disp: , Rfl:  .  linaclotide (LINZESS) 145 MCG CAPS capsule, Take 1 capsule (145 mcg total) by mouth daily before breakfast., Disp: 90 capsule, Rfl: 1 .  lurasidone (LATUDA) 20 MG TABS tablet, Take 20 mg by mouth at bedtime. , Disp: , Rfl:  .  omeprazole (PRILOSEC) 40 MG capsule, Take 1 capsule (40 mg total) by mouth daily. (Patient taking differently: Take 40 mg by mouth every evening. ), Disp: 90 capsule, Rfl: 3 .  pregabalin (LYRICA) 300 MG capsule, TAKE 1 CAPSULE BY MOUTH TWICE  DAILY (Patient taking differently: Take 300 mg by mouth 2 (two) times daily. ), Disp: 60 capsule, Rfl: 3 .  terbinafine (LAMISIL) 250 MG tablet, Take 1 tablet (250 mg total) by mouth daily. (Patient not taking: Reported on 05/13/2019), Disp: 90 tablet, Rfl: 0 .  traZODone (DESYREL) 50 MG tablet, Take 50-100 mg by mouth at bedtime as needed for sleep. , Disp: , Rfl:   EXAM:  VITALS per patient if applicable:  Temp 01.6P.  RR between 12-20 bpm  GENERAL: alert, oriented, appears well and in no acute distress  HEENT: atraumatic, conjunctiva clear, no obvious abnormalities on inspection of external nose and ears  NECK: normal movements of the head and neck  LUNGS: on inspection no signs of respiratory distress, breathing rate appears normal, no obvious gross SOB, gasping or wheezing  CV: no obvious cyanosis  SKIN: L medial mid lower leg/inferiror calf with a erythematous papule with scattered, smaller, erythematous papules surrounding.  No areas of clearing or target lesions noted.  No drainage present.  MS: moves all  visible extremities without noticeable abnormality  PSYCH/NEURO: pleasant and cooperative, no obvious depression or anxiety, speech and thought processing grossly intact  ASSESSMENT AND PLAN:  Discussed the following assessment and plan:  Tick bite of calf, left, initial encounter  -discussed concern for tick borne illnesses. -pt given precautions -advised of possible S/Es from Doxycycline use. - Plan: doxycycline (VIBRA-TABS) 100 MG tablet BID x 2 wks.  Rash/skin eruption  -no target lesion present, however will start abx given presence of rash at site of tick bite. -ok to use cortisone cream for pruritis. - Plan: doxycycline (VIBRA-TABS) 100 MG tablet -given precuations  F/u prn   I discussed the assessment and treatment plan with the patient. The patient was provided an opportunity to ask questions and all were answered. The patient agreed with the plan and demonstrated an understanding of the instructions.   The patient was advised to call back or seek an in-person evaluation if the symptoms worsen or if the condition fails to improve as anticipated.   Billie Ruddy, MD

## 2019-05-14 ENCOUNTER — Other Ambulatory Visit: Payer: Self-pay

## 2019-05-14 ENCOUNTER — Ambulatory Visit
Admission: RE | Admit: 2019-05-14 | Discharge: 2019-05-14 | Disposition: A | Discharge status: Home / Self Care | Payer: Medicare HMO | Source: Ambulatory Visit | Attending: Neurosurgery | Admitting: Neurosurgery

## 2019-05-14 DIAGNOSIS — M5126 Other intervertebral disc displacement, lumbar region: Secondary | ICD-10-CM

## 2019-05-14 DIAGNOSIS — M48061 Spinal stenosis, lumbar region without neurogenic claudication: Secondary | ICD-10-CM | POA: Diagnosis not present

## 2019-05-15 DIAGNOSIS — M5126 Other intervertebral disc displacement, lumbar region: Secondary | ICD-10-CM | POA: Diagnosis not present

## 2019-05-16 ENCOUNTER — Other Ambulatory Visit (HOSPITAL_COMMUNITY)
Admission: RE | Admit: 2019-05-16 | Discharge: 2019-05-16 | Disposition: A | Discharge status: Home / Self Care | Payer: Medicare HMO | Source: Ambulatory Visit | Attending: Gastroenterology | Admitting: Gastroenterology

## 2019-05-16 ENCOUNTER — Telehealth: Payer: Self-pay | Admitting: Family Medicine

## 2019-05-16 DIAGNOSIS — Z1159 Encounter for screening for other viral diseases: Secondary | ICD-10-CM | POA: Diagnosis not present

## 2019-05-16 LAB — SARS CORONAVIRUS 2 (TAT 6-24 HRS): SARS Coronavirus 2: NEGATIVE

## 2019-05-16 NOTE — Telephone Encounter (Signed)
Pt thinks the doxycycline (VIBRA-TABS) 100 MG tablet is causing her to be depressed. Pt want to know if she can be changed to something else. Please advise

## 2019-05-16 NOTE — Telephone Encounter (Signed)
Message sent to Dr. Jordan for review and approval. 

## 2019-05-19 ENCOUNTER — Other Ambulatory Visit: Payer: Self-pay

## 2019-05-19 ENCOUNTER — Encounter (HOSPITAL_COMMUNITY): Payer: Self-pay | Admitting: *Deleted

## 2019-05-19 NOTE — Telephone Encounter (Signed)
Please advise patient to discontinue doxycycline. At this time I am not recommending new medication, I prefer for dermatologist to make recommendations. Thanks, BJ

## 2019-05-19 NOTE — Telephone Encounter (Signed)
Spoke with patient and given recommendations. Patient verbalized understanding. Patient stated that she had called office back on Friday and told them that she was seen by Dr. Volanda Napoleon for a tick bite and that she was experiencing headache and extreme fatigue. Patient stated that her sx's are better and that the tick bite is starting to look better.

## 2019-05-20 ENCOUNTER — Ambulatory Visit (HOSPITAL_COMMUNITY): Payer: Medicare HMO | Admitting: Certified Registered Nurse Anesthetist

## 2019-05-20 ENCOUNTER — Encounter (HOSPITAL_COMMUNITY): Payer: Self-pay | Admitting: *Deleted

## 2019-05-20 ENCOUNTER — Encounter (HOSPITAL_COMMUNITY)
Admission: RE | Disposition: A | Discharge status: Home / Self Care | Payer: Self-pay | Source: Home / Self Care | Attending: Gastroenterology

## 2019-05-20 ENCOUNTER — Ambulatory Visit (HOSPITAL_COMMUNITY)
Admission: RE | Admit: 2019-05-20 | Discharge: 2019-05-20 | Disposition: A | Discharge status: Home / Self Care | Payer: Medicare HMO | Attending: Gastroenterology | Admitting: Gastroenterology

## 2019-05-20 DIAGNOSIS — Z87442 Personal history of urinary calculi: Secondary | ICD-10-CM | POA: Diagnosis not present

## 2019-05-20 DIAGNOSIS — F419 Anxiety disorder, unspecified: Secondary | ICD-10-CM | POA: Diagnosis not present

## 2019-05-20 DIAGNOSIS — K219 Gastro-esophageal reflux disease without esophagitis: Secondary | ICD-10-CM | POA: Diagnosis not present

## 2019-05-20 DIAGNOSIS — Z809 Family history of malignant neoplasm, unspecified: Secondary | ICD-10-CM | POA: Insufficient documentation

## 2019-05-20 DIAGNOSIS — Z818 Family history of other mental and behavioral disorders: Secondary | ICD-10-CM | POA: Insufficient documentation

## 2019-05-20 DIAGNOSIS — M797 Fibromyalgia: Secondary | ICD-10-CM | POA: Insufficient documentation

## 2019-05-20 DIAGNOSIS — F17210 Nicotine dependence, cigarettes, uncomplicated: Secondary | ICD-10-CM | POA: Insufficient documentation

## 2019-05-20 DIAGNOSIS — F319 Bipolar disorder, unspecified: Secondary | ICD-10-CM | POA: Diagnosis not present

## 2019-05-20 DIAGNOSIS — R1310 Dysphagia, unspecified: Secondary | ICD-10-CM | POA: Diagnosis not present

## 2019-05-20 DIAGNOSIS — D124 Benign neoplasm of descending colon: Secondary | ICD-10-CM | POA: Diagnosis not present

## 2019-05-20 DIAGNOSIS — Z8261 Family history of arthritis: Secondary | ICD-10-CM | POA: Diagnosis not present

## 2019-05-20 DIAGNOSIS — R1031 Right lower quadrant pain: Secondary | ICD-10-CM | POA: Insufficient documentation

## 2019-05-20 DIAGNOSIS — G4730 Sleep apnea, unspecified: Secondary | ICD-10-CM | POA: Diagnosis not present

## 2019-05-20 DIAGNOSIS — Z8249 Family history of ischemic heart disease and other diseases of the circulatory system: Secondary | ICD-10-CM | POA: Diagnosis not present

## 2019-05-20 DIAGNOSIS — Z823 Family history of stroke: Secondary | ICD-10-CM | POA: Insufficient documentation

## 2019-05-20 DIAGNOSIS — D122 Benign neoplasm of ascending colon: Secondary | ICD-10-CM | POA: Diagnosis not present

## 2019-05-20 DIAGNOSIS — Z9049 Acquired absence of other specified parts of digestive tract: Secondary | ICD-10-CM | POA: Insufficient documentation

## 2019-05-20 DIAGNOSIS — M1990 Unspecified osteoarthritis, unspecified site: Secondary | ICD-10-CM | POA: Diagnosis not present

## 2019-05-20 DIAGNOSIS — K648 Other hemorrhoids: Secondary | ICD-10-CM | POA: Insufficient documentation

## 2019-05-20 DIAGNOSIS — Z886 Allergy status to analgesic agent status: Secondary | ICD-10-CM | POA: Insufficient documentation

## 2019-05-20 DIAGNOSIS — Z888 Allergy status to other drugs, medicaments and biological substances status: Secondary | ICD-10-CM | POA: Insufficient documentation

## 2019-05-20 DIAGNOSIS — G43909 Migraine, unspecified, not intractable, without status migrainosus: Secondary | ICD-10-CM | POA: Diagnosis not present

## 2019-05-20 DIAGNOSIS — K317 Polyp of stomach and duodenum: Secondary | ICD-10-CM | POA: Insufficient documentation

## 2019-05-20 DIAGNOSIS — Z833 Family history of diabetes mellitus: Secondary | ICD-10-CM | POA: Insufficient documentation

## 2019-05-20 DIAGNOSIS — Z79899 Other long term (current) drug therapy: Secondary | ICD-10-CM | POA: Insufficient documentation

## 2019-05-20 DIAGNOSIS — Z1211 Encounter for screening for malignant neoplasm of colon: Secondary | ICD-10-CM | POA: Diagnosis not present

## 2019-05-20 DIAGNOSIS — Z841 Family history of disorders of kidney and ureter: Secondary | ICD-10-CM | POA: Insufficient documentation

## 2019-05-20 DIAGNOSIS — K5900 Constipation, unspecified: Secondary | ICD-10-CM | POA: Insufficient documentation

## 2019-05-20 DIAGNOSIS — R000 Tachycardia, unspecified: Secondary | ICD-10-CM | POA: Insufficient documentation

## 2019-05-20 DIAGNOSIS — E785 Hyperlipidemia, unspecified: Secondary | ICD-10-CM | POA: Diagnosis not present

## 2019-05-20 DIAGNOSIS — Z87440 Personal history of urinary (tract) infections: Secondary | ICD-10-CM | POA: Insufficient documentation

## 2019-05-20 DIAGNOSIS — E282 Polycystic ovarian syndrome: Secondary | ICD-10-CM | POA: Diagnosis not present

## 2019-05-20 DIAGNOSIS — R69 Illness, unspecified: Secondary | ICD-10-CM | POA: Diagnosis not present

## 2019-05-20 DIAGNOSIS — Z981 Arthrodesis status: Secondary | ICD-10-CM | POA: Insufficient documentation

## 2019-05-20 DIAGNOSIS — Z825 Family history of asthma and other chronic lower respiratory diseases: Secondary | ICD-10-CM | POA: Insufficient documentation

## 2019-05-20 DIAGNOSIS — Z800 Family history of malignant neoplasm of digestive organs: Secondary | ICD-10-CM | POA: Insufficient documentation

## 2019-05-20 HISTORY — PX: ESOPHAGOGASTRODUODENOSCOPY (EGD) WITH PROPOFOL: SHX5813

## 2019-05-20 HISTORY — PX: BIOPSY: SHX5522

## 2019-05-20 HISTORY — PX: COLONOSCOPY: SHX5424

## 2019-05-20 HISTORY — PX: POLYPECTOMY: SHX5525

## 2019-05-20 SURGERY — ESOPHAGOGASTRODUODENOSCOPY (EGD) WITH PROPOFOL
Anesthesia: Monitor Anesthesia Care

## 2019-05-20 MED ORDER — MIDAZOLAM HCL 2 MG/2ML IJ SOLN
INTRAMUSCULAR | Status: AC
  Filled 2019-05-20: qty 2

## 2019-05-20 MED ORDER — SODIUM CHLORIDE 0.9 % IV SOLN: INTRAVENOUS | Status: DC

## 2019-05-20 MED ORDER — PROPOFOL 10 MG/ML IV BOLUS
INTRAVENOUS | Status: DC | PRN
  Administered 2019-05-20: 40 mg via INTRAVENOUS

## 2019-05-20 MED ORDER — ONDANSETRON HCL 4 MG/2ML IJ SOLN
INTRAMUSCULAR | Status: DC | PRN
  Administered 2019-05-20: 4 mg via INTRAVENOUS

## 2019-05-20 MED ORDER — MIDAZOLAM HCL 2 MG/2ML IJ SOLN
INTRAMUSCULAR | Status: DC | PRN
  Administered 2019-05-20: 2 mg via INTRAVENOUS

## 2019-05-20 MED ORDER — SCOPOLAMINE 1 MG/3DAYS TD PT72
MEDICATED_PATCH | TRANSDERMAL | Status: DC | PRN
  Administered 2019-05-20: 1 via TRANSDERMAL

## 2019-05-20 MED ORDER — DEXAMETHASONE SODIUM PHOSPHATE 10 MG/ML IJ SOLN
INTRAMUSCULAR | Status: DC | PRN
  Administered 2019-05-20: 10 mg via INTRAVENOUS

## 2019-05-20 MED ORDER — SCOPOLAMINE 1 MG/3DAYS TD PT72
MEDICATED_PATCH | TRANSDERMAL | Status: AC
  Filled 2019-05-20: qty 1

## 2019-05-20 MED ORDER — PROPOFOL 500 MG/50ML IV EMUL
INTRAVENOUS | Status: DC | PRN
  Administered 2019-05-20: 100 ug/kg/min via INTRAVENOUS

## 2019-05-20 MED ORDER — PROPOFOL 10 MG/ML IV BOLUS
INTRAVENOUS | Status: AC
  Filled 2019-05-20: qty 60

## 2019-05-20 MED ORDER — LACTATED RINGERS IV SOLN
INTRAVENOUS | Status: AC | PRN
  Administered 2019-05-20: 1000 mL via INTRAVENOUS

## 2019-05-20 SURGICAL SUPPLY — 15 items

## 2019-05-20 NOTE — Op Note (Signed)
[facility] Patient Name: [name] [name] Procedure Date: 05/20/2019 MRN: [id number] Attending MD: Juanita Craver , MD Date of Birth: May 22, 1971 CSN: [id number] Age: 48 Admit Type: Inpatient Procedure:                Colonoscopy with cold snare polypectomy x 2. Indications:              CRC screening for colorectal malignant neoplasm,                            RLQ pain. Providers:                Juanita Craver, MD, Cleda Daub, RN, Ladona Ridgel,                            Technician, Dellie Catholic, CRNA Referring MD:             [name]. Martinique, MD Medicines:                Monitored Anesthesia Care Complications:            No immediate complications. Estimated Blood Loss:     Estimated blood loss was minimal. Procedure:                Pre-Anesthesia Assessment: - Prior to the                            procedure, a history and physical was performed,                            and patient medications and allergies were                            reviewed. The patient's tolerance of previous                            anesthesia was also reviewed. The risks and                            benefits of the procedure and the sedation options                            and risks were discussed with the patient. All                            questions were answered, and informed consent was                            obtained. Prior Anticoagulants: The patient has                            taken no previous anticoagulant or antiplatelet                            agents. ASA Grade Assessment: II - A patient with  mild systemic disease. After reviewing the risks                            and benefits, the patient was deemed in                            satisfactory condition to undergo the procedure.                            After obtaining informed consent, the colonoscope                            was passed under direct vision. Throughout the                             procedure, the patient's blood pressure, pulse, and                            oxygen saturations were monitored continuously. The                            CF-HQ190L ([id number]) Olympus colonoscope was                            introduced through the anus and advanced to the the                            terminal ileum, with identification of the                            appendiceal orifice and IC valve. The colonoscopy                            was performed without difficulty. The patient                            tolerated the procedure well. The quality of the                            bowel preparation was adequate. The terminal ileum,                            the ileocecal valve, the appendiceal orifice and                            the rectum were photographed. The bowel preparation                            used was GoLYTELY via split dose instruction. Scope In: 7:39:13 AM Scope Out: 7:53:58 AM Scope Withdrawal Time: 0 hours 11 minutes 3 seconds  Total Procedure Duration: 0 hours 14 minutes 45 seconds  Findings:      A 7 mm sessile polyp was found in the proximal ascending colon; the  polyp was removed with a cold snare x 1; resection and retrieval were       complete.      A 6 mm sessile polyp was found in the mid-descending colon; the polyp       was removed with a cold snare x 1; resection and retrieval were complete.      The terminal ileum appeared normal.      Small internal hemorrhoids were noted on retroflexion. Impression:               - One 7 mm sessile polyp in the proximal ascending                            colon, removed with a cold snare x 1; resected and                            retrieved.                           - One 6 mm sessile polyp in the mid descending                            colon, removed with a cold snare x 1; resected and                            retrieved.                           - The examined  portion of the ileum was normal.                           - Small internal hemorrhoids. Moderate Sedation:      MAC used. Recommendation:           - High fiber, low fat diet with augmented water                            consumption daily.                           - Continue present medications; avoid all NSAIDS                            for 2 weeks.                           - Await pathology results.                           - Repeat colonoscopy in 5-7 years for surveillance.                           - Return to GI office in 2 weeks.                           - If the patient has any abnormal GI symptoms in  the interim, she has been advised to call the                            office ASAP for further recommendations. Procedure Code(s):        --- Professional ---                           [phone], Colonoscopy, flexible; with removal of                            tumor(s), polyp(s), or other lesion(s) by snare                            technique Diagnosis Code(s):        --- Professional ---                           D12.2, Benign neoplasm of ascending colon                           D12.4, Benign neoplasm of descending colon                           Z12.11, Encounter for screening for malignant                            neoplasm of colon                           R10.31, Right lower quadrant pain CPT copyright 2019 American Medical Association. All rights reserved. The codes documented in this report are preliminary and upon coder review may  be revised to meet current compliance requirements. Juanita Craver, MD Juanita Craver, MD 05/20/2019 8:21:32 AM This report has been signed electronically. Number of Addenda: 0

## 2019-05-20 NOTE — Discharge Instructions (Signed)
Colonoscopy, Adult, Care After This sheet gives you information about how to care for yourself after your procedure. Your doctor may also give you more specific instructions. If you have problems or questions, call your doctor. What can I expect after the procedure? After the procedure, it is common to have:  A small amount of blood in your poop for 24 hours.  Some gas.  Mild cramping or bloating in your belly. Follow these instructions at home: General instructions  For the first 24 hours after the procedure: ? Do not drive or use machinery. ? Do not sign important documents. ? Do not drink alcohol. ? Do your daily activities more slowly than normal. ? Eat foods that are soft and easy to digest.  Take over-the-counter or prescription medicines only as told by your doctor. To help cramping and bloating:   Try walking around.  Put heat on your belly (abdomen) as told by your doctor. Use a heat source that your doctor recommends, such as a moist heat pack or a heating pad. ? Put a towel between your skin and the heat source. ? Leave the heat on for 20-30 minutes. ? Remove the heat if your skin turns bright red. This is especially important if you cannot feel pain, heat, or cold. You can get burned. Eating and drinking   Drink enough fluid to keep your pee (urine) clear or pale yellow.  Return to your normal diet as told by your doctor. Avoid heavy or fried foods that are hard to digest.  Avoid drinking alcohol for as long as told by your doctor. Contact a doctor if:  You have blood in your poop (stool) 2-3 days after the procedure. Get help right away if:  You have more than a small amount of blood in your poop.  You see large clumps of tissue (blood clots) in your poop.  Your belly is swollen.  You feel sick to your stomach (nauseous).  You throw up (vomit).  You have a fever.  You have belly pain that gets worse, and medicine does not help your  pain. Summary  After the procedure, it is common to have a small amount of blood in your poop. You may also have mild cramping and bloating in your belly.  For the first 24 hours after the procedure, do not drive or use machinery, do not sign important documents, and do not drink alcohol.  Get help right away if you have a lot of blood in your poop, feel sick to your stomach, have a fever, or have more belly pain. This information is not intended to replace advice given to you by your health care provider. Make sure you discuss any questions you have with your health care provider. Document Released: 12/09/2010 Document Revised: 09/06/2017 Document Reviewed: 07/31/2016 Elsevier Patient Education  2020 [employer]. Upper Endoscopy, Adult, Care After This sheet gives you information about how to care for yourself after your procedure. Your health care provider may also give you more specific instructions. If you have problems or questions, contact your health care provider. What can I expect after the procedure? After the procedure, it is common to have:  A sore throat.  Mild stomach pain or discomfort.  Bloating.  Nausea. Follow these instructions at home:   Follow instructions from your health care provider about what to eat or drink after your procedure.  Return to your normal activities as told by your health care provider. Ask your health care provider what activities  are safe for you.  Take over-the-counter and prescription medicines only as told by your health care provider.  Do not drive for 24 hours if you were given a sedative during your procedure.  Keep all follow-up visits as told by your health care provider. This is important. Contact a health care provider if you have:  A sore throat that lasts longer than one day.  Trouble swallowing. Get help right away if:  You vomit blood or your vomit looks like coffee grounds.  You have: ? A fever. ? Bloody, black,  or tarry stools. ? A severe sore throat or you cannot swallow. ? Difficulty breathing. ? Severe pain in your chest or abdomen. Summary  After the procedure, it is common to have a sore throat, mild stomach discomfort, bloating, and nausea.  Do not drive for 24 hours if you were given a sedative during the procedure.  Follow instructions from your health care provider about what to eat or drink after your procedure.  Return to your normal activities as told by your health care provider. This information is not intended to replace advice given to you by your health care provider. Make sure you discuss any questions you have with your health care provider. Document Released: 05/07/2012 Document Revised: 04/30/2018 Document Reviewed: 04/08/2018 Elsevier Patient Education  2020 [employer].

## 2019-05-20 NOTE — Transfer of Care (Signed)
Immediate Anesthesia Transfer of Care Note  Patient: [name] [name]  Procedure(s) Performed: ESOPHAGOGASTRODUODENOSCOPY (EGD) WITH PROPOFOL (N/A ) COLONOSCOPY (N/A ) POLYPECTOMY BIOPSY  Patient Location: Endoscopy Unit  Anesthesia Type:MAC  Level of Consciousness: awake, alert , oriented and patient cooperative  Airway & Oxygen Therapy: Patient Spontanous Breathing and Patient connected to nasal cannula oxygen  Post-op Assessment: Report given to RN and Post -op Vital signs reviewed and stable  Post vital signs: Reviewed and stable  Last Vitals:  Vitals Value Taken Time  BP    Temp    Pulse    Resp    SpO2      Last Pain:  Vitals:   05/20/19 0655  TempSrc: Oral  PainSc: 0-No pain         Complications: No apparent anesthesia complications

## 2019-05-20 NOTE — H&P (Signed)
[name] [name] is an 48 y.o. female.   Chief Complaint: Difficulty swallowing. HPI: 48 year old white female here for an EGD to further evaluate her dysphagia. See office notes for details.  Past Medical History:  Diagnosis Date  . Anxiety   . Bipolar disorder ([place])   . Chronic kidney disease    kidney infections  . Depression   . Fibromyalgia   . GERD (gastroesophageal reflux disease)   . History of cervical dysplasia   . History of exercise intolerance    normal ETT 10-24-2016  . History of kidney stones   . Hyperlipidemia   . Hypertension   . Migraines   . Osteoarthritis   . PCOS (polycystic ovarian syndrome)   . Pneumonia 2018  . Rash    in area of eswl 04-23-2017  . Right ureteral calculus   . Sleep apnea   . Tachycardia cardiologist-  dr harding   controlled w/ metoprolol   Past Surgical History:  Procedure Laterality Date  . ANTERIOR CERVICAL DECOMP/DISCECTOMY FUSION N/A 09/01/2015   Procedure: ANTERIOR CERVICAL DECOMPRESSION/DISCECTOMY FUSION PLATING BONEGRAFT CERVICAL FIVE-SIX;  Surgeon: Kevan Ny Ditty, MD;  Location: [facility];  Service: Neurosurgery;  Laterality: N/A;  . BREAST EXCISIONAL BIOPSY Bilateral left 10/15/2002;   right 1997   left ductal system excision (papilloma w/ florid epithelial hyperplasia)/  right benign lumpectomy  . CARDIOVASCULAR STRESS TEST  04/27/2009   normal nuclear study w/ no ischemia/  normal LV function and wall motion , ef 66%  . CHOLECYSTECTOMY N/A 03/13/2013   Procedure: LAPAROSCOPIC CHOLECYSTECTOMY;  Surgeon: Harl Bowie, MD;  Location: [place] [facility];  Service: General;  Laterality: N/A;  . COLONOSCOPY  last one 11-08-2006  . CYSTOSCOPY N/A 05/08/2018   Procedure: CYSTOSCOPY;  Surgeon: Paula Compton, MD;  Location: [facility];  Service: Gynecology;  Laterality: N/A;  . CYSTOSCOPY WITH RETROGRADE PYELOGRAM, URETEROSCOPY AND STENT PLACEMENT Right 05/18/2017   Procedure: CYSTOSCOPY WITH RETROGRADE  PYELOGRAM, URETEROSCOPY AND STENT PLACEMENT;  Surgeon: Alexis Frock, MD;  Location: [facility];  Service: Urology;  Laterality: Right;  . DX LAPAROSCOPY W/ LYSIS ADHESIONS/  LASER VAPORIZATION OF CERVIX  06/25/2002  . ESOPHAGOGASTRODUODENOSCOPY  04/25/2005  . EXPLORATORY LEFT THUMB REPAIR TENDON/ LACERATION  09/27/1999  . EXTRACORPOREAL SHOCK WAVE LITHOTRIPSY Right 04/23/2017   Procedure: RIGHT EXTRACORPOREAL SHOCK WAVE LITHOTRIPSY (ESWL);  Surgeon: Alexis Frock, MD;  Location: [facility];  Service: Urology;  Laterality: Right;  . EXTRACORPOREAL SHOCK WAVE LITHOTRIPSY Right 04/23/2017  . HOLMIUM LASER APPLICATION Right 9/51/8841   Procedure: HOLMIUM LASER APPLICATION;  Surgeon: Alexis Frock, MD;  Location: [facility];  Service: Urology;  Laterality: Right;  . LAPAROSCOPIC VAGINAL HYSTERECTOMY WITH SALPINGECTOMY Bilateral 05/08/2018   Procedure: LAPAROSCOPIC ASSISTED VAGINAL HYSTERECTOMY WITH SALPINGECTOMY,  I & D Sebaceous Left Labia;  Surgeon: Paula Compton, MD;  Location: [facility];  Service: Gynecology;  Laterality: Bilateral;  . LAPAROSCOPY  07/30/2012   Procedure: LAPAROSCOPY DIAGNOSTIC;  Surgeon: Logan Bores, MD;  Location: [facility];  Service: Gynecology;  Laterality: N/A;  . WISDOM TOOTH EXTRACTION     Family History  Problem Relation Age of Onset  . Diabetes Father   . Hypertension Father   . Arthritis Father   . Depression Father   . Hearing loss Father   . Diabetes Mother   . Hypertension Mother   . Arthritis Mother   . Depression Mother   . Asthma Paternal Grandmother   . Kidney disease Paternal Grandmother   .  Arthritis Paternal Grandmother   . Cancer Paternal Grandmother   . COPD Paternal Grandmother   . Hearing loss Paternal Grandmother   . Heart disease Paternal Grandmother   . Hypertension Paternal Grandmother   . Hyperlipidemia Paternal Grandmother   . Stroke Paternal Grandmother   . Heart attack Paternal Grandmother   .  Depression Sister   . Emphysema Other   . Tongue cancer Other   . Coronary artery disease Other   . COPD Son   . Drug abuse Son   . Arthritis Maternal Grandmother   . COPD Maternal Grandmother   . Depression Maternal Grandmother   . Heart disease Maternal Grandmother   . Early death Maternal Grandfather   . Heart attack Maternal Grandfather   . COPD Paternal Grandfather   . Heart attack Paternal Grandfather   . Breast cancer Neg Hx    Social History:  reports that she has been smoking cigarettes. She has a 25.00 pack-year smoking history. She has never used smokeless tobacco. She reports that she does not drink alcohol or use drugs.  Allergies:  Allergies  Allergen Reactions  . Aspirin Shortness Of Breath and Other (See Comments)    Angiodema  . Nsaids Anaphylaxis    Swelling of eyes mouth and throat difficulty breathing  . Prednisone     Worsened depression, suicidal ideation   . Terbinafine And Related     Worsened depression, suicidal ideation  . Naproxen Swelling    Facial swelling   Medications Prior to Admission  Medication Sig Dispense Refill  . acetaminophen (TYLENOL) 500 MG tablet Take 1,000 mg by mouth every 6 (six) hours as needed for moderate pain.    . benztropine (COGENTIN) 1 MG tablet Take 1 mg by mouth 2 (two) times daily.    Marland Kitchen buPROPion (WELLBUTRIN SR) 150 MG 12 hr tablet Take 150 mg by mouth every evening.    . carbamazepine (TEGRETOL XR) 100 MG 12 hr tablet Take 100-200 mg by mouth See admin instructions. Take 100 mg in the morning and 200 mg at night    . cycloSPORINE (RESTASIS) 0.05 % ophthalmic emulsion Place 1 drop into both eyes 2 (two) times daily as needed (dry eyes).    . hydrOXYzine (ATARAX/VISTARIL) 10 MG tablet Take 10 mg by mouth 3 (three) times daily as needed for anxiety.     . lamoTRIgine (LAMICTAL) 100 MG tablet Take 100 mg by mouth 2 (two) times daily.    Marland Kitchen linaclotide (LINZESS) 145 MCG CAPS capsule Take 1 capsule (145 mcg total) by mouth  daily before breakfast. 90 capsule 1  . lurasidone (LATUDA) 20 MG TABS tablet Take 20 mg by mouth at bedtime.     Marland Kitchen omeprazole (PRILOSEC) 40 MG capsule Take 1 capsule (40 mg total) by mouth daily. (Patient taking differently: Take 40 mg by mouth every evening. ) 90 capsule 3  . pregabalin (LYRICA) 300 MG capsule TAKE 1 CAPSULE BY MOUTH TWICE DAILY (Patient taking differently: Take 300 mg by mouth 2 (two) times daily. ) 60 capsule 3  . traZODone (DESYREL) 50 MG tablet Take 50-100 mg by mouth at bedtime as needed for sleep.     . carvedilol (COREG) 12.5 MG tablet Take 1 tablet (12.5 mg total) by mouth 2 (two) times daily with a meal. (Patient not taking: Reported on 05/13/2019) 180 tablet 1  . doxycycline (VIBRA-TABS) 100 MG tablet Take 1 tablet (100 mg total) by mouth 2 (two) times daily for 14 days. 28 tablet 0  .  terbinafine (LAMISIL) 250 MG tablet Take 1 tablet (250 mg total) by mouth daily. (Patient not taking: Reported on 05/13/2019) 90 tablet 0    No results found for this or any previous visit (from the past 48 hour(s)). No results found.  Review of Systems  Eyes: Negative.   Respiratory: Negative.   Cardiovascular: Negative.   Gastrointestinal: Positive for abdominal pain, constipation and heartburn. Negative for blood in stool, diarrhea, melena, nausea and vomiting.  Genitourinary: Negative.   Musculoskeletal: Positive for back pain, joint pain and myalgias.  Neurological: Negative.   Endo/Heme/Allergies: Negative.   Psychiatric/Behavioral: Positive for depression and substance abuse. Negative for memory loss. The patient is nervous/anxious. The patient does not have insomnia.     Blood pressure 123/88, temperature 98.7 F (37.1 C), temperature source Oral, resp. rate 14, height 5\' 7"  (1.702 m), weight 105.2 kg, last menstrual period 04/30/2018, SpO2 95 %. Physical Exam  Constitutional: She is oriented to person, place, and time. She appears well-developed and well-nourished.   HENT:  Head: Normocephalic and atraumatic.  Eyes: Pupils are equal, round, and reactive to light. Conjunctivae and EOM are normal.  Neck: Normal range of motion. Neck supple.  Cardiovascular: Normal rate and regular rhythm.  Respiratory: Effort normal and breath sounds normal.  GI: Soft. Bowel sounds are normal. She exhibits no distension and no mass. There is no abdominal tenderness. There is no rebound and no guarding.  Musculoskeletal: Normal range of motion.  Neurological: She is alert and oriented to person, place, and time.  Skin: Skin is warm and dry.  Psychiatric: She has a normal mood and affect. Her behavior is normal. Judgment normal.    Assessment/Plan Dysphagia/GERD: proceed with an EGD at this time.  Juanita Craver, MD 05/20/2019, 7:17 AM

## 2019-05-20 NOTE — Anesthesia Postprocedure Evaluation (Signed)
Anesthesia Post Note  Patient: [name] [name]  Procedure(s) Performed: ESOPHAGOGASTRODUODENOSCOPY (EGD) WITH PROPOFOL (N/A ) COLONOSCOPY (N/A ) POLYPECTOMY BIOPSY     Patient location during evaluation: PACU Anesthesia Type: MAC Level of consciousness: awake and alert Pain management: pain level controlled Vital Signs Assessment: post-procedure vital signs reviewed and stable Respiratory status: spontaneous breathing Cardiovascular status: stable Anesthetic complications: no    Last Vitals:  Vitals:   05/20/19 0802 05/20/19 0815  BP: 113/72 128/77  Pulse: 77 75  Resp: 19 18  Temp: 36.6 C 36.7 C  SpO2: 97% 96%    Last Pain:  Vitals:   05/20/19 0815  TempSrc:   PainSc: 0-No pain                 Nolon Nations

## 2019-05-20 NOTE — Op Note (Signed)
[facility] Patient Name: [name] [name] Procedure Date: 05/20/2019 MRN: [id number] Attending MD: Juanita Craver , MD Date of Birth: 06/19/1971 CSN: [id number] Age: 48 Admit Type: Inpatient Procedure:                EGD with biopsies. Indications:              Dysphagia, Gastro-esophageal reflux disease. Providers:                Juanita Craver, MD, Cleda Daub, RN, Ladona Ridgel,                            Technician, Dellie Catholic, CRNA Referring MD:             [name]. Martinique, MD Medicines:                Monitored Anesthesia Care Complications:            No immediate complications. Estimated Blood Loss:     Estimated blood loss was minimal. Procedure:                Pre-Anesthesia Assessment: - Prior to the                            procedure, a history and physical was performed,                            and patient medications and allergies were                            reviewed. The patient's tolerance of previous                            anesthesia was also reviewed. The risks and                            benefits of the procedure and the sedation options                            and risks were discussed with the patient. All                            questions were answered, and informed consent was                            obtained. Prior Anticoagulants: The patient has                            taken no previous anticoagulant or antiplatelet                            agents. ASA Grade Assessment: II - A patient with                            mild systemic disease. After reviewing the risks  and benefits, the patient was deemed in                            satisfactory condition to undergo the procedure.                            After obtaining informed consent, the endoscope was                            passed under direct vision. Throughout the                            procedure, the patient's blood pressure,  pulse, and                            oxygen saturations were monitored continuously. The                            GIF-H190 ([id number]) Olympus gastroscope was                            introduced through the mouth, and advanced to the                            second part of duodenum. The EGD was accomplished                            without difficulty. The patient tolerated the                            procedure well. Scope In: Scope Out: Findings:      The examined esophagus and GEJ appeared widely patent and normal.      A small single sessile polyp with stigmata of recent bleeding was found       in the gastric fundus-there was some old heme around it-it was removed       by cold biopsies.      The cardia and gastric fundus were normal on retroflexion.      The examined duodenum was normal. Impression:               - Normal appearing, widely patent esophagus and GEJ.                           - A single sessile 6 mm gastric polyp in the                            fundus-removed by cold biopsies.                           - Normal examined duodenum. Moderate Sedation:      MAC used.. Recommendation:           - High fiber, low fat diet with augmented water  consumption daily.                           - Continue present medications.                           - Await pathology results.                           - Return to my office in 2 weeks.                           - If the patient has any abnormal GI symptoms in                            the interim, she has been advised to call the                            office ASAP for further recommendations. Procedure Code(s):        --- Professional ---                           [phone], Esophagogastroduodenoscopy, flexible,                            transoral; with biopsy, single or multiple Diagnosis Code(s):        --- Professional ---                           R13.10, Dysphagia, unspecified                            K21.9, Gastro-esophageal reflux disease without                            esophagitis                           K31.7, Polyp of stomach and duodenum CPT copyright 2019 [employer]. All rights reserved. The codes documented in this report are preliminary and upon coder review may  be revised to meet current compliance requirements. Juanita Craver, MD Juanita Craver, MD 05/20/2019 8:11:15 AM This report has been signed electronically. Number of Addenda: 0

## 2019-05-20 NOTE — Anesthesia Procedure Notes (Signed)
Procedure Name: MAC Date/Time: 05/20/2019 7:27 AM Performed by: Claudia Desanctis, CRNA Oxygen Delivery Method: Nasal cannula

## 2019-05-20 NOTE — Anesthesia Preprocedure Evaluation (Signed)
Anesthesia Evaluation  Patient identified by MRN, date of birth, ID band Patient awake    Reviewed: Allergy & Precautions, NPO status , Patient's Chart, lab work & pertinent test results, reviewed documented beta blocker date and time   Airway Mallampati: II  TM Distance: >3 FB Neck ROM: Full    Dental  (+) Teeth Intact, Caps   Pulmonary sleep apnea , pneumonia, resolved, Current Smoker,    Pulmonary exam normal breath sounds clear to auscultation       Cardiovascular hypertension, Pt. on home beta blockers and Pt. on medications Normal cardiovascular exam Rhythm:Regular Rate:Normal     Neuro/Psych  Headaches, PSYCHIATRIC DISORDERS Anxiety Depression Bipolar Disorder  Neuromuscular disease    GI/Hepatic GERD  Medicated and Controlled,(+)     substance abuse  cocaine use, Hx/o cocaine abuse   Endo/Other  Hypothyroidism Obesity  Renal/GU Renal diseaseHx/o renal calculi     Musculoskeletal  (+) Arthritis , Osteoarthritis,  Fibromyalgia -  Abdominal (+) + obese,   Peds  Hematology   Anesthesia Other Findings   Reproductive/Obstetrics Menorrhagia  Fibroid uterus                             Anesthesia Physical  Anesthesia Plan  ASA: II  Anesthesia Plan: MAC   Post-op Pain Management:    Induction: Intravenous  PONV Risk Score and Plan: 4 or greater and Scopolamine patch - Pre-op, Midazolam, Dexamethasone, Ondansetron and Treatment may vary due to age or medical condition  Airway Management Planned: Natural Airway  Additional Equipment:   Intra-op Plan:   Post-operative Plan:   Informed Consent: I have reviewed the patients History and Physical, chart, labs and discussed the procedure including the risks, benefits and alternatives for the proposed anesthesia with the patient or authorized representative who has indicated his/her understanding and acceptance.     Dental  advisory given  Plan Discussed with: CRNA and Surgeon  Anesthesia Plan Comments:         Anesthesia Quick Evaluation

## 2019-05-21 ENCOUNTER — Encounter (HOSPITAL_COMMUNITY): Payer: Self-pay | Admitting: Gastroenterology

## 2019-05-21 DIAGNOSIS — R69 Illness, unspecified: Secondary | ICD-10-CM | POA: Diagnosis not present

## 2019-05-21 DIAGNOSIS — G4733 Obstructive sleep apnea (adult) (pediatric): Secondary | ICD-10-CM | POA: Diagnosis not present

## 2019-05-29 DIAGNOSIS — M5416 Radiculopathy, lumbar region: Secondary | ICD-10-CM | POA: Diagnosis not present

## 2019-05-29 DIAGNOSIS — M5126 Other intervertebral disc displacement, lumbar region: Secondary | ICD-10-CM | POA: Diagnosis not present

## 2019-05-30 DIAGNOSIS — R69 Illness, unspecified: Secondary | ICD-10-CM | POA: Diagnosis not present

## 2019-06-02 ENCOUNTER — Encounter: Payer: Self-pay | Admitting: Family Medicine

## 2019-06-02 ENCOUNTER — Other Ambulatory Visit: Payer: Self-pay

## 2019-06-02 ENCOUNTER — Ambulatory Visit: Payer: Self-pay | Admitting: Family Medicine

## 2019-06-02 ENCOUNTER — Ambulatory Visit (INDEPENDENT_AMBULATORY_CARE_PROVIDER_SITE_OTHER): Payer: Medicare HMO | Admitting: Family Medicine

## 2019-06-02 DIAGNOSIS — R0989 Other specified symptoms and signs involving the circulatory and respiratory systems: Secondary | ICD-10-CM

## 2019-06-02 DIAGNOSIS — M5442 Lumbago with sciatica, left side: Secondary | ICD-10-CM

## 2019-06-02 DIAGNOSIS — R05 Cough: Secondary | ICD-10-CM

## 2019-06-02 DIAGNOSIS — R000 Tachycardia, unspecified: Secondary | ICD-10-CM

## 2019-06-02 DIAGNOSIS — G8929 Other chronic pain: Secondary | ICD-10-CM

## 2019-06-02 DIAGNOSIS — I10 Essential (primary) hypertension: Secondary | ICD-10-CM

## 2019-06-02 DIAGNOSIS — J989 Respiratory disorder, unspecified: Secondary | ICD-10-CM

## 2019-06-02 DIAGNOSIS — R059 Cough, unspecified: Secondary | ICD-10-CM

## 2019-06-02 DIAGNOSIS — M5441 Lumbago with sciatica, right side: Secondary | ICD-10-CM | POA: Diagnosis not present

## 2019-06-02 MED ORDER — ALBUTEROL SULFATE HFA 108 (90 BASE) MCG/ACT IN AERS: 2.0000 | INHALATION_SPRAY | Freq: Four times a day (QID) | RESPIRATORY_TRACT | 1 refills | Status: DC | PRN

## 2019-06-02 MED ORDER — PROPRANOLOL HCL 40 MG PO TABS: 40.0000 mg | ORAL_TABLET | Freq: Two times a day (BID) | ORAL | 2 refills | Status: DC

## 2019-06-02 MED ORDER — AEROCHAMBER PLUS MISC: 1 refills | Status: DC

## 2019-06-02 MED ORDER — FLOVENT HFA 110 MCG/ACT IN AERO: 2.0000 | INHALATION_SPRAY | Freq: Two times a day (BID) | RESPIRATORY_TRACT | 2 refills | Status: DC

## 2019-06-02 NOTE — Assessment & Plan Note (Signed)
Propranolol 40 mg twice daily added today. Some side effect discussed. Instructed about warning signs. Keep next appointment.

## 2019-06-02 NOTE — Assessment & Plan Note (Signed)
Poorly controlled. We discussed possible complications of elevated BP. Given her history of anxiety and palpitations, I think propranolol is a good option for her, 40 mg twice daily. Continue monitoring BP regularly. Low-salt diet. She has an appointment next month.

## 2019-06-02 NOTE — Progress Notes (Signed)
Virtual Visit via Video Note   I connected with [name] [name] on 06/03/19 at  9:00 AM EDT by a video enabled telemedicine application and verified that I am speaking with the correct person using two identifiers.  Location patient: home Location provider:work office Persons participating in the virtual visit: patient, provider  I discussed the limitations of evaluation and management by telemedicine and the availability of in person appointments. The patient expressed understanding and agreed to proceed.   HPI: [name]. [name] [name] is a 48 years old female with history of bipolar disorder, depression, anxiety, and chronic pain who is complaining about persistent cough. 4 to 5 months of mostly nonproductive cough. Occasionally she has "little" of sputum that she cannot cough up, + postnasal drainage. She denies hemoptysis. + Smoker. "Rumbling" in her chest, "barking" cough. Cough seems to be exacerbated by moderate physical activity, associated with wheezing. No history of asthma.  She denies associated dyspnea.  Denies fever, chills, night sweats, unusual fatigue, sore throat, or abnormal weight loss. History of OSA, she recently cleaned her CPAP and wearing it nightly.  Negative for abdominal pain, nausea, vomiting, or heartburn. History of GERD, currently she is on omeprazole 40 mg daily.  Since her last visit she has followed with her psychiatrist, medications were adjusted.  Currently she is on Abilify and Lexapro.  Hypertension: She is not taking carvedilol, she does not recall why medication was stopped. Recently she had an episode of palpitation, so she checked BP, it was elevated at165/90 . She has had intermittent episodes palpitation for a while, history of sinus tachycardia.  She denies unusual headache, visual changes, chest pain, dyspnea, diaphoresis, gross hematuria, decreased urine output, focal weakness, or edema.  Lab Results  Component Value Date   CREATININE 0.82 01/15/2019    BUN 16 01/15/2019   NA 139 01/15/2019   K 4.4 01/15/2019   CL 103 01/15/2019   CO2 27 01/15/2019    Chronic back pain with radiculopathy, on 06/18/19 she is scheduled for epidural injection (per patient report). She is following with Dr Saintclair Halsted neurosurgereon.  Fibromyalgia: Currently she is on Lyrica 300 mg twice daily, which really helps with pain. For the past couple weeks pain seems to be worse,mainly back pain. Now 5/10. Night time had early morning are worse , 8/10 and am 9/10.    ROS: See pertinent positives and negatives per HPI.  Past Medical History:  Diagnosis Date  . Anxiety   . Bipolar disorder ([place])   . Chronic kidney disease    kidney infections  . Depression   . Fibromyalgia   . GERD (gastroesophageal reflux disease)   . History of cervical dysplasia   . History of exercise intolerance    normal ETT 10-24-2016  . History of kidney stones   . Hyperlipidemia   . Hypertension   . Migraines   . Osteoarthritis   . PCOS (polycystic ovarian syndrome)   . Pneumonia 2018  . Rash    in area of eswl 04-23-2017  . Right ureteral calculus   . Sleep apnea   . Tachycardia cardiologist-  dr harding   controlled w/ metoprolol    Past Surgical History:  Procedure Laterality Date  . ANTERIOR CERVICAL DECOMP/DISCECTOMY FUSION N/A 09/01/2015   Procedure: ANTERIOR CERVICAL DECOMPRESSION/DISCECTOMY FUSION PLATING BONEGRAFT CERVICAL FIVE-SIX;  Surgeon: Kevan Ny Ditty, MD;  Location: [facility];  Service: Neurosurgery;  Laterality: N/A;  . BIOPSY  05/20/2019   Procedure: BIOPSY;  Surgeon: Juanita Craver, MD;  Location:  [facility];  Service: Endoscopy;;  . BREAST EXCISIONAL BIOPSY Bilateral left 10/15/2002;   right 1997   left ductal system excision (papilloma w/ florid epithelial hyperplasia)/  right benign lumpectomy  . CARDIOVASCULAR STRESS TEST  04/27/2009   normal nuclear study w/ no ischemia/  normal LV function and wall motion , ef 66%  . CHOLECYSTECTOMY N/A  03/13/2013   Procedure: LAPAROSCOPIC CHOLECYSTECTOMY;  Surgeon: Harl Bowie, MD;  Location: [facility];  Service: General;  Laterality: N/A;  . COLONOSCOPY  last one 11-08-2006  . COLONOSCOPY N/A 05/20/2019   Procedure: COLONOSCOPY;  Surgeon: Juanita Craver, MD;  Location: [facility];  Service: Endoscopy;  Laterality: N/A;  . CYSTOSCOPY N/A 05/08/2018   Procedure: CYSTOSCOPY;  Surgeon: Paula Compton, MD;  Location: [facility];  Service: Gynecology;  Laterality: N/A;  . CYSTOSCOPY WITH RETROGRADE PYELOGRAM, URETEROSCOPY AND STENT PLACEMENT Right 05/18/2017   Procedure: CYSTOSCOPY WITH RETROGRADE PYELOGRAM, URETEROSCOPY AND STENT PLACEMENT;  Surgeon: Alexis Frock, MD;  Location: [facility];  Service: Urology;  Laterality: Right;  . DX LAPAROSCOPY W/ LYSIS ADHESIONS/  LASER VAPORIZATION OF CERVIX  06/25/2002  . ESOPHAGOGASTRODUODENOSCOPY  04/25/2005  . ESOPHAGOGASTRODUODENOSCOPY (EGD) WITH PROPOFOL N/A 05/20/2019   Procedure: ESOPHAGOGASTRODUODENOSCOPY (EGD) WITH PROPOFOL;  Surgeon: Juanita Craver, MD;  Location: [facility];  Service: Endoscopy;  Laterality: N/A;  . EXPLORATORY LEFT THUMB REPAIR TENDON/ LACERATION  09/27/1999  . EXTRACORPOREAL SHOCK WAVE LITHOTRIPSY Right 04/23/2017   Procedure: RIGHT EXTRACORPOREAL SHOCK WAVE LITHOTRIPSY (ESWL);  Surgeon: Alexis Frock, MD;  Location: [facility];  Service: Urology;  Laterality: Right;  . EXTRACORPOREAL SHOCK WAVE LITHOTRIPSY Right 04/23/2017  . HOLMIUM LASER APPLICATION Right 12/20/8655   Procedure: HOLMIUM LASER APPLICATION;  Surgeon: Alexis Frock, MD;  Location: [facility];  Service: Urology;  Laterality: Right;  . LAPAROSCOPIC VAGINAL HYSTERECTOMY WITH SALPINGECTOMY Bilateral 05/08/2018   Procedure: LAPAROSCOPIC ASSISTED VAGINAL HYSTERECTOMY WITH SALPINGECTOMY,  I & D Sebaceous Left Labia;  Surgeon: Paula Compton, MD;  Location: [facility];  Service: Gynecology;  Laterality: Bilateral;  .  LAPAROSCOPY  07/30/2012   Procedure: LAPAROSCOPY DIAGNOSTIC;  Surgeon: Logan Bores, MD;  Location: [facility];  Service: Gynecology;  Laterality: N/A;  . POLYPECTOMY  05/20/2019   Procedure: POLYPECTOMY;  Surgeon: Juanita Craver, MD;  Location: [facility];  Service: Endoscopy;;  . WISDOM TOOTH EXTRACTION      Family History  Problem Relation Age of Onset  . Diabetes Father   . Hypertension Father   . Arthritis Father   . Depression Father   . Hearing loss Father   . Diabetes Mother   . Hypertension Mother   . Arthritis Mother   . Depression Mother   . Asthma Paternal Grandmother   . Kidney disease Paternal Grandmother   . Arthritis Paternal Grandmother   . Cancer Paternal Grandmother   . COPD Paternal Grandmother   . Hearing loss Paternal Grandmother   . Heart disease Paternal Grandmother   . Hypertension Paternal Grandmother   . Hyperlipidemia Paternal Grandmother   . Stroke Paternal Grandmother   . Heart attack Paternal Grandmother   . Depression Sister   . Emphysema Other   . Tongue cancer Other   . Coronary artery disease Other   . COPD Son   . Drug abuse Son   . Arthritis Maternal Grandmother   . COPD Maternal Grandmother   . Depression Maternal Grandmother   . Heart disease Maternal Grandmother   . Early death Maternal Grandfather   .  Heart attack Maternal Grandfather   . COPD Paternal Grandfather   . Heart attack Paternal Grandfather   . Breast cancer Neg Hx     Social History   Socioeconomic History  . Marital status: Legally Separated    Spouse name: Not on file  . Number of children: 2  . Years of education: Not on file  . Highest education level: Not on file  Occupational History  . Occupation: Unemployed  Social Needs  . Financial resource strain: Not on file  . Food insecurity    Worry: Not on file    Inability: Not on file  . Transportation needs    Medical: Not on file    Non-medical: Not on file  Tobacco Use  . Smoking status: Current  Some Day Smoker    Packs/day: 1.00    Years: 25.00    Pack years: 25.00    Types: Cigarettes  . Smokeless tobacco: Never Used  . Tobacco comment: 6-7 cig. daily  Substance and Sexual Activity  . Alcohol use: No  . Drug use: No    Types: Cocaine    Comment: last cocaine use 2014  . Sexual activity: Not Currently    Partners: Male    Birth control/protection: None  Lifestyle  . Physical activity    Days per week: Not on file    Minutes per session: Not on file  . Stress: Not on file  Relationships  . Social Herbalist on phone: Not on file    Gets together: Not on file    Attends religious service: Not on file    Active member of club or organization: Not on file    Attends meetings of clubs or organizations: Not on file    Relationship status: Not on file  . Intimate partner violence    Fear of current or ex partner: Not on file    Emotionally abused: Not on file    Physically abused: Not on file    Forced sexual activity: Not on file  Other Topics Concern  . Not on file  Social History Narrative   She lives at home with her spouse. She does not exercise.   She currently denies recent recreational drug use.     Current Outpatient Medications:  .  acetaminophen (TYLENOL) 500 MG tablet, Take 1,000 mg by mouth every 6 (six) hours as needed for moderate pain., Disp: , Rfl:  .  ARIPiprazole (ABILIFY) 2 MG tablet, Take 2 mg by mouth daily., Disp: , Rfl:  .  benztropine (COGENTIN) 1 MG tablet, Take 1 mg by mouth 2 (two) times daily., Disp: , Rfl:  .  buPROPion (WELLBUTRIN SR) 150 MG 12 hr tablet, Take 150 mg by mouth every evening., Disp: , Rfl:  .  carbamazepine (TEGRETOL XR) 100 MG 12 hr tablet, Take 100-200 mg by mouth See admin instructions. Take 100 mg in the morning and 200 mg at night, Disp: , Rfl:  .  cycloSPORINE (RESTASIS) 0.05 % ophthalmic emulsion, Place 1 drop into both eyes 2 (two) times daily as needed (dry eyes)., Disp: , Rfl:  .  escitalopram  (LEXAPRO) 10 MG tablet, Take 10 mg by mouth at bedtime., Disp: , Rfl:  .  hydrOXYzine (ATARAX/VISTARIL) 10 MG tablet, Take 10 mg by mouth 3 (three) times daily as needed for anxiety. , Disp: , Rfl:  .  lamoTRIgine (LAMICTAL) 100 MG tablet, Take 100 mg by mouth 2 (two) times daily., Disp: , Rfl:  .  linaclotide (LINZESS) 145 MCG CAPS capsule, Take 1 capsule (145 mcg total) by mouth daily before breakfast., Disp: 90 capsule, Rfl: 1 .  lurasidone (LATUDA) 20 MG TABS tablet, Take 20 mg by mouth at bedtime. , Disp: , Rfl:  .  omeprazole (PRILOSEC) 40 MG capsule, Take 1 capsule (40 mg total) by mouth daily. (Patient taking differently: Take 40 mg by mouth every evening. ), Disp: 90 capsule, Rfl: 3 .  pregabalin (LYRICA) 300 MG capsule, TAKE 1 CAPSULE BY MOUTH TWICE DAILY (Patient taking differently: Take 300 mg by mouth 2 (two) times daily. ), Disp: 60 capsule, Rfl: 3 .  traZODone (DESYREL) 50 MG tablet, Take 50-100 mg by mouth at bedtime as needed for sleep. , Disp: , Rfl:  .  albuterol (VENTOLIN HFA) 108 (90 Base) MCG/ACT inhaler, Inhale 2 puffs into the lungs every 6 (six) hours as needed for wheezing or shortness of breath., Disp: 18 g, Rfl: 1 .  fluticasone (FLOVENT HFA) 110 MCG/ACT inhaler, Inhale 2 puffs into the lungs 2 (two) times a day., Disp: 1 Inhaler, Rfl: 2 .  propranolol (INDERAL) 40 MG tablet, Take 1 tablet (40 mg total) by mouth 2 (two) times daily., Disp: 60 tablet, Rfl: 2 .  Spacer/Aero-Holding Chambers (AEROCHAMBER PLUS) inhaler, Use as instructed to use with inahaler., Disp: 1 each, Rfl: 1 .  terbinafine (LAMISIL) 250 MG tablet, Take 1 tablet (250 mg total) by mouth daily. (Patient not taking: Reported on 05/13/2019), Disp: 90 tablet, Rfl: 0  EXAM:  VITALS per patient if applicable:N/A  GENERAL: alert, oriented, appears well and in no acute distress  HEENT: atraumatic, conjunctiva clear, no obvious facial abnormalities on inspection.  NECK: normal movements of the head and  neck  LUNGS: on inspection no signs of respiratory distress, breathing rate appears normal, no obvious gross SOB, gasping or wheezing  CV: no obvious cyanosis  PSYCH/NEURO: pleasant and cooperative, no obvious depression, + anxious.  Speech and thought processing grossly intact  ASSESSMENT AND PLAN:  Discussed the following assessment and plan:  Cough - Plan:  We discussed possible etiologies, including GERD, allergies, asthma, and COPD (given her history of smoking).  Reactive airway disease that is not asthma - Plan:?  COPD. She agrees with trying Flovent 111 mcg 2 puffs twice daily and albuterol 2 puff every 6 hours for a week and then as needed. Recommend using a spacer and rinsing her mouth after Flovent use. Keep next appointment. Smoking cessation strongly recommended.  Fluticasone (FLOVENT HFA) 110 MCG/ACT inhaler, albuterol (VENTOLIN HFA) 108 (90 Base) MCG/ACT inhaler, Spacer/Aero-Holding Chambers (AEROCHAMBER PLUS) inhaler.  Chronic right-sided low back pain with bilateral sciatica - Plan: Reporting worsening pain. Keep appointment with neurosurgeon. Instructed about warning signs.  HTN (hypertension) Poorly controlled. We discussed possible complications of elevated BP. Given her history of anxiety and palpitations, I think propranolol is a good option for her, 40 mg twice daily. Continue monitoring BP regularly. Low-salt diet. She has an appointment next month.  Sinus tachycardia Propranolol 40 mg twice daily added today. Some side effect discussed. Instructed about warning signs. Keep next appointment.   I discussed the assessment and treatment plan with the patient. She was provided an opportunity to ask questions and all were answered. She agreed with the plan and demonstrated an understanding of the instructions.     Return if symptoms worsen or fail to improve, for Has appt 07/07/19.    Betty Martinique, MD

## 2019-06-12 DIAGNOSIS — R69 Illness, unspecified: Secondary | ICD-10-CM | POA: Diagnosis not present

## 2019-06-16 DIAGNOSIS — R69 Illness, unspecified: Secondary | ICD-10-CM | POA: Diagnosis not present

## 2019-06-18 DIAGNOSIS — M5126 Other intervertebral disc displacement, lumbar region: Secondary | ICD-10-CM | POA: Diagnosis not present

## 2019-06-18 DIAGNOSIS — M5416 Radiculopathy, lumbar region: Secondary | ICD-10-CM | POA: Diagnosis not present

## 2019-07-01 ENCOUNTER — Other Ambulatory Visit: Payer: Self-pay | Admitting: Family Medicine

## 2019-07-07 ENCOUNTER — Encounter: Payer: Medicare HMO | Admitting: Family Medicine

## 2019-07-09 DIAGNOSIS — R69 Illness, unspecified: Secondary | ICD-10-CM | POA: Diagnosis not present

## 2019-07-17 DIAGNOSIS — R69 Illness, unspecified: Secondary | ICD-10-CM | POA: Diagnosis not present

## 2019-07-22 DIAGNOSIS — R69 Illness, unspecified: Secondary | ICD-10-CM | POA: Diagnosis not present

## 2019-08-04 ENCOUNTER — Encounter: Payer: Medicare HMO | Admitting: Family Medicine

## 2019-08-08 DIAGNOSIS — R69 Illness, unspecified: Secondary | ICD-10-CM | POA: Diagnosis not present

## 2019-08-11 ENCOUNTER — Other Ambulatory Visit: Payer: Self-pay | Admitting: Family Medicine

## 2019-08-11 DIAGNOSIS — M797 Fibromyalgia: Secondary | ICD-10-CM

## 2019-08-19 DIAGNOSIS — R69 Illness, unspecified: Secondary | ICD-10-CM | POA: Diagnosis not present

## 2019-08-26 ENCOUNTER — Encounter: Payer: Medicare HMO | Admitting: Family Medicine

## 2019-08-27 ENCOUNTER — Other Ambulatory Visit: Payer: Self-pay

## 2019-08-27 ENCOUNTER — Telehealth (INDEPENDENT_AMBULATORY_CARE_PROVIDER_SITE_OTHER): Payer: Medicare HMO | Admitting: Family Medicine

## 2019-08-27 DIAGNOSIS — R05 Cough: Secondary | ICD-10-CM | POA: Diagnosis not present

## 2019-08-27 DIAGNOSIS — R490 Dysphonia: Secondary | ICD-10-CM

## 2019-08-27 DIAGNOSIS — J989 Respiratory disorder, unspecified: Secondary | ICD-10-CM

## 2019-08-27 DIAGNOSIS — K219 Gastro-esophageal reflux disease without esophagitis: Secondary | ICD-10-CM | POA: Diagnosis not present

## 2019-08-27 DIAGNOSIS — R0989 Other specified symptoms and signs involving the circulatory and respiratory systems: Secondary | ICD-10-CM | POA: Diagnosis not present

## 2019-08-27 DIAGNOSIS — R059 Cough, unspecified: Secondary | ICD-10-CM

## 2019-08-27 MED ORDER — FLUTICASONE-SALMETEROL 250-50 MCG/DOSE IN AEPB: 1.0000 | INHALATION_SPRAY | Freq: Two times a day (BID) | RESPIRATORY_TRACT | 3 refills | Status: DC

## 2019-08-27 MED ORDER — FAMOTIDINE 40 MG PO TABS: 40.0000 mg | ORAL_TABLET | Freq: Every day | ORAL | 2 refills | Status: DC

## 2019-08-27 NOTE — Progress Notes (Signed)
Virtual Visit via Video Note   I connected with [name] on 08/27/19 by a video enabled telemedicine application and verified that I am speaking with the correct person using two identifiers.  Location patient: home Location provider:work office Persons participating in the virtual visit: patient, provider  I discussed the limitations of evaluation and management by telemedicine and the availability of in person appointments. The patient expressed understanding and agreed to proceed.   HPI: [name] is a 48 yo female with Hx of bipolar disorder,depression,OSA,and tobacco use c/o persistent cough.  She has been coughing for about 7 months. On 06/02/19 we discussed same concern, Flovent 110 mcg was recommended, she ran out a few weeks ago. States that Flovent inh helped some. Albuterol inh as needed also seems to help with acute symptoms. Cough is not productive. She has not identified a specific exacerbating factors but frequently gets worse at night when she is in bed. Not identified alleviating factors.  Associated wheezing and difficulty taking a deep breath. Still smoking. She denies fever, chills, unusual fatigue or body aches, sore throat, changes in the smell or taste, chest pain, or diaphoresis.  She has cough spells that cause nausea. No sick contact.  Hx of GERD,she is on Omeprazole 40 mg daily. She denies having heartburn or burping. + Dysphonia, exacerbated with prolonged talking.  Negative for stridor or dysphagia.  Negative for abdominal pain, vomiting, changes in bowel habits, or melena.   ROS: See pertinent positives and negatives per HPI.  Past Medical History:  Diagnosis Date  . Anxiety   . Bipolar disorder ([place])   . Chronic kidney disease    kidney infections  . Depression   . Fibromyalgia   . GERD (gastroesophageal reflux disease)   . History of cervical dysplasia   . History of exercise intolerance    normal ETT 10-24-2016  . History of kidney  stones   . Hyperlipidemia   . Hypertension   . Migraines   . Osteoarthritis   . PCOS (polycystic ovarian syndrome)   . Pneumonia 2018  . Rash    in area of eswl 04-23-2017  . Right ureteral calculus   . Sleep apnea   . Tachycardia cardiologist-  dr harding   controlled w/ metoprolol    Past Surgical History:  Procedure Laterality Date  . ANTERIOR CERVICAL DECOMP/DISCECTOMY FUSION N/A 09/01/2015   Procedure: ANTERIOR CERVICAL DECOMPRESSION/DISCECTOMY FUSION PLATING BONEGRAFT CERVICAL FIVE-SIX;  Surgeon: Kevan Ny Ditty, MD;  Location: [facility];  Service: Neurosurgery;  Laterality: N/A;  . BIOPSY  05/20/2019   Procedure: BIOPSY;  Surgeon: Juanita Craver, MD;  Location: [facility];  Service: Endoscopy;;  . BREAST EXCISIONAL BIOPSY Bilateral left 10/15/2002;   right 1997   left ductal system excision (papilloma w/ florid epithelial hyperplasia)/  right benign lumpectomy  . CARDIOVASCULAR STRESS TEST  04/27/2009   normal nuclear study w/ no ischemia/  normal LV function and wall motion , ef 66%  . CHOLECYSTECTOMY N/A 03/13/2013   Procedure: LAPAROSCOPIC CHOLECYSTECTOMY;  Surgeon: Harl Bowie, MD;  Location: [facility];  Service: General;  Laterality: N/A;  . COLONOSCOPY  last one 11-08-2006  . COLONOSCOPY N/A 05/20/2019   Procedure: COLONOSCOPY;  Surgeon: Juanita Craver, MD;  Location: [facility];  Service: Endoscopy;  Laterality: N/A;  . CYSTOSCOPY N/A 05/08/2018   Procedure: CYSTOSCOPY;  Surgeon: Paula Compton, MD;  Location: [facility];  Service: Gynecology;  Laterality: N/A;  . CYSTOSCOPY WITH RETROGRADE PYELOGRAM, URETEROSCOPY AND STENT PLACEMENT  Right 05/18/2017   Procedure: CYSTOSCOPY WITH RETROGRADE PYELOGRAM, URETEROSCOPY AND STENT PLACEMENT;  Surgeon: Alexis Frock, MD;  Location: [facility];  Service: Urology;  Laterality: Right;  . DX LAPAROSCOPY W/ LYSIS ADHESIONS/  LASER VAPORIZATION OF CERVIX  06/25/2002  .  ESOPHAGOGASTRODUODENOSCOPY  04/25/2005  . ESOPHAGOGASTRODUODENOSCOPY (EGD) WITH PROPOFOL N/A 05/20/2019   Procedure: ESOPHAGOGASTRODUODENOSCOPY (EGD) WITH PROPOFOL;  Surgeon: Juanita Craver, MD;  Location: [facility];  Service: Endoscopy;  Laterality: N/A;  . EXPLORATORY LEFT THUMB REPAIR TENDON/ LACERATION  09/27/1999  . EXTRACORPOREAL SHOCK WAVE LITHOTRIPSY Right 04/23/2017   Procedure: RIGHT EXTRACORPOREAL SHOCK WAVE LITHOTRIPSY (ESWL);  Surgeon: Alexis Frock, MD;  Location: [facility];  Service: Urology;  Laterality: Right;  . EXTRACORPOREAL SHOCK WAVE LITHOTRIPSY Right 04/23/2017  . HOLMIUM LASER APPLICATION Right [id number]   Procedure: HOLMIUM LASER APPLICATION;  Surgeon: Alexis Frock, MD;  Location: [facility];  Service: Urology;  Laterality: Right;  . LAPAROSCOPIC VAGINAL HYSTERECTOMY WITH SALPINGECTOMY Bilateral 05/08/2018   Procedure: LAPAROSCOPIC ASSISTED VAGINAL HYSTERECTOMY WITH SALPINGECTOMY,  I & D Sebaceous Left Labia;  Surgeon: Paula Compton, MD;  Location: [facility];  Service: Gynecology;  Laterality: Bilateral;  . LAPAROSCOPY  07/30/2012   Procedure: LAPAROSCOPY DIAGNOSTIC;  Surgeon: Logan Bores, MD;  Location: [facility];  Service: Gynecology;  Laterality: N/A;  . POLYPECTOMY  05/20/2019   Procedure: POLYPECTOMY;  Surgeon: Juanita Craver, MD;  Location: [facility];  Service: Endoscopy;;  . WISDOM TOOTH EXTRACTION      Family History  Problem Relation Age of Onset  . Diabetes Father   . Hypertension Father   . Arthritis Father   . Depression Father   . Hearing loss Father   . Diabetes Mother   . Hypertension Mother   . Arthritis Mother   . Depression Mother   . Asthma Paternal Grandmother   . Kidney disease Paternal Grandmother   . Arthritis Paternal Grandmother   . Cancer Paternal Grandmother   . COPD Paternal Grandmother   . Hearing loss Paternal Grandmother   . Heart disease Paternal Grandmother   . Hypertension Paternal Grandmother   .  Hyperlipidemia Paternal Grandmother   . Stroke Paternal Grandmother   . Heart attack Paternal Grandmother   . Depression Sister   . Emphysema Other   . Tongue cancer Other   . Coronary artery disease Other   . COPD Son   . Drug abuse Son   . Arthritis Maternal Grandmother   . COPD Maternal Grandmother   . Depression Maternal Grandmother   . Heart disease Maternal Grandmother   . Early death Maternal Grandfather   . Heart attack Maternal Grandfather   . COPD Paternal Grandfather   . Heart attack Paternal Grandfather   . Breast cancer Neg Hx     Social History   Socioeconomic History  . Marital status: Legally Separated    Spouse name: Not on file  . Number of children: 2  . Years of education: Not on file  . Highest education level: Not on file  Occupational History  . Occupation: Unemployed  Social Needs  . Financial resource strain: Not on file  . Food insecurity    Worry: Not on file    Inability: Not on file  . Transportation needs    Medical: Not on file    Non-medical: Not on file  Tobacco Use  . Smoking status: Current Some Day Smoker    Packs/day: 1.00    Years: 25.00    Pack years:  25.00    Types: Cigarettes  . Smokeless tobacco: Never Used  . Tobacco comment: 6-7 cig. daily  Substance and Sexual Activity  . Alcohol use: No  . Drug use: No    Types: Cocaine    Comment: last cocaine use 2014  . Sexual activity: Not Currently    Partners: Male    Birth control/protection: None  Lifestyle  . Physical activity    Days per week: Not on file    Minutes per session: Not on file  . Stress: Not on file  Relationships  . Social Herbalist on phone: Not on file    Gets together: Not on file    Attends religious service: Not on file    Active member of club or organization: Not on file    Attends meetings of clubs or organizations: Not on file    Relationship status: Not on file  . Intimate partner violence    Fear of current or ex partner:  Not on file    Emotionally abused: Not on file    Physically abused: Not on file    Forced sexual activity: Not on file  Other Topics Concern  . Not on file  Social History Narrative   She lives at home with her spouse. She does not exercise.   She currently denies recent recreational drug use.      Current Outpatient Medications:  .  acetaminophen (TYLENOL) 500 MG tablet, Take 1,000 mg by mouth every 6 (six) hours as needed for moderate pain., Disp: , Rfl:  .  albuterol (VENTOLIN HFA) 108 (90 Base) MCG/ACT inhaler, Inhale 2 puffs into the lungs every 6 (six) hours as needed for wheezing or shortness of breath., Disp: 18 g, Rfl: 1 .  ARIPiprazole (ABILIFY) 2 MG tablet, Take 2 mg by mouth daily., Disp: , Rfl:  .  benztropine (COGENTIN) 1 MG tablet, Take 1 mg by mouth 2 (two) times daily., Disp: , Rfl:  .  buPROPion (WELLBUTRIN SR) 150 MG 12 hr tablet, Take 150 mg by mouth every evening., Disp: , Rfl:  .  carbamazepine (TEGRETOL XR) 100 MG 12 hr tablet, Take 100-200 mg by mouth See admin instructions. Take 100 mg in the morning and 200 mg at night, Disp: , Rfl:  .  cycloSPORINE (RESTASIS) 0.05 % ophthalmic emulsion, Place 1 drop into both eyes 2 (two) times daily as needed (dry eyes)., Disp: , Rfl:  .  escitalopram (LEXAPRO) 10 MG tablet, Take 10 mg by mouth at bedtime., Disp: , Rfl:  .  famotidine (PEPCID) 40 MG tablet, Take 1 tablet (40 mg total) by mouth at bedtime., Disp: 30 tablet, Rfl: 2 .  Fluticasone-Salmeterol (ADVAIR DISKUS) 250-50 MCG/DOSE AEPB, Inhale 1 puff into the lungs 2 (two) times daily., Disp: 1 each, Rfl: 3 .  hydrOXYzine (ATARAX/VISTARIL) 10 MG tablet, Take 10 mg by mouth 3 (three) times daily as needed for anxiety. , Disp: , Rfl:  .  lamoTRIgine (LAMICTAL) 100 MG tablet, Take 100 mg by mouth 2 (two) times daily., Disp: , Rfl:  .  linaclotide (LINZESS) 145 MCG CAPS capsule, Take 1 capsule (145 mcg total) by mouth daily before breakfast., Disp: 90 capsule, Rfl: 1 .   lurasidone (LATUDA) 20 MG TABS tablet, Take 20 mg by mouth at bedtime. , Disp: , Rfl:  .  omeprazole (PRILOSEC) 40 MG capsule, TAKE 1 CAPSULE BY MOUTH DAILY, Disp: 90 capsule, Rfl: 3 .  pregabalin (LYRICA) 300 MG capsule, TAKE 1  CAPSULE BY MOUTH TWICE DAILY, Disp: 60 capsule, Rfl: 2 .  propranolol (INDERAL) 40 MG tablet, Take 1 tablet (40 mg total) by mouth 2 (two) times daily., Disp: 60 tablet, Rfl: 2 .  Spacer/Aero-Holding Chambers (AEROCHAMBER PLUS) inhaler, Use as instructed to use with inahaler., Disp: 1 each, Rfl: 1 .  terbinafine (LAMISIL) 250 MG tablet, Take 1 tablet (250 mg total) by mouth daily. (Patient not taking: Reported on 05/13/2019), Disp: 90 tablet, Rfl: 0 .  traZODone (DESYREL) 50 MG tablet, Take 50-100 mg by mouth at bedtime as needed for sleep. , Disp: , Rfl:   EXAM:  VITALS per patient if applicable:N/A  GENERAL: alert, oriented, appears well and in no acute distress  HEENT: atraumatic, conjunctiva clear, no obvious abnormalities on inspection. Mild dysphonia. NECK: normal movements of the head and neck  LUNGS: on inspection no signs of respiratory distress, breathing rate appears normal, no obvious gross SOB, gasping or wheezing. A couple episodes of non productive cough during visit.  CV: no obvious cyanosis  MS: moves all visible extremities without noticeable abnormality  PSYCH/NEURO: pleasant and cooperative, no obvious depression,she is anxious.  ASSESSMENT AND PLAN:  Discussed the following assessment and plan:  Cough We discussed possible etiologies of chronic cough. ? COPD,GERD,and allergies among some. She would like CXR done, it will be arranged. She follows wit pulmonologist for OSA,Dr [facility]. Instructed to arrange appt to discuss problem.  Gastroesophageal reflux disease, unspecified whether esophagitis present - Plan: famotidine (PEPCID) 40 MG tablet This problem could be contributing to cough and dysphonia. Recommend adding Pepcid at  bedtime. Continue Omeprazole 40 mg daily. GERD precautions.  Reactive airway disease that is not asthma - Plan: Fluticasone-Salmeterol (ADVAIR DISKUS) 250-50 MCG/DOSE AEPB ? COPD. Stop Flovent. Advair 250-50 mcg bid recommended. Continue Albuterol inh. Smoking cessation strongly recommended. Instructed about warning signs.  Dysphonia ? Allergies,GERD among some. Smoking cessation recommended. Voice rest. If problem is persistent we need to consider ENT evaluation.    I discussed the assessment and treatment plan with the patient. She was provided an opportunity to ask questions and all were answered. She agreed with the plan and demonstrated an understanding of the instructions.   The patient was advised to call back or seek an in-person evaluation if the symptoms worsen or if the condition fails to improve as anticipated.  Return in about 2 months (around 10/27/2019) for cough/wheezing.    Betty Martinique, MD

## 2019-08-28 ENCOUNTER — Other Ambulatory Visit: Payer: Self-pay | Admitting: Family Medicine

## 2019-08-28 ENCOUNTER — Other Ambulatory Visit: Payer: Self-pay

## 2019-08-28 ENCOUNTER — Ambulatory Visit (INDEPENDENT_AMBULATORY_CARE_PROVIDER_SITE_OTHER): Payer: Medicare HMO

## 2019-08-28 ENCOUNTER — Other Ambulatory Visit: Payer: Medicare HMO

## 2019-08-28 DIAGNOSIS — R0602 Shortness of breath: Secondary | ICD-10-CM

## 2019-08-28 DIAGNOSIS — R739 Hyperglycemia, unspecified: Secondary | ICD-10-CM

## 2019-08-28 DIAGNOSIS — D509 Iron deficiency anemia, unspecified: Secondary | ICD-10-CM

## 2019-08-28 DIAGNOSIS — Z0000 Encounter for general adult medical examination without abnormal findings: Secondary | ICD-10-CM

## 2019-08-28 DIAGNOSIS — K5900 Constipation, unspecified: Secondary | ICD-10-CM

## 2019-08-28 DIAGNOSIS — R05 Cough: Secondary | ICD-10-CM | POA: Diagnosis not present

## 2019-08-28 NOTE — Addendum Note (Signed)
Addended by: Suzette Battiest on: 08/28/2019 10:28 AM   Modules accepted: Orders

## 2019-08-28 NOTE — Addendum Note (Signed)
Addended by: Suzette Battiest on: 08/28/2019 10:33 AM   Modules accepted: Orders

## 2019-08-29 ENCOUNTER — Encounter: Payer: Self-pay | Admitting: Family Medicine

## 2019-09-01 DIAGNOSIS — R69 Illness, unspecified: Secondary | ICD-10-CM | POA: Diagnosis not present

## 2019-09-08 ENCOUNTER — Encounter: Payer: Medicare HMO | Admitting: Family Medicine

## 2019-09-09 DIAGNOSIS — R69 Illness, unspecified: Secondary | ICD-10-CM | POA: Diagnosis not present

## 2019-09-15 DIAGNOSIS — G4733 Obstructive sleep apnea (adult) (pediatric): Secondary | ICD-10-CM | POA: Diagnosis not present

## 2019-09-16 DIAGNOSIS — R69 Illness, unspecified: Secondary | ICD-10-CM | POA: Diagnosis not present

## 2019-09-22 ENCOUNTER — Other Ambulatory Visit: Payer: Self-pay | Admitting: Family Medicine

## 2019-09-22 DIAGNOSIS — J989 Respiratory disorder, unspecified: Secondary | ICD-10-CM

## 2019-09-22 DIAGNOSIS — R0989 Other specified symptoms and signs involving the circulatory and respiratory systems: Secondary | ICD-10-CM

## 2019-10-06 DIAGNOSIS — R69 Illness, unspecified: Secondary | ICD-10-CM | POA: Diagnosis not present

## 2019-10-16 DIAGNOSIS — G4733 Obstructive sleep apnea (adult) (pediatric): Secondary | ICD-10-CM | POA: Diagnosis not present

## 2019-10-18 ENCOUNTER — Other Ambulatory Visit: Payer: Self-pay | Admitting: Family Medicine

## 2019-10-18 DIAGNOSIS — R0989 Other specified symptoms and signs involving the circulatory and respiratory systems: Secondary | ICD-10-CM

## 2019-10-18 DIAGNOSIS — J989 Respiratory disorder, unspecified: Secondary | ICD-10-CM

## 2019-10-20 NOTE — Telephone Encounter (Signed)
Medication not on current med list.  

## 2019-10-23 DIAGNOSIS — R69 Illness, unspecified: Secondary | ICD-10-CM | POA: Diagnosis not present

## 2019-10-27 DIAGNOSIS — R69 Illness, unspecified: Secondary | ICD-10-CM | POA: Diagnosis not present

## 2019-10-27 DIAGNOSIS — F603 Borderline personality disorder: Secondary | ICD-10-CM | POA: Diagnosis not present

## 2019-11-05 DIAGNOSIS — F603 Borderline personality disorder: Secondary | ICD-10-CM | POA: Diagnosis not present

## 2019-11-05 DIAGNOSIS — R69 Illness, unspecified: Secondary | ICD-10-CM | POA: Diagnosis not present

## 2019-11-15 DIAGNOSIS — G4733 Obstructive sleep apnea (adult) (pediatric): Secondary | ICD-10-CM | POA: Diagnosis not present

## 2019-11-24 ENCOUNTER — Other Ambulatory Visit: Payer: Self-pay | Admitting: Family Medicine

## 2019-11-24 DIAGNOSIS — R0989 Other specified symptoms and signs involving the circulatory and respiratory systems: Secondary | ICD-10-CM

## 2019-11-24 DIAGNOSIS — K219 Gastro-esophageal reflux disease without esophagitis: Secondary | ICD-10-CM

## 2019-11-24 DIAGNOSIS — J989 Respiratory disorder, unspecified: Secondary | ICD-10-CM

## 2019-11-26 ENCOUNTER — Ambulatory Visit: Payer: Medicare HMO | Admitting: Family Medicine

## 2019-11-27 DIAGNOSIS — F603 Borderline personality disorder: Secondary | ICD-10-CM | POA: Diagnosis not present

## 2019-11-27 DIAGNOSIS — R69 Illness, unspecified: Secondary | ICD-10-CM | POA: Diagnosis not present

## 2019-12-03 DIAGNOSIS — R69 Illness, unspecified: Secondary | ICD-10-CM | POA: Diagnosis not present

## 2019-12-03 DIAGNOSIS — F603 Borderline personality disorder: Secondary | ICD-10-CM | POA: Diagnosis not present

## 2019-12-10 DIAGNOSIS — R69 Illness, unspecified: Secondary | ICD-10-CM | POA: Diagnosis not present

## 2019-12-10 DIAGNOSIS — F603 Borderline personality disorder: Secondary | ICD-10-CM | POA: Diagnosis not present

## 2019-12-17 DIAGNOSIS — R69 Illness, unspecified: Secondary | ICD-10-CM | POA: Diagnosis not present

## 2019-12-17 DIAGNOSIS — F603 Borderline personality disorder: Secondary | ICD-10-CM | POA: Diagnosis not present

## 2019-12-25 DIAGNOSIS — R69 Illness, unspecified: Secondary | ICD-10-CM | POA: Diagnosis not present

## 2019-12-26 ENCOUNTER — Other Ambulatory Visit: Payer: Self-pay | Admitting: Family Medicine

## 2019-12-26 DIAGNOSIS — M797 Fibromyalgia: Secondary | ICD-10-CM

## 2019-12-26 NOTE — Telephone Encounter (Signed)
Rx last filled 11/11/2019.

## 2019-12-29 ENCOUNTER — Telehealth: Payer: Self-pay | Admitting: Family Medicine

## 2019-12-29 NOTE — Telephone Encounter (Signed)
Mediation refill: Lyrica Pharmacy: [facility] Fax: [phone]

## 2019-12-29 NOTE — Telephone Encounter (Signed)
Rx last filled 12/22

## 2019-12-30 NOTE — Telephone Encounter (Signed)
Prescription for Lyrica was sent. She is due for follow-up. Thanks, BJ

## 2020-01-02 ENCOUNTER — Telehealth: Payer: Medicare HMO | Admitting: Family Medicine

## 2020-01-05 ENCOUNTER — Other Ambulatory Visit: Payer: Medicare HMO

## 2020-01-09 DIAGNOSIS — R69 Illness, unspecified: Secondary | ICD-10-CM | POA: Diagnosis not present

## 2020-01-22 DIAGNOSIS — R69 Illness, unspecified: Secondary | ICD-10-CM | POA: Diagnosis not present

## 2020-01-23 DIAGNOSIS — R69 Illness, unspecified: Secondary | ICD-10-CM | POA: Diagnosis not present

## 2020-01-30 DIAGNOSIS — R69 Illness, unspecified: Secondary | ICD-10-CM | POA: Diagnosis not present

## 2020-01-30 DIAGNOSIS — F603 Borderline personality disorder: Secondary | ICD-10-CM | POA: Diagnosis not present

## 2020-02-02 ENCOUNTER — Other Ambulatory Visit: Payer: Self-pay | Admitting: Family Medicine

## 2020-02-02 ENCOUNTER — Telehealth: Payer: Self-pay

## 2020-02-02 DIAGNOSIS — M797 Fibromyalgia: Secondary | ICD-10-CM

## 2020-02-02 NOTE — Telephone Encounter (Signed)
Pt is scheduled virtually on 3/16 at 4pm

## 2020-02-02 NOTE — Telephone Encounter (Signed)
Patient is due for medication follow up. Appointment can be in office or virtual.

## 2020-02-02 NOTE — Telephone Encounter (Signed)
Last OV: 08/27/2019 Next OV: not currently scheduled - message sent to front staff to get her scheduled. Rx last filled: 12/30/2019 Med Contract: not on file.

## 2020-02-03 ENCOUNTER — Telehealth: Payer: Medicare HMO | Admitting: Family Medicine

## 2020-02-03 ENCOUNTER — Encounter: Payer: Self-pay | Admitting: Family Medicine

## 2020-02-04 ENCOUNTER — Telehealth: Payer: Medicare HMO | Admitting: Family Medicine

## 2020-02-09 ENCOUNTER — Encounter: Payer: Self-pay | Admitting: Family Medicine

## 2020-02-09 ENCOUNTER — Telehealth (INDEPENDENT_AMBULATORY_CARE_PROVIDER_SITE_OTHER): Payer: Medicare HMO | Admitting: Family Medicine

## 2020-02-09 VITALS — Ht 67.0 in | Wt 190.0 lb

## 2020-02-09 DIAGNOSIS — J989 Respiratory disorder, unspecified: Secondary | ICD-10-CM

## 2020-02-09 DIAGNOSIS — R0989 Other specified symptoms and signs involving the circulatory and respiratory systems: Secondary | ICD-10-CM

## 2020-02-09 DIAGNOSIS — L659 Nonscarring hair loss, unspecified: Secondary | ICD-10-CM | POA: Diagnosis not present

## 2020-02-09 DIAGNOSIS — R413 Other amnesia: Secondary | ICD-10-CM | POA: Diagnosis not present

## 2020-02-09 DIAGNOSIS — R053 Chronic cough: Secondary | ICD-10-CM

## 2020-02-09 DIAGNOSIS — G4733 Obstructive sleep apnea (adult) (pediatric): Secondary | ICD-10-CM

## 2020-02-09 DIAGNOSIS — R05 Cough: Secondary | ICD-10-CM

## 2020-02-09 DIAGNOSIS — Z9989 Dependence on other enabling machines and devices: Secondary | ICD-10-CM | POA: Diagnosis not present

## 2020-02-09 DIAGNOSIS — I10 Essential (primary) hypertension: Secondary | ICD-10-CM

## 2020-02-09 DIAGNOSIS — M797 Fibromyalgia: Secondary | ICD-10-CM

## 2020-02-09 MED ORDER — FLUTICASONE-SALMETEROL 250-50 MCG/DOSE IN AEPB: 1.0000 | INHALATION_SPRAY | Freq: Two times a day (BID) | RESPIRATORY_TRACT | 3 refills | Status: DC

## 2020-02-09 MED ORDER — PROPRANOLOL HCL 40 MG PO TABS: 40.0000 mg | ORAL_TABLET | Freq: Two times a day (BID) | ORAL | 2 refills | Status: DC

## 2020-02-09 MED ORDER — VENTOLIN HFA 108 (90 BASE) MCG/ACT IN AERS: INHALATION_SPRAY | RESPIRATORY_TRACT | 1 refills | Status: DC

## 2020-02-09 NOTE — Assessment & Plan Note (Signed)
Problem is not well controlled. She may need another sleep study. Recommend continue following with Dr. Halford Chessman. Referral placed.

## 2020-02-09 NOTE — Assessment & Plan Note (Signed)
BP has been well controlled during prior visits. Recommend monitoring BP regularly. Continue propranolol 40 mg twice daily. Low-salt diet is also recommended.

## 2020-02-09 NOTE — Progress Notes (Signed)
Virtual Visit via Video Note   I connected with [name] [name] on 02/09/20 by a video enabled telemedicine application and verified that I am speaking with the correct person using two identifiers.  Location patient: home Location provider:work office Persons participating in the virtual visit: patient, provider  I discussed the limitations of evaluation and management by telemedicine and the availability of in person appointments. The patient expressed understanding and agreed to proceed.   HPI: [name] [name] is a 49 year old female with history of migraines, hypertension, GERD, and bipolar disorder, insomnia who is being seen today to follow on some chronic health problems. She was last seen on 08/27/2019.  She is still having nonproductive cough, which started about 01/2019.  Problem aggravates anxiety, having panic attacks. She has not identified exacerbating or alleviating factors. It seems to be worse at night.  She has history of GERD, states that she has not had frequent heartburn since she been taking omeprazole 40 mg daily.  CXR no acute abnormalities, both lungs clear.  + Smoker. Albuterol inhaler helps home. She is not using Advair 250-50 mcg consistently, she is not sure how to use it. Negative for wheezing and hemoptysis.  OSA:She is concerned because she is having episodes of apnea despite the fact she is wearing her CPAP machine as recommended. Current CPAP pressure is 17. She has not had a sleep study in a few years. She was following with Dr. Halford Chessman. She has not gained weight, per the contrary she has lost weight, she is now 190 pounds.  She is also c/o hair loss. She has had problem in the past,improved after discontinuing alcohol. Her mother is receiving chemotherapy and according to patient, a nurse told her that hair loss could be caused by chemo agent exposure when she is in contact with her mother. Negative for alopecic area or scalp lesions.  Lab Results  Component  Value Date   TSH 1.81 04/07/2013    Fibromyalgia: Currently she is on Lyrica 300 mg twice daily. In the past she has try to decrease dose back pain has gotten worse. Complaining about memory difficulties caused by Lyrica, she wonders if there is another medication she can take. She has tried gabapentin in the past, and successfully.  HTN: She is on propranolol 40 mg twice daily, which also help with tremor and anxiety. She is not checking BP regularly. Negative for unusual/severe headaches, visual changes, CP, dyspnea, focal weakness, or edema.  Lab Results  Component Value Date   CREATININE 0.82 01/15/2019   BUN 16 01/15/2019   NA 139 01/15/2019   K 4.4 01/15/2019   CL 103 01/15/2019   CO2 27 01/15/2019   She follows with psychiatry regularly. According to patient, her psychiatrist would like to have some labs done with her next lab appointment.  ROS: See pertinent positives and negatives per HPI.  Past Medical History:  Diagnosis Date  . Anxiety   . Bipolar disorder ([place])   . Chronic kidney disease    kidney infections  . Depression   . Fibromyalgia   . GERD (gastroesophageal reflux disease)   . History of cervical dysplasia   . History of exercise intolerance    normal ETT 10-24-2016  . History of kidney stones   . Hyperlipidemia   . Hypertension   . Migraines   . Osteoarthritis   . PCOS (polycystic ovarian syndrome)   . Pneumonia 2018  . Rash    in area of eswl 04-23-2017  . Right ureteral calculus   .  Sleep apnea   . Tachycardia cardiologist-  dr harding   controlled w/ metoprolol    Past Surgical History:  Procedure Laterality Date  . ANTERIOR CERVICAL DECOMP/DISCECTOMY FUSION N/A 09/01/2015   Procedure: ANTERIOR CERVICAL DECOMPRESSION/DISCECTOMY FUSION PLATING BONEGRAFT CERVICAL FIVE-SIX;  Surgeon: Kevan Ny Ditty, MD;  Location: [facility];  Service: Neurosurgery;  Laterality: N/A;  . BIOPSY  05/20/2019   Procedure: BIOPSY;  Surgeon: Juanita Craver, MD;  Location: [facility];  Service: Endoscopy;;  . BREAST EXCISIONAL BIOPSY Bilateral left 10/15/2002;   right 1997   left ductal system excision (papilloma w/ florid epithelial hyperplasia)/  right benign lumpectomy  . CARDIOVASCULAR STRESS TEST  04/27/2009   normal nuclear study w/ no ischemia/  normal LV function and wall motion , ef 66%  . CHOLECYSTECTOMY N/A 03/13/2013   Procedure: LAPAROSCOPIC CHOLECYSTECTOMY;  Surgeon: Harl Bowie, MD;  Location: [facility];  Service: General;  Laterality: N/A;  . COLONOSCOPY  last one 11-08-2006  . COLONOSCOPY N/A 05/20/2019   Procedure: COLONOSCOPY;  Surgeon: Juanita Craver, MD;  [facility]: [facility];  Service: Endoscopy;  Laterality: N/A;  . CYSTOSCOPY N/A 05/08/2018   Procedure: CYSTOSCOPY;  Surgeon: Paula Compton, MD;  Location: [facility];  Service: Gynecology;  Laterality: N/A;  . CYSTOSCOPY WITH RETROGRADE PYELOGRAM, URETEROSCOPY AND STENT PLACEMENT Right 05/18/2017   Procedure: CYSTOSCOPY WITH RETROGRADE PYELOGRAM, URETEROSCOPY AND STENT PLACEMENT;  Surgeon: Alexis Frock, MD;  Location: [facility];  Service: Urology;  Laterality: Right;  . DX LAPAROSCOPY W/ LYSIS ADHESIONS/  LASER VAPORIZATION OF CERVIX  06/25/2002  . ESOPHAGOGASTRODUODENOSCOPY  04/25/2005  . ESOPHAGOGASTRODUODENOSCOPY (EGD) WITH PROPOFOL N/A 05/20/2019   Procedure: ESOPHAGOGASTRODUODENOSCOPY (EGD) WITH PROPOFOL;  Surgeon: Juanita Craver, MD;  [facility];  Service: Endoscopy;  Laterality: N/A;  . EXPLORATORY LEFT THUMB REPAIR TENDON/ LACERATION  09/27/1999  . EXTRACORPOREAL SHOCK WAVE LITHOTRIPSY Right 04/23/2017   Procedure: RIGHT EXTRACORPOREAL SHOCK WAVE LITHOTRIPSY (ESWL);  Surgeon: Alexis Frock, MD;  Location: [facility];  Service: Urology;  Laterality: Right;  . EXTRACORPOREAL SHOCK WAVE LITHOTRIPSY Right 04/23/2017  . HOLMIUM LASER APPLICATION Right [id number]   Procedure: HOLMIUM LASER APPLICATION;  Surgeon: Alexis Frock, MD;  Location: [facility];  Service: Urology;  Laterality: Right;  . LAPAROSCOPIC VAGINAL HYSTERECTOMY WITH SALPINGECTOMY Bilateral 05/08/2018   Procedure: LAPAROSCOPIC ASSISTED VAGINAL HYSTERECTOMY WITH SALPINGECTOMY,  I & D Sebaceous Left Labia;  Surgeon: Paula Compton, MD;  Location: [facility];  Service: Gynecology;  Laterality: Bilateral;  . LAPAROSCOPY  07/30/2012   Procedure: LAPAROSCOPY DIAGNOSTIC;  Surgeon: Logan Bores, MD;  Location: [facility];  Service: Gynecology;  Laterality: N/A;  . POLYPECTOMY  05/20/2019   Procedure: POLYPECTOMY;  Surgeon: Juanita Craver, MD;  [facility];  Service: Endoscopy;;  . WISDOM TOOTH EXTRACTION      Family History  Problem Relation Age of Onset  . Diabetes Father   . Hypertension Father   . Arthritis Father   . Depression Father   . Hearing loss Father   . Diabetes Mother   . Hypertension Mother   . Arthritis Mother   . Depression Mother   . Asthma Paternal Grandmother   . Kidney disease Paternal Grandmother   . Arthritis Paternal Grandmother   . Cancer Paternal Grandmother   . COPD Paternal Grandmother   . Hearing loss Paternal Grandmother   . Heart disease Paternal Grandmother   . Hypertension Paternal Grandmother   . Hyperlipidemia Paternal Grandmother   . Stroke Paternal  Grandmother   . Heart attack Paternal Grandmother   . Depression Sister   . Emphysema Other   . Tongue cancer Other   . Coronary artery disease Other   . COPD Son   . Drug abuse Son   . Arthritis Maternal Grandmother   . COPD Maternal Grandmother   . Depression Maternal Grandmother   . Heart disease Maternal Grandmother   . Early death Maternal Grandfather   . Heart attack Maternal Grandfather   . COPD Paternal Grandfather   . Heart attack Paternal Grandfather   . Breast cancer Neg Hx     Social History   Socioeconomic History  . Marital status: Legally Separated    Spouse name: Not on file  . Number of children: 2   . Years of education: Not on file  . Highest education level: Not on file  Occupational History  . Occupation: Unemployed  Tobacco Use  . Smoking status: Current Some Day Smoker    Packs/day: 1.00    Years: 25.00    Pack years: 25.00    Types: Cigarettes  . Smokeless tobacco: Never Used  . Tobacco comment: 6-7 cig. daily  Substance and Sexual Activity  . Alcohol use: No  . Drug use: No    Types: Cocaine    Comment: last cocaine use 2014  . Sexual activity: Not Currently    Partners: Male    Birth control/protection: None  Other Topics Concern  . Not on file  Social History Narrative   She lives at home with her spouse. She does not exercise.   She currently denies recent recreational drug use.   Social Determinants of Health   Financial Resource Strain:   . Difficulty of Paying Living Expenses:   Food Insecurity:   . Worried About Charity fundraiser in the Last Year:   . Arboriculturist in the Last Year:   Transportation Needs:   . Film/video editor (Medical):   Marland Kitchen Lack of Transportation (Non-Medical):   Physical Activity:   . Days of Exercise per Week:   . Minutes of Exercise per Session:   Stress:   . Feeling of Stress :   Social Connections:   . Frequency of Communication with Friends and Family:   . Frequency of Social Gatherings with Friends and Family:   . Attends Religious Services:   . Active Member of Clubs or Organizations:   . Attends Archivist Meetings:   Marland Kitchen Marital Status:   Intimate Partner Violence:   . Fear of Current or Ex-Partner:   . Emotionally Abused:   Marland Kitchen Physically Abused:   . Sexually Abused:     Current Outpatient Medications:  .  acetaminophen (TYLENOL) 500 MG tablet, Take 1,000 mg by mouth every 6 (six) hours as needed for moderate pain., Disp: , Rfl:  .  ARIPiprazole (ABILIFY) 2 MG tablet, Take 2 mg by mouth daily., Disp: , Rfl:  .  benztropine (COGENTIN) 1 MG tablet, Take 1 mg by mouth 2 (two) times daily., Disp: ,  Rfl:  .  buPROPion (WELLBUTRIN SR) 150 MG 12 hr tablet, Take 150 mg by mouth every evening., Disp: , Rfl:  .  carbamazepine (TEGRETOL XR) 100 MG 12 hr tablet, Take 100-200 mg by mouth See admin instructions. Take 100 mg in the morning and 200 mg at night, Disp: , Rfl:  .  cycloSPORINE (RESTASIS) 0.05 % ophthalmic emulsion, Place 1 drop into both eyes 2 (two) times daily as  needed (dry eyes)., Disp: , Rfl:  .  escitalopram (LEXAPRO) 10 MG tablet, Take 10 mg by mouth at bedtime., Disp: , Rfl:  .  famotidine (PEPCID) 40 MG tablet, TAKE 1 TABLET BY MOUTH DAILY AT BEDTIME, Disp: 30 tablet, Rfl: 2 .  Fluticasone-Salmeterol (ADVAIR DISKUS) 250-50 MCG/DOSE AEPB, Inhale 1 puff into the lungs 2 (two) times daily., Disp: 1 each, Rfl: 3 .  hydrOXYzine (ATARAX/VISTARIL) 10 MG tablet, Take 10 mg by mouth 3 (three) times daily as needed for anxiety. , Disp: , Rfl:  .  lamoTRIgine (LAMICTAL) 100 MG tablet, Take 100 mg by mouth 2 (two) times daily., Disp: , Rfl:  .  linaclotide (LINZESS) 145 MCG CAPS capsule, Take 1 capsule (145 mcg total) by mouth daily before breakfast., Disp: 90 capsule, Rfl: 1 .  lurasidone (LATUDA) 20 MG TABS tablet, Take 20 mg by mouth at bedtime. , Disp: , Rfl:  .  omeprazole (PRILOSEC) 40 MG capsule, TAKE 1 CAPSULE BY MOUTH DAILY, Disp: 90 capsule, Rfl: 3 .  pregabalin (LYRICA) 300 MG capsule, TAKE 1 CAPSULE BY MOUTH TWICE DAILY, Disp: 60 capsule, Rfl: 0 .  propranolol (INDERAL) 40 MG tablet, Take 1 tablet (40 mg total) by mouth 2 (two) times daily., Disp: 180 tablet, Rfl: 2 .  Spacer/Aero-Holding Chambers (AEROCHAMBER PLUS) inhaler, Use as instructed to use with inahaler., Disp: 1 each, Rfl: 1 .  terbinafine (LAMISIL) 250 MG tablet, Take 1 tablet (250 mg total) by mouth daily., Disp: 90 tablet, Rfl: 0 .  traZODone (DESYREL) 50 MG tablet, Take 50-100 mg by mouth at bedtime as needed for sleep. , Disp: , Rfl:  .  VENTOLIN HFA 108 (90 Base) MCG/ACT inhaler, INHALE 2 PUFFS INTO THE LUNGS EVERY  6 HOURS AS NEEDED FOR WHEEZING OR SHORTNESS OF BREATH, Disp: 18 g, Rfl: 1  EXAM:  VITALS per patient if applicable:Ht 5\' 7"  (1.702 m)   Wt 190 lb (86.2 kg) Comment: Pet pt report,wt at home.  LMP 04/30/2018   BMI 29.76 kg/m   GENERAL: alert, oriented, appears well and in no acute distress  HEENT: atraumatic, conjunctiva clear, no obvious abnormalities on inspection.  NECK: normal movements of the head and neck  LUNGS: on inspection no signs of respiratory distress, breathing rate appears normal, no obvious gross SOB, gasping or wheezing  CV: no obvious cyanosis  PSYCH/NEURO: pleasant and cooperative, no obvious depression, + anxious. Speech and thought processing grossly intact  ASSESSMENT AND PLAN:  Discussed the following assessment and plan: Orders Placed This Encounter  Procedures  . Ambulatory referral to Pulmonology   Memory difficulties We discussed possible etiologies. Some of her chronic medical problems/medications can be contributing factors: OSA, depression, Lyrica.  Recommend discussing this problem with her psychiatrist. If starts getting worse, we need to consider neurology referral.  OSA on CPAP Problem is not well controlled. She may need another sleep study. Recommend continue following with Dr. Halford Chessman. Referral placed.  HTN (hypertension) BP has been well controlled during prior visits. Recommend monitoring BP regularly. Continue propranolol 40 mg twice daily. Low-salt diet is also recommended.  Hair loss disorder We discussed possible etiologies. Most likely telogen effluvium. She has some labs ordered last year, she will arrange lab appointment.  Fibromyalgia Problem is otherwise well controlled with Lyrica 300 mg twice daily, so no changes for now. She has tried Gabapentin,did not help. Because the risk of interaction with some of her psychiatric medication,Cymbalta is not an option.  "Memory problems" improved after decreasing dose of  Lyrica back pain got worse and she asked to go back to current dose.  Low impact exercise and good sleep hygiene are also recommended.   Chronic cough I recommend pulmonology evaluation. We reviewed possible etiologies, including GERD, allergies, and COPD among some. It seems like albuterol has helped. Smoking cessation will help.  She has lab orders placed. Recommend arranging lab appt when it is convenient for her.   I discussed the assessment and treatment plan with the patient. [name] Kanter was provided an opportunity to ask questions and all were answered. She agreed with the plan and demonstrated an understanding of the instructions.   Return in about 4 months (around 06/10/2020) for 4 months f/u. She needs lab appt this or next week..    Cloyde Oregel [place], MD

## 2020-02-09 NOTE — Assessment & Plan Note (Signed)
Problem is otherwise well controlled with Lyrica 300 mg twice daily, so no changes for now. She has tried Gabapentin,did not help. Because the risk of interaction with some of her psychiatric medication,Cymbalta is not an option.  "Memory problems" improved after decreasing dose of Lyrica back pain got worse and she asked to go back to current dose.  Low impact exercise and good sleep hygiene are also recommended.

## 2020-02-09 NOTE — Assessment & Plan Note (Signed)
We discussed possible etiologies. Most likely telogen effluvium. She has some labs ordered last year, she will arrange lab appointment.

## 2020-02-11 ENCOUNTER — Other Ambulatory Visit: Payer: Self-pay

## 2020-02-12 ENCOUNTER — Other Ambulatory Visit: Payer: Medicare HMO

## 2020-02-18 DIAGNOSIS — F603 Borderline personality disorder: Secondary | ICD-10-CM | POA: Diagnosis not present

## 2020-02-18 DIAGNOSIS — R69 Illness, unspecified: Secondary | ICD-10-CM | POA: Diagnosis not present

## 2020-02-25 DIAGNOSIS — R69 Illness, unspecified: Secondary | ICD-10-CM | POA: Diagnosis not present

## 2020-02-26 DIAGNOSIS — R69 Illness, unspecified: Secondary | ICD-10-CM | POA: Diagnosis not present

## 2020-03-08 DIAGNOSIS — R69 Illness, unspecified: Secondary | ICD-10-CM | POA: Diagnosis not present

## 2020-03-15 ENCOUNTER — Other Ambulatory Visit: Payer: Self-pay

## 2020-03-16 ENCOUNTER — Other Ambulatory Visit: Payer: Medicare HMO

## 2020-03-17 DIAGNOSIS — R69 Illness, unspecified: Secondary | ICD-10-CM | POA: Diagnosis not present

## 2020-03-30 ENCOUNTER — Other Ambulatory Visit: Payer: Self-pay | Admitting: Family Medicine

## 2020-03-30 DIAGNOSIS — M797 Fibromyalgia: Secondary | ICD-10-CM

## 2020-04-01 DIAGNOSIS — R69 Illness, unspecified: Secondary | ICD-10-CM | POA: Diagnosis not present

## 2020-04-07 ENCOUNTER — Other Ambulatory Visit: Payer: Medicare HMO

## 2020-04-23 ENCOUNTER — Institutional Professional Consult (permissible substitution): Payer: Medicare HMO | Admitting: Pulmonary Disease

## 2020-05-03 DIAGNOSIS — R69 Illness, unspecified: Secondary | ICD-10-CM | POA: Diagnosis not present

## 2020-05-10 DIAGNOSIS — R69 Illness, unspecified: Secondary | ICD-10-CM | POA: Diagnosis not present

## 2020-05-12 ENCOUNTER — Ambulatory Visit: Payer: Medicare HMO | Admitting: Family Medicine

## 2020-05-25 ENCOUNTER — Ambulatory Visit: Payer: Medicare HMO | Admitting: Family Medicine

## 2020-05-26 ENCOUNTER — Telehealth: Payer: Self-pay | Admitting: Family Medicine

## 2020-05-26 NOTE — Telephone Encounter (Signed)
Attempted to schedule AWV. Unable to LVM.  Will try at later time.  

## 2020-06-14 ENCOUNTER — Ambulatory Visit: Payer: Medicare HMO | Admitting: Family Medicine

## 2020-06-22 ENCOUNTER — Ambulatory Visit: Payer: Medicare HMO | Admitting: Family Medicine

## 2020-06-22 ENCOUNTER — Other Ambulatory Visit (INDEPENDENT_AMBULATORY_CARE_PROVIDER_SITE_OTHER): Payer: Medicare HMO

## 2020-06-22 DIAGNOSIS — Z0289 Encounter for other administrative examinations: Secondary | ICD-10-CM

## 2020-06-22 DIAGNOSIS — D509 Iron deficiency anemia, unspecified: Secondary | ICD-10-CM

## 2020-06-22 DIAGNOSIS — Z0000 Encounter for general adult medical examination without abnormal findings: Secondary | ICD-10-CM

## 2020-06-22 DIAGNOSIS — R739 Hyperglycemia, unspecified: Secondary | ICD-10-CM | POA: Diagnosis not present

## 2020-06-22 LAB — HEPATIC FUNCTION PANEL
ALT: 16 U/L (ref 0–35)
AST: 15 U/L (ref 0–37)
Albumin: 4.3 g/dL (ref 3.5–5.2)
Alkaline Phosphatase: 70 U/L (ref 39–117)
Bilirubin, Direct: 0.1 mg/dL (ref 0.0–0.3)
Total Bilirubin: 0.6 mg/dL (ref 0.2–1.2)
Total Protein: 7 g/dL (ref 6.0–8.3)

## 2020-06-22 LAB — CBC WITH DIFFERENTIAL/PLATELET
Basophils Absolute: 0 10*3/uL (ref 0.0–0.1)
Basophils Relative: 0.3 % (ref 0.0–3.0)
Eosinophils Absolute: 0.2 10*3/uL (ref 0.0–0.7)
Eosinophils Relative: 1.3 % (ref 0.0–5.0)
HCT: 44.5 % (ref 36.0–46.0)
Hemoglobin: 15 g/dL (ref 12.0–15.0)
Lymphocytes Relative: 18 % (ref 12.0–46.0)
Lymphs Abs: 2.4 10*3/uL (ref 0.7–4.0)
MCHC: 33.7 g/dL (ref 30.0–36.0)
MCV: 86.9 fl (ref 78.0–100.0)
Monocytes Absolute: 0.8 10*3/uL (ref 0.1–1.0)
Monocytes Relative: 6.4 % (ref 3.0–12.0)
Neutro Abs: 9.7 10*3/uL — ABNORMAL HIGH (ref 1.4–7.7)
Neutrophils Relative %: 74 % (ref 43.0–77.0)
Platelets: 342 10*3/uL (ref 150.0–400.0)
RBC: 5.12 Mil/uL — ABNORMAL HIGH (ref 3.87–5.11)
RDW: 13.3 % (ref 11.5–15.5)
WBC: 13.1 10*3/uL — ABNORMAL HIGH (ref 4.0–10.5)

## 2020-06-22 LAB — LIPID PANEL
Cholesterol: 177 mg/dL (ref 0–200)
HDL: 58 mg/dL (ref >39.00)
LDL Cholesterol: 99 mg/dL (ref 0–99)
NonHDL: 119.33
Total CHOL/HDL Ratio: 3
Triglycerides: 100 mg/dL (ref 0.0–149.0)
VLDL: 20 mg/dL (ref 0.0–40.0)

## 2020-06-22 LAB — BASIC METABOLIC PANEL
BUN: 7 mg/dL (ref 6–23)
CO2: 24 mEq/L (ref 19–32)
Calcium: 9.3 mg/dL (ref 8.4–10.5)
Chloride: 104 mEq/L (ref 96–112)
Creatinine, Ser: 0.84 mg/dL (ref 0.40–1.20)
GFR: 72.76 mL/min (ref >60.00)
Glucose, Bld: 128 mg/dL — ABNORMAL HIGH (ref 70–99)
Potassium: 3.4 mEq/L — ABNORMAL LOW (ref 3.5–5.1)
Sodium: 137 mEq/L (ref 135–145)

## 2020-06-22 LAB — HEMOGLOBIN A1C: Hgb A1c MFr Bld: 5.4 % (ref 4.6–6.5)

## 2020-06-22 LAB — TSH: TSH: 3.39 u[IU]/mL (ref 0.35–4.50)

## 2020-06-23 DIAGNOSIS — R69 Illness, unspecified: Secondary | ICD-10-CM | POA: Diagnosis not present

## 2020-07-05 ENCOUNTER — Other Ambulatory Visit: Payer: Self-pay | Admitting: Family Medicine

## 2020-07-05 DIAGNOSIS — N6001 Solitary cyst of right breast: Secondary | ICD-10-CM

## 2020-07-20 DIAGNOSIS — R69 Illness, unspecified: Secondary | ICD-10-CM | POA: Diagnosis not present

## 2020-07-21 ENCOUNTER — Other Ambulatory Visit: Payer: Medicare HMO

## 2020-07-27 DIAGNOSIS — R69 Illness, unspecified: Secondary | ICD-10-CM | POA: Diagnosis not present

## 2020-08-13 ENCOUNTER — Ambulatory Visit: Payer: Medicare HMO | Admitting: Family Medicine

## 2020-08-31 DIAGNOSIS — R69 Illness, unspecified: Secondary | ICD-10-CM | POA: Diagnosis not present

## 2020-10-07 ENCOUNTER — Other Ambulatory Visit: Payer: Self-pay | Admitting: Family Medicine

## 2020-10-07 DIAGNOSIS — M797 Fibromyalgia: Secondary | ICD-10-CM

## 2020-10-08 DIAGNOSIS — R69 Illness, unspecified: Secondary | ICD-10-CM | POA: Diagnosis not present

## 2020-10-08 NOTE — Telephone Encounter (Signed)
Last filled 08/05/20

## 2020-10-22 ENCOUNTER — Ambulatory Visit: Payer: Medicare HMO | Admitting: Family Medicine

## 2020-10-26 ENCOUNTER — Encounter: Payer: Self-pay | Admitting: Family Medicine

## 2020-10-26 ENCOUNTER — Other Ambulatory Visit: Payer: Self-pay

## 2020-10-26 ENCOUNTER — Ambulatory Visit (INDEPENDENT_AMBULATORY_CARE_PROVIDER_SITE_OTHER): Payer: Medicare HMO | Admitting: Family Medicine

## 2020-10-26 VITALS — BP 124/80 | HR 82 | Temp 98.0°F | Resp 12 | Ht 67.0 in | Wt 159.8 lb

## 2020-10-26 DIAGNOSIS — M545 Low back pain, unspecified: Secondary | ICD-10-CM

## 2020-10-26 DIAGNOSIS — L659 Nonscarring hair loss, unspecified: Secondary | ICD-10-CM | POA: Diagnosis not present

## 2020-10-26 DIAGNOSIS — I10 Essential (primary) hypertension: Secondary | ICD-10-CM | POA: Diagnosis not present

## 2020-10-26 DIAGNOSIS — F319 Bipolar disorder, unspecified: Secondary | ICD-10-CM | POA: Insufficient documentation

## 2020-10-26 DIAGNOSIS — M797 Fibromyalgia: Secondary | ICD-10-CM | POA: Diagnosis not present

## 2020-10-26 DIAGNOSIS — F603 Borderline personality disorder: Secondary | ICD-10-CM | POA: Insufficient documentation

## 2020-10-26 DIAGNOSIS — R69 Illness, unspecified: Secondary | ICD-10-CM | POA: Diagnosis not present

## 2020-10-26 MED ORDER — BUPROPION HCL ER (SR) 150 MG PO TB12: 150.0000 mg | ORAL_TABLET | Freq: Every evening | ORAL | 1 refills | Status: DC

## 2020-10-26 MED ORDER — BENZTROPINE MESYLATE 1 MG PO TABS: 1.0000 mg | ORAL_TABLET | Freq: Two times a day (BID) | ORAL | 1 refills | Status: DC

## 2020-10-26 MED ORDER — LATUDA 20 MG PO TABS: 20.0000 mg | ORAL_TABLET | Freq: Every day | ORAL | 1 refills | Status: DC

## 2020-10-26 MED ORDER — LAMOTRIGINE 100 MG PO TABS: 100.0000 mg | ORAL_TABLET | Freq: Two times a day (BID) | ORAL | 1 refills | Status: DC

## 2020-10-26 MED ORDER — ARIPIPRAZOLE 2 MG PO TABS: 2.0000 mg | ORAL_TABLET | Freq: Every day | ORAL | 1 refills | Status: DC

## 2020-10-26 MED ORDER — ESCITALOPRAM OXALATE 10 MG PO TABS: 10.0000 mg | ORAL_TABLET | Freq: Every day | ORAL | 1 refills | Status: DC

## 2020-10-26 MED ORDER — CARBAMAZEPINE ER 100 MG PO TB12: 100.0000 mg | ORAL_TABLET | ORAL | 1 refills | Status: DC

## 2020-10-26 MED ORDER — PREGABALIN 300 MG PO CAPS: 300.0000 mg | ORAL_CAPSULE | Freq: Two times a day (BID) | ORAL | 2 refills | Status: DC

## 2020-10-26 MED ORDER — HYDROXYZINE HCL 10 MG PO TABS: 10.0000 mg | ORAL_TABLET | Freq: Three times a day (TID) | ORAL | 1 refills | Status: DC | PRN

## 2020-10-26 NOTE — Assessment & Plan Note (Signed)
BP otherwise adequately controlled, DBP mildly elevated. Continue propranolol 40 mg twice daily. Low-salt diet. Recommend monitoring BP at home.

## 2020-10-26 NOTE — Patient Instructions (Addendum)
A few things to remember from today's visit:   Hair loss disorder - Plan: CBC, TSH, Ferritin  Fibromyalgia - Plan: pregabalin (LYRICA) 300 MG capsule  Essential hypertension - Plan: COMPLETE METABOLIC PANEL WITH GFR, TSH  Borderline personality disorder in adult ([place]) - Plan: lurasidone (LATUDA) 20 MG TABS tablet, escitalopram (LEXAPRO) 10 MG tablet, lamoTRIgine (LAMICTAL) 100 MG tablet, benztropine (COGENTIN) 1 MG tablet, buPROPion (WELLBUTRIN SR) 150 MG 12 hr tablet, ARIPiprazole (ABILIFY) 2 MG tablet, hydrOXYzine (ATARAX/VISTARIL) 10 MG tablet, Urinalysis, Routine w reflex microscopic, carbamazepine (TEGRETOL XR) 100 MG 12 hr tablet, DISCONTINUED: carbamazepine (TEGRETOL XR) 100 MG 12 hr tablet  If you need refills please call your pharmacy. Do not use My Chart to request refills or for acute issues that need immediate attention.   No changes today.  Suicidal Feelings: How to Help Yourself Suicide is when you end your own life. There are many things you can do to help yourself feel better when struggling with these feelings. Many services and people are available to support you and others who struggle with similar feelings.  If you ever feel like you may hurt yourself or others, or have thoughts about taking your own life, get help right away. To get help:  Call your local emergency services (911 in the [place]  [address] [place] [address]'s health and human services helpline (211 in the [place]  Go to your nearest emergency department.  Call a suicide hotline to speak with a trained counselor. The following suicide hotlines are available in the [place]: ? 1-800-273-TALK 325 195 6107). ? 1-800-SUICIDE 217 288 6748). ? 520-418-6308. This is a hotline for Spanish speakers. ? 303-801-8376. This is a hotline for TTY users. ? 1-866-4-U-TREVOR 978 546 9642). This is a hotline for lesbian, gay, bisexual, transgender, or questioning youth. ? For a list of hotlines in [place], visit  ParkingAffiliatePrograms.se.html  Contact a crisis center or a local suicide prevention center. To find a crisis center or suicide prevention center: ? Call your local hospital, clinic, community service organization, mental health center, social service provider, or health department. Ask for help with connecting to a crisis center. ? For a list of crisis centers in the [place], visit: suicidepreventionlifeline.org ? For a list of crisis centers in [place], visit: suicideprevention.ca How to help yourself feel better   Promise yourself that you will not do anything extreme when you have suicidal feelings. Remember, there is hope. Many people have gotten through suicidal thoughts and feelings, and you can too. If you have had these feelings before, remind yourself that you can get through them again.  Let family, friends, teachers, or counselors know how you are feeling. Try not to separate yourself from those who care about you and want to help you. Talk with someone every day, even if you do not feel sociable. Face-to-face conversation is best to help them understand your feelings.  Contact a mental health care provider and work with this person regularly.  Make a safety plan that you can follow during a crisis. Include phone numbers of suicide prevention hotlines, mental health professionals, and trusted friends and family members you can call during an emergency. Save these numbers on your phone.  If you are thinking of taking a lot of medicine, give your medicine to someone who can give it to you as prescribed. If you are on antidepressants and are concerned you will overdose, tell your health care provider so that he or she can give you safer medicines.  Try to stick to your routines.  Follow a schedule every day. Make self-care a priority.  Make a list of realistic goals, and cross them off when you achieve them. Accomplishments can give you a sense  of worth.  Wait until you are feeling better before doing things that you find difficult or unpleasant.  Do things that you have always enjoyed to take your mind off your feelings. Try reading a book, or listening to or playing music. Spending time outside, in nature, may help you feel better. Follow these instructions at home:   Visit your primary health care provider every year for a checkup.  Work with a mental health care provider as needed.  Eat a well-balanced diet, and eat regular meals.  Get plenty of rest.  Exercise if you are able. Just 30 minutes of exercise each day can help you feel better.  Take over-the-counter and prescription medicines only as told by your health care provider. Ask your mental health care provider about the possible side effects of any medicines you are taking.  Do not use alcohol or drugs, and remove these substances from your home.  Remove weapons, poisons, knives, and other deadly items from your home. General recommendations  Keep your living space well lit.  When you are feeling well, write yourself a letter with tips and support that you can read when you are not feeling well.  Remember that life's difficulties can be sorted out with help. Conditions can be treated, and you can learn behaviors and ways of thinking that will help you. Where to find more information  National Suicide Prevention Lifeline: www.suicidepreventionlifeline.org  Hopeline: www.hopeline.[place] for Suicide Prevention: PromotionalLoans.co.za  The [employer] (for lesbian, gay, bisexual, transgender, or questioning youth): www.thetrevorproject.org Contact a health care provider if:  You feel as though you are a burden to others.  You feel agitated, angry, vengeful, or have extreme mood swings.  You have withdrawn from family and friends. Get help right away if:  You are talking about suicide or wishing to die.  You start making plans for how to  commit suicide.  You feel that you have no reason to live.  You start making plans for putting your affairs in order, saying goodbye, or giving your possessions away.  You feel guilt, shame, or unbearable pain, and it seems like there is no way out.  You are frequently using drugs or alcohol.  You are engaging in risky behaviors that could lead to death. If you have any of these symptoms, get help right away. Call emergency services, go to your nearest emergency department or crisis center, or call a suicide crisis helpline. Summary  Suicide is when you take your own life.  Promise yourself that you will not do anything extreme when you have suicidal feelings.  Let family, friends, teachers, or counselors know how you are feeling.  Get help right away if you feel as though life is getting too tough to handle and you are thinking about suicide. This information is not intended to replace advice given to you by your health care provider. Make sure you discuss any questions you have with your health care provider. Document Revised: 02/27/2019 Document Reviewed: 06/19/2017 Elsevier Patient Education  [place].  Please be sure medication list is accurate. If a new problem present, please set up appointment sooner than planned today.  PSYCHIATRIC OFFICES AND PSYCHIATRISTS IN THE AREA  When psychiatric evaluation is discussed or recommended referral is not necessary. This is a list of  options around [place], [place] that you can call and arrange an appointment.  -[facility] [phone] -[facility] [phone] [facility] [phone] -[facility] [phone]  Dr Hardie Shackleton [phone] Dr Alexander Bergeron, MD [phone] Dr Chucky May, MD [phone])   Dr Allayne Butcher. Marcelino Freestone, MD [phone] Dr Geralyn Flash A. [facility]  Dr Sheralyn Boatman, MD [phone])  Dr  Fredonia Highland, MD [phone]  Dr Luz Lex [phone]
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
## 2020-10-26 NOTE — Assessment & Plan Note (Signed)
Problem is a stable. Continue Lyrica 300 mg twice daily.

## 2020-10-26 NOTE — Assessment & Plan Note (Addendum)
Problem is not well controlled. Today I refilled all her medication, she has been taking them for long time at has been well-tolerated. She was clearly instructed about warning signs. She understands that she needs to call 911 if she started having suicidal ideations. A list of psychiatrists in the area was given. Follow-up in 2 weeks, before if needed.

## 2020-10-26 NOTE — Assessment & Plan Note (Signed)
We discussed possible etiologies. In the past blood work has been otherwise negative.

## 2020-10-27 LAB — COMPLETE METABOLIC PANEL WITH GFR
AG Ratio: 1.8 (calc) (ref 1.0–2.5)
ALT: 9 U/L (ref 6–29)
AST: 9 U/L — ABNORMAL LOW (ref 10–35)
Albumin: 3.7 g/dL (ref 3.6–5.1)
Alkaline phosphatase (APISO): 61 U/L (ref 31–125)
BUN: 12 mg/dL (ref 7–25)
CO2: 28 mmol/L (ref 20–32)
Calcium: 8.6 mg/dL (ref 8.6–10.2)
Chloride: 106 mmol/L (ref 98–110)
Creat: 0.79 mg/dL (ref 0.50–1.10)
GFR, Est African American: 103 mL/min/{1.73_m2} (ref >=60)
GFR, Est Non African American: 89 mL/min/{1.73_m2} (ref >=60)
Globulin: 2.1 g/dL (calc) (ref 1.9–3.7)
Glucose, Bld: 95 mg/dL (ref 65–99)
Potassium: 4.6 mmol/L (ref 3.5–5.3)
Sodium: 139 mmol/L (ref 135–146)
Total Bilirubin: 0.3 mg/dL (ref 0.2–1.2)
Total Protein: 5.8 g/dL — ABNORMAL LOW (ref 6.1–8.1)

## 2020-10-27 LAB — CBC
HCT: 41.7 % (ref 35.0–45.0)
Hemoglobin: 14.2 g/dL (ref 11.7–15.5)
MCH: 30 pg (ref 27.0–33.0)
MCHC: 34.1 g/dL (ref 32.0–36.0)
MCV: 88 fL (ref 80.0–100.0)
MPV: 10.7 fL (ref 7.5–12.5)
Platelets: 357 10*3/uL (ref 140–400)
RBC: 4.74 10*6/uL (ref 3.80–5.10)
RDW: 11.7 % (ref 11.0–15.0)
WBC: 11.4 10*3/uL — ABNORMAL HIGH (ref 3.8–10.8)

## 2020-10-27 LAB — URINALYSIS, ROUTINE W REFLEX MICROSCOPIC
Bilirubin Urine: NEGATIVE
Glucose, UA: NEGATIVE
Hgb urine dipstick: NEGATIVE
Ketones, ur: NEGATIVE
Leukocytes,Ua: NEGATIVE
Nitrite: NEGATIVE
Protein, ur: NEGATIVE
Specific Gravity, Urine: 1.022 (ref 1.001–1.03)
pH: 5.5 (ref 5.0–8.0)

## 2020-10-27 LAB — FERRITIN: Ferritin: 38 ng/mL (ref 16–232)

## 2020-10-27 LAB — TSH: TSH: 1.04 mIU/L

## 2020-10-28 DIAGNOSIS — R69 Illness, unspecified: Secondary | ICD-10-CM | POA: Diagnosis not present

## 2020-11-29 ENCOUNTER — Other Ambulatory Visit: Payer: Self-pay | Admitting: Family Medicine

## 2020-12-02 DIAGNOSIS — F411 Generalized anxiety disorder: Secondary | ICD-10-CM | POA: Diagnosis not present

## 2020-12-02 DIAGNOSIS — R69 Illness, unspecified: Secondary | ICD-10-CM | POA: Diagnosis not present

## 2020-12-07 DIAGNOSIS — Z113 Encounter for screening for infections with a predominantly sexual mode of transmission: Secondary | ICD-10-CM | POA: Diagnosis not present

## 2020-12-07 DIAGNOSIS — R69 Illness, unspecified: Secondary | ICD-10-CM | POA: Diagnosis not present

## 2020-12-07 DIAGNOSIS — N898 Other specified noninflammatory disorders of vagina: Secondary | ICD-10-CM | POA: Diagnosis not present

## 2020-12-17 ENCOUNTER — Inpatient Hospital Stay: Admission: RE | Admit: 2020-12-17 | Payer: Medicare HMO | Source: Ambulatory Visit

## 2020-12-17 ENCOUNTER — Other Ambulatory Visit: Payer: Medicare HMO

## 2020-12-20 DIAGNOSIS — G4733 Obstructive sleep apnea (adult) (pediatric): Secondary | ICD-10-CM | POA: Diagnosis not present

## 2020-12-20 DIAGNOSIS — Z20822 Contact with and (suspected) exposure to covid-19: Secondary | ICD-10-CM | POA: Diagnosis not present

## 2020-12-22 DIAGNOSIS — F411 Generalized anxiety disorder: Secondary | ICD-10-CM | POA: Diagnosis not present

## 2020-12-22 DIAGNOSIS — R69 Illness, unspecified: Secondary | ICD-10-CM | POA: Diagnosis not present

## 2020-12-27 ENCOUNTER — Ambulatory Visit: Payer: Medicare HMO | Admitting: Podiatry

## 2020-12-29 DIAGNOSIS — R69 Illness, unspecified: Secondary | ICD-10-CM | POA: Diagnosis not present

## 2021-01-11 ENCOUNTER — Other Ambulatory Visit: Payer: Self-pay

## 2021-01-19 ENCOUNTER — Other Ambulatory Visit: Payer: Self-pay | Admitting: Family Medicine

## 2021-01-19 DIAGNOSIS — F603 Borderline personality disorder: Secondary | ICD-10-CM

## 2021-01-27 ENCOUNTER — Telehealth: Payer: Self-pay | Admitting: Family Medicine

## 2021-01-27 NOTE — Telephone Encounter (Signed)
Spoke to patient to  schedule Medicare Annual Wellness Visit (AWV) either virtually or in office. She was driving and will call back to schedule   Last AWVI  04/02/2019  please schedule at anytime with [facility] Nurse Health Advisor 1 or 2   This should be a 45 minute visit.

## 2021-02-08 ENCOUNTER — Other Ambulatory Visit: Payer: Self-pay

## 2021-02-09 ENCOUNTER — Ambulatory Visit: Payer: Medicare HMO | Admitting: Family Medicine

## 2021-04-12 ENCOUNTER — Other Ambulatory Visit: Payer: Self-pay | Admitting: Family Medicine

## 2021-04-12 DIAGNOSIS — M797 Fibromyalgia: Secondary | ICD-10-CM

## 2021-04-14 DIAGNOSIS — G4733 Obstructive sleep apnea (adult) (pediatric): Secondary | ICD-10-CM | POA: Diagnosis not present

## 2021-04-19 ENCOUNTER — Telehealth: Payer: Self-pay | Admitting: Family Medicine

## 2021-04-19 NOTE — Telephone Encounter (Signed)
Left message for patient to call back and schedule Medicare Annual Wellness Visit (AWV) either virtually or in office.   Last AWVI   04/02/19  please schedule at anytime with [facility] Nurse Health Advisor 1 or 2   This should be a 45 minute visit.

## 2021-05-15 DIAGNOSIS — G4733 Obstructive sleep apnea (adult) (pediatric): Secondary | ICD-10-CM | POA: Diagnosis not present

## 2021-05-18 ENCOUNTER — Emergency Department (HOSPITAL_COMMUNITY)
Admission: EM | Admit: 2021-05-18 | Discharge: 2021-05-18 | Disposition: A | Discharge status: Home / Self Care | Payer: Medicare HMO | Attending: Emergency Medicine | Admitting: Emergency Medicine

## 2021-05-18 ENCOUNTER — Encounter (HOSPITAL_COMMUNITY): Payer: Self-pay

## 2021-05-18 DIAGNOSIS — R2241 Localized swelling, mass and lump, right lower limb: Secondary | ICD-10-CM | POA: Diagnosis not present

## 2021-05-18 DIAGNOSIS — N189 Chronic kidney disease, unspecified: Secondary | ICD-10-CM | POA: Diagnosis not present

## 2021-05-18 DIAGNOSIS — R69 Illness, unspecified: Secondary | ICD-10-CM | POA: Diagnosis not present

## 2021-05-18 DIAGNOSIS — I129 Hypertensive chronic kidney disease with stage 1 through stage 4 chronic kidney disease, or unspecified chronic kidney disease: Secondary | ICD-10-CM | POA: Diagnosis not present

## 2021-05-18 DIAGNOSIS — F17210 Nicotine dependence, cigarettes, uncomplicated: Secondary | ICD-10-CM | POA: Insufficient documentation

## 2021-05-18 DIAGNOSIS — T7840XA Allergy, unspecified, initial encounter: Secondary | ICD-10-CM | POA: Diagnosis not present

## 2021-05-18 DIAGNOSIS — E039 Hypothyroidism, unspecified: Secondary | ICD-10-CM | POA: Diagnosis not present

## 2021-05-18 MED ORDER — CETIRIZINE HCL 10 MG PO TABS: 10.0000 mg | ORAL_TABLET | Freq: Every day | ORAL | 0 refills | Status: DC

## 2021-05-18 MED ORDER — MONTELUKAST SODIUM 5 MG PO CHEW: 5.0000 mg | CHEWABLE_TABLET | Freq: Every day | ORAL | 0 refills | Status: DC

## 2021-05-18 MED ORDER — EPINEPHRINE 0.3 MG/0.3ML IJ SOAJ: 0.3000 mg | INTRAMUSCULAR | 0 refills | Status: DC | PRN

## 2021-05-18 NOTE — ED Triage Notes (Signed)
Patient reports being stung by multiple Yellow jackets yesterday and took benadryl yesterday.   Today the swelling is still present to the right foot and up to right knee.   C/o generalized itching.   Denies trouble breathing or shob.   7/10 pain.   A/Ox4 Ambulatory in triage.

## 2021-05-18 NOTE — Discharge Instructions (Addendum)
Keep the leg elevated.  Watch for increasing pain and swelling.  Watch for difficulty breathing.  Zyrtec may help.  The Singulair may also help since he cannot take the steroids.  Have the EpiPen for this or other allergic reactions.  Follow-up with your doctor because you may need further treatment and evaluation of the worsening allergy
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
## 2021-05-31 ENCOUNTER — Ambulatory Visit: Payer: Medicare HMO

## 2021-06-01 ENCOUNTER — Other Ambulatory Visit: Payer: Self-pay

## 2021-06-01 ENCOUNTER — Ambulatory Visit (INDEPENDENT_AMBULATORY_CARE_PROVIDER_SITE_OTHER): Payer: Medicare HMO

## 2021-06-01 DIAGNOSIS — Z0000 Encounter for general adult medical examination without abnormal findings: Secondary | ICD-10-CM | POA: Diagnosis not present

## 2021-06-01 NOTE — Progress Notes (Addendum)
Virtual Visit via Telephone Note  I connected with  [name] [name] on 06/01/21 at  1:45 PM EDT by telephone and verified that I am speaking with the correct person using two identifiers.  Location: Patient: Home Provider: Office Persons participating in the virtual visit: patient/Nurse Health Advisor   I discussed the limitations, risks, security and privacy concerns of performing an evaluation and management service by telephone and the availability of in person appointments. The patient expressed understanding and agreed to proceed.  Interactive audio and video telecommunications were attempted between this nurse and patient, however failed, due to patient having technical difficulties OR patient did not have access to video capability.  We continued and completed visit with audio only.  Some vital signs may be absent or patient reported.   Willette Brace, LPN   Subjective:   [name] [name] is a 50 y.o. female who presents for Medicare Annual (Subsequent) preventive examination.  Review of Systems     Cardiac Risk Factors include: hypertension;dyslipidemia     Objective:    There were no vitals filed for this visit. There is no height or weight on file to calculate BMI.  Advanced Directives 06/01/2021 05/18/2021 05/20/2019 05/08/2018 05/08/2018 04/26/2018 10/24/2017  Does Patient Have a Medical Advance Directive? Yes No No No No No No  Type of Advance Directive [place] in Chart? No - copy requested - - - - - -  Would patient like information on creating a medical advance directive? - - No - Patient declined No - Patient declined - Yes (MAU/Ambulatory/Procedural Areas - Information given) Yes (MAU/Ambulatory/Procedural Areas - Information given)  Pre-existing out of facility DNR order (yellow form or pink MOST form) - - - - - - -  Some encounter information is confidential and restricted. Go to Review  Flowsheets activity to see all data.    Current Medications (verified) Outpatient Encounter Medications as of 06/01/2021  Medication Sig   acetaminophen (TYLENOL) 500 MG tablet Take 1,000 mg by mouth every 6 (six) hours as needed for moderate pain.   ARIPiprazole (ABILIFY) 2 MG tablet TAKE 1 TABLET BY MOUTH DAILY   benztropine (COGENTIN) 1 MG tablet Take 1 tablet (1 mg total) by mouth 2 (two) times daily.   buPROPion (WELLBUTRIN SR) 150 MG 12 hr tablet Take 1 tablet (150 mg total) by mouth every evening.   carbamazepine (TEGRETOL XR) 100 MG 12 hr tablet Take 1-2 tablets (100-200 mg total) by mouth See admin instructions. Take 100 mg in the morning and 200 mg at night   cetirizine (ZYRTEC ALLERGY) 10 MG tablet Take 1 tablet (10 mg total) by mouth daily.   EPINEPHrine 0.3 mg/0.3 mL IJ SOAJ injection Inject 0.3 mg into the muscle as needed for anaphylaxis.   escitalopram (LEXAPRO) 10 MG tablet Take 1 tablet (10 mg total) by mouth at bedtime.   Fluticasone-Salmeterol (ADVAIR DISKUS) 250-50 MCG/DOSE AEPB Inhale 1 puff into the lungs 2 (two) times daily.   hydrOXYzine (ATARAX/VISTARIL) 10 MG tablet Take 1 tablet (10 mg total) by mouth 3 (three) times daily as needed for anxiety.   lamoTRIgine (LAMICTAL) 100 MG tablet TAKE 1 TABLET BY MOUTH TWICE DAILY   LATUDA 20 MG TABS tablet TAKE 1 TABLET BY MOUTH EACH NIGHT AT BEDTIME   montelukast (SINGULAIR) 5 MG chewable tablet Chew 1 tablet (5 mg total) by mouth at bedtime.   omeprazole (PRILOSEC) 40 MG  capsule TAKE 1 CAPSULE BY MOUTH DAILY   pregabalin (LYRICA) 300 MG capsule Take 1 capsule (300 mg total) by mouth 2 (two) times daily.   propranolol (INDERAL) 40 MG tablet Take 1 tablet (40 mg total) by mouth 2 (two) times daily.   Spacer/Aero-Holding Chambers (AEROCHAMBER PLUS) inhaler Use as instructed to use with inahaler.   VENTOLIN HFA 108 (90 Base) MCG/ACT inhaler INHALE 2 PUFFS INTO THE LUNGS EVERY 6 HOURS AS NEEDED FOR WHEEZING OR SHORTNESS OF BREATH    linaclotide (LINZESS) 145 MCG CAPS capsule Take 1 capsule (145 mcg total) by mouth daily before breakfast. (Patient not taking: Reported on 06/01/2021)   [DISCONTINUED] cycloSPORINE (RESTASIS) 0.05 % ophthalmic emulsion Place 1 drop into both eyes 2 (two) times daily as needed (dry eyes). (Patient not taking: Reported on 06/01/2021)   [DISCONTINUED] famotidine (PEPCID) 40 MG tablet TAKE 1 TABLET BY MOUTH DAILY AT BEDTIME (Patient not taking: Reported on 06/01/2021)   No facility-administered encounter medications on file as of 06/01/2021.    Allergies (verified) Aspirin, Bee venom, Diclofenac, Nsaids, Prednisone, Terbinafine and related, and Naproxen   History: Past Medical History:  Diagnosis Date   Anxiety    Bipolar disorder ([place])    Chronic kidney disease    kidney infections   Depression    Fibromyalgia    GERD (gastroesophageal reflux disease)    History of cervical dysplasia    History of exercise intolerance    normal ETT 10-24-2016   History of kidney stones    Hyperlipidemia    Hypertension    Migraines    Osteoarthritis    PCOS (polycystic ovarian syndrome)    Pneumonia 2018   Rash    in area of eswl 04-23-2017   Right ureteral calculus    Sleep apnea    Tachycardia cardiologist-  dr harding   controlled w/ metoprolol   Past Surgical History:  Procedure Laterality Date   ANTERIOR CERVICAL DECOMP/DISCECTOMY FUSION N/A 09/01/2015   Procedure: ANTERIOR CERVICAL DECOMPRESSION/DISCECTOMY FUSION PLATING BONEGRAFT CERVICAL FIVE-SIX;  Surgeon: Kevan Ny Ditty, MD;  Location: [facility];  Service: Neurosurgery;  Laterality: N/A;   BIOPSY  05/20/2019   Procedure: BIOPSY;  Surgeon: Juanita Craver, MD;  Location: [facility];  Service: Endoscopy;;   BREAST EXCISIONAL BIOPSY Bilateral left 10/15/2002;   right 1997   left ductal system excision (papilloma w/ florid epithelial hyperplasia)/  right benign lumpectomy   CARDIOVASCULAR STRESS TEST  04/27/2009   normal  nuclear study w/ no ischemia/  normal LV function and wall motion , ef 66%   CHOLECYSTECTOMY N/A 03/13/2013   Procedure: LAPAROSCOPIC CHOLECYSTECTOMY;  Surgeon: Harl Bowie, MD;  Location: [facility];  Service: General;  Laterality: N/A;   COLONOSCOPY  last one 11-08-2006   COLONOSCOPY N/A 05/20/2019   Procedure: COLONOSCOPY;  Surgeon: Juanita Craver, MD;  Location: [facility];  Service: Endoscopy;  Laterality: N/A;   CYSTOSCOPY N/A 05/08/2018   Procedure: CYSTOSCOPY;  Surgeon: Paula Compton, MD;  Location: [facility];  Service: Gynecology;  Laterality: N/A;   CYSTOSCOPY WITH RETROGRADE PYELOGRAM, URETEROSCOPY AND STENT PLACEMENT Right 05/18/2017   Procedure: CYSTOSCOPY WITH RETROGRADE PYELOGRAM, URETEROSCOPY AND STENT PLACEMENT;  Surgeon: Alexis Frock, MD;  Location: [facility];  Service: Urology;  Laterality: Right;   DX LAPAROSCOPY W/ LYSIS ADHESIONS/  LASER VAPORIZATION OF CERVIX  06/25/2002   ESOPHAGOGASTRODUODENOSCOPY  04/25/2005   ESOPHAGOGASTRODUODENOSCOPY (EGD) WITH PROPOFOL N/A 05/20/2019   Procedure: ESOPHAGOGASTRODUODENOSCOPY (EGD) WITH PROPOFOL;  Surgeon: Juanita Craver,  MD;  Location: [facility];  Service: Endoscopy;  Laterality: N/A;   EXPLORATORY LEFT THUMB REPAIR TENDON/ LACERATION  09/27/1999   EXTRACORPOREAL SHOCK WAVE LITHOTRIPSY Right 04/23/2017   Procedure: RIGHT EXTRACORPOREAL SHOCK WAVE LITHOTRIPSY (ESWL);  Surgeon: Alexis Frock, MD;  Location: [facility];  Service: Urology;  Laterality: Right;   EXTRACORPOREAL SHOCK WAVE LITHOTRIPSY Right 04/23/2017   HOLMIUM LASER APPLICATION Right 6/44/0347   Procedure: HOLMIUM LASER APPLICATION;  Surgeon: Alexis Frock, MD;  Location: [facility];  Service: Urology;  Laterality: Right;   LAPAROSCOPIC VAGINAL HYSTERECTOMY WITH SALPINGECTOMY Bilateral 05/08/2018   Procedure: LAPAROSCOPIC ASSISTED VAGINAL HYSTERECTOMY WITH SALPINGECTOMY,  I & D Sebaceous Left Labia;  Surgeon: Paula Compton, MD;  Location: [facility];  Service: Gynecology;  Laterality: Bilateral;   LAPAROSCOPY  07/30/2012   Procedure: LAPAROSCOPY DIAGNOSTIC;  Surgeon: Logan Bores, MD;  Location: [facility];  Service: Gynecology;  Laterality: N/A;   POLYPECTOMY  05/20/2019   Procedure: POLYPECTOMY;  Surgeon: Juanita Craver, MD;  [facility];  Service: Endoscopy;;   WISDOM TOOTH EXTRACTION     Family History  Problem Relation Age of Onset   Diabetes Father    Hypertension Father    Arthritis Father    Depression Father    Hearing loss Father    Diabetes Mother    Hypertension Mother    Arthritis Mother    Depression Mother    Asthma Paternal Grandmother    Kidney disease Paternal Grandmother    Arthritis Paternal Grandmother    Cancer Paternal Grandmother    COPD Paternal Grandmother    Hearing loss Paternal Grandmother    Heart disease Paternal Grandmother    Hypertension Paternal Grandmother    Hyperlipidemia Paternal Grandmother    Stroke Paternal Grandmother    Heart attack Paternal Grandmother    Depression Sister    Emphysema Other    Tongue cancer Other    Coronary artery disease Other    COPD Son    Drug abuse Son    Arthritis Maternal Grandmother    COPD Maternal Grandmother    Depression Maternal Grandmother    Heart disease Maternal Grandmother    Early death Maternal Grandfather    Heart attack Maternal Grandfather    COPD Paternal Grandfather    Heart attack Paternal Grandfather    Breast cancer Neg Hx    Social History   Socioeconomic History   Marital status: Legally Separated    Spouse name: Not on file   Number of children: 2   Years of education: Not on file   Highest education level: Not on file  Occupational History   Occupation: Unemployed  Tobacco Use   Smoking status: Some Days    Packs/day: 0.50    Years: 25.00    Pack years: 12.50    Types: Cigarettes   Smokeless tobacco: Never   Tobacco comments:    6-7 cig. daily  Vaping Use   Vaping  Use: Never used  Substance and Sexual Activity   Alcohol use: No   Drug use: No    Types: Cocaine    Comment: last cocaine use 2014   Sexual activity: Not Currently    Partners: Male    Birth control/protection: None  Other Topics Concern   Not on file  Social History Narrative   She lives at home with her spouse. She does not exercise.   She currently denies recent recreational drug use.   Social Determinants of Health   Financial  Resource Strain: Low Risk    Difficulty of Paying Living Expenses: Not hard at all  Food Insecurity: No Food Insecurity   Worried About Charity fundraiser in the Last Year: Never true   Ran Out of Food in the Last Year: Never true  Transportation Needs: No Transportation Needs   Lack of Transportation (Medical): No   Lack of Transportation (Non-Medical): No  Physical Activity: Inactive   Days of Exercise per Week: 0 days   Minutes of Exercise per Session: 0 min  Stress: Stress Concern Present   Feeling of Stress : Rather much  Social Connections: Socially Isolated   Frequency of Communication with Friends and Family: Once a week   Frequency of Social Gatherings with Friends and Family: Never   Attends Religious Services: Never   Printmaker: No   Attends Music therapist: Never   Marital Status: Separated    Tobacco Counseling Ready to quit: Not Answered Counseling given: Not Answered Tobacco comments: 6-7 cig. daily   Clinical Intake:  Pre-visit preparation completed: Yes  Pain : No/denies pain     BMI - recorded: 25.03 Nutritional Status: BMI 25 -29 Overweight Nutritional Risks: None Diabetes: No  How often do you need to have someone help you when you read instructions, pamphlets, or other written materials from your doctor or pharmacy?: 1 - Never  Diabetic?No  Interpreter Needed?: No  Information entered by :: Charlott Rakes, LPN   Activities of Daily Living In your present  state of health, do you have any difficulty performing the following activities: 06/01/2021  Hearing? Y  Comment mild loss  Vision? N  Difficulty concentrating or making decisions? Y  Walking or climbing stairs? Y  Comment can be  Dressing or bathing? N  Doing errands, shopping? N  Preparing Food and eating ? N  Using the Toilet? N  In the past six months, have you accidently leaked urine? N  Do you have problems with loss of bowel control? N  Managing your Medications? N  Managing your Finances? N  Housekeeping or managing your Housekeeping? N  Some recent data might be hidden    Patient Care Team: [place], [name], MD as PCP - General (Family Medicine)  Indicate any recent Medical Services you may have received from other than [facility] providers in the past year (date may be approximate).     Assessment:   This is a routine wellness examination for [place].  Hearing/Vision screen Hearing Screening - Comments:: Pt stated mild loss Vision Screening - Comments:: Pt stated she goes to [facility] for [facility] eye exams   Dietary issues and exercise activities discussed: Current Exercise Habits: The patient does not participate in regular exercise at present   Goals Addressed             This Visit's Progress    Patient Stated       Lose weight         Depression Screen PHQ 2/9 Scores 06/01/2021 04/02/2019 11/07/2012  PHQ - 2 Score 2 6 1   PHQ- 9 Score 6 - -  Exception Documentation - (No Data) -    Fall Risk Fall Risk  06/01/2021 04/02/2019  Falls in the past year? 0 0  Number falls in past yr: 0 0  Injury with Fall? 0 0  Risk for fall due to : Impaired vision -  Follow up Falls prevention discussed Education provided    [place]  THE HOME:  Any stairs in or around the home? No  If so, are there any without handrails? No  Home free of loose throw rugs in walkways, pet beds, electrical cords, etc? Yes  Adequate lighting in your home to reduce  risk of falls? Yes   ASSISTIVE DEVICES UTILIZED TO PREVENT FALLS:  Life alert? No  Use of a cane, walker or w/c? No  Grab bars in the bathroom? Yes  Shower chair or bench in shower? Yes  Elevated toilet seat or a handicapped toilet? No   TIMED UP AND GO:  Was the test performed? No .      Cognitive Function:     6CIT Screen 06/01/2021  What Year? 0 points  What month? 0 points  What time? 0 points  Count back from 20 0 points  Months in reverse 0 points  Repeat phrase 0 points  Total Score 0    Immunizations Immunization History  Administered Date(s) Administered   Influenza Whole 09/01/2009, 08/07/2012   Influenza,inj,Quad PF,6+ Mos 09/13/2018   Tdap 11/07/2012    TDAP status: Up to date  Flu Vaccine status: Due, Education has been provided regarding the importance of this vaccine. Advised may receive this vaccine at local pharmacy or Health Dept. Aware to provide a copy of the vaccination record if obtained from local pharmacy or Health Dept. Verbalized acceptance and understanding.  Pneumococcal vaccine status: Due, Education has been provided regarding the importance of this vaccine. Advised may receive this vaccine at local pharmacy or Health Dept. Aware to provide a copy of the vaccination record if obtained from local pharmacy or Health Dept. Verbalized acceptance and understanding.  Covid-19 vaccine status: Information provided on how to obtain vaccines.   Qualifies for Shingles Vaccine? Yes   Zostavax completed No   Shingrix Completed?: No.    Education has been provided regarding the importance of this vaccine. Patient has been advised to call insurance company to determine out of pocket expense if they have not yet received this vaccine. Advised may also receive vaccine at local pharmacy or Health Dept. Verbalized acceptance and understanding.  Screening Tests Health Maintenance  Topic Date Due   COVID-19 Vaccine (1) Never done   Pneumococcal Vaccine  20-84 Years old (1 - PCV) Never done   Hepatitis C Screening  Never done   INFLUENZA VACCINE  06/20/2021   TETANUS/TDAP  11/07/2022   COLONOSCOPY (Pts 45-34yrs Insurance coverage will need to be confirmed)  05/19/2029   HIV Screening  Completed   HPV VACCINES  Aged Out   PAP SMEAR-Modifier  Discontinued    Health Maintenance  Health Maintenance Due  Topic Date Due   COVID-19 Vaccine (1) Never done   Pneumococcal Vaccine 19-8 Years old (1 - PCV) Never done   Hepatitis C Screening  Never done    Colorectal cancer screening: Type of screening: Colonoscopy. Completed 05/20/19. Repeat every 10 years  Mammogram status: Completed 02/20/19. Repeat every year   Additional Screening:  Hepatitis C Screening: does qualify  Vision Screening: Recommended annual ophthalmology exams for early detection of glaucoma and other disorders of the eye. Is the patient up to date with their annual eye exam?  Yes  Who is the provider or what is the name of the office in which the patient attends annual eye exams? [facility] If pt is not established with a provider, would they like to be referred to a provider to establish care? No .   Dental Screening: Recommended annual dental  exams for proper oral hygiene  Community Resource Referral / Chronic Care Management: CRR required this visit?  No   CCM required this visit?  No      Plan:     I have personally reviewed and noted the following in the patient's chart:   Medical and social history Use of alcohol, tobacco or illicit drugs  Current medications and supplements including opioid prescriptions.  Functional ability and status Nutritional status Physical activity Advanced directives List of other physicians Hospitalizations, surgeries, and ER visits in previous 12 months Vitals Screenings to include cognitive, depression, and falls Referrals and appointments  In addition, I have reviewed and discussed with patient certain preventive  protocols, quality metrics, and best practice recommendations. A written personalized care plan for preventive services as well as general preventive health recommendations were provided to patient.     Willette Brace, LPN   0/01/4916   Nurse Notes: None

## 2021-06-01 NOTE — Patient Instructions (Signed)
[name] [name] , Thank you for taking time to come for your Medicare Wellness Visit. I appreciate your ongoing commitment to your health goals. Please review the following plan we discussed and let me know if I can assist you in the future.   Screening recommendations/referrals: Colonoscopy: Done 05/20/19 Mammogram: Done 02/20/19 Recommended yearly ophthalmology/optometry visit for glaucoma screening and checkup Recommended yearly dental visit for hygiene and checkup  Vaccinations: Influenza vaccine: Due 06/20/21 Pneumococcal vaccine: Due and discussed Tdap vaccine: Done 11/07/12 Covid-19: Pt stated one dose unsure of date  Advanced directives: Please bring a copy of your health care power of attorney and living will to the office at your convenience.  Conditions/risks identified: Lose weight  Next appointment: Follow up in one year for your annual wellness visit.   Preventive Care 40-64 Years, Female Preventive care refers to lifestyle choices and visits with your health care provider that can promote health and wellness. What does preventive care include? A yearly physical exam. This is also called an annual well check. Dental exams once or twice a year. Routine eye exams. Ask your health care provider how often you should have your eyes checked. Personal lifestyle choices, including: Daily care of your teeth and gums. Regular physical activity. Eating a healthy diet. Avoiding tobacco and drug use. Limiting alcohol use. Practicing safe sex. Taking low-dose aspirin daily starting at age 84. Taking vitamin and mineral supplements as recommended by your health care provider. What happens during an annual well check? The services and screenings done by your health care provider during your annual well check will depend on your age, overall health, lifestyle risk factors, and family history of disease. Counseling  Your health care provider may ask you questions about your: Alcohol  use. Tobacco use. Drug use. Emotional well-being. Home and relationship well-being. Sexual activity. Eating habits. Work and work Statistician. Method of birth control. Menstrual cycle. Pregnancy history. Screening  You may have the following tests or measurements: Height, weight, and BMI. Blood pressure. Lipid and cholesterol levels. These may be checked every 5 years, or more frequently if you are over 70 years old. Skin check. Lung cancer screening. You may have this screening every year starting at age 56 if you have a 30-pack-year history of smoking and currently smoke or have quit within the past 15 years. Fecal occult blood test (FOBT) of the stool. You may have this test every year starting at age 25. Flexible sigmoidoscopy or colonoscopy. You may have a sigmoidoscopy every 5 years or a colonoscopy every 10 years starting at age 61. Hepatitis C blood test. Hepatitis B blood test. Sexually transmitted disease (STD) testing. Diabetes screening. This is done by checking your blood sugar (glucose) after you have not eaten for a while (fasting). You may have this done every 1-3 years. Mammogram. This may be done every 1-2 years. Talk to your health care provider about when you should start having regular mammograms. This may depend on whether you have a family history of breast cancer. BRCA-related cancer screening. This may be done if you have a family history of breast, ovarian, tubal, or peritoneal cancers. Pelvic exam and Pap test. This may be done every 3 years starting at age 40. Starting at age 75, this may be done every 5 years if you have a Pap test in combination with an HPV test. Bone density scan. This is done to screen for osteoporosis. You may have this scan if you are at high risk for osteoporosis. Discuss your  test results, treatment options, and if necessary, the need for more tests with your health care provider. Vaccines  Your health care provider may recommend  certain vaccines, such as: Influenza vaccine. This is recommended every year. Tetanus, diphtheria, and acellular pertussis (Tdap, Td) vaccine. You may need a Td booster every 10 years. Zoster vaccine. You may need this after age 46. Pneumococcal 13-valent conjugate (PCV13) vaccine. You may need this if you have certain conditions and were not previously vaccinated. Pneumococcal polysaccharide (PPSV23) vaccine. You may need one or two doses if you smoke cigarettes or if you have certain conditions. Talk to your health care provider about which screenings and vaccines you need and how often you need them. This information is not intended to replace advice given to you by your health care provider. Make sure you discuss any questions you have with your health care provider. Document Released: 12/03/2015 Document Revised: 07/26/2016 Document Reviewed: 09/07/2015 Elsevier Interactive Patient Education  2017 [place] Prevention in the Home Falls can cause injuries. They can happen to people of all ages. There are many things you can do to make your home safe and to help prevent falls. What can I do on the outside of my home? Regularly fix the edges of walkways and driveways and fix any cracks. Remove anything that might make you trip as you walk through a door, such as a raised step or threshold. Trim any bushes or trees on the path to your home. Use bright outdoor lighting. Clear any walking paths of anything that might make someone trip, such as rocks or tools. Regularly check to see if handrails are loose or broken. Make sure that both sides of any steps have handrails. Any raised decks and porches should have guardrails on the edges. Have any leaves, snow, or ice cleared regularly. Use sand or salt on walking paths during winter. Clean up any spills in your garage right away. This includes oil or grease spills. What can I do in the bathroom? Use night lights. Install grab bars  by the toilet and in the tub and shower. Do not use towel bars as grab bars. Use non-skid mats or decals in the tub or shower. If you need to sit down in the shower, use a plastic, non-slip stool. Keep the floor dry. Clean up any water that spills on the floor as soon as it happens. Remove soap buildup in the tub or shower regularly. Attach bath mats securely with double-sided non-slip rug tape. Do not have throw rugs and other things on the floor that can make you trip. What can I do in the bedroom? Use night lights. Make sure that you have a light by your bed that is easy to reach. Do not use any sheets or blankets that are too big for your bed. They should not hang down onto the floor. Have a firm chair that has side arms. You can use this for support while you get dressed. Do not have throw rugs and other things on the floor that can make you trip. What can I do in the kitchen? Clean up any spills right away. Avoid walking on wet floors. Keep items that you use a lot in easy-to-reach places. If you need to reach something above you, use a strong step stool that has a grab bar. Keep electrical cords out of the way. Do not use floor polish or wax that makes floors slippery. If you must use wax, use non-skid  floor wax. Do not have throw rugs and other things on the floor that can make you trip. What can I do with my stairs? Do not leave any items on the stairs. Make sure that there are handrails on both sides of the stairs and use them. Fix handrails that are broken or loose. Make sure that handrails are as long as the stairways. Check any carpeting to make sure that it is firmly attached to the stairs. Fix any carpet that is loose or worn. Avoid having throw rugs at the top or bottom of the stairs. If you do have throw rugs, attach them to the floor with carpet tape. Make sure that you have a light switch at the top of the stairs and the bottom of the stairs. If you do not have them, ask  someone to add them for you. What else can I do to help prevent falls? Wear shoes that: Do not have high heels. Have rubber bottoms. Are comfortable and fit you well. Are closed at the toe. Do not wear sandals. If you use a stepladder: Make sure that it is fully opened. Do not climb a closed stepladder. Make sure that both sides of the stepladder are locked into place. Ask someone to hold it for you, if possible. Clearly mark and make sure that you can see: Any grab bars or handrails. First and last steps. Where the edge of each step is. Use tools that help you move around (mobility aids) if they are needed. These include: Canes. Walkers. Scooters. Crutches. Turn on the lights when you go into a dark area. Replace any light bulbs as soon as they burn out. Set up your furniture so you have a clear path. Avoid moving your furniture around. If any of your floors are uneven, fix them. If there are any pets around you, be aware of where they are. Review your medicines with your doctor. Some medicines can make you feel dizzy. This can increase your chance of falling. Ask your doctor what other things that you can do to help prevent falls. This information is not intended to replace advice given to you by your health care provider. Make sure you discuss any questions you have with your health care provider. Document Released: 09/02/2009 Document Revised: 04/13/2016 Document Reviewed: 12/11/2014 Elsevier Interactive Patient Education  2017 [employer].

## 2021-06-08 ENCOUNTER — Telehealth: Payer: Self-pay | Admitting: Family Medicine

## 2021-06-08 NOTE — Telephone Encounter (Signed)
Pt is calling in needing a referral letter stating for her to continue to see her therapist Garfield Cornea at [facility]) effective immediately.  Reason for the let.  Pt would like to have a call once it is ready and she would like to view it through [employer].

## 2021-06-08 NOTE — Telephone Encounter (Signed)
Letter completed, patient is aware.

## 2021-06-08 NOTE — Telephone Encounter (Signed)
Okay for letter

## 2021-06-13 ENCOUNTER — Other Ambulatory Visit: Payer: Self-pay

## 2021-06-13 ENCOUNTER — Encounter: Payer: Self-pay | Admitting: Family Medicine

## 2021-06-13 ENCOUNTER — Other Ambulatory Visit (INDEPENDENT_AMBULATORY_CARE_PROVIDER_SITE_OTHER): Payer: Medicare HMO

## 2021-06-13 ENCOUNTER — Ambulatory Visit (INDEPENDENT_AMBULATORY_CARE_PROVIDER_SITE_OTHER): Payer: Medicare HMO | Admitting: Family Medicine

## 2021-06-13 VITALS — BP 120/70 | HR 70 | Resp 16 | Ht 67.0 in | Wt 174.1 lb

## 2021-06-13 DIAGNOSIS — M797 Fibromyalgia: Secondary | ICD-10-CM | POA: Diagnosis not present

## 2021-06-13 DIAGNOSIS — F3130 Bipolar disorder, current episode depressed, mild or moderate severity, unspecified: Secondary | ICD-10-CM

## 2021-06-13 DIAGNOSIS — I10 Essential (primary) hypertension: Secondary | ICD-10-CM | POA: Diagnosis not present

## 2021-06-13 DIAGNOSIS — E785 Hyperlipidemia, unspecified: Secondary | ICD-10-CM

## 2021-06-13 DIAGNOSIS — K5900 Constipation, unspecified: Secondary | ICD-10-CM | POA: Diagnosis not present

## 2021-06-13 DIAGNOSIS — M5416 Radiculopathy, lumbar region: Secondary | ICD-10-CM

## 2021-06-13 DIAGNOSIS — R000 Tachycardia, unspecified: Secondary | ICD-10-CM | POA: Diagnosis not present

## 2021-06-13 DIAGNOSIS — F603 Borderline personality disorder: Secondary | ICD-10-CM

## 2021-06-13 DIAGNOSIS — M542 Cervicalgia: Secondary | ICD-10-CM

## 2021-06-13 DIAGNOSIS — R079 Chest pain, unspecified: Secondary | ICD-10-CM

## 2021-06-13 DIAGNOSIS — R310 Gross hematuria: Secondary | ICD-10-CM

## 2021-06-13 DIAGNOSIS — M4722 Other spondylosis with radiculopathy, cervical region: Secondary | ICD-10-CM | POA: Diagnosis not present

## 2021-06-13 DIAGNOSIS — R69 Illness, unspecified: Secondary | ICD-10-CM | POA: Diagnosis not present

## 2021-06-13 LAB — URINALYSIS, ROUTINE W REFLEX MICROSCOPIC
Bilirubin Urine: NEGATIVE
Hgb urine dipstick: NEGATIVE
Ketones, ur: NEGATIVE
Leukocytes,Ua: NEGATIVE
Nitrite: NEGATIVE
RBC / HPF: NONE SEEN (ref 0–?)
Specific Gravity, Urine: 1.01 (ref 1.000–1.030)
Total Protein, Urine: NEGATIVE
Urine Glucose: NEGATIVE
Urobilinogen, UA: 0.2 (ref 0.0–1.0)
WBC, UA: NONE SEEN (ref 0–?)
pH: 6.5 (ref 5.0–8.0)

## 2021-06-13 LAB — BASIC METABOLIC PANEL
BUN: 10 mg/dL (ref 6–23)
CO2: 27 mEq/L (ref 19–32)
Calcium: 9 mg/dL (ref 8.4–10.5)
Chloride: 104 mEq/L (ref 96–112)
Creatinine, Ser: 0.69 mg/dL (ref 0.40–1.20)
GFR: 103.13 mL/min (ref >60.00)
Glucose, Bld: 95 mg/dL (ref 70–99)
Potassium: 4.4 mEq/L (ref 3.5–5.1)
Sodium: 139 mEq/L (ref 135–145)

## 2021-06-13 LAB — CBC
HCT: 42.5 % (ref 36.0–46.0)
Hemoglobin: 14 g/dL (ref 12.0–15.0)
MCHC: 33 g/dL (ref 30.0–36.0)
MCV: 88.3 fl (ref 78.0–100.0)
Platelets: 279 10*3/uL (ref 150.0–400.0)
RBC: 4.81 Mil/uL (ref 3.87–5.11)
RDW: 13.3 % (ref 11.5–15.5)
WBC: 6.9 10*3/uL (ref 4.0–10.5)

## 2021-06-13 LAB — C-REACTIVE PROTEIN: CRP: <1 mg/dL (ref 0.5–20.0)

## 2021-06-13 LAB — TSH: TSH: 1.9 u[IU]/mL (ref 0.35–5.50)

## 2021-06-13 LAB — SEDIMENTATION RATE: Sed Rate: 6 mm/hr (ref 0–20)

## 2021-06-13 MED ORDER — ESCITALOPRAM OXALATE 10 MG PO TABS: 10.0000 mg | ORAL_TABLET | Freq: Every day | ORAL | 1 refills | Status: DC

## 2021-06-13 MED ORDER — PREGABALIN 300 MG PO CAPS: 300.0000 mg | ORAL_CAPSULE | Freq: Two times a day (BID) | ORAL | 2 refills | Status: DC

## 2021-06-13 MED ORDER — BUPROPION HCL ER (SR) 150 MG PO TB12: 150.0000 mg | ORAL_TABLET | Freq: Every evening | ORAL | 1 refills | Status: DC

## 2021-06-13 MED ORDER — BENZTROPINE MESYLATE 1 MG PO TABS: 1.0000 mg | ORAL_TABLET | Freq: Two times a day (BID) | ORAL | 1 refills | Status: DC

## 2021-06-13 MED ORDER — LAMOTRIGINE 100 MG PO TABS: 100.0000 mg | ORAL_TABLET | Freq: Two times a day (BID) | ORAL | 1 refills | Status: DC

## 2021-06-13 MED ORDER — CARBAMAZEPINE ER 100 MG PO TB12: ORAL_TABLET | ORAL | 1 refills | Status: DC

## 2021-06-13 MED ORDER — HYDROXYZINE HCL 10 MG PO TABS: 10.0000 mg | ORAL_TABLET | Freq: Three times a day (TID) | ORAL | 1 refills | Status: DC | PRN

## 2021-06-13 MED ORDER — ARIPIPRAZOLE 2 MG PO TABS: 2.0000 mg | ORAL_TABLET | Freq: Every day | ORAL | 1 refills | Status: DC

## 2021-06-13 MED ORDER — LATUDA 20 MG PO TABS: ORAL_TABLET | ORAL | 1 refills | Status: DC

## 2021-06-13 NOTE — Progress Notes (Signed)
Chief Complaint  Patient presents with   Back Pain   Arm Pain   knot in thyroid area   HPI: [name] [name] is a 50 y.o. female, who is here today with above concerns.  "Bad" CP radiated to UE's, bilateral. She is very concerned about possibility of heart disease. Once per week. It is not exacerbated by exertion, usually at rest. Her heart rate goes up, she has not counted pulse, and feels "weak." She has been evaluated for similar symptoms in before. 10/2016 exercise stress test: Normal, it was determined that CP was not cardiac related. 01/2007 nuclear stress test: Negative.  She is on Propranolol 40 mg bid for HTN and sinus tachycardia. She is not checking BP.  "Kidney" "awful pain", low back pain L>R. Radiated to lower extremities. Occasionally perineal tingling. Negative for bladder/bowel dysfunction.  Exacerbated by movement. Hx of nephrolithiasis. She has not noted fever or abnormal wt loss.  She has not noted foam in urine or decreased urine output. States that "sometimes" she has gross hematuria.  Tylenol extra strength 2-3 tabs at bedtime.  She has not yet established with psychiatrist. She needs refills of her medications. Stopped taking her medications for a while, depression was worse, improved after resuming her medications. She lives with her parents. She is planning on starting seen a therapist, weekly. Tolerating medications well.  Currently she is on Lamictal 100 mg bid,Latuda 20 mg daily, Lexapro 10 mg daily,Carbamazepine XR 100 mg am and 200 mg pm,Wellbutrin SR 150 mg daily,Abilify 2 mg daily,and benztropine 1 mg bid.  Night pain, radiated to RUE, numbness and tingling.Intermittently for the past 4-5 months. She has daily "bad" pain. Anterior neck pain for about 8 months. She is concerned about thyroid mass. Pain is exacerbated by certain movements, worse with extension. She has not noted erythema,masses, or edema. No hx of  injuries.  Requesting referral to [facility], where she has been evaluated for back pain.  Fibromyalgia: She is on Lyrica 300 mg, 1-2 times daily. Medication still helps with pain but pain is still severe. + Fatigue.  Dysphagia "sometimes, this is a chronic problem. Reporting having a swallowing test 8 months ago and normal. Negative for odynophagia. + Tobacco use, trying to quit smoking.  Constipation: She has a bowel movement every "few days."She has to strain, exacerbating hemorrhoids. Linzess has helped in the past.  Review of Systems  Constitutional:  Positive for activity change and fatigue. Negative for appetite change and fever.  HENT:  Negative for mouth sores and nosebleeds.   Eyes:  Negative for redness and visual disturbance.  Respiratory:  Negative for cough, shortness of breath and wheezing.   Cardiovascular:  Positive for chest pain and palpitations. Negative for leg swelling.  Gastrointestinal:  Positive for abdominal pain (Mild,intermittent cramps alleviated by defecation.). Negative for nausea and vomiting.       Negative for changes in bowel habits.  Endocrine: Negative for cold intolerance and heat intolerance.  Genitourinary:  Positive for flank pain. Negative for frequency, pelvic pain and urgency.  Musculoskeletal:  Positive for back pain and myalgias. Negative for gait problem.  Skin:  Negative for pallor and rash.  Allergic/Immunologic: Positive for environmental allergies.  Neurological:  Negative for syncope, weakness and headaches.  Psychiatric/Behavioral:  Negative for confusion and hallucinations. The patient is nervous/anxious.   Rest see pertinent positives and negatives per HPI.  Current Outpatient Medications on File Prior to Visit  Medication Sig Dispense Refill   acetaminophen (TYLENOL)  500 MG tablet Take 1,000 mg by mouth every 6 (six) hours as needed for moderate pain.     cetirizine (ZYRTEC ALLERGY) 10 MG tablet Take 1 tablet (10 mg  total) by mouth daily. 10 tablet 0   EPINEPHrine 0.3 mg/0.3 mL IJ SOAJ injection Inject 0.3 mg into the muscle as needed for anaphylaxis. 2 each 0   Fluticasone-Salmeterol (ADVAIR DISKUS) 250-50 MCG/DOSE AEPB Inhale 1 puff into the lungs 2 (two) times daily. 1 each 3   montelukast (SINGULAIR) 5 MG chewable tablet Chew 1 tablet (5 mg total) by mouth at bedtime. 5 tablet 0   omeprazole (PRILOSEC) 40 MG capsule TAKE 1 CAPSULE BY MOUTH DAILY 90 capsule 3   propranolol (INDERAL) 40 MG tablet Take 1 tablet (40 mg total) by mouth 2 (two) times daily. 180 tablet 2   Spacer/Aero-Holding Chambers (AEROCHAMBER PLUS) inhaler Use as instructed to use with inahaler. 1 each 1   VENTOLIN HFA 108 (90 Base) MCG/ACT inhaler INHALE 2 PUFFS INTO THE LUNGS EVERY 6 HOURS AS NEEDED FOR WHEEZING OR SHORTNESS OF BREATH 18 g 1   No current facility-administered medications on file prior to visit.   Past Medical History:  Diagnosis Date   Anxiety    Bipolar disorder ([place])    Chronic kidney disease    kidney infections   Depression    Fibromyalgia    GERD (gastroesophageal reflux disease)    History of cervical dysplasia    History of exercise intolerance    normal ETT 10-24-2016   History of kidney stones    Hyperlipidemia    Hypertension    Migraines    Osteoarthritis    PCOS (polycystic ovarian syndrome)    Pneumonia 2018   Rash    in area of eswl 04-23-2017   Right ureteral calculus    Sleep apnea    Tachycardia cardiologist-  dr harding   controlled w/ metoprolol   Allergies  Allergen Reactions   Aspirin Shortness Of Breath and Other (See Comments)    Angiodema   Bee Venom Hives    Face swelling and chest pain   Diclofenac Anaphylaxis   Nsaids Anaphylaxis    Swelling of eyes mouth and throat difficulty breathing   Prednisone     Worsened depression, suicidal ideation    Terbinafine And Related     Worsened depression, suicidal ideation   Naproxen Swelling    Facial swelling    Social  History   Socioeconomic History   Marital status: Legally Separated    Spouse name: Not on file   Number of children: 2   Years of education: Not on file   Highest education level: Not on file  Occupational History   Occupation: Unemployed  Tobacco Use   Smoking status: Some Days    Packs/day: 0.50    Years: 25.00    Pack years: 12.50    Types: Cigarettes   Smokeless tobacco: Never   Tobacco comments:    6-7 cig. daily  Vaping Use   Vaping Use: Never used  Substance and Sexual Activity   Alcohol use: No   Drug use: No    Types: Cocaine    Comment: last cocaine use 2014   Sexual activity: Not Currently    Partners: Male    Birth control/protection: None  Other Topics Concern   Not on file  Social History Narrative   She lives at home with her spouse. She does not exercise.   She currently denies recent recreational  drug use.   Social Determinants of Health   Financial Resource Strain: Low Risk    Difficulty of Paying Living Expenses: Not hard at all  Food Insecurity: No Food Insecurity   Worried About Charity fundraiser in the Last Year: Never true   [place] in the Last Year: Never true  Transportation Needs: No Transportation Needs   Lack of Transportation (Medical): No   Lack of Transportation (Non-Medical): No  Physical Activity: Inactive   Days of Exercise per Week: 0 days   Minutes of Exercise per Session: 0 min  Stress: Stress Concern Present   Feeling of Stress : Rather much  Social Connections: Socially Isolated   Frequency of Communication with Friends and Family: Once a week   Frequency of Social Gatherings with Friends and Family: Never   Attends Religious Services: Never   Marine scientist or Organizations: No   Attends Archivist Meetings: Never   Marital Status: Separated   Vitals:   06/13/21 1050  BP: 120/70  Pulse: 70  Resp: 16  SpO2: 98%   Body mass index is 27.27 kg/m.  Physical Exam Vitals and nursing note  reviewed.  Constitutional:      General: She is not in acute distress.    Appearance: She is well-developed.  HENT:     Head: Normocephalic and atraumatic.     Mouth/Throat:     Mouth: Mucous membranes are moist.     Pharynx: Oropharynx is clear.  Eyes:     Conjunctiva/sclera: Conjunctivae normal.  Neck:     Thyroid: No thyroid mass.      Comments: Some tenderness upon palpation of area around sternocleidomastoid muscles. Cardiovascular:     Rate and Rhythm: Normal rate and regular rhythm.     Pulses:          Dorsalis pedis pulses are 2+ on the right side and 2+ on the left side.     Heart sounds: No murmur heard. Pulmonary:     Effort: Pulmonary effort is normal. No respiratory distress.     Breath sounds: Normal breath sounds.  Chest:     Chest wall: No tenderness.  Abdominal:     Palpations: Abdomen is soft. There is no hepatomegaly or mass.     Tenderness: There is no abdominal tenderness.  Musculoskeletal:     Cervical back: Tenderness present. No edema, erythema or bony tenderness. Pain with movement present.     Thoracic back: Tenderness present. No bony tenderness.     Lumbar back: Tenderness present. No bony tenderness. Negative right straight leg raise test and negative left straight leg raise test.       Back:     Comments: + Trigger tender points back,extremities,and chest wall.  Lymphadenopathy:     Cervical: No cervical adenopathy.  Skin:    General: Skin is warm.     Findings: No erythema or rash.  Neurological:     General: No focal deficit present.     Mental Status: She is alert and oriented to person, place, and time.     Cranial Nerves: No cranial nerve deficit.     Gait: Gait normal.  Psychiatric:        Mood and Affect: Mood is anxious.     Comments: Well groomed, good eye contact.   ASSESSMENT AND PLAN:  Ms. Hawley was seen today for back pain, arm pain and knot in thyroid area.  Diagnoses and all orders for  this visit: Orders Placed This  Encounter  Procedures   DG Abd 1 View   US THYROID   CBC   Basic metabolic panel   TSH   C-reactive protein   Urinalysis, Routine w reflex microscopic   Sedimentation rate   Ambulatory referral to Neurosurgery   Lab Results  Component Value Date   TSH 1.90 06/13/2021   Lab Results  Component Value Date   CREATININE 0.69 06/13/2021   BUN 10 06/13/2021   NA 139 06/13/2021   K 4.4 06/13/2021   CL 104 06/13/2021   CO2 27 06/13/2021   Lab Results  Component Value Date   CRP <1.0 06/13/2021   Lab Results  Component Value Date   ESRSEDRATE 6 06/13/2021   Lab Results  Component Value Date   WBC 6.9 06/13/2021   HGB 14.0 06/13/2021   HCT 42.5 06/13/2021   MCV 88.3 06/13/2021   PLT 279.0 06/13/2021   Cervical spondylosis with radiculopathy S/P anterior cervical discectomy. Continue following with neurosurgeon.  Constipation, unspecified constipation type Adequate fiber and fluid intake. Some of her medications can aggravate problem. Continue Linzess daily prn.  -     linaclotide (LINZESS) 145 MCG CAPS capsule; Take 1 capsule (145 mcg total) by mouth daily before breakfast.  Chest pain with low risk for cardiac etiology Hx and examination today does not suggest a serious process. Cardiac work up was negative a few years ago. EKG today: sinus arrhythmia,normal axis and intervals,?LAE. Compared with EKG 04/2018 with no significant changes. Clearly instructed about warning signs.  Fibromyalgia Some of her symptoms can be caused by this problem. Continue Lyrica same dose. Low impact exercise and good sleep hygiene.  -     pregabalin (LYRICA) 300 MG capsule; Take 1 capsule (300 mg total) by mouth 2 (two) times daily.  Anterior neck pain We discussed possible etiologies. On examination I do not appreciate masses or deformities. Could be related to fibromyalgia. Thyroid US will be arranged. Instructed about warning signs.  Gross hematuria Reporting problem as  happening "sometimes" for a while. Hx of nephrolithiasis and s/p hysterectomy. Smoking cessation encouraged. She is not longer following with urologist. Further recommendations according to lab/KUB results.  Lumbar radiculopathy Problem is getting worse. Referral to neurosurgeon placed as requested.  Bipolar affective disorder, current episode depressed, current episode severity unspecified ([place]) Medications sent today+1 refill. Establishing with psychiatrist.  Essential hypertension BP adequately controlled. Continue current management: Propranolol 40 mg bid. DASH/low salt diet recommended. Monitor BP at home. Eye exam recommended annually.  Sinus tachycardia Having a few episodes. Recommend monitoring HR when she has episodes. Continue Propranolol 40 mg bid. Instructed about warning signs.  Borderline personality disorder in adult [facility]) No changes in current medications. We discussed some side effects and the risk of med interaction.  -     lurasidone (LATUDA) 20 MG TABS tablet; TAKE 1 TABLET BY MOUTH EACH NIGHT AT BEDTIME -     escitalopram (LEXAPRO) 10 MG tablet; Take 1 tablet (10 mg total) by mouth at bedtime. -     lamoTRIgine (LAMICTAL) 100 MG tablet; Take 1 tablet (100 mg total) by mouth 2 (two) times daily. -     ARIPiprazole (ABILIFY) 2 MG tablet; Take 1 tablet (2 mg total) by mouth daily. -     benztropine (COGENTIN) 1 MG tablet; Take 1 tablet (1 mg total) by mouth 2 (two) times daily. -     carbamazepine (TEGRETOL XR) 100 MG 12 hr tablet; Take 100  mg in the morning and 200 mg at night -     buPROPion (WELLBUTRIN SR) 150 MG 12 hr tablet; Take 1 tablet (150 mg total) by mouth every evening. -     hydrOXYzine (ATARAX/VISTARIL) 10 MG tablet; Take 1 tablet (10 mg total) by mouth 3 (three) times daily as needed for anxiety.  I spent a total of 43 minutes in both face to face and non face to face activities for this visit on the date of this encounter. During this time  history was obtained and documented, examination was performed, prior labs/imaging reviewed, and assessment/plan discussed.  Return in about 3 months (around 09/13/2021).   Lena Fieldhouse G. [place], MD  [facility]. [place] office.

## 2021-06-13 NOTE — Patient Instructions (Addendum)
A few things to remember from today's visit:   Chest pain with low risk for cardiac etiology  Borderline personality disorder in adult [facility]) - Plan: lurasidone (LATUDA) 20 MG TABS tablet, escitalopram (LEXAPRO) 10 MG tablet, lamoTRIgine (LAMICTAL) 100 MG tablet, ARIPiprazole (ABILIFY) 2 MG tablet, benztropine (COGENTIN) 1 MG tablet, carbamazepine (TEGRETOL XR) 100 MG 12 hr tablet, buPROPion (WELLBUTRIN SR) 150 MG 12 hr tablet, hydrOXYzine (ATARAX/VISTARIL) 10 MG tablet  Fibromyalgia - Plan: pregabalin (LYRICA) 300 MG capsule  Fibromyalgia affecting multiple sites  Cervical spondylosis with radiculopathy - Plan: Ambulatory referral to Neurosurgery  Anterior neck pain - Plan: CBC, Basic metabolic panel, TSH, C-reactive protein, Sedimentation rate  Gross hematuria - Plan: CBC, Urinalysis, Routine w reflex microscopic, DG Abd 1 View  Lumbar radiculopathy - Plan: Ambulatory referral to Neurosurgery  If you need refills please call your pharmacy. Do not use My Chart to request refills or for acute issues that need immediate attention.   Chest pain does not suggest cardiac etiology.We can still arrange appt with cardiologist, who you have seen before for chest pain.  Next prescriptions for depression and bipolar from your psychiatrist. No changes in Lyrica.  Thyroid ultrasound will be arranged.  Please be sure medication list is accurate. If a new problem present, please set up appointment sooner than planned today.

## 2021-06-14 ENCOUNTER — Ambulatory Visit (INDEPENDENT_AMBULATORY_CARE_PROVIDER_SITE_OTHER)
Admission: RE | Admit: 2021-06-14 | Discharge: 2021-06-14 | Disposition: A | Discharge status: Home / Self Care | Payer: Medicare HMO | Source: Ambulatory Visit | Attending: Family Medicine | Admitting: Family Medicine

## 2021-06-14 DIAGNOSIS — R310 Gross hematuria: Secondary | ICD-10-CM | POA: Diagnosis not present

## 2021-06-14 DIAGNOSIS — R109 Unspecified abdominal pain: Secondary | ICD-10-CM | POA: Diagnosis not present

## 2021-06-14 DIAGNOSIS — G4733 Obstructive sleep apnea (adult) (pediatric): Secondary | ICD-10-CM | POA: Diagnosis not present

## 2021-06-15 ENCOUNTER — Other Ambulatory Visit: Payer: Self-pay | Admitting: Family Medicine

## 2021-06-15 ENCOUNTER — Other Ambulatory Visit: Payer: Medicare HMO

## 2021-06-15 DIAGNOSIS — N6001 Solitary cyst of right breast: Secondary | ICD-10-CM

## 2021-06-16 ENCOUNTER — Telehealth: Payer: Self-pay | Admitting: Family Medicine

## 2021-06-16 MED ORDER — LINACLOTIDE 145 MCG PO CAPS: 145.0000 ug | ORAL_CAPSULE | Freq: Every day | ORAL | 1 refills | Status: DC

## 2021-06-16 NOTE — Telephone Encounter (Signed)
The patient called for X-Ray results from 07/26  Please advise

## 2021-06-17 NOTE — Telephone Encounter (Signed)
Patient viewed results & comments from provider.

## 2021-06-29 DIAGNOSIS — F411 Generalized anxiety disorder: Secondary | ICD-10-CM | POA: Diagnosis not present

## 2021-06-30 ENCOUNTER — Other Ambulatory Visit: Payer: Self-pay

## 2021-06-30 ENCOUNTER — Ambulatory Visit
Admission: RE | Admit: 2021-06-30 | Discharge: 2021-06-30 | Disposition: A | Discharge status: Home / Self Care | Payer: Medicare HMO | Source: Ambulatory Visit | Attending: Family Medicine | Admitting: Family Medicine

## 2021-06-30 DIAGNOSIS — M542 Cervicalgia: Secondary | ICD-10-CM | POA: Diagnosis not present

## 2021-07-20 ENCOUNTER — Other Ambulatory Visit: Payer: Medicare HMO

## 2021-07-27 DIAGNOSIS — F315 Bipolar disorder, current episode depressed, severe, with psychotic features: Secondary | ICD-10-CM | POA: Diagnosis not present

## 2021-07-28 ENCOUNTER — Other Ambulatory Visit: Payer: Self-pay | Admitting: Family Medicine

## 2021-07-28 DIAGNOSIS — M797 Fibromyalgia: Secondary | ICD-10-CM

## 2021-07-28 NOTE — Telephone Encounter (Signed)
Stephanie from [facility] called to request the following meds to be refilled:  Omeprazole (PRILOSEC) 40 MG capsule pregabalin (LYRICA) 300 MG capsule

## 2021-07-29 MED ORDER — OMEPRAZOLE 40 MG PO CPDR: 40.0000 mg | DELAYED_RELEASE_CAPSULE | Freq: Every day | ORAL | 1 refills | Status: DC

## 2021-07-29 NOTE — Addendum Note (Signed)
Addended by: Rodrigo Ran on: 07/29/2021 07:14 AM   Modules accepted: Orders

## 2021-08-01 ENCOUNTER — Other Ambulatory Visit: Payer: Self-pay

## 2021-08-01 DIAGNOSIS — M797 Fibromyalgia: Secondary | ICD-10-CM

## 2021-08-01 NOTE — Telephone Encounter (Signed)
Patient called requesting Rx refill on pregabalin (LYRICA) 300 MG capsule Sent to [facility] 90 day supply

## 2021-08-01 NOTE — Addendum Note (Signed)
Addended by: Nathanial Millman E on: 08/01/2021 03:09 PM   Modules accepted: Orders

## 2021-08-02 NOTE — Telephone Encounter (Signed)
According to [place] controlled sub report she still has 2 Rx available at her pharmacy, sent in 05/2021. Thanks, BJ

## 2021-08-03 MED ORDER — PREGABALIN 300 MG PO CAPS: 300.0000 mg | ORAL_CAPSULE | Freq: Two times a day (BID) | ORAL | 1 refills | Status: DC

## 2021-08-03 NOTE — Telephone Encounter (Signed)
It needs to be sent into her mail order - I will cancel the other Rx's at the local pharmacy.

## 2021-08-18 DIAGNOSIS — F315 Bipolar disorder, current episode depressed, severe, with psychotic features: Secondary | ICD-10-CM | POA: Diagnosis not present

## 2021-08-31 NOTE — Progress Notes (Signed)
error 

## 2021-09-08 DIAGNOSIS — F315 Bipolar disorder, current episode depressed, severe, with psychotic features: Secondary | ICD-10-CM | POA: Diagnosis not present

## 2021-09-13 ENCOUNTER — Ambulatory Visit: Payer: Medicare HMO | Admitting: Family Medicine

## 2021-10-06 DIAGNOSIS — F315 Bipolar disorder, current episode depressed, severe, with psychotic features: Secondary | ICD-10-CM | POA: Diagnosis not present

## 2021-10-08 ENCOUNTER — Other Ambulatory Visit: Payer: Self-pay | Admitting: Family Medicine

## 2021-10-08 DIAGNOSIS — M797 Fibromyalgia: Secondary | ICD-10-CM

## 2021-10-08 DIAGNOSIS — F603 Borderline personality disorder: Secondary | ICD-10-CM

## 2021-10-10 NOTE — Telephone Encounter (Signed)
-   last filled 09/12/21 - last OV 05/2021

## 2021-10-17 ENCOUNTER — Telehealth: Payer: Self-pay

## 2021-10-17 DIAGNOSIS — F603 Borderline personality disorder: Secondary | ICD-10-CM

## 2021-10-17 NOTE — Telephone Encounter (Signed)
Patient called asking if Dr Martinique will refill Rx one more time patient stated she has an appt with [facility] 12/8 and is completely out   lurasidone (LATUDA) 20 MG TABS tablet escitalopram (LEXAPRO) 10 MG tablet buPROPion (WELLBUTRIN SR) 150 MG 12 hr tablet ARIPiprazole (ABILIFY) 2 MG tablet lamoTRIgine (LAMICTAL) 100 MG tablet benztropine (COGENTIN) 1 MG tablet

## 2021-10-18 DIAGNOSIS — F603 Borderline personality disorder: Secondary | ICD-10-CM | POA: Diagnosis not present

## 2021-10-18 MED ORDER — ARIPIPRAZOLE 2 MG PO TABS: 2.0000 mg | ORAL_TABLET | Freq: Every day | ORAL | 0 refills | Status: DC

## 2021-10-18 MED ORDER — LAMOTRIGINE 100 MG PO TABS: 100.0000 mg | ORAL_TABLET | Freq: Two times a day (BID) | ORAL | 0 refills | Status: DC

## 2021-10-18 MED ORDER — BUPROPION HCL ER (SR) 150 MG PO TB12: 150.0000 mg | ORAL_TABLET | Freq: Every evening | ORAL | 0 refills | Status: DC

## 2021-10-18 MED ORDER — BENZTROPINE MESYLATE 1 MG PO TABS: 1.0000 mg | ORAL_TABLET | Freq: Two times a day (BID) | ORAL | 0 refills | Status: DC

## 2021-10-18 MED ORDER — LATUDA 20 MG PO TABS: ORAL_TABLET | ORAL | 0 refills | Status: DC

## 2021-10-18 MED ORDER — ESCITALOPRAM OXALATE 10 MG PO TABS: 10.0000 mg | ORAL_TABLET | Freq: Every day | ORAL | 0 refills | Status: DC

## 2021-10-18 NOTE — Addendum Note (Signed)
Addended by: Rodrigo Ran on: 10/18/2021 07:42 AM   Modules accepted: Orders

## 2021-10-18 NOTE — Telephone Encounter (Signed)
I spoke with pcp, okay to send 10 days in for each medication. Rx's sent in to patient's pharmacy.

## 2021-10-19 ENCOUNTER — Other Ambulatory Visit: Payer: Self-pay | Admitting: Family Medicine

## 2021-10-19 DIAGNOSIS — F603 Borderline personality disorder: Secondary | ICD-10-CM

## 2021-10-20 NOTE — Telephone Encounter (Signed)
Patient is requesting for the prescriptions to be sent to [facility] for [facility].

## 2021-10-21 NOTE — Telephone Encounter (Signed)
Rx's resent.  

## 2021-11-02 DIAGNOSIS — F603 Borderline personality disorder: Secondary | ICD-10-CM | POA: Diagnosis not present

## 2021-12-09 DIAGNOSIS — F332 Major depressive disorder, recurrent severe without psychotic features: Secondary | ICD-10-CM | POA: Diagnosis not present

## 2021-12-09 DIAGNOSIS — F4312 Post-traumatic stress disorder, chronic: Secondary | ICD-10-CM | POA: Diagnosis not present

## 2021-12-14 ENCOUNTER — Other Ambulatory Visit: Payer: Self-pay | Admitting: Family Medicine

## 2021-12-14 DIAGNOSIS — I10 Essential (primary) hypertension: Secondary | ICD-10-CM

## 2021-12-14 DIAGNOSIS — M797 Fibromyalgia: Secondary | ICD-10-CM

## 2021-12-14 NOTE — Telephone Encounter (Signed)
Patient called to get temporary refill on pregabalin (LYRICA) 300 MG capsule and propranolol (INDERAL) 40 MG tablet Patient is scheduled to come in office on 02/01 and wants just enough to last her until the appointment if possible.    Please send to  [facility], [address] Phone:  [phone]  Fax:  [phone]       Please advise

## 2021-12-15 ENCOUNTER — Other Ambulatory Visit: Payer: Self-pay | Admitting: Family Medicine

## 2021-12-15 DIAGNOSIS — M797 Fibromyalgia: Secondary | ICD-10-CM

## 2021-12-16 DIAGNOSIS — F4312 Post-traumatic stress disorder, chronic: Secondary | ICD-10-CM | POA: Diagnosis not present

## 2021-12-16 DIAGNOSIS — F332 Major depressive disorder, recurrent severe without psychotic features: Secondary | ICD-10-CM | POA: Diagnosis not present

## 2021-12-19 ENCOUNTER — Other Ambulatory Visit: Payer: Medicare HMO

## 2021-12-19 ENCOUNTER — Other Ambulatory Visit: Payer: Self-pay | Admitting: Family Medicine

## 2021-12-19 DIAGNOSIS — M797 Fibromyalgia: Secondary | ICD-10-CM

## 2021-12-19 NOTE — Telephone Encounter (Signed)
Pt is aware dr Martinique out of office until 12-20-2021. Pt has an appt with dr Martinique on 12-21-2021

## 2021-12-20 DIAGNOSIS — F315 Bipolar disorder, current episode depressed, severe, with psychotic features: Secondary | ICD-10-CM | POA: Diagnosis not present

## 2021-12-20 NOTE — Progress Notes (Deleted)
HPI:  [name] [name] is a 51 y.o. female, who is here today to follow on recent visit.  Review of Systems Rest see pertinent positives and negatives per HPI.  Current Outpatient Medications on File Prior to Visit  Medication Sig Dispense Refill   acetaminophen (TYLENOL) 500 MG tablet Take 1,000 mg by mouth every 6 (six) hours as needed for moderate pain.     ARIPiprazole (ABILIFY) 2 MG tablet TAKE 1 TABLET EVERY DAY 10 tablet 0   benztropine (COGENTIN) 1 MG tablet TAKE 1 TABLET TWICE DAILY 20 tablet 0   buPROPion (WELLBUTRIN SR) 150 MG 12 hr tablet TAKE 1 TABLET EVERY EVENING 10 tablet 0   carbamazepine (TEGRETOL XR) 100 MG 12 hr tablet Take 100 mg in the morning and 200 mg at night 90 tablet 1   cetirizine (ZYRTEC ALLERGY) 10 MG tablet Take 1 tablet (10 mg total) by mouth daily. 10 tablet 0   EPINEPHrine 0.3 mg/0.3 mL IJ SOAJ injection Inject 0.3 mg into the muscle as needed for anaphylaxis. 2 each 0   escitalopram (LEXAPRO) 10 MG tablet TAKE 1 TABLET AT BEDTIME 10 tablet 0   Fluticasone-Salmeterol (ADVAIR DISKUS) 250-50 MCG/DOSE AEPB Inhale 1 puff into the lungs 2 (two) times daily. 1 each 3   hydrOXYzine (ATARAX/VISTARIL) 10 MG tablet Take 1 tablet (10 mg total) by mouth 3 (three) times daily as needed for anxiety. 30 tablet 1   lamoTRIgine (LAMICTAL) 100 MG tablet TAKE 1 TABLET TWICE DAILY 20 tablet 0   linaclotide (LINZESS) 145 MCG CAPS capsule Take 1 capsule (145 mcg total) by mouth daily before breakfast. 90 capsule 1   lurasidone (LATUDA) 20 MG TABS tablet TAKE 1 TABLET AT BEDTIME 10 tablet 0   montelukast (SINGULAIR) 5 MG chewable tablet Chew 1 tablet (5 mg total) by mouth at bedtime. 5 tablet 0   omeprazole (PRILOSEC) 40 MG capsule Take 1 capsule (40 mg total) by mouth daily. 90 capsule 1   pregabalin (LYRICA) 300 MG capsule TAKE 1 CAPSULE TWICE DAILY 60 capsule 0   propranolol (INDERAL) 40 MG tablet Take 1 tablet (40 mg total) by mouth 2 (two) times daily. 180 tablet 2    Spacer/Aero-Holding Chambers (AEROCHAMBER PLUS) inhaler Use as instructed to use with inahaler. 1 each 1   VENTOLIN HFA 108 (90 Base) MCG/ACT inhaler INHALE 2 PUFFS INTO THE LUNGS EVERY 6 HOURS AS NEEDED FOR WHEEZING OR SHORTNESS OF BREATH 18 g 1   No current facility-administered medications on file prior to visit.    Past Medical History:  Diagnosis Date   Anxiety    Bipolar disorder ([place])    Chronic kidney disease    kidney infections   Depression    Fibromyalgia    GERD (gastroesophageal reflux disease)    History of cervical dysplasia    History of exercise intolerance    normal ETT 10-24-2016   History of kidney stones    Hyperlipidemia    Hypertension    Migraines    Osteoarthritis    PCOS (polycystic ovarian syndrome)    Pneumonia 2018   Rash    in area of eswl 04-23-2017   Right ureteral calculus    Sleep apnea    Tachycardia cardiologist-  dr harding   controlled w/ metoprolol   Allergies  Allergen Reactions   Aspirin Shortness Of Breath and Other (See Comments)    Angiodema   Bee Venom Hives    Face swelling and chest pain  Diclofenac Anaphylaxis   Nsaids Anaphylaxis    Swelling of eyes mouth and throat difficulty breathing   Prednisone     Worsened depression, suicidal ideation    Terbinafine And Related     Worsened depression, suicidal ideation   Naproxen Swelling    Facial swelling    Social History   Socioeconomic History   Marital status: Legally Separated    Spouse name: Not on file   Number of children: 2   Years of education: Not on file   Highest education level: Not on file  Occupational History   Occupation: Unemployed  Tobacco Use   Smoking status: Some Days    Packs/day: 0.50    Years: 25.00    Pack years: 12.50    Types: Cigarettes   Smokeless tobacco: Never   Tobacco comments:    6-7 cig. daily  Vaping Use   Vaping Use: Never used  Substance and Sexual Activity   Alcohol use: No   Drug use: No    Types: Cocaine     Comment: last cocaine use 2014   Sexual activity: Not Currently    Partners: Male    Birth control/protection: None  Other Topics Concern   Not on file  Social History Narrative   She lives at home with her spouse. She does not exercise.   She currently denies recent recreational drug use.   Social Determinants of Health   Financial Resource Strain: Low Risk    Difficulty of Paying Living Expenses: Not hard at all  Food Insecurity: No Food Insecurity   Worried About Charity fundraiser in the Last Year: Never true   [place] in the Last Year: Never true  Transportation Needs: No Transportation Needs   Lack of Transportation (Medical): No   Lack of Transportation (Non-Medical): No  Physical Activity: Inactive   Days of Exercise per Week: 0 days   Minutes of Exercise per Session: 0 min  Stress: Stress Concern Present   Feeling of Stress : Rather much  Social Connections: Socially Isolated   Frequency of Communication with Friends and Family: Once a week   Frequency of Social Gatherings with Friends and Family: Never   Attends Religious Services: Never   Marine scientist or Organizations: No   Attends Archivist Meetings: Never   Marital Status: Separated    There were no vitals filed for this visit. There is no height or weight on file to calculate BMI.  Physical Exam  ASSESSMENT AND PLAN:   There are no diagnoses linked to this encounter.  No orders of the defined types were placed in this encounter.   No problem-specific Assessment & Plan notes found for this encounter.   No follow-ups on file.   [name]. Martinique, MD  [facility]. [place] office.

## 2021-12-21 ENCOUNTER — Ambulatory Visit: Payer: Medicare HMO | Admitting: Family Medicine

## 2021-12-24 ENCOUNTER — Other Ambulatory Visit: Payer: Self-pay | Admitting: Family Medicine

## 2021-12-26 ENCOUNTER — Ambulatory Visit: Payer: Medicare HMO | Admitting: Family Medicine

## 2021-12-26 NOTE — Progress Notes (Signed)
Chief Complaint  Patient presents with   medication refill    Pt needs Lyrica, Linzess and Propanolol refilled.   HPI: [name] is a 51 y.o. female, who is here today  for follow up. She was last seen on 06/13/21. Since her last visit she has established with psychiatrist, she has seen him x 2. She has all her meds adjusted and some have been discontinued. CBT weekly.  -Fibromyalgia on Lyrica 300 mg bid. Medication is still helping. RLE pain mainly at night, severe. No claudication like symptoms. Negative for edema or erythema.  Upper and lower back pain, the latter one associated with right hip pain and numbness. This is a chronic problem, had lumbar MRI in 05/13/21 and currently following with neurosurgeon.  -Constipation:She takes a stool softener every night, stool is soft but she has difficulty passing stool, so sometimes she needs to remove stool manually. She took linzess before, it caused diarrhea but felt like it helped.She would like a Rx.  She has followed with GI, we have a copy of her last colonoscopy but she denies having one. 05/20/2019 colonoscopy: - One 7 mm sessile polyp in the proximal ascending colon, removed with a cold snare x 1;resected and retrieved. - One 6 mm sessile polyp in the mid descending colon, removed with a cold snare x 1;resected and retrieved. - The examined portion of the ileum was normal. - Small internal hemorrhoids.  Negative for abdominal pain, nausea, vomiting, blood in stool or melena.   Elevated HR, she takes Propranolol 40 mg bid prn for sinus tachycardia.Ran out of medication a few days ago. She was donating plasma yesterday and HR was 103/min.  She does not check BP regularly. Negative for severe/frequent headache, visual changes, chest pain, dyspnea, focal weakness, or edema.  Lab Results  Component Value Date   CREATININE 0.69 06/13/2021   BUN 10 06/13/2021   NA 139 06/13/2021   K 4.4 06/13/2021   CL 104  06/13/2021   CO2 27 06/13/2021   GERD symptoms have improved with dietary changes, she is not taking omeprazole daily.  -Left knee pain, would like ortho referral. Fell 6 months ago, landed on left knee, since then she has had severe pain when "something pushes on it." Severe pain on anterior and under knee. She has not noted effusion,deformity,limitation of ROM,or erythema. She is taking Tylenol.  Review of Systems  Constitutional:  Positive for fatigue. Negative for activity change and appetite change.  HENT:  Negative for nosebleeds and sore throat.   Respiratory:  Negative for cough and wheezing.   Genitourinary:  Negative for decreased urine volume, dysuria and hematuria.  Musculoskeletal:  Positive for arthralgias, back pain and myalgias. Negative for gait problem.  Neurological:  Negative for syncope, facial asymmetry and weakness.  Psychiatric/Behavioral:  Positive for sleep disturbance. Negative for confusion. The patient is nervous/anxious.   Rest see pertinent positives and negatives per HPI.  Current Outpatient Medications on File Prior to Visit  Medication Sig Dispense Refill   acetaminophen (TYLENOL) 500 MG tablet Take 1,000 mg by mouth every 6 (six) hours as needed for moderate pain.     buPROPion (WELLBUTRIN SR) 150 MG 12 hr tablet TAKE 1 TABLET EVERY EVENING 10 tablet 0   EPINEPHrine 0.3 mg/0.3 mL IJ SOAJ injection Inject 0.3 mg into the muscle as needed for anaphylaxis. 2 each 0   escitalopram (LEXAPRO) 10 MG tablet TAKE 1 TABLET AT BEDTIME 10 tablet 0   hydrOXYzine (ATARAX/VISTARIL) 10 MG  tablet Take 1 tablet (10 mg total) by mouth 3 (three) times daily as needed for anxiety. 30 tablet 1   lamoTRIgine (LAMICTAL) 100 MG tablet TAKE 1 TABLET TWICE DAILY 20 tablet 0   lurasidone (LATUDA) 20 MG TABS tablet TAKE 1 TABLET AT BEDTIME 10 tablet 0   omeprazole (PRILOSEC) 40 MG capsule TAKE 1 CAPSULE EVERY DAY 90 capsule 1   VENTOLIN HFA 108 (90 Base) MCG/ACT inhaler INHALE 2  PUFFS INTO THE LUNGS EVERY 6 HOURS AS NEEDED FOR WHEEZING OR SHORTNESS OF BREATH 18 g 1   No current facility-administered medications on file prior to visit.   Past Medical History:  Diagnosis Date   Anxiety    Bipolar disorder ([place])    Chronic kidney disease    kidney infections   Depression    Fibromyalgia    GERD (gastroesophageal reflux disease)    History of cervical dysplasia    History of exercise intolerance    normal ETT 10-24-2016   History of kidney stones    Hyperlipidemia    Hypertension    Migraines    Osteoarthritis    PCOS (polycystic ovarian syndrome)    Pneumonia 2018   Rash    in area of eswl 04-23-2017   Right ureteral calculus    Sleep apnea    Tachycardia cardiologist-  dr harding   controlled w/ metoprolol   Allergies  Allergen Reactions   Aspirin Shortness Of Breath and Other (See Comments)    Angiodema   Bee Venom Hives    Face swelling and chest pain   Diclofenac Anaphylaxis   Nsaids Anaphylaxis    Swelling of eyes mouth and throat difficulty breathing   Prednisone     Worsened depression, suicidal ideation    Terbinafine And Related     Worsened depression, suicidal ideation   Naproxen Swelling    Facial swelling   Social History   Socioeconomic History   Marital status: Legally Separated    Spouse name: Not on file   Number of children: 2   Years of education: Not on file   Highest education level: Not on file  Occupational History   Occupation: Unemployed  Tobacco Use   Smoking status: Some Days    Packs/day: 0.50    Years: 25.00    Pack years: 12.50    Types: Cigarettes   Smokeless tobacco: Never   Tobacco comments:    6-7 cig. daily  Vaping Use   Vaping Use: Never used  Substance and Sexual Activity   Alcohol use: No   Drug use: No    Types: Cocaine    Comment: last cocaine use 2014   Sexual activity: Not Currently    Partners: Male    Birth control/protection: None  Other Topics Concern   Not on file  Social  History Narrative   She lives at home with her spouse. She does not exercise.   She currently denies recent recreational drug use.   Social Determinants of Health   Financial Resource Strain: Low Risk    Difficulty of Paying Living Expenses: Not hard at all  Food Insecurity: No Food Insecurity   Worried About Charity fundraiser in the Last Year: Never true   [place] in the Last Year: Never true  Transportation Needs: No Transportation Needs   Lack of Transportation (Medical): No   Lack of Transportation (Non-Medical): No  Physical Activity: Inactive   Days of Exercise per Week: 0 days  Minutes of Exercise per Session: 0 min  Stress: Stress Concern Present   Feeling of Stress : Rather much  Social Connections: Socially Isolated   Frequency of Communication with Friends and Family: Once a week   Frequency of Social Gatherings with Friends and Family: Never   Attends Religious Services: Never   Marine scientist or Organizations: No   Attends Archivist Meetings: Never   Marital Status: Separated   Vitals:   12/28/21 0807  BP: (!) 148/90  Pulse: 76  Resp: 16  Temp: (!) 97.2 F (36.2 C)  SpO2: 98%   Body mass index is 27.88 kg/m.  Physical Exam Vitals and nursing note reviewed.  Constitutional:      General: She is not in acute distress.    Appearance: She is well-developed.  HENT:     Head: Normocephalic and atraumatic.     Mouth/Throat:     Mouth: Mucous membranes are moist.     Pharynx: Oropharynx is clear.  Eyes:     Conjunctiva/sclera: Conjunctivae normal.  Cardiovascular:     Rate and Rhythm: Normal rate and regular rhythm.     Pulses:          Dorsalis pedis pulses are 2+ on the right side and 2+ on the left side.     Heart sounds: No murmur heard. Pulmonary:     Effort: Pulmonary effort is normal. No respiratory distress.     Breath sounds: Normal breath sounds.  Abdominal:     Palpations: Abdomen is soft. There is no  hepatomegaly or mass.     Tenderness: There is no abdominal tenderness.  Musculoskeletal:     Cervical back: Tenderness present. No bony tenderness.     Thoracic back: Tenderness present.     Lumbar back: Tenderness present.     Left knee: Crepitus present. No bony tenderness. Normal range of motion. Tenderness present over the patellar tendon.     Comments: Trigger points tenderness on back and LE's.  Lymphadenopathy:     Cervical: No cervical adenopathy.  Skin:    General: Skin is warm.     Findings: No erythema or rash.  Neurological:     General: No focal deficit present.     Mental Status: She is alert and oriented to person, place, and time.     Cranial Nerves: No cranial nerve deficit.     Gait: Gait normal.  Psychiatric:        Mood and Affect: Mood is anxious.     Comments: Well groomed, good eye contact.   ASSESSMENT AND PLAN:  Ms.Tykira was seen today for medication refill.  Diagnoses and all orders for this visit:  Left knee pain, unspecified chronicity Reported as severe and new after fall 6 months ago. Avoid activities that can aggravate pain. Tylenol 500 mg 3-4 times per day to continue. Ortho referral placed.  Essential hypertension BP mildly elevated. Instructed to take Propranolol 40 mg daily bid instead prn. Monitor BP at home. Low salt/DASH diet.  Constipation Bisacodyl 10 mg supp recommended. Continue Linzess 145 mcg daily as needed. Adequate fiber and water intake.  Fibromyalgia Problem is stable. Lyrica is still helping, continue 300 mg bid. We discussed some side effects. Low impact exercise and good sleep hygiene.  Sinus tachycardia Mild. Reporting HR's of 102-103/min. Continue monitoring HR at home. Propranolol 40 mg bid resumed today.  Affective psychosis, bipolar ([place]) Continue following with psychiatrist and psychotherapist.  I spent a total of 45  minutes in both face to face and non face to face activities for this visit on  the date of this encounter. During this time history was obtained and documented, examination was performed, prior labs/imaging reviewed, and assessment/plan discussed.  Return in about 3 months (around 03/27/2022) for Fibromyalgia,constipation,and HTN.  Lalo Tromp G. Martinique, MD  [facility]. [place] office.

## 2021-12-27 ENCOUNTER — Ambulatory Visit: Payer: Medicare HMO | Admitting: Family Medicine

## 2021-12-28 ENCOUNTER — Ambulatory Visit (INDEPENDENT_AMBULATORY_CARE_PROVIDER_SITE_OTHER): Payer: Medicare HMO | Admitting: Family Medicine

## 2021-12-28 ENCOUNTER — Telehealth: Payer: Self-pay | Admitting: Family Medicine

## 2021-12-28 ENCOUNTER — Encounter: Payer: Self-pay | Admitting: Family Medicine

## 2021-12-28 VITALS — BP 148/90 | HR 76 | Temp 97.2°F | Resp 16 | Ht 67.0 in | Wt 178.0 lb

## 2021-12-28 DIAGNOSIS — K5900 Constipation, unspecified: Secondary | ICD-10-CM

## 2021-12-28 DIAGNOSIS — R000 Tachycardia, unspecified: Secondary | ICD-10-CM | POA: Diagnosis not present

## 2021-12-28 DIAGNOSIS — F3130 Bipolar disorder, current episode depressed, mild or moderate severity, unspecified: Secondary | ICD-10-CM | POA: Diagnosis not present

## 2021-12-28 DIAGNOSIS — M25562 Pain in left knee: Secondary | ICD-10-CM | POA: Diagnosis not present

## 2021-12-28 DIAGNOSIS — I10 Essential (primary) hypertension: Secondary | ICD-10-CM

## 2021-12-28 DIAGNOSIS — M797 Fibromyalgia: Secondary | ICD-10-CM | POA: Diagnosis not present

## 2021-12-28 MED ORDER — BISACODYL 10 MG RE SUPP: 10.0000 mg | Freq: Every day | RECTAL | 1 refills | Status: DC | PRN

## 2021-12-28 MED ORDER — PREGABALIN 300 MG PO CAPS: 300.0000 mg | ORAL_CAPSULE | Freq: Two times a day (BID) | ORAL | 0 refills | Status: DC

## 2021-12-28 MED ORDER — LINACLOTIDE 145 MCG PO CAPS: 145.0000 ug | ORAL_CAPSULE | Freq: Every day | ORAL | 0 refills | Status: DC | PRN

## 2021-12-28 MED ORDER — PROPRANOLOL HCL 40 MG PO TABS: 40.0000 mg | ORAL_TABLET | Freq: Two times a day (BID) | ORAL | 2 refills | Status: DC

## 2021-12-28 MED ORDER — PREGABALIN 300 MG PO CAPS: 300.0000 mg | ORAL_CAPSULE | Freq: Two times a day (BID) | ORAL | 3 refills | Status: DC

## 2021-12-28 NOTE — Assessment & Plan Note (Signed)
Bisacodyl 10 mg supp recommended. Continue Linzess 145 mcg daily as needed. Adequate fiber and water intake.

## 2021-12-28 NOTE — Assessment & Plan Note (Signed)
Mild. Reporting HR's of 102-103/min. Continue monitoring HR at home. Propranolol 40 mg bid resumed today.

## 2021-12-28 NOTE — Assessment & Plan Note (Signed)
BP mildly elevated. Instructed to take Propranolol 40 mg daily bid instead prn. Monitor BP at home. Low salt/DASH diet.

## 2021-12-28 NOTE — Assessment & Plan Note (Deleted)
102-103/min reported. Continue monitoring HR at home. Propranolol 40 mg bid resumed today.

## 2021-12-28 NOTE — Telephone Encounter (Signed)
Patient called because it will be a week until she will get her mail order prescription for pregabalin (LYRICA) 300 MG capsule. Patient stated that she had thought a partial prescription had been shipped last week, but was incorrect. She wanted to know if a temporary refill can be sent in to last her until her mail order is received.  Please send to  [facility], [address] Phone:  [phone]  Fax:  [phone]       Please advise

## 2021-12-28 NOTE — Patient Instructions (Signed)
A few things to remember from today's visit:   Left knee pain, unspecified chronicity - Plan: Ambulatory referral to Orthopedic Surgery  Constipation, unspecified constipation type - Plan: linaclotide (LINZESS) 145 MCG CAPS capsule  Fibromyalgia - Plan: pregabalin (LYRICA) 300 MG capsule  Essential hypertension - Plan: propranolol (INDERAL) 40 MG tablet  If you need refills please call your pharmacy. Do not use My Chart to request refills or for acute issues that need immediate attention.   Take Propranolol 40 mg 2 times daily. Monitor blood pressure and heart rate at home. Bisacodyl suppository added today to help with passing stools. Low salt diet and plenty of vegetables. No changes in rest.  Please be sure medication list is accurate. If a new problem present, please set up appointment sooner than planned today.

## 2021-12-28 NOTE — Telephone Encounter (Signed)
6 days supply of Lyrica sent to her local pharmacy. Thanks, BJ

## 2021-12-28 NOTE — Assessment & Plan Note (Addendum)
Problem is stable. Lyrica is still helping, continue 300 mg bid. We discussed some side effects. Low impact exercise and good sleep hygiene.

## 2021-12-28 NOTE — Assessment & Plan Note (Signed)
Continue following with psychiatrist and psychotherapist.

## 2021-12-29 DIAGNOSIS — F332 Major depressive disorder, recurrent severe without psychotic features: Secondary | ICD-10-CM | POA: Diagnosis not present

## 2021-12-29 DIAGNOSIS — F4312 Post-traumatic stress disorder, chronic: Secondary | ICD-10-CM | POA: Diagnosis not present

## 2021-12-29 NOTE — Telephone Encounter (Signed)
Noted  

## 2022-01-13 ENCOUNTER — Ambulatory Visit
Admission: RE | Admit: 2022-01-13 | Discharge: 2022-01-13 | Disposition: A | Discharge status: Home / Self Care | Payer: Medicare HMO | Source: Ambulatory Visit | Attending: Family Medicine | Admitting: Family Medicine

## 2022-01-13 ENCOUNTER — Other Ambulatory Visit: Payer: Self-pay | Admitting: Family Medicine

## 2022-01-13 DIAGNOSIS — N6001 Solitary cyst of right breast: Secondary | ICD-10-CM

## 2022-01-13 DIAGNOSIS — N644 Mastodynia: Secondary | ICD-10-CM | POA: Diagnosis not present

## 2022-01-13 DIAGNOSIS — N6452 Nipple discharge: Secondary | ICD-10-CM | POA: Diagnosis not present

## 2022-01-13 DIAGNOSIS — N6320 Unspecified lump in the left breast, unspecified quadrant: Secondary | ICD-10-CM

## 2022-01-13 DIAGNOSIS — R922 Inconclusive mammogram: Secondary | ICD-10-CM | POA: Diagnosis not present

## 2022-01-24 ENCOUNTER — Telehealth: Payer: Self-pay | Admitting: Family Medicine

## 2022-01-24 DIAGNOSIS — N6452 Nipple discharge: Secondary | ICD-10-CM

## 2022-01-24 NOTE — Telephone Encounter (Signed)
Olin Hauser with [facility] is calling in stating that the patient is constantly calling about a MRI Dr.Jordan was supposed to be put int but they didn't receive it. Olin Hauser stated that she do see where it was placed in the notes but don't see where it was ordered. ? ?Olin Hauser could be contacted at [phone]. ? ?Please advise. ?

## 2022-01-24 NOTE — Telephone Encounter (Signed)
MRI ordered per ultrasound notes. I left Olin Hauser a voicemail letting her know.  ?

## 2022-02-03 ENCOUNTER — Other Ambulatory Visit: Payer: Self-pay | Admitting: Family Medicine

## 2022-02-03 ENCOUNTER — Ambulatory Visit
Admission: RE | Admit: 2022-02-03 | Discharge: 2022-02-03 | Disposition: A | Discharge status: Home / Self Care | Payer: Medicare HMO | Source: Ambulatory Visit | Attending: Family Medicine | Admitting: Family Medicine

## 2022-02-03 ENCOUNTER — Other Ambulatory Visit: Payer: Self-pay

## 2022-02-03 DIAGNOSIS — N6311 Unspecified lump in the right breast, upper outer quadrant: Secondary | ICD-10-CM | POA: Diagnosis not present

## 2022-02-03 DIAGNOSIS — R9389 Abnormal findings on diagnostic imaging of other specified body structures: Secondary | ICD-10-CM

## 2022-02-03 DIAGNOSIS — N6321 Unspecified lump in the left breast, upper outer quadrant: Secondary | ICD-10-CM | POA: Diagnosis not present

## 2022-02-03 DIAGNOSIS — Z803 Family history of malignant neoplasm of breast: Secondary | ICD-10-CM | POA: Diagnosis not present

## 2022-02-03 DIAGNOSIS — N6452 Nipple discharge: Secondary | ICD-10-CM

## 2022-02-03 MED ORDER — GADOBUTROL 1 MMOL/ML IV SOLN
8.0000 mL | Freq: Once | INTRAVENOUS | Status: AC | PRN
  Administered 2022-02-03: 8 mL via INTRAVENOUS

## 2022-02-08 DIAGNOSIS — F603 Borderline personality disorder: Secondary | ICD-10-CM | POA: Diagnosis not present

## 2022-02-08 DIAGNOSIS — F315 Bipolar disorder, current episode depressed, severe, with psychotic features: Secondary | ICD-10-CM | POA: Diagnosis not present

## 2022-02-14 ENCOUNTER — Ambulatory Visit
Admission: RE | Admit: 2022-02-14 | Discharge: 2022-02-14 | Disposition: A | Discharge status: Home / Self Care | Payer: Medicare HMO | Source: Ambulatory Visit | Attending: Family Medicine | Admitting: Family Medicine

## 2022-02-14 DIAGNOSIS — N6320 Unspecified lump in the left breast, unspecified quadrant: Secondary | ICD-10-CM

## 2022-02-14 DIAGNOSIS — N6321 Unspecified lump in the left breast, upper outer quadrant: Secondary | ICD-10-CM | POA: Diagnosis not present

## 2022-02-14 DIAGNOSIS — D242 Benign neoplasm of left breast: Secondary | ICD-10-CM | POA: Diagnosis not present

## 2022-02-14 HISTORY — PX: BREAST BIOPSY: SHX20

## 2022-02-15 DIAGNOSIS — F315 Bipolar disorder, current episode depressed, severe, with psychotic features: Secondary | ICD-10-CM | POA: Diagnosis not present

## 2022-02-15 DIAGNOSIS — F603 Borderline personality disorder: Secondary | ICD-10-CM | POA: Diagnosis not present

## 2022-02-17 ENCOUNTER — Other Ambulatory Visit: Payer: Medicare HMO

## 2022-02-17 ENCOUNTER — Inpatient Hospital Stay: Admission: RE | Admit: 2022-02-17 | Payer: Medicare HMO | Source: Ambulatory Visit

## 2022-02-20 ENCOUNTER — Other Ambulatory Visit: Payer: Self-pay | Admitting: Family Medicine

## 2022-02-20 DIAGNOSIS — F603 Borderline personality disorder: Secondary | ICD-10-CM

## 2022-03-06 ENCOUNTER — Other Ambulatory Visit: Payer: Medicare HMO

## 2022-03-06 ENCOUNTER — Inpatient Hospital Stay: Admission: RE | Admit: 2022-03-06 | Payer: Medicare HMO | Source: Ambulatory Visit

## 2022-03-09 DIAGNOSIS — F603 Borderline personality disorder: Secondary | ICD-10-CM | POA: Diagnosis not present

## 2022-03-24 ENCOUNTER — Ambulatory Visit
Admission: RE | Admit: 2022-03-24 | Discharge: 2022-03-24 | Disposition: A | Discharge status: Home / Self Care | Payer: Medicare HMO | Source: Ambulatory Visit | Attending: Family Medicine | Admitting: Family Medicine

## 2022-03-24 DIAGNOSIS — R9389 Abnormal findings on diagnostic imaging of other specified body structures: Secondary | ICD-10-CM

## 2022-03-24 DIAGNOSIS — R928 Other abnormal and inconclusive findings on diagnostic imaging of breast: Secondary | ICD-10-CM | POA: Diagnosis not present

## 2022-03-24 DIAGNOSIS — N6011 Diffuse cystic mastopathy of right breast: Secondary | ICD-10-CM | POA: Diagnosis not present

## 2022-03-24 HISTORY — PX: BREAST BIOPSY: SHX20

## 2022-03-24 MED ORDER — GADOBUTROL 1 MMOL/ML IV SOLN
8.0000 mL | Freq: Once | INTRAVENOUS | Status: AC | PRN
  Administered 2022-03-24: 8 mL via INTRAVENOUS

## 2022-03-27 DIAGNOSIS — F4312 Post-traumatic stress disorder, chronic: Secondary | ICD-10-CM | POA: Diagnosis not present

## 2022-03-27 DIAGNOSIS — F332 Major depressive disorder, recurrent severe without psychotic features: Secondary | ICD-10-CM | POA: Diagnosis not present

## 2022-03-29 ENCOUNTER — Other Ambulatory Visit: Payer: Self-pay | Admitting: Family Medicine

## 2022-03-29 DIAGNOSIS — F603 Borderline personality disorder: Secondary | ICD-10-CM

## 2022-04-06 DIAGNOSIS — F603 Borderline personality disorder: Secondary | ICD-10-CM | POA: Diagnosis not present

## 2022-04-11 ENCOUNTER — Ambulatory Visit: Payer: Self-pay | Admitting: Surgery

## 2022-04-11 DIAGNOSIS — N6099 Unspecified benign mammary dysplasia of unspecified breast: Secondary | ICD-10-CM | POA: Diagnosis not present

## 2022-04-11 DIAGNOSIS — D242 Benign neoplasm of left breast: Secondary | ICD-10-CM | POA: Diagnosis not present

## 2022-04-18 ENCOUNTER — Telehealth: Payer: Self-pay

## 2022-04-18 NOTE — Telephone Encounter (Signed)
---  Caller states she has a bladder infection. States she is having urination pain, smell, and pain. Bottom on stomach aches, reports hx of bladder infection, has been taking AZO. Symptoms began Thursday suddenly.  04/15/2022 9:04:38 AM See HCP within 4 Hours (or PCP triage) Mariea Clonts, RN, [place]   Comments User: Luis Abed, RN Date/Time (Eastern Time): 04/15/2022 9:02:14 AM temp 98.5 temporal.  Referrals [facility] [place]  04/18/22 1421 - Lower back pain, left flank pain. Urinary pain & urgency with foul smelling. Does not feel like she's emptying her bladder. Symptoms started x 5 days ago. Nausea started this weekend & is getting worse; no vomiting. Pt requests appt for tomorrow as she has not gone to UC as instructed & is not able to come to office today. Appt scheduled with Dr Sarajane Jews.

## 2022-04-19 ENCOUNTER — Other Ambulatory Visit: Payer: Self-pay | Admitting: Surgery

## 2022-04-19 ENCOUNTER — Ambulatory Visit: Payer: Medicare HMO | Admitting: Family Medicine

## 2022-04-19 DIAGNOSIS — D242 Benign neoplasm of left breast: Secondary | ICD-10-CM

## 2022-04-25 ENCOUNTER — Other Ambulatory Visit: Payer: Self-pay | Admitting: Family Medicine

## 2022-04-25 DIAGNOSIS — F315 Bipolar disorder, current episode depressed, severe, with psychotic features: Secondary | ICD-10-CM | POA: Diagnosis not present

## 2022-04-25 DIAGNOSIS — F603 Borderline personality disorder: Secondary | ICD-10-CM

## 2022-04-25 DIAGNOSIS — M797 Fibromyalgia: Secondary | ICD-10-CM

## 2022-04-28 ENCOUNTER — Other Ambulatory Visit: Payer: Self-pay | Admitting: Family Medicine

## 2022-04-28 DIAGNOSIS — M797 Fibromyalgia: Secondary | ICD-10-CM

## 2022-04-28 MED ORDER — PREGABALIN 300 MG PO CAPS: 300.0000 mg | ORAL_CAPSULE | Freq: Two times a day (BID) | ORAL | 0 refills | Status: DC

## 2022-04-28 NOTE — Addendum Note (Signed)
Addended by: Nathanial Millman E on: 04/28/2022 01:51 PM   Modules accepted: Orders

## 2022-04-28 NOTE — Telephone Encounter (Signed)
Pt is calling and needs a short supply 8 pillspregabalin (LYRICA) 300 MG capsule  send to  [place], [address] Phone:  [phone]  Fax:  [phone]    Until mail order arrives

## 2022-04-28 NOTE — Telephone Encounter (Signed)
Pt is calling back about her refill and stated she out.

## 2022-04-28 NOTE — Telephone Encounter (Signed)
Rx refilled for Lyrica 6/7. Patient receives other medications from her psychiatrist.

## 2022-05-10 DIAGNOSIS — F411 Generalized anxiety disorder: Secondary | ICD-10-CM | POA: Diagnosis not present

## 2022-05-16 ENCOUNTER — Inpatient Hospital Stay (HOSPITAL_COMMUNITY): Admission: RE | Admit: 2022-05-16 | Payer: Medicare HMO | Source: Ambulatory Visit

## 2022-05-17 NOTE — Progress Notes (Signed)
Surgical Instructions    Your procedure is scheduled on Wednesday July 5.  Report to [facility] Main Entrance "A" at 6:30 A.M., then check in with the Admitting office.  Call this number if you have problems the morning of surgery:  [phone]   If you have any questions prior to your surgery date call [phone]: Open Monday-Friday 8am-4pm    Remember:  Do not eat after midnight the night before your surgery  You may drink clear liquids until 5:30am the morning of your surgery.   Clear liquids allowed are: Water, Non-Citrus Juices (without pulp), Carbonated Beverages, Clear Tea, Black Coffee ONLY (NO MILK, CREAM OR POWDERED CREAMER of any kind), and Gatorade    Take these medicines the morning of surgery with A SIP OF WATER:  acetaminophen (TYLENOL) if needed omeprazole (PRILOSEC) pregabalin (LYRICA) propranolol (INDERAL)  As of today, STOP taking any Aspirin (unless otherwise instructed by your surgeon) Aleve, Naproxen, Ibuprofen, Motrin, Advil, Goody's, BC's, all herbal medications, fish oil, and all vitamins.           Do not wear jewelry or makeup Do not wear lotions, powders, perfumes/colognes, or deodorant. Do not shave 48 hours prior to surgery.  Men may shave face and neck. Do not bring valuables to the hospital. Do not wear nail polish, gel polish, artificial nails, or any other type of covering on natural nails (fingers and toes) If you have artificial nails or gel coating that need to be removed by a nail salon, please have this removed prior to surgery. Artificial nails or gel coating may interfere with anesthesia's ability to adequately monitor your vital signs.  [facility] is not responsible for any belongings or valuables. .   Do NOT Smoke (Tobacco/Vaping)  24 hours prior to your procedure  If you use a CPAP at night, you may bring your mask for your overnight stay.   Contacts, glasses, hearing aids, dentures or partials may not be worn into surgery, please  bring cases for these belongings   For patients admitted to the hospital, discharge time will be determined by your treatment team.   Patients discharged the day of surgery will not be allowed to drive home, and someone needs to stay with them for 24 hours.   SURGICAL WAITING ROOM VISITATION Patients having surgery or a procedure in a hospital may have two support people. Children under the age of 28 must have an adult with them who is not the patient. They may stay in the waiting area during the procedure and may switch out with other visitors. If the patient needs to stay at the hospital during part of their recovery, the visitor guidelines for inpatient rooms apply.  Please refer to the [facility] website for the visitor guidelines for Inpatients (after your surgery is over and you are in a regular room).       Special instructions:    Oral Hygiene is also important to reduce your risk of infection.  Remember - BRUSH YOUR TEETH THE MORNING OF SURGERY WITH YOUR REGULAR TOOTHPASTE   [facility]- Preparing For Surgery  Before surgery, you can play an important role. Because skin is not sterile, your skin needs to be as free of germs as possible. You can reduce the number of germs on your skin by washing with CHG (chlorahexidine gluconate) Soap before surgery.  CHG is an antiseptic cleaner which kills germs and bonds with the skin to continue killing germs even after washing.     Please  do not use if you have an allergy to CHG or antibacterial soaps. If your skin becomes reddened/irritated stop using the CHG.  Do not shave (including legs and underarms) for at least 48 hours prior to first CHG shower. It is OK to shave your face.  Please follow these instructions carefully.     Shower the NIGHT BEFORE SURGERY and the MORNING OF SURGERY with CHG Soap.   If you chose to wash your hair, wash your hair first as usual with your normal shampoo. After you shampoo, rinse your hair and body  thoroughly to remove the shampoo.  Then [employer] and genitals (private parts) with your normal soap and rinse thoroughly to remove soap.  After that Use CHG Soap as you would any other liquid soap. You can apply CHG directly to the skin and wash gently with a scrungie or a clean washcloth.   Apply the CHG Soap to your body ONLY FROM THE NECK DOWN.  Do not use on open wounds or open sores. Avoid contact with your eyes, ears, mouth and genitals (private parts). Wash Face and genitals (private parts)  with your normal soap.   Wash thoroughly, paying special attention to the area where your surgery will be performed.  Thoroughly rinse your body with warm water from the neck down.  DO NOT shower/wash with your normal soap after using and rinsing off the CHG Soap.  Pat yourself dry with a CLEAN TOWEL.  Wear CLEAN PAJAMAS to bed the night before surgery  Place CLEAN SHEETS on your bed the night before your surgery  DO NOT SLEEP WITH PETS.   Day of Surgery:  Take a shower with CHG soap. Wear Clean/Comfortable clothing the morning of surgery Do not apply any deodorants/lotions.   Remember to brush your teeth WITH YOUR REGULAR TOOTHPASTE.    If you received a COVID test during your pre-op visit, it is requested that you wear a mask when out in public, stay away from anyone that may not be feeling well, and notify your surgeon if you develop symptoms. If you have been in contact with anyone that has tested positive in the last 10 days, please notify your surgeon.    Please read over the following fact sheets that you were given.

## 2022-05-18 ENCOUNTER — Inpatient Hospital Stay (HOSPITAL_COMMUNITY)
Admission: RE | Admit: 2022-05-18 | Discharge: 2022-05-18 | Disposition: A | Discharge status: Home / Self Care | Payer: Medicare HMO | Source: Ambulatory Visit

## 2022-05-26 ENCOUNTER — Telehealth: Payer: Self-pay | Admitting: Family Medicine

## 2022-05-26 ENCOUNTER — Other Ambulatory Visit: Payer: Self-pay | Admitting: Family Medicine

## 2022-05-26 DIAGNOSIS — M797 Fibromyalgia: Secondary | ICD-10-CM

## 2022-05-26 DIAGNOSIS — F603 Borderline personality disorder: Secondary | ICD-10-CM

## 2022-05-26 DIAGNOSIS — K5900 Constipation, unspecified: Secondary | ICD-10-CM

## 2022-05-26 NOTE — Telephone Encounter (Signed)
Patient called to schedule CPE and ask for refill on pregabalin (LYRICA) 300 MG capsule.   Appointment scheduled for Wednesday July 12th at 3:30pm. Would like a refill before appointment because she is in pain.   Please send to [place], [place] Phone:  [phone]  Fax:  [phone]        Please advise

## 2022-05-26 NOTE — Telephone Encounter (Signed)
Last Rx for Lyrica refills on 05/02/22 # 60; so I think she should be fine until her visit on 05/31/22. Thanks, BJ

## 2022-05-30 DIAGNOSIS — F603 Borderline personality disorder: Secondary | ICD-10-CM | POA: Diagnosis not present

## 2022-05-30 NOTE — Progress Notes (Deleted)
HPI: [name] [name] is a 51 y.o. female, who is here today for her routine physical.  Last CPE: AWV 06/01/21  Regular exercise 3 or more time per week: *** Following a healthy diet: ***  Chronic medical problems: ***  Immunization History  Administered Date(s) Administered   Influenza Whole 09/01/2009, 08/07/2012   Influenza,inj,Quad PF,6+ Mos 09/13/2018   Tdap 11/07/2012   Health Maintenance  Topic Date Due   COVID-19 Vaccine (1) Never done   Hepatitis C Screening  Never done   INFLUENZA VACCINE  06/20/2022   TETANUS/TDAP  11/07/2022   COLONOSCOPY (Pts 45-9yr Insurance coverage will need to be confirmed)  05/19/2029   HIV Screening  Completed   HPV VACCINES  Aged Out   PAP SMEAR-Modifier  Discontinued    She has *** concerns today.  Review of Systems  Current Outpatient Medications on File Prior to Visit  Medication Sig Dispense Refill   acetaminophen (TYLENOL) 500 MG tablet Take 1,000 mg by mouth every 6 (six) hours as needed for moderate pain.     bisacodyl (DULCOLAX) 10 MG suppository Place 1 suppository (10 mg total) rectally daily as needed for moderate constipation. (Patient not taking: Reported on 05/11/2022) 30 suppository 1   EPINEPHrine 0.3 mg/0.3 mL IJ SOAJ injection Inject 0.3 mg into the muscle as needed for anaphylaxis. 2 each 0   escitalopram (LEXAPRO) 20 MG tablet Take 20 mg by mouth at bedtime.     ferrous sulfate 325 (65 FE) MG tablet Take 325 mg by mouth at bedtime.     lamoTRIgine (LAMICTAL) 100 MG tablet TAKE 1 TABLET TWICE DAILY (Patient taking differently: Take 100 mg by mouth at bedtime.) 20 tablet 0   LINZESS 145 MCG CAPS capsule TAKE 1 CAPSULE EVERY DAY AS NEEDED 90 capsule 0   lurasidone (LATUDA) 20 MG TABS tablet TAKE 1 TABLET AT BEDTIME 10 tablet 0   Multiple Vitamins-Minerals (MULTIVITAMIN WITH MINERALS) tablet Take 1 tablet by mouth at bedtime.     omeprazole (PRILOSEC) 40 MG capsule TAKE 1 CAPSULE EVERY DAY 90 capsule 1    pregabalin (LYRICA) 300 MG capsule Take 1 capsule (300 mg total) by mouth 2 (two) times daily. 8 capsule 0   propranolol (INDERAL) 40 MG tablet Take 1 tablet (40 mg total) by mouth 2 (two) times daily. (Patient taking differently: Take 40 mg by mouth daily.) 180 tablet 2   No current facility-administered medications on file prior to visit.     Past Medical History:  Diagnosis Date   Anxiety    B[place] disorder [place]    Chronic kidney disease    kidney infections   Depression    Fibromyalgia    GERD (gastroesophageal reflux disease)    History of cervical dysplasia    History of exercise intolerance    normal ETT 10-24-2016   History of kidney stones    Hyperlipidemia    Hypertension    Migraines    Osteoarthritis    PCOS (polycystic ovarian syndrome)    Pneumonia 2018   Rash    in area of eswl 04-23-2017   Right ureteral calculus    Sleep apnea    Tachycardia cardiologist-  dr harding   controlled w/ metoprolol    Past Surgical History:  Procedure Laterality Date   ANTERIOR CERVICAL DECOMP/DISCECTOMY FUSION N/A 09/01/2015   Procedure: ANTERIOR CERVICAL DECOMPRESSION/DISCECTOMY FUSION PLATING BONEGRAFT CERVICAL FIVE-SIX;  Surgeon: BKevan NyDitty, MD;  Location: [facility];  Service: Neurosurgery;  Laterality:  N/A;   BIOPSY  05/20/2019   Procedure: BIOPSY;  Surgeon: Juanita Craver, MD;  Location: [facility];  Service: Endoscopy;;   BREAST BIOPSY Left 02/14/2022   U/S   BREAST BIOPSY Right 03/24/2022   MRI   BREAST EXCISIONAL BIOPSY Bilateral left 10/15/2002;   right 1997   left ductal system excision (papilloma w/ florid epithelial hyperplasia)/  right benign lumpectomy   CARDIOVASCULAR STRESS TEST  04/27/2009   normal nuclear study w/ no ischemia/  normal LV function and wall motion , ef 66%   CHOLECYSTECTOMY N/A 03/13/2013   Procedure: LAPAROSCOPIC CHOLECYSTECTOMY;  Surgeon: Harl Bowie, MD;  Location: [facility];  Service: General;   Laterality: N/A;   COLONOSCOPY  last one 11-08-2006   COLONOSCOPY N/A 05/20/2019   Procedure: COLONOSCOPY;  Surgeon: Juanita Craver, MD;  Location: [facility];  Service: Endoscopy;  Laterality: N/A;   CYSTOSCOPY N/A 05/08/2018   Procedure: CYSTOSCOPY;  Surgeon: Paula Compton, MD;  Location: [facility];  Service: Gynecology;  Laterality: N/A;   CYSTOSCOPY WITH RETROGRADE PYELOGRAM, URETEROSCOPY AND STENT PLACEMENT Right 05/18/2017   Procedure: CYSTOSCOPY WITH RETROGRADE PYELOGRAM, URETEROSCOPY AND STENT PLACEMENT;  Surgeon: Alexis Frock, MD;  Location: [facility];  Service: Urology;  Laterality: Right;   DX LAPAROSCOPY W/ LYSIS ADHESIONS/  LASER VAPORIZATION OF CERVIX  06/25/2002   ESOPHAGOGASTRODUODENOSCOPY  04/25/2005   ESOPHAGOGASTRODUODENOSCOPY (EGD) WITH PROPOFOL N/A 05/20/2019   Procedure: ESOPHAGOGASTRODUODENOSCOPY (EGD) WITH PROPOFOL;  Surgeon: Juanita Craver, MD;  [facility];  Service: Endoscopy;  Laterality: N/A;   EXPLORATORY LEFT THUMB REPAIR TENDON/ LACERATION  09/27/1999   EXTRACORPOREAL SHOCK WAVE LITHOTRIPSY Right 04/23/2017   Procedure: RIGHT EXTRACORPOREAL SHOCK WAVE LITHOTRIPSY (ESWL);  Surgeon: Alexis Frock, MD;  Location: [facility];  Service: Urology;  Laterality: Right;   EXTRACORPOREAL SHOCK WAVE LITHOTRIPSY Right 04/23/2017   HOLMIUM LASER APPLICATION Right 54/62/7035   Procedure: HOLMIUM LASER APPLICATION;  Surgeon: Alexis Frock, MD;  Location: [facility];  Service: Urology;  Laterality: Right;   LAPAROSCOPIC VAGINAL HYSTERECTOMY WITH SALPINGECTOMY Bilateral 05/08/2018   Procedure: LAPAROSCOPIC ASSISTED VAGINAL HYSTERECTOMY WITH SALPINGECTOMY,  I & D Sebaceous Left Labia;  Surgeon: Paula Compton, MD;  Location: [facility];  Service: Gynecology;  Laterality: Bilateral;   LAPAROSCOPY  07/30/2012   Procedure: LAPAROSCOPY DIAGNOSTIC;  Surgeon: Logan Bores, MD;  Location: [facility];  Service: Gynecology;  Laterality: N/A;    POLYPECTOMY  05/20/2019   Procedure: POLYPECTOMY;  Surgeon: Juanita Craver, MD;  [facility];  Service: Endoscopy;;   WISDOM TOOTH EXTRACTION      Allergies  Allergen Reactions   Aspirin Shortness Of Breath and Other (See Comments)    Angiodema   Bee Venom Hives    Face swelling and chest pain   Diclofenac Anaphylaxis   Naproxen Swelling    Facial swelling   Nsaids Anaphylaxis    Swelling of eyes mouth and throat difficulty breathing   Prednisone     Worsened depression, suicidal ideation    Terbinafine And Related     Worsened depression, suicidal ideation    Family History  Problem Relation Age of Onset   Diabetes Father    Hypertension Father    Arthritis Father    Depression Father    Hearing loss Father    Diabetes Mother    Hypertension Mother    Arthritis Mother    Depression Mother    Asthma Paternal Grandmother    Kidney disease Paternal Grandmother    Arthritis  Paternal Grandmother    Cancer Paternal Grandmother    COPD Paternal Grandmother    Hearing loss Paternal Grandmother    Heart disease Paternal Grandmother    Hypertension Paternal Grandmother    Hyperlipidemia Paternal Grandmother    Stroke Paternal Grandmother    Heart attack Paternal Grandmother    Depression Sister    Emphysema Other    Tongue cancer Other    Coronary artery disease Other    COPD Son    Drug abuse Son    Arthritis Maternal Grandmother    COPD Maternal Grandmother    Depression Maternal Grandmother    Heart disease Maternal Grandmother    Early death Maternal Grandfather    Heart attack Maternal Grandfather    COPD Paternal Grandfather    Heart attack Paternal Grandfather    Breast cancer Neg Hx     Social History   Socioeconomic History   Marital status: Legally Separated    Spouse name: Not on file   Number of children: 2   Years of education: Not on file   Highest education level: Not on file  Occupational History   Occupation: Unemployed  Tobacco  Use   Smoking status: Some Days    Packs/day: 0.50    Years: 25.00    Total pack years: 12.50    Types: Cigarettes   Smokeless tobacco: Never   Tobacco comments:    6-7 cig. daily  Vaping Use   Vaping Use: Never used  Substance and Sexual Activity   Alcohol use: No   Drug use: No    Types: Cocaine    Comment: last cocaine use 2014   Sexual activity: Not Currently    Partners: Male    Birth control/protection: None  Other Topics Concern   Not on file  Social History Narrative   She lives at home with her spouse. She does not exercise.   She currently denies recent recreational drug use.   Social Determinants of Health   Financial Resource Strain: Low Risk  (06/01/2021)   Overall Financial Resource Strain (CARDIA)    Difficulty of Paying Living Expenses: Not hard at all  Food Insecurity: No Food Insecurity (06/01/2021)   Hunger Vital Sign    Worried About Running Out of Food in the Last Year: Never true    Ran Out of Food in the Last Year: Never true  Transportation Needs: No Transportation Needs (06/01/2021)   PRAPARE - Hydrologist (Medical): No    Lack of Transportation (Non-Medical): No  Physical Activity: Inactive (06/01/2021)   Exercise Vital Sign    Days of Exercise per Week: 0 days    Minutes of Exercise per Session: 0 min  Stress: Stress Concern Present (06/01/2021)   [place]    Feeling of Stress : Rather much  Social Connections: Socially Isolated (06/01/2021)   Social Connection and Isolation Panel [NHANES]    Frequency of Communication with Friends and Family: Once a week    Frequency of Social Gatherings with Friends and Family: Never    Attends Religious Services: Never    Marine scientist or Organizations: No    Attends Archivist Meetings: Never    Marital Status: Separated    There were no vitals filed for this visit. There is no height or  weight on file to calculate BMI.  Wt Readings from Last 3 Encounters:  12/28/21 178 lb (80.7 kg)  06/13/21 174 lb 2 oz (79 kg)  10/26/20 159 lb 12.8 oz (72.5 kg)     Physical Exam  ASSESSMENT AND PLAN:  [name] [name] was here today annual physical examination.  No orders of the defined types were placed in this encounter.   There are no diagnoses linked to this encounter.  No problem-specific Assessment & Plan notes found for this encounter.   No follow-ups on file.  [name]. Martinique, MD  [facility]. [place] office.

## 2022-05-31 ENCOUNTER — Encounter: Payer: Medicare HMO | Admitting: Family Medicine

## 2022-05-31 DIAGNOSIS — I10 Essential (primary) hypertension: Secondary | ICD-10-CM

## 2022-05-31 DIAGNOSIS — Z0000 Encounter for general adult medical examination without abnormal findings: Secondary | ICD-10-CM

## 2022-06-07 NOTE — Telephone Encounter (Signed)
Appt needs to be with pcp.

## 2022-06-07 NOTE — Telephone Encounter (Signed)
Let's make her an appointment with Dr. Martinique after she sees [place].

## 2022-06-07 NOTE — Telephone Encounter (Signed)
Pt wants a call back to explain why she cannot have her refill. She says she is out of her mind in pain and needs this medication badly.  Please call [phone]

## 2022-06-07 NOTE — Telephone Encounter (Signed)
Called [name] [name] to explain that MD wants her to come in for an appt.   [name] [name] said she can come in tomorrow, first thing.  [name] [name] was told MD will not be here tomorrow.  [name] [name] asked if she could see anyone else. [name] [name] was booked to see NP Dorothyann Peng, at 11:30 am on Thursday 06/08/22.

## 2022-06-08 ENCOUNTER — Encounter: Payer: Self-pay | Admitting: Adult Health

## 2022-06-08 ENCOUNTER — Ambulatory Visit (INDEPENDENT_AMBULATORY_CARE_PROVIDER_SITE_OTHER): Payer: Medicare HMO | Admitting: Adult Health

## 2022-06-08 VITALS — BP 130/86 | HR 65 | Temp 97.7°F | Ht 67.0 in | Wt 168.6 lb

## 2022-06-08 DIAGNOSIS — M797 Fibromyalgia: Secondary | ICD-10-CM | POA: Diagnosis not present

## 2022-06-08 DIAGNOSIS — R300 Dysuria: Secondary | ICD-10-CM

## 2022-06-08 LAB — POC URINALSYSI DIPSTICK (AUTOMATED)
Bilirubin, UA: NEGATIVE
Blood, UA: NEGATIVE
Glucose, UA: NEGATIVE
Ketones, UA: NEGATIVE
Leukocytes, UA: NEGATIVE
Nitrite, UA: NEGATIVE
Protein, UA: NEGATIVE
Spec Grav, UA: 1.02 (ref 1.010–1.025)
Urobilinogen, UA: 0.2 E.U./dL
pH, UA: 6.5 (ref 5.0–8.0)

## 2022-06-08 MED ORDER — PREGABALIN 300 MG PO CAPS: 300.0000 mg | ORAL_CAPSULE | Freq: Two times a day (BID) | ORAL | 0 refills | Status: DC

## 2022-06-08 NOTE — Progress Notes (Signed)
Subjective:    Patient ID: [name] [name], female    DOB: [birth year], 51 y.o.   MRN: [id number]  HPI 51 year old female who  has a past medical history of Anxiety, Bipolar disorder ([place]), Chronic kidney disease, Depression, Fibromyalgia, GERD (gastroesophageal reflux disease), History of cervical dysplasia, History of exercise intolerance, History of kidney stones, Hyperlipidemia, Hypertension, Migraines, Osteoarthritis, PCOS (polycystic ovarian syndrome), Pneumonia (2018), Rash, Right ureteral calculus, Sleep apnea, and Tachycardia (cardiologist-  dr Ellyn Hack).  She is a patient of Dr. Martinique who comes into the office today for multiple complaints.   Medication refill- she is out of her lyrica. It looks like she was supposed to have a CPE with her PCP on 7/12 but she had to cancel it. She reports that she is in severe pain and needs a refill of her medication.  She has been out of her medication for roughly 8 days   2. Concern for UTI - for the last few months she has had dysuria, frequency, urgency, lower pelvic pressure, and low back pain.  He does report a history of UTIs in the seen at [facility] urology.   Review of Systems See HPI   Past Medical History:  Diagnosis Date   Anxiety    Bipolar disorder ([place])    Chronic kidney disease    kidney infections   Depression    Fibromyalgia    GERD (gastroesophageal reflux disease)    History of cervical dysplasia    History of exercise intolerance    normal ETT 10-24-2016   History of kidney stones    Hyperlipidemia    Hypertension    Migraines    Osteoarthritis    PCOS (polycystic ovarian syndrome)    Pneumonia 2018   Rash    in area of eswl 04-23-2017   Right ureteral calculus    Sleep apnea    Tachycardia cardiologist-  dr harding   controlled w/ metoprolol    Social History   Socioeconomic History   Marital status: Legally Separated    Spouse name: Not on file   Number of children: 2   Years of education: Not on  file   Highest education level: Not on file  Occupational History   Occupation: Unemployed  Tobacco Use   Smoking status: Some Days    Packs/day: 0.50    Years: 25.00    Total pack years: 12.50    Types: Cigarettes   Smokeless tobacco: Never   Tobacco comments:    6-7 cig. daily  Vaping Use   Vaping Use: Never used  Substance and Sexual Activity   Alcohol use: No   Drug use: No    Types: Cocaine    Comment: last cocaine use 2014   Sexual activity: Not Currently    Partners: Male    Birth control/protection: None  Other Topics Concern   Not on file  Social History Narrative   She lives at home with her spouse. She does not exercise.   She currently denies recent recreational drug use.   Social Determinants of Health   Financial Resource Strain: Low Risk  (06/01/2021)   Overall Financial Resource Strain (CARDIA)    Difficulty of Paying Living Expenses: Not hard at all  Food Insecurity: No Food Insecurity (06/01/2021)   Hunger Vital Sign    Worried About Running Out of Food in the Last Year: Never true    Ran Out of Food in the Last Year: Never true  Transportation Needs: No  Transportation Needs (06/01/2021)   PRAPARE - Hydrologist (Medical): No    Lack of Transportation (Non-Medical): No  Physical Activity: Inactive (06/01/2021)   Exercise Vital Sign    Days of Exercise per Week: 0 days    Minutes of Exercise per Session: 0 min  Stress: Stress Concern Present (06/01/2021)   [place]    Feeling of Stress : Rather much  Social Connections: Socially Isolated (06/01/2021)   Social Connection and Isolation Panel [NHANES]    Frequency of Communication with Friends and Family: Once a week    Frequency of Social Gatherings with Friends and Family: Never    Attends Religious Services: Never    Marine scientist or Organizations: No    Attends Archivist Meetings: Never     Marital Status: Separated  Intimate Partner Violence: Not At Risk (06/01/2021)   Humiliation, Afraid, Rape, and Kick questionnaire    Fear of Current or Ex-Partner: No    Emotionally Abused: No    Physically Abused: No    Sexually Abused: No    Past Surgical History:  Procedure Laterality Date   ANTERIOR CERVICAL DECOMP/DISCECTOMY FUSION N/A 09/01/2015   Procedure: ANTERIOR CERVICAL DECOMPRESSION/DISCECTOMY FUSION PLATING BONEGRAFT CERVICAL FIVE-SIX;  Surgeon: Kevan Ny Ditty, MD;  Location: [facility];  Service: Neurosurgery;  Laterality: N/A;   BIOPSY  05/20/2019   Procedure: BIOPSY;  Surgeon: Juanita Craver, MD;  Location: [facility];  Service: Endoscopy;;   BREAST BIOPSY Left 02/14/2022   U/S   BREAST BIOPSY Right 03/24/2022   MRI   BREAST EXCISIONAL BIOPSY Bilateral left 10/15/2002;   right 1997   left ductal system excision (papilloma w/ florid epithelial hyperplasia)/  right benign lumpectomy   CARDIOVASCULAR STRESS TEST  04/27/2009   normal nuclear study w/ no ischemia/  normal LV function and wall motion , ef 66%   CHOLECYSTECTOMY N/A 03/13/2013   Procedure: LAPAROSCOPIC CHOLECYSTECTOMY;  Surgeon: Harl Bowie, MD;  Location: [place];  Service: General;  Laterality: N/A;   COLONOSCOPY  last one 11-08-2006   COLONOSCOPY N/A 05/20/2019   Procedure: COLONOSCOPY;  Surgeon: Juanita Craver, MD;  Location: [facility];  Service: Endoscopy;  Laterality: N/A;   CYSTOSCOPY N/A 05/08/2018   Procedure: CYSTOSCOPY;  Surgeon: Paula Compton, MD;  Location: [facility];  Service: Gynecology;  Laterality: N/A;   CYSTOSCOPY WITH RETROGRADE PYELOGRAM, URETEROSCOPY AND STENT PLACEMENT Right 05/18/2017   Procedure: CYSTOSCOPY WITH RETROGRADE PYELOGRAM, URETEROSCOPY AND STENT PLACEMENT;  Surgeon: Alexis Frock, MD;  Location: [facility];  Service: Urology;  Laterality: Right;   DX LAPAROSCOPY W/ LYSIS ADHESIONS/  LASER VAPORIZATION OF CERVIX   06/25/2002   ESOPHAGOGASTRODUODENOSCOPY  04/25/2005   ESOPHAGOGASTRODUODENOSCOPY (EGD) WITH PROPOFOL N/A 05/20/2019   Procedure: ESOPHAGOGASTRODUODENOSCOPY (EGD) WITH PROPOFOL;  Surgeon: Juanita Craver, MD;  [facility];  Service: Endoscopy;  Laterality: N/A;   EXPLORATORY LEFT THUMB REPAIR TENDON/ LACERATION  09/27/1999   EXTRACORPOREAL SHOCK WAVE LITHOTRIPSY Right 04/23/2017   Procedure: RIGHT EXTRACORPOREAL SHOCK WAVE LITHOTRIPSY (ESWL);  Surgeon: Alexis Frock, MD;  Location: [facility];  Service: Urology;  Laterality: Right;   EXTRACORPOREAL SHOCK WAVE LITHOTRIPSY Right 04/23/2017   HOLMIUM LASER APPLICATION Right 70/26/3785   Procedure: HOLMIUM LASER APPLICATION;  Surgeon: Alexis Frock, MD;  Location: [facility];  Service: Urology;  Laterality: Right;   LAPAROSCOPIC VAGINAL HYSTERECTOMY WITH SALPINGECTOMY Bilateral 05/08/2018   Procedure: LAPAROSCOPIC ASSISTED VAGINAL  HYSTERECTOMY WITH SALPINGECTOMY,  I & D Sebaceous Left Labia;  Surgeon: Paula Compton, MD;  Location: [place] [facility];  Service: Gynecology;  Laterality: Bilateral;   LAPAROSCOPY  07/30/2012   Procedure: LAPAROSCOPY DIAGNOSTIC;  Surgeon: Logan Bores, MD;  Location: [facility];  Service: Gynecology;  Laterality: N/A;   POLYPECTOMY  05/20/2019   Procedure: POLYPECTOMY;  Surgeon: Juanita Craver, MD;  [facility];  Service: Endoscopy;;   WISDOM TOOTH EXTRACTION      Family History  Problem Relation Age of Onset   Diabetes Father    Hypertension Father    Arthritis Father    Depression Father    Hearing loss Father    Diabetes Mother    Hypertension Mother    Arthritis Mother    Depression Mother    Asthma Paternal Grandmother    Kidney disease Paternal Grandmother    Arthritis Paternal Grandmother    Cancer Paternal Grandmother    COPD Paternal Grandmother    Hearing loss Paternal Grandmother    Heart disease Paternal Grandmother    Hypertension Paternal Grandmother     Hyperlipidemia Paternal Grandmother    Stroke Paternal Grandmother    Heart attack Paternal Grandmother    Depression Sister    Emphysema Other    Tongue cancer Other    Coronary artery disease Other    COPD Son    Drug abuse Son    Arthritis Maternal Grandmother    COPD Maternal Grandmother    Depression Maternal Grandmother    Heart disease Maternal Grandmother    Early death Maternal Grandfather    Heart attack Maternal Grandfather    COPD Paternal Grandfather    Heart attack Paternal Grandfather    Breast cancer Neg Hx     Allergies  Allergen Reactions   Aspirin Shortness Of Breath and Other (See Comments)    Angiodema   Bee Venom Hives    Face swelling and chest pain   Diclofenac Anaphylaxis   Naproxen Swelling    Facial swelling   Nsaids Anaphylaxis    Swelling of eyes mouth and throat difficulty breathing   Prednisone     Worsened depression, suicidal ideation    Terbinafine And Related     Worsened depression, suicidal ideation    Current Outpatient Medications on File Prior to Visit  Medication Sig Dispense Refill   acetaminophen (TYLENOL) 500 MG tablet Take 1,000 mg by mouth every 6 (six) hours as needed for moderate pain.     EPINEPHrine 0.3 mg/0.3 mL IJ SOAJ injection Inject 0.3 mg into the muscle as needed for anaphylaxis. 2 each 0   escitalopram (LEXAPRO) 20 MG tablet Take 20 mg by mouth at bedtime.     ferrous sulfate 325 (65 FE) MG tablet Take 325 mg by mouth at bedtime.     lamoTRIgine (LAMICTAL) 100 MG tablet TAKE 1 TABLET TWICE DAILY (Patient taking differently: Take 100 mg by mouth at bedtime.) 20 tablet 0   LINZESS 145 MCG CAPS capsule TAKE 1 CAPSULE EVERY DAY AS NEEDED 90 capsule 0   lurasidone (LATUDA) 20 MG TABS tablet TAKE 1 TABLET AT BEDTIME 10 tablet 0   Multiple Vitamins-Minerals (MULTIVITAMIN WITH MINERALS) tablet Take 1 tablet by mouth at bedtime.     omeprazole (PRILOSEC) 40 MG capsule TAKE 1 CAPSULE EVERY DAY 90 capsule 1   pregabalin  (LYRICA) 300 MG capsule Take 1 capsule (300 mg total) by mouth 2 (two) times daily. 8 capsule 0   propranolol (INDERAL) 40  MG tablet Take 1 tablet (40 mg total) by mouth 2 (two) times daily. (Patient taking differently: Take 40 mg by mouth daily.) 180 tablet 2   No current facility-administered medications on file prior to visit.    BP (!) 160/100 (BP Location: Left Arm, Patient Position: Sitting, Cuff Size: Normal)   Pulse 65   Temp 97.7 F (36.5 C) (Oral)   Ht '5\' 7"'$  (1.702 m)   Wt 168 lb 9.6 oz (76.5 kg)   LMP 04/30/2018   SpO2 98%   BMI 26.41 kg/m       Objective:   Physical Exam Vitals and nursing note reviewed.  Constitutional:      Appearance: Normal appearance.  Cardiovascular:     Rate and Rhythm: Normal rate and regular rhythm.     Pulses: Normal pulses.     Heart sounds: Normal heart sounds.  Pulmonary:     Effort: Pulmonary effort is normal.     Breath sounds: Normal breath sounds.  Musculoskeletal:        General: Normal range of motion.  Skin:    General: Skin is warm and dry.  Neurological:     General: No focal deficit present.     Mental Status: She is alert and oriented to person, place, and time.       Assessment & Plan:  1. Fibromyalgia - Will fill prescription for 15 days. This gives her enough time to schedule a CPE with her PCP - pregabalin (LYRICA) 300 MG capsule; Take 1 capsule (300 mg total) by mouth 2 (two) times daily for 15 days.  Dispense: 30 capsule; Refill: 0  2. Dysuria  - POCT Urinalysis Dipstick (Automated)- negative. Will send culture due to symptoms.  - Culture, Urine; Future  Dorothyann Peng, NP

## 2022-06-08 NOTE — Telephone Encounter (Signed)
Called Pt to schedule an OV with Dr. Martinique. No answer. Left voicemail for her to call back and get scheduled.

## 2022-06-09 NOTE — Telephone Encounter (Signed)
Pt has been sch for 06/14/2022

## 2022-06-10 LAB — URINE CULTURE
MICRO NUMBER:: 13673213
SPECIMEN QUALITY:: ADEQUATE

## 2022-06-12 ENCOUNTER — Telehealth: Payer: Self-pay | Admitting: Family Medicine

## 2022-06-12 ENCOUNTER — Other Ambulatory Visit: Payer: Self-pay | Admitting: Family Medicine

## 2022-06-12 DIAGNOSIS — N390 Urinary tract infection, site not specified: Secondary | ICD-10-CM

## 2022-06-12 MED ORDER — NITROFURANTOIN MONOHYD MACRO 100 MG PO CAPS: 100.0000 mg | ORAL_CAPSULE | Freq: Two times a day (BID) | ORAL | 0 refills | Status: AC

## 2022-06-12 NOTE — Telephone Encounter (Signed)
Macrobid Rx sent. BJ

## 2022-06-12 NOTE — Telephone Encounter (Signed)
Pt requests that she be called at the alternate number for any test results, her phone is broken. states there was a urine culture sent off.

## 2022-06-13 ENCOUNTER — Other Ambulatory Visit: Payer: Self-pay

## 2022-06-13 ENCOUNTER — Other Ambulatory Visit: Payer: Self-pay | Admitting: Adult Health

## 2022-06-13 ENCOUNTER — Encounter (HOSPITAL_BASED_OUTPATIENT_CLINIC_OR_DEPARTMENT_OTHER): Payer: Self-pay | Admitting: Surgery

## 2022-06-13 DIAGNOSIS — F603 Borderline personality disorder: Secondary | ICD-10-CM | POA: Diagnosis not present

## 2022-06-13 MED ORDER — AMOXICILLIN-POT CLAVULANATE 875-125 MG PO TABS: 1.0000 | ORAL_TABLET | Freq: Two times a day (BID) | ORAL | 0 refills | Status: DC

## 2022-06-13 NOTE — Progress Notes (Unsigned)
HPI: [name] [name] is a 51 y.o. female, who is here today for chronic disease management.  Last seen on 01/17/22 Since her last visit she has been diagnosed with left breast intraductal papilloma, she is following with general surgeon,Dr Cornett.  She has been scheduled for left breast lumpectomy with radioactive seed localization on 06/20/2022.  Chronic pain: Polyarthralgia and fibromyalgia, she is on Lyrica 300 mg bid. Medication is still helping with pain. Tolerating well. Sleeping 9 hours at least. + Fatigue. She is not exercising regularly. Has lost some wt , did not eat well when she was living with her boyfriend.  Bipolar disorder: She is following with psychiatrist q 6 weeks and psychotherapist q 2 weeks. She was living with her boyfriend but she is back with ehr parents.  Hypertension and sinus tachycardia:  Medications:Propranolol 40 mg, she is not taking medication daily but bid prn for sinus tachycardia. BP readings at home:Not checking at home. BP checked during plasma donation and has been mildly elevated: 140's/90's, HR has been "ok." Side effects:None.  Negative for unusual or severe headache, visual changes, exertional chest pain, dyspnea,  focal weakness, or edema.  Lab Results  Component Value Date   CREATININE 0.69 06/13/2021   BUN 10 06/13/2021   NA 139 06/13/2021   K 4.4 06/13/2021   CL 104 06/13/2021   CO2 27 06/13/2021   Constipation: She is on Linzess 145 mcg daily as needed. Medication helps. She has had some diarrhea caused by stress. GERD: She is taking Omeprazole 40 mg daily prn. Negative for abdominal pain, nausea, vomiting, heartburn, blood in stool or melena.  Review of Systems  Constitutional:  Negative for activity change, appetite change and fever.  HENT:  Negative for mouth sores, nosebleeds and sore throat.   Respiratory:  Negative for cough and wheezing.   Genitourinary:  Negative for decreased urine volume and hematuria.   Musculoskeletal:  Positive for arthralgias and myalgias. Negative for gait problem.  Skin:  Negative for rash.  Neurological:  Negative for syncope and facial asymmetry.  Psychiatric/Behavioral:  Negative for confusion. The patient is nervous/anxious.   Rest of ROS see pertinent positives and negatives in HPI.  Current Outpatient Medications on File Prior to Visit  Medication Sig Dispense Refill   acetaminophen (TYLENOL) 500 MG tablet Take 1,000 mg by mouth every 6 (six) hours as needed for moderate pain.     EPINEPHrine 0.3 mg/0.3 mL IJ SOAJ injection Inject 0.3 mg into the muscle as needed for anaphylaxis. 2 each 0   escitalopram (LEXAPRO) 20 MG tablet Take 20 mg by mouth at bedtime.     ferrous sulfate 325 (65 FE) MG tablet Take 325 mg by mouth at bedtime.     lamoTRIgine (LAMICTAL) 100 MG tablet TAKE 1 TABLET TWICE DAILY (Patient taking differently: Take 100 mg by mouth at bedtime.) 20 tablet 0   LINZESS 145 MCG CAPS capsule TAKE 1 CAPSULE EVERY DAY AS NEEDED 90 capsule 0   lurasidone (LATUDA) 20 MG TABS tablet TAKE 1 TABLET AT BEDTIME 10 tablet 0   Multiple Vitamins-Minerals (MULTIVITAMIN WITH MINERALS) tablet Take 1 tablet by mouth at bedtime.     nitrofurantoin, macrocrystal-monohydrate, (MACROBID) 100 MG capsule Take 1 capsule (100 mg total) by mouth 2 (two) times daily for 5 days. 10 capsule 0   omeprazole (PRILOSEC) 40 MG capsule TAKE 1 CAPSULE EVERY DAY 90 capsule 1   propranolol (INDERAL) 40 MG tablet Take 1 tablet (40 mg total) by mouth 2 (  two) times daily. (Patient taking differently: Take 40 mg by mouth daily.) 180 tablet 2   No current facility-administered medications on file prior to visit.   Past Medical History:  Diagnosis Date   Anxiety    Bipolar disorder ([place])    Chronic kidney disease    kidney infections   Depression    Fibromyalgia    GERD (gastroesophageal reflux disease)    History of cervical dysplasia    History of exercise intolerance    normal ETT  10-24-2016   History of kidney stones    Hyperlipidemia    Hypertension    Migraines    Osteoarthritis    PCOS (polycystic ovarian syndrome)    Pneumonia 2018   Rash    in area of eswl 04-23-2017   Right ureteral calculus    Sleep apnea    Tachycardia cardiologist-  dr harding   controlled w/ metoprolol   Allergies  Allergen Reactions   Aspirin Shortness Of Breath and Other (See Comments)    Angiodema   Bee Venom Hives    Face swelling and chest pain   Diclofenac Anaphylaxis   Naproxen Swelling    Facial swelling   Nsaids Anaphylaxis    Swelling of eyes mouth and throat difficulty breathing   Prednisone     Worsened depression, suicidal ideation    Terbinafine And Related     Worsened depression, suicidal ideation    Social History   Socioeconomic History   Marital status: Legally Separated    Spouse name: Not on file   Number of children: 2   Years of education: Not on file   Highest education level: Not on file  Occupational History   Occupation: Unemployed  Tobacco Use   Smoking status: Some Days    Packs/day: 0.50    Years: 25.00    Total pack years: 12.50    Types: Cigarettes   Smokeless tobacco: Never   Tobacco comments:    6-7 cig. daily  Vaping Use   Vaping Use: Never used  Substance and Sexual Activity   Alcohol use: No   Drug use: No    Types: Cocaine    Comment: last cocaine use 2014   Sexual activity: Not Currently    Partners: Male    Birth control/protection: None  Other Topics Concern   Not on file  Social History Narrative   She lives at home with her spouse. She does not exercise.   She currently denies recent recreational drug use.   Social Determinants of Health   Financial Resource Strain: Low Risk  (06/01/2021)   Overall Financial Resource Strain (CARDIA)    Difficulty of Paying Living Expenses: Not hard at all  Food Insecurity: No Food Insecurity (06/01/2021)   Hunger Vital Sign    Worried About Running Out of Food in the  Last Year: Never true    Ran Out of Food in the Last Year: Never true  Transportation Needs: No Transportation Needs (06/01/2021)   PRAPARE - Hydrologist (Medical): No    Lack of Transportation (Non-Medical): No  Physical Activity: Inactive (06/01/2021)   Exercise Vital Sign    Days of Exercise per Week: 0 days    Minutes of Exercise per Session: 0 min  Stress: Stress Concern Present (06/01/2021)   [place]    Feeling of Stress : Rather much  Social Connections: Socially Isolated (06/01/2021)   Social Connection and  Isolation Panel [NHANES]    Frequency of Communication with Friends and Family: Once a week    Frequency of Social Gatherings with Friends and Family: Never    Attends Religious Services: Never    Marine scientist or Organizations: No    Attends Archivist Meetings: Never    Marital Status: Separated   Vitals:   06/14/22 1020  BP: 138/80  Pulse: (!) 102  Resp: 16  SpO2: 97%   Wt Readings from Last 3 Encounters:  06/14/22 176 lb 8 oz (80.1 kg)  06/08/22 168 lb 9.6 oz (76.5 kg)  12/28/21 178 lb (80.7 kg)  Body mass index is 27.64 kg/m.  Physical Exam Vitals and nursing note reviewed.  Constitutional:      General: She is not in acute distress.    Appearance: She is well-developed.  HENT:     Head: Normocephalic and atraumatic.     Mouth/Throat:     Mouth: Mucous membranes are moist.     Pharynx: Oropharynx is clear.  Eyes:     Conjunctiva/sclera: Conjunctivae normal.  Cardiovascular:     Rate and Rhythm: Regular rhythm. Tachycardia present.     Pulses:          Posterior tibial pulses are 2+ on the right side and 2+ on the left side.     Heart sounds: No murmur heard. Pulmonary:     Effort: Pulmonary effort is normal. No respiratory distress.     Breath sounds: Normal breath sounds.  Abdominal:     Palpations: Abdomen is soft. There is no  hepatomegaly or mass.     Tenderness: There is no abdominal tenderness.  Musculoskeletal:     Comments: Tender trigger points on upper and lower back.  Lymphadenopathy:     Cervical: No cervical adenopathy.  Skin:    General: Skin is warm.     Findings: No erythema or rash.  Neurological:     General: No focal deficit present.     Mental Status: She is alert and oriented to person, place, and time.     Cranial Nerves: No cranial nerve deficit.     Gait: Gait normal.  Psychiatric:        Mood and Affect: Mood is anxious.   ASSESSMENT AND PLAN:  [name] [name] was seen today for follow-up.  Diagnoses and all orders for this visit:  Essential hypertension BP today adequately controlled. She is reporting elevated BP readings when donating plasma. Recommend taking propranolol 40 mg twice daily instead as needed. Monitor BP at home. Continue low-salt/DASH diet.  Fibromyalgia Problem is stable. Continue adequate sleep hygiene. Recommend low impact exercise. Continue Lyrica 300 mg twice daily.  GERD Problem is well controlled. Continue omeprazole 40 mg daily. Continue GERD precautions.  Constipation Having some diarrhea lately due to anxiety. Continue Linzess 145 mcg daily as needed for constipation. Adequate fiber and fluid intake.  She has a CPE scheduled next months, so prefers to hold on labs until then.  Return for Keep appt in 06/2022.Marland Kitchen  Swati Granberry G. Martinique, MD  [facility]. [place] office.

## 2022-06-14 ENCOUNTER — Telehealth: Payer: Self-pay

## 2022-06-14 ENCOUNTER — Ambulatory Visit: Payer: Medicare HMO

## 2022-06-14 ENCOUNTER — Ambulatory Visit (INDEPENDENT_AMBULATORY_CARE_PROVIDER_SITE_OTHER): Payer: Medicare HMO | Admitting: Family Medicine

## 2022-06-14 ENCOUNTER — Encounter: Payer: Self-pay | Admitting: Family Medicine

## 2022-06-14 VITALS — BP 138/80 | HR 102 | Resp 16 | Ht 67.0 in | Wt 176.5 lb

## 2022-06-14 DIAGNOSIS — I10 Essential (primary) hypertension: Secondary | ICD-10-CM

## 2022-06-14 DIAGNOSIS — M797 Fibromyalgia: Secondary | ICD-10-CM

## 2022-06-14 DIAGNOSIS — K219 Gastro-esophageal reflux disease without esophagitis: Secondary | ICD-10-CM

## 2022-06-14 DIAGNOSIS — K5900 Constipation, unspecified: Secondary | ICD-10-CM

## 2022-06-14 MED ORDER — PREGABALIN 300 MG PO CAPS: 300.0000 mg | ORAL_CAPSULE | Freq: Two times a day (BID) | ORAL | 3 refills | Status: DC

## 2022-06-14 NOTE — Assessment & Plan Note (Signed)
Problem is stable. Continue adequate sleep hygiene. Recommend low impact exercise. Continue Lyrica 300 mg twice daily.

## 2022-06-14 NOTE — Assessment & Plan Note (Signed)
BP today adequately controlled. She is reporting elevated BP readings when donating plasma. Recommend taking propranolol 40 mg twice daily instead as needed. Monitor BP at home. Continue low-salt/DASH diet.

## 2022-06-14 NOTE — Assessment & Plan Note (Signed)
Having some diarrhea lately due to anxiety. Continue Linzess 145 mcg daily as needed for constipation. Adequate fiber and fluid intake.

## 2022-06-14 NOTE — Patient Instructions (Addendum)
A few things to remember from today's visit:   Gastroesophageal reflux disease, unspecified whether esophagitis present  Polyarthralgia  Vitamin D deficiency, unspecified  Fibromyalgia - Plan: pregabalin (LYRICA) 300 MG capsule  Essential hypertension  If you need refills please call your pharmacy. Do not use My Chart to request refills or for acute issues that need immediate attention.   No changes today. I will see you for your physical next months and will do fasting labs.  Please be sure medication list is accurate. If a new problem present, please set up appointment sooner than planned today.

## 2022-06-14 NOTE — Telephone Encounter (Signed)
Unsuccessful attempt to reach patient on preferred number listed in notes for scheduled AWV. Left message on voicemail okay to reschedule. 

## 2022-06-14 NOTE — Assessment & Plan Note (Signed)
Problem is well controlled. Continue omeprazole 40 mg daily. Continue GERD precautions.

## 2022-06-19 ENCOUNTER — Encounter (HOSPITAL_BASED_OUTPATIENT_CLINIC_OR_DEPARTMENT_OTHER)
Admission: RE | Admit: 2022-06-19 | Discharge: 2022-06-19 | Disposition: A | Discharge status: Home / Self Care | Payer: Medicare HMO | Source: Ambulatory Visit | Attending: Surgery | Admitting: Surgery

## 2022-06-19 ENCOUNTER — Ambulatory Visit
Admission: RE | Admit: 2022-06-19 | Discharge: 2022-06-19 | Disposition: A | Discharge status: Home / Self Care | Payer: Medicare HMO | Source: Ambulatory Visit | Attending: Surgery | Admitting: Surgery

## 2022-06-19 DIAGNOSIS — R928 Other abnormal and inconclusive findings on diagnostic imaging of breast: Secondary | ICD-10-CM | POA: Diagnosis not present

## 2022-06-19 DIAGNOSIS — Z01812 Encounter for preprocedural laboratory examination: Secondary | ICD-10-CM | POA: Insufficient documentation

## 2022-06-19 DIAGNOSIS — D242 Benign neoplasm of left breast: Secondary | ICD-10-CM

## 2022-06-19 NOTE — Progress Notes (Signed)
       Patient Instructions  The night before surgery:  No food after midnight. ONLY clear liquids after midnight  The day of surgery (if you do NOT have diabetes):  Drink ONE (1) Pre-Surgery Clear Ensure as directed.   This drink was given to you during your hospital  pre-op appointment visit. The pre-op nurse will instruct you on the time to drink the  Pre-Surgery Ensure depending on your surgery time. Finish the drink at the designated time by the pre-op nurse.  Nothing else to drink after completing the  Pre-Surgery Clear Ensure.  The day of surgery (if you have diabetes): Drink ONE (1) Gatorade 2 (G2) as directed. This drink was given to you during your hospital  pre-op appointment visit.  The pre-op nurse will instruct you on the time to drink the   Gatorade 2 (G2) depending on your surgery time. Color of the Gatorade may vary. Red is not allowed. Nothing else to drink after completing the  Gatorade 2 (G2).         If you have questions, please contact your surgeon's office. Pt provided with CHG soap and verbalized understanding.

## 2022-06-20 ENCOUNTER — Ambulatory Visit (HOSPITAL_BASED_OUTPATIENT_CLINIC_OR_DEPARTMENT_OTHER): Payer: Medicare HMO | Admitting: Certified Registered"

## 2022-06-20 ENCOUNTER — Other Ambulatory Visit: Payer: Self-pay

## 2022-06-20 ENCOUNTER — Ambulatory Visit (HOSPITAL_BASED_OUTPATIENT_CLINIC_OR_DEPARTMENT_OTHER)
Admission: RE | Admit: 2022-06-20 | Discharge: 2022-06-20 | Disposition: A | Discharge status: Home / Self Care | Payer: Medicare HMO | Attending: Surgery | Admitting: Surgery

## 2022-06-20 ENCOUNTER — Encounter (HOSPITAL_BASED_OUTPATIENT_CLINIC_OR_DEPARTMENT_OTHER)
Admission: RE | Disposition: A | Discharge status: Home / Self Care | Payer: Self-pay | Source: Home / Self Care | Attending: Surgery

## 2022-06-20 ENCOUNTER — Encounter (HOSPITAL_BASED_OUTPATIENT_CLINIC_OR_DEPARTMENT_OTHER): Payer: Self-pay | Admitting: Surgery

## 2022-06-20 ENCOUNTER — Ambulatory Visit
Admission: RE | Admit: 2022-06-20 | Discharge: 2022-06-20 | Disposition: A | Discharge status: Home / Self Care | Payer: Medicare HMO | Source: Ambulatory Visit | Attending: Surgery | Admitting: Surgery

## 2022-06-20 DIAGNOSIS — I10 Essential (primary) hypertension: Secondary | ICD-10-CM | POA: Insufficient documentation

## 2022-06-20 DIAGNOSIS — N644 Mastodynia: Secondary | ICD-10-CM | POA: Diagnosis not present

## 2022-06-20 DIAGNOSIS — F419 Anxiety disorder, unspecified: Secondary | ICD-10-CM | POA: Diagnosis not present

## 2022-06-20 DIAGNOSIS — F319 Bipolar disorder, unspecified: Secondary | ICD-10-CM | POA: Diagnosis not present

## 2022-06-20 DIAGNOSIS — F17210 Nicotine dependence, cigarettes, uncomplicated: Secondary | ICD-10-CM | POA: Insufficient documentation

## 2022-06-20 DIAGNOSIS — N6452 Nipple discharge: Secondary | ICD-10-CM | POA: Diagnosis not present

## 2022-06-20 DIAGNOSIS — Z803 Family history of malignant neoplasm of breast: Secondary | ICD-10-CM | POA: Insufficient documentation

## 2022-06-20 DIAGNOSIS — Z8249 Family history of ischemic heart disease and other diseases of the circulatory system: Secondary | ICD-10-CM | POA: Diagnosis not present

## 2022-06-20 DIAGNOSIS — N6092 Unspecified benign mammary dysplasia of left breast: Secondary | ICD-10-CM | POA: Diagnosis not present

## 2022-06-20 DIAGNOSIS — N6489 Other specified disorders of breast: Secondary | ICD-10-CM | POA: Diagnosis not present

## 2022-06-20 DIAGNOSIS — D242 Benign neoplasm of left breast: Secondary | ICD-10-CM

## 2022-06-20 DIAGNOSIS — G4730 Sleep apnea, unspecified: Secondary | ICD-10-CM | POA: Insufficient documentation

## 2022-06-20 DIAGNOSIS — M797 Fibromyalgia: Secondary | ICD-10-CM | POA: Diagnosis not present

## 2022-06-20 DIAGNOSIS — N62 Hypertrophy of breast: Secondary | ICD-10-CM

## 2022-06-20 DIAGNOSIS — R928 Other abnormal and inconclusive findings on diagnostic imaging of breast: Secondary | ICD-10-CM | POA: Diagnosis not present

## 2022-06-20 DIAGNOSIS — R920 Mammographic microcalcification found on diagnostic imaging of breast: Secondary | ICD-10-CM | POA: Diagnosis not present

## 2022-06-20 HISTORY — PX: BREAST LUMPECTOMY WITH RADIOACTIVE SEED LOCALIZATION: SHX6424

## 2022-06-20 SURGERY — BREAST LUMPECTOMY WITH RADIOACTIVE SEED LOCALIZATION
Anesthesia: General | Site: Breast | Laterality: Left

## 2022-06-20 MED ORDER — EPHEDRINE SULFATE (PRESSORS) 50 MG/ML IJ SOLN
INTRAMUSCULAR | Status: DC | PRN
  Administered 2022-06-20 (×2): 10 mg via INTRAVENOUS
  Administered 2022-06-20: 5 mg via INTRAVENOUS
  Administered 2022-06-20: 10 mg via INTRAVENOUS
  Administered 2022-06-20: 5 mg via INTRAVENOUS
  Administered 2022-06-20: 10 mg via INTRAVENOUS

## 2022-06-20 MED ORDER — PHENYLEPHRINE HCL (PRESSORS) 10 MG/ML IV SOLN
INTRAVENOUS | Status: DC | PRN
  Administered 2022-06-20: 80 ug via INTRAVENOUS

## 2022-06-20 MED ORDER — ACETAMINOPHEN 500 MG PO TABS
ORAL_TABLET | ORAL | Status: AC
  Filled 2022-06-20: qty 2

## 2022-06-20 MED ORDER — LACTATED RINGERS IV SOLN: INTRAVENOUS | Status: DC

## 2022-06-20 MED ORDER — ACETAMINOPHEN 500 MG PO TABS: 1000.0000 mg | ORAL_TABLET | ORAL | Status: DC

## 2022-06-20 MED ORDER — FENTANYL CITRATE (PF) 100 MCG/2ML IJ SOLN
INTRAMUSCULAR | Status: AC
  Filled 2022-06-20: qty 2

## 2022-06-20 MED ORDER — CEFAZOLIN SODIUM-DEXTROSE 2-4 GM/100ML-% IV SOLN
INTRAVENOUS | Status: AC
  Filled 2022-06-20: qty 100

## 2022-06-20 MED ORDER — GLYCOPYRROLATE 0.2 MG/ML IJ SOLN
INTRAMUSCULAR | Status: DC | PRN
  Administered 2022-06-20: .2 mg via INTRAVENOUS

## 2022-06-20 MED ORDER — ONDANSETRON HCL 4 MG/2ML IJ SOLN: 4.0000 mg | Freq: Once | INTRAMUSCULAR | Status: DC | PRN

## 2022-06-20 MED ORDER — PHENYLEPHRINE 80 MCG/ML (10ML) SYRINGE FOR IV PUSH (FOR BLOOD PRESSURE SUPPORT)
PREFILLED_SYRINGE | INTRAVENOUS | Status: AC
  Filled 2022-06-20: qty 20

## 2022-06-20 MED ORDER — ONDANSETRON HCL 4 MG/2ML IJ SOLN
INTRAMUSCULAR | Status: DC | PRN
  Administered 2022-06-20: 4 mg via INTRAVENOUS

## 2022-06-20 MED ORDER — FENTANYL CITRATE (PF) 100 MCG/2ML IJ SOLN
25.0000 ug | INTRAMUSCULAR | Status: DC | PRN
  Administered 2022-06-20 (×2): 50 ug via INTRAVENOUS

## 2022-06-20 MED ORDER — EPHEDRINE 5 MG/ML INJ
INTRAVENOUS | Status: AC
  Filled 2022-06-20: qty 5

## 2022-06-20 MED ORDER — AMISULPRIDE (ANTIEMETIC) 5 MG/2ML IV SOLN: 10.0000 mg | Freq: Once | INTRAVENOUS | Status: DC | PRN

## 2022-06-20 MED ORDER — MIDAZOLAM HCL 5 MG/5ML IJ SOLN
INTRAMUSCULAR | Status: DC | PRN
  Administered 2022-06-20: 2 mg via INTRAVENOUS

## 2022-06-20 MED ORDER — MIDAZOLAM HCL 2 MG/2ML IJ SOLN
INTRAMUSCULAR | Status: AC
  Filled 2022-06-20: qty 2

## 2022-06-20 MED ORDER — OXYCODONE HCL 5 MG PO TABS: 5.0000 mg | ORAL_TABLET | Freq: Four times a day (QID) | ORAL | 0 refills | Status: DC | PRN

## 2022-06-20 MED ORDER — CHLORHEXIDINE GLUCONATE CLOTH 2 % EX PADS: 6.0000 | MEDICATED_PAD | Freq: Once | CUTANEOUS | Status: DC

## 2022-06-20 MED ORDER — OXYCODONE HCL 5 MG PO TABS: 5.0000 mg | ORAL_TABLET | Freq: Once | ORAL | Status: DC | PRN

## 2022-06-20 MED ORDER — BUPIVACAINE-EPINEPHRINE (PF) 0.25% -1:200000 IJ SOLN
INTRAMUSCULAR | Status: DC | PRN
  Administered 2022-06-20: 17 mL

## 2022-06-20 MED ORDER — OXYCODONE HCL 5 MG/5ML PO SOLN: 5.0000 mg | Freq: Once | ORAL | Status: DC | PRN

## 2022-06-20 MED ORDER — EPHEDRINE 5 MG/ML INJ
INTRAVENOUS | Status: AC
  Filled 2022-06-20: qty 10

## 2022-06-20 MED ORDER — LIDOCAINE HCL (CARDIAC) PF 100 MG/5ML IV SOSY
PREFILLED_SYRINGE | INTRAVENOUS | Status: DC | PRN
  Administered 2022-06-20: 100 mg via INTRAVENOUS

## 2022-06-20 MED ORDER — SODIUM CHLORIDE 0.9 % IV SOLN
INTRAVENOUS | Status: DC | PRN
  Administered 2022-06-20: 500 mL

## 2022-06-20 MED ORDER — PROPOFOL 10 MG/ML IV BOLUS
INTRAVENOUS | Status: DC | PRN
  Administered 2022-06-20: 200 mg via INTRAVENOUS

## 2022-06-20 MED ORDER — FENTANYL CITRATE (PF) 100 MCG/2ML IJ SOLN
INTRAMUSCULAR | Status: DC | PRN
Start: 2022-06-20 — End: 2022-06-20
  Administered 2022-06-20: 50 ug via INTRAVENOUS
  Administered 2022-06-20: 25 ug via INTRAVENOUS

## 2022-06-20 MED ORDER — CEFAZOLIN SODIUM-DEXTROSE 2-4 GM/100ML-% IV SOLN
2.0000 g | INTRAVENOUS | Status: AC
  Administered 2022-06-20: 2 g via INTRAVENOUS

## 2022-06-20 SURGICAL SUPPLY — 53 items
ADH SKN CLS APL DERMABOND .7 (GAUZE/BANDAGES/DRESSINGS) ×1
APL PRP STRL LF DISP 70% ISPRP (MISCELLANEOUS) ×1
APPLIER CLIP 9.375 MED OPEN (MISCELLANEOUS)
APR CLP MED 9.3 20 MLT OPN (MISCELLANEOUS)
BINDER BREAST LRG (GAUZE/BANDAGES/DRESSINGS) IMPLANT
BINDER BREAST MEDIUM (GAUZE/BANDAGES/DRESSINGS) IMPLANT
BINDER BREAST XLRG (GAUZE/BANDAGES/DRESSINGS) ×1 IMPLANT
BINDER BREAST XXLRG (GAUZE/BANDAGES/DRESSINGS) IMPLANT
BLADE SURG 15 STRL LF DISP TIS (BLADE) ×1 IMPLANT
BLADE SURG 15 STRL SS (BLADE) ×2
CANISTER SUC SOCK COL 7IN (MISCELLANEOUS) IMPLANT
CANISTER SUCT 1200ML W/VALVE (MISCELLANEOUS) IMPLANT
CHLORAPREP W/TINT 26 (MISCELLANEOUS) ×2 IMPLANT
CLIP APPLIE 9.375 MED OPEN (MISCELLANEOUS) IMPLANT
COVER BACK TABLE 60X90IN (DRAPES) ×2 IMPLANT
COVER MAYO STAND STRL (DRAPES) ×2 IMPLANT
COVER PROBE W GEL 5X96 (DRAPES) ×2 IMPLANT
DERMABOND ADVANCED (GAUZE/BANDAGES/DRESSINGS) ×1
DERMABOND ADVANCED .7 DNX12 (GAUZE/BANDAGES/DRESSINGS) ×1 IMPLANT
DRAPE LAPAROSCOPIC ABDOMINAL (DRAPES) IMPLANT
DRAPE LAPAROTOMY 100X72 PEDS (DRAPES) ×2 IMPLANT
DRAPE UTILITY XL STRL (DRAPES) ×2 IMPLANT
ELECT COATED BLADE 2.86 ST (ELECTRODE) ×2 IMPLANT
ELECT REM PT RETURN 9FT ADLT (ELECTROSURGICAL) ×2
ELECTRODE REM PT RTRN 9FT ADLT (ELECTROSURGICAL) ×1 IMPLANT
GLOVE BIOGEL PI IND STRL 7.5 (GLOVE) IMPLANT
GLOVE BIOGEL PI IND STRL 8 (GLOVE) ×1 IMPLANT
GLOVE BIOGEL PI INDICATOR 7.5 (GLOVE) ×1
GLOVE BIOGEL PI INDICATOR 8 (GLOVE) ×1
GLOVE ECLIPSE 8.0 STRL XLNG CF (GLOVE) ×2 IMPLANT
GOWN STRL REUS W/ TWL LRG LVL3 (GOWN DISPOSABLE) ×2 IMPLANT
GOWN STRL REUS W/ TWL XL LVL3 (GOWN DISPOSABLE) ×1 IMPLANT
GOWN STRL REUS W/TWL LRG LVL3 (GOWN DISPOSABLE) ×4
GOWN STRL REUS W/TWL XL LVL3 (GOWN DISPOSABLE) ×2
HEMOSTAT ARISTA ABSORB 3G PWDR (HEMOSTASIS) IMPLANT
HEMOSTAT SNOW SURGICEL 2X4 (HEMOSTASIS) IMPLANT
KIT MARKER MARGIN INK (KITS) ×2 IMPLANT
NDL HYPO 25X1 1.5 SAFETY (NEEDLE) ×1 IMPLANT
NEEDLE HYPO 25X1 1.5 SAFETY (NEEDLE) ×2 IMPLANT
NS IRRIG 1000ML POUR BTL (IV SOLUTION) ×2 IMPLANT
PACK BASIN DAY SURGERY FS (CUSTOM PROCEDURE TRAY) ×2 IMPLANT
PENCIL SMOKE EVACUATOR (MISCELLANEOUS) ×2 IMPLANT
SLEEVE SCD COMPRESS KNEE MED (STOCKING) ×2 IMPLANT
SPIKE FLUID TRANSFER (MISCELLANEOUS) IMPLANT
SPONGE T-LAP 4X18 ~~LOC~~+RFID (SPONGE) ×2 IMPLANT
SUT MNCRL AB 4-0 PS2 18 (SUTURE) ×2 IMPLANT
SUT SILK 2 0 SH (SUTURE) IMPLANT
SUT VICRYL 3-0 CR8 SH (SUTURE) ×2 IMPLANT
SYR CONTROL 10ML LL (SYRINGE) ×2 IMPLANT
TOWEL GREEN STERILE FF (TOWEL DISPOSABLE) ×2 IMPLANT
TRAY FAXITRON CT DISP (TRAY / TRAY PROCEDURE) ×2 IMPLANT
TUBE CONNECTING 20X1/4 (TUBING) IMPLANT
YANKAUER SUCT BULB TIP NO VENT (SUCTIONS) IMPLANT

## 2022-06-20 NOTE — Interval H&P Note (Signed)
History and Physical Interval Note:  06/20/2022 9:02 AM  [name] [name]  has presented today for surgery, with the diagnosis of PAPILLOMA LEFT BREAST.  The various methods of treatment have been discussed with the patient and family. After consideration of risks, benefits and other options for treatment, the patient has consented to  Procedure(s): LEFT BREAST LUMPECTOMY WITH RADIOACTIVE SEED LOCALIZATION (Left) as a surgical intervention.  The patient's history has been reviewed, patient examined, no change in status, stable for surgery.  I have reviewed the patient's chart and labs.  Questions were answered to the patient's satisfaction.     [place]

## 2022-06-20 NOTE — H&P (Signed)
Chief Complaint: Left Breast Intraductal Papilloma   History of Present Illness: CLEORA KARNIK is a 51 y.o. female who is seen today as an office consultation for evaluation of Left Breast Intraductal Papilloma .   Patient is here to evaluate recent findings of [place] on the right and left breast papilloma upper outer quadrant noted by biopsy. She felt a mass and had bilateral breast pain with nipple discharge mostly on the right. She also had discomfort and swelling in the left. Diagnostic mammogram revealed a mass left breast upper outer quadrant measuring almost a centimeter. Core biopsy showed this to be papillomata's in nature. She also had an MRI which showed ALH on the right side measuring about 3 and half centimeters in the upper quadrant. Core biopsy was done to verify this. She has no family history of breast cancer except her mother who was in her 37s. She also had a history of nipple discharge right more than left that seem to stop for now. Also complains of right nipple itching she states the discharge is quite minimal. He does not soil her bra or under garments.  Review of Systems: A complete review of systems was obtained from the patient. I have reviewed this information and discussed as appropriate with the patient. See HPI as well for other ROS.    Medical History: Past Medical History:  Diagnosis Date   Anxiety   Arthritis   GERD (gastroesophageal reflux disease)   Sleep apnea   There is no problem list on file for this patient.  Past Surgical History:  Procedure Laterality Date   LAPAROSCOPY DIAGNOSTIC 07/30/2012  Dr. Sharen Heck   Laparoscopic Cholecystectomy 03/13/2013  Dr. Evlyn Courier   Anterior Cervical Decompression/Discectomy Fusion Plating Bonegraft Cervical Five-Six 09/01/2015  Dr. Jacinto Reap. [place]   Laparoscopic Assisted Vaginal Hysterectomy with Salpingectomy 05/08/2018  Dr. Sharen Heck   Left Breast Papilloma Surgery   Right Breast Fibrocystic Mass  Removed    Allergies  Allergen Reactions   Aspirin Other (See Comments) and Shortness Of Breath  Angiodema   Prednisone Unknown  Worsened depression, suicidal ideation   Venom-Honey Bee Hives  Face swelling and chest pain   Nsaids (Non-Steroidal Anti-Inflammatory Drug) Swelling   Current Outpatient Medications on File Prior to Visit  Medication Sig Dispense Refill   escitalopram oxalate (LEXAPRO) 10 MG tablet escitalopram 10 mg tablet   lamoTRIgine (LAMICTAL) 100 MG tablet lamotrigine 100 mg tablet   lurasidone (LATUDA) 20 mg tablet Latuda 20 mg tablet   pregabalin (LYRICA) 300 MG capsule pregabalin 300 mg capsule   propranoloL (INDERAL) 40 MG tablet propranolol 40 mg tablet   LINZESS 145 mcg capsule   No current facility-administered medications on file prior to visit.   Family History  Problem Relation Age of Onset   Obesity Mother   High blood pressure (Hypertension) Mother   Hyperlipidemia (Elevated cholesterol) Mother   Diabetes Mother   Breast cancer Mother   Obesity Father   High blood pressure (Hypertension) Father   Hyperlipidemia (Elevated cholesterol) Father   Diabetes Father    Social History   Tobacco Use  Smoking Status Every Day   Packs/day: 0.50   Types: Cigarettes  Smokeless Tobacco Never    Social History   Socioeconomic History   Marital status: Legally Separated  Tobacco Use   Smoking status: Every Day  Packs/day: 0.50  Types: Cigarettes   Smokeless tobacco: Never  Substance and Sexual Activity   Alcohol use: Never   Drug use: Never  Objective:   Vitals:  04/11/22 0933  BP: 122/80  Pulse: 81  Temp: 36.8 C (98.3 F)  SpO2: 96%  Weight: 83.1 kg (183 lb 3.2 oz)  Height: 170.2 cm ('5\' 7"'$ )   Body mass index is 28.69 kg/m.  Physical Exam HENT:  Head: Normocephalic.  Eyes:  Pupils: Pupils are equal, round, and reactive to light.  Cardiovascular:  Rate and Rhythm: Normal rate.  Pulmonary:  Effort: Pulmonary effort is  normal.  Chest:  Breasts: Right: Tenderness present. No inverted nipple, mass, nipple discharge or skin change.  Left: Mass and tenderness present. No inverted nipple, nipple discharge or skin change.   Comments: 1 cm mobile mass  Musculoskeletal:  General: Normal range of motion.  Cervical back: Normal range of motion.  Lymphadenopathy:  Upper Body:  Right upper body: No supraclavicular or axillary adenopathy.  Left upper body: No supraclavicular or axillary adenopathy.  Skin: General: Skin is warm.  Neurological:  General: No focal deficit present.  Mental Status: She is alert.     Labs, Imaging and Diagnostic Testing:  CLINICAL DATA: 51 year old female for delayed 2 year follow-up of RIGHT breast mass (now over 3-1/2 years), new spontaneous clear RIGHT nipple discharge for several months and new LEFT nipple pain.   EXAM: DIGITAL DIAGNOSTIC BILATERAL MAMMOGRAM WITH TOMOSYNTHESIS AND CAD; ULTRASOUND LEFT BREAST LIMITED; ULTRASOUND RIGHT BREAST LIMITED   TECHNIQUE: Bilateral digital diagnostic mammography and breast tomosynthesis was performed. The images were evaluated with computer-aided detection.; Targeted ultrasound examination of the left breast was performed.; Targeted ultrasound examination of the right breast was performed   COMPARISON: Previous exam(s).   ACR Breast Density Category b: There are scattered areas of fibroglandular density.   FINDINGS: 2D/3D full field views of both breasts and spot compression view of the RIGHT breast demonstrate a stable circumscribed oval mass within the slightly OUTER RIGHT breast. No new or suspicious mammographic findings are noted within either breast.   On physical exam, clear RIGHT nipple discharge was elicited.   Targeted ultrasound is performed, showing the following:   RIGHT breast:   No sonographic abnormalities within the RETROAREOLAR RIGHT breast.   A stable 0.4 x 0.2 x 0.5 cm circumscribed oval  hypoechoic mass at the 9 o'clock position, decreased in size from 2019 and considered benign.   No abnormal RIGHT axillary lymph nodes are identified.   LEFT breast:   A 0.6 x 0.6 x 1.4 cm slightly irregular hypoechoic mass at the 1 o'clock position of the LEFT breast 2 cm from the nipple is noted.   No other sonographic abnormalities are identified in the area of LEFT breast patient's pain.   No abnormal LEFT axillary lymph nodes are identified.   IMPRESSION: 1. Indeterminate 1.4 cm mass at the 1 o'clock position of the LEFT breast. Tissue sampling is recommended. No abnormal appearing LEFT axillary lymph nodes. 2. Clear RIGHT nipple discharge without mammographic or sonographic correlate. Breast MRI is recommended. 3. Slightly smaller mass in the OUTER RIGHT breast since 2019, compatible with a benign process. 4. No other significant abnormalities identified.   RECOMMENDATION: 1. Bilateral breast MRI for further evaluation of new clear RIGHT nipple discharge. 2. Ultrasound-guided biopsy of 1.4 cm LEFT breast mass, which will be scheduled.   I have discussed the findings and recommendations with the patient. If applicable, a reminder letter will be sent to the patient regarding the next appointment.   BI-RADS CATEGORY 4: Suspicious.     Electronically Signed By: Margarette Canada M.D. On:  01/13/2022 11:05 ADDENDUM REPORT: 02/03/2022 16:34   ADDENDUM: An addendum is made to the clinical data portion of the exam due to a typographical error.   This should read as:   CLINICAL DATA: 51 year old female with strong family history of breast cancer including mother having been diagnosed with breast cancer at age 30. Prior benign remote bilateral breast biopsies. The patient has had bilateral nonfocal breast pain for approximately 6 months as well as clear spontaneous right nipple discharge also for approximately 6 months.     Electronically Signed By: Everlean Alstrom  M.D. On: 02/03/2022 16:34    Addended by Everlean Alstrom, MD on 02/03/2022  4:37 PM   Study Result  Narrative & Impression  CLINICAL DATA: 51 year old female with strong family history of breast cancer including mother having been diagnosed with breast cancer at age 32. Prior benign remote bilateral breast biopsies. The patient has had bilateral nonfocal breast pain for approximately 6 months as well as clear spontaneous right nipple discharge also for approximately 6 months.   Recent diagnostic mammography and ultrasound 01/13/2022 demonstrated an indeterminate 1.4 cm mass at the 1 o'clock position of the left breast with ultrasound-guided core biopsy recommended. In addition, breast MRI was recommended to further evaluate history of clear right nipple discharge.   EXAM: BILATERAL BREAST MRI WITH AND WITHOUT CONTRAST   TECHNIQUE: Multiplanar, multisequence MR images of both breasts were obtained prior to and following the intravenous administration of ml of Gadavist   Three-dimensional MR images were rendered by post-processing of the original MR data on an independent workstation. The three-dimensional MR images were interpreted, and findings are reported in the following complete MRI report for this study. Three dimensional images were evaluated at the independent interpreting workstation using the DynaCAD thin client.   COMPARISON: Previous exams.   FINDINGS: Breast composition: b. Scattered fibroglandular tissue.   Background parenchymal enhancement: Moderate to marked.   Right breast: There is linear oriented nodular enhancement/small enhancing masses spanning 3.5 cm in the upper-outer right breast (images [id number]). These masses appear more discrete when compared to the patient's background nodular enhancement pattern.   Left breast: There is an irregular enhancing 0.9 cm mass in the upper-outer left breast at the approximate 1 o'clock position (image 79). This  is felt to correspond well with the mass seen at the 1 o'clock position in the left breast on recent ultrasound. There is background nodular enhancement pattern, however no additional discrete suspicious enhancing masses or abnormal areas of enhancement.   Lymph nodes: No abnormal appearing lymph nodes.   Ancillary findings: None.   IMPRESSION: 1. Indeterminate linear oriented nodular enhancement/small enhancing masses spanning 3.5 cm in the upper-outer right breast.   2. Suspicious 0.9 cm enhancing mass in the upper-outer left breast. This corresponds well with the mass seen on recent left breast ultrasound dated 01/13/2022.   RECOMMENDATION: 1. Recommend MRI guided biopsy of the anterior and posterior portions of the linear nodular enhancement in the upper-outer right breast.   2. Recommend ultrasound-guided core biopsy of the 1.4 cm mass at the 1 o'clock position of the left breast seen on recent ultrasound.   BI-RADS CATEGORY 4: Suspicious.   Electronically Signed: By: Everlean Alstrom M.D. On: 02/03/2022 13:29   Diagnosis 1. Breast, right, needle core biopsy, posterior, UOQ, barbell clip - FOCAL ATYPICAL LOBULAR HYPERPLASIA. - FIBROCYSTIC CHANGES WITH ADENOSIS AND CALCIFICATIONS. - FIBROADENOMATOUS NODULE, 0.4 CM. - SEE NOTE. 2. Breast, right, needle core biopsy, anterior, UOQ, cylinder clip - FOCAL ATYPICAL  LOBULAR HYPERPLASIA. - FIBROCYSTIC CHANGES WITH ADENOSIS AND CALCIFICATIONS. - SEE NOTE. Diagnosis Note 1. & 2. Immunohistochemistry for E-cadherin supports the diagnosis. Kashikar agrees. Called to the [facility] on 03/27/2022 and 03/28/2022. 2. Called to the [facility] on 03/27/2022. Claudette Laws MD Pathologist, Electronic Signature (Case signed 03/28/2022)  Assessment and Plan:   Diagnoses and all orders for this visit:  Papilloma of left breast  Atypical lobular hyperplasia of breast    Discussed high risk findings  of atypical lobular hyperplasia as well as a peripherally located 1 cm papilloma on the left. Given the size and location of the papilloma, I recommend left breast lumpectomy due to potential increase of grade risk of these lesions versus a small or more centrally located papilloma. Also, discussed the significance of ALH noted on biopsy and potential lifetime risk of breast cancer given all of her risk factors being over 20% via the TC calculator. Discussed risk reduction with medications as well as risk reducing surgery. There is no family history of this but genetics was also discussed. She would like proceed with left breast seed localized lumpectomy.The procedure has been discussed with the patient. Alternatives to surgery have been discussed with the patient. Risks of surgery include bleeding, Infection, Seroma formation, death, and the need for further surgery. The patient understands and wishes to proceed.  Discussed yearly mammograms, MRI and strategies for surveillance and/or risk reduction as stated above. Discussed use of tamoxifen and/or raloxifene as well. She will think these over and discuss this at later time. She wishes to proceed with left breast lumpectomy.  No follow-ups on file.  Kennieth Francois, MD

## 2022-06-20 NOTE — Anesthesia Procedure Notes (Signed)
Procedure Name: LMA Insertion Date/Time: 06/20/2022 10:11 AM  Performed by: Lavonia Dana, CRNAPre-anesthesia Checklist: Patient identified, Emergency Drugs available, Suction available and Patient being monitored Patient Re-evaluated:Patient Re-evaluated prior to induction Oxygen Delivery Method: Circle system utilized Preoxygenation: Pre-oxygenation with 100% oxygen Induction Type: IV induction Ventilation: Mask ventilation without difficulty LMA: LMA inserted LMA Size: 4.0 Number of attempts: 1 Airway Equipment and Method: Bite block Placement Confirmation: positive ETCO2 Tube secured with: Tape Dental Injury: Teeth and Oropharynx as per pre-operative assessment

## 2022-06-20 NOTE — Op Note (Signed)
Preoperative diagnosis: Left breast papilloma with hyperplasia upper outer quadrant  Postoperative diagnosis: Same  Procedure: Left breast seed localized lumpectomy  Surgeon: Erroll Luna, MD  Anesthesia: LMA with 0.25% Marcaine with epinephrine  EBL: Minimal  IV fluids: Per anesthesia record  Drains: None  Specimen: Left breast tissue in seed and clip verified by Faxitron  Indications for procedure: The patient presents for left breast lumpectomy after core biopsy revealed a papilloma with hyperplasia.  We discussed the pros and cons of removing this.  We discussed observation.  She also had on previous work-up an area in her right breast measuring about 3-1/2 cm.  Core biopsy showed focal areas of atypical lobular hyperplasia.  We discussed the pros and cons of removing this.  We discussed the relative risk malignancy which is low with the majority of lumpectomies in the circumstance.  Due to the hyperplasia that we felt the papilloma was more of an issue and recommended lumpectomy.  We discussed risk reducing measures giving her atypical lobular hyperplasia as well as the pros and cons of bilateral lumpectomy.  After discussion of the above she agreed to proceed with left breast seed lumpectomy.The procedure has been discussed with the patient. Alternatives to surgery have been discussed with the patient.  Risks of surgery include bleeding,  Infection,  Seroma formation, death,  and the need for further surgery.   The patient understands and wishes to proceed.      Description of procedure: The patient was met in the holding area and questions were answered.  Left breast was marked as correct site.  Films were available for review.  She was then taken back to the operating room.  She was placed upon upon the OR table.  After induction of general anesthesia, left breast was prepped and draped in sterile fashion and timeout performed.  The appropriate patient, site and procedure verified.   Neoprobe used to identify the seed left breast upper outer quadrant.  After infiltration skin local anesthetic, a curvilinear incision was made over the signal.  Dissection was carried down all tissue and the seed and clip were excised with a grossly negative margin.  The Faxitron image revealed the seed and clip to be present.  The cavity was irrigated.  Is made hemostatic with cautery.  Local anesthetic was infiltrated in the calf was closed with 3-0 Vicryl and 4-0 Monocryl.  Dermabond applied.  All counts found to be correct.  Breast binder placed.  Patient was then awoke extubated taken recovery in satisfactory condition.  All final counts were correct.

## 2022-06-20 NOTE — Anesthesia Preprocedure Evaluation (Signed)
Anesthesia Evaluation  Patient identified by MRN, date of birth, ID band Patient awake    Reviewed: Allergy & Precautions, NPO status , Patient's Chart, lab work & pertinent test results  History of Anesthesia Complications Negative for: history of anesthetic complications  Airway Mallampati: II  TM Distance: >3 FB Neck ROM: Full    Dental  (+) Teeth Intact, Dental Advisory Given   Pulmonary sleep apnea and Continuous Positive Airway Pressure Ventilation , Current Smoker and Patient abstained from smoking.,    Pulmonary exam normal        Cardiovascular hypertension, Normal cardiovascular exam+ dysrhythmias      Neuro/Psych  Headaches, Anxiety Depression Bipolar Disorder    GI/Hepatic Neg liver ROS, GERD  ,  Endo/Other  negative endocrine ROS  Renal/GU negative Renal ROS  negative genitourinary   Musculoskeletal  (+) Arthritis , Fibromyalgia -  Abdominal   Peds  Hematology negative hematology ROS (+)   Anesthesia Other Findings   Reproductive/Obstetrics                             Anesthesia Physical Anesthesia Plan  ASA: 2  Anesthesia Plan: General   Post-op Pain Management: Toradol IV (intra-op)* and Ofirmev IV (intra-op)*   Induction: Intravenous  PONV Risk Score and Plan: 2 and Ondansetron, Midazolam and Treatment may vary due to age or medical condition  Airway Management Planned: LMA  Additional Equipment: None  Intra-op Plan:   Post-operative Plan: Extubation in OR  Informed Consent: I have reviewed the patients History and Physical, chart, labs and discussed the procedure including the risks, benefits and alternatives for the proposed anesthesia with the patient or authorized representative who has indicated his/her understanding and acceptance.     Dental advisory given  Plan Discussed with:   Anesthesia Plan Comments:         Anesthesia Quick  Evaluation

## 2022-06-20 NOTE — Anesthesia Postprocedure Evaluation (Signed)
Anesthesia Post Note  Patient: [name] [name]  Procedure(s) Performed: LEFT BREAST LUMPECTOMY WITH RADIOACTIVE SEED LOCALIZATION (Left: Breast)     Patient location during evaluation: PACU Anesthesia Type: General Level of consciousness: awake and alert Pain management: pain level controlled Vital Signs Assessment: post-procedure vital signs reviewed and stable Respiratory status: spontaneous breathing, nonlabored ventilation and respiratory function stable Cardiovascular status: blood pressure returned to baseline and stable Postop Assessment: no apparent nausea or vomiting Anesthetic complications: no   No notable events documented.  Last Vitals:  Vitals:   06/20/22 1130 06/20/22 1200  BP: 127/77 128/85  Pulse: 60 80  Resp: (!) 8 16  Temp:  36.7 C  SpO2: 95% 98%    Last Pain:  Vitals:   06/20/22 1200  TempSrc:   PainSc: 1                  Lidia Collum

## 2022-06-20 NOTE — Discharge Instructions (Addendum)
Post Anesthesia Home Care Instructions  Activity: Get plenty of rest for the remainder of the day. A responsible individual must stay with you for 24 hours following the procedure.  For the next 24 hours, DO NOT: -Drive a car -Paediatric nurse -Drink alcoholic beverages -Take any medication unless instructed by your physician -Make any legal decisions or sign important papers.  Meals: Start with liquid foods such as gelatin or soup. Progress to regular foods as tolerated. Avoid greasy, spicy, heavy foods. If nausea and/or vomiting occur, drink only clear liquids until the nausea and/or vomiting subsides. Call your physician if vomiting continues.  Special Instructions/Symptoms: Your throat may feel dry or sore from the anesthesia or the breathing tube placed in your throat during surgery. If this causes discomfort, gargle with warm salt water. The discomfort should disappear within 24 hours.  If you had a scopolamine patch placed behind your ear for the management of post- operative nausea and/or vomiting:  1. The medication in the patch is effective for 72 hours, after which it should be removed.  Wrap patch in a tissue and discard in the trash. Wash hands thoroughly with soap and water. 2. You may remove the patch earlier than 72 hours if you experience unpleasant side effects which may include dry mouth, dizziness or visual disturbances. 3. Avoid touching the patch. Wash your hands with soap and water after contact with the patch.  Next tylenol dose 5pm    [employer] Phone Number [phone]  BREAST BIOPSY/ PARTIAL MASTECTOMY: POST OP INSTRUCTIONS  Always review your discharge instruction sheet given to you by the facility where your surgery was performed.  IF YOU HAVE DISABILITY OR FAMILY LEAVE FORMS, YOU MUST BRING THEM TO THE OFFICE FOR PROCESSING.  DO NOT GIVE THEM TO YOUR DOCTOR.  A prescription for pain medication may be given to you upon  discharge.  Take your pain medication as prescribed, if needed.  If narcotic pain medicine is not needed, then you may take acetaminophen (Tylenol) or ibuprofen (Advil) as needed. Take your usually prescribed medications unless otherwise directed If you need a refill on your pain medication, please contact your pharmacy.  They will contact our office to request authorization.  Prescriptions will not be filled after 5pm or on week-ends. You should eat very light the first 24 hours after surgery, such as soup, crackers, pudding, etc.  Resume your normal diet the day after surgery. Most patients will experience some swelling and bruising in the breast.  Ice packs and a good support bra will help.  Swelling and bruising can take several days to resolve.  It is common to experience some constipation if taking pain medication after surgery.  Increasing fluid intake and taking a stool softener will usually help or prevent this problem from occurring.  A mild laxative (Milk of Magnesia or Miralax) should be taken according to package directions if there are no bowel movements after 48 hours. Unless discharge instructions indicate otherwise, you may remove your bandages 24-48 hours after surgery, and you may shower at that time.  You may have steri-strips (small skin tapes) in place directly over the incision.  These strips should be left on the skin for 7-10 days.  If your surgeon used skin glue on the incision, you may shower in 24 hours.  The glue will flake off over the next 2-3 weeks.  Any sutures or staples will be removed at the office during your follow-up visit. ACTIVITIES:  You may resume  regular daily activities (gradually increasing) beginning the next day.  Wearing a good support bra or sports bra minimizes pain and swelling.  You may have sexual intercourse when it is comfortable. You may drive when you no longer are taking prescription pain medication, you can comfortably wear a seatbelt, and you can  safely maneuver your car and apply brakes. RETURN TO WORK:  ______________________________________________________________________________________ Dennis Bast should see your doctor in the office for a follow-up appointment approximately two weeks after your surgery.  Your doctor's nurse will typically make your follow-up appointment when she calls you with your pathology report.  Expect your pathology report 2-3 business days after your surgery.  You may call to check if you do not hear from [place] after three days. OTHER INSTRUCTIONS: _______________________________________________________________________________________________ _____________________________________________________________________________________________________________________________________ _____________________________________________________________________________________________________________________________________ _____________________________________________________________________________________________________________________________________  WHEN TO CALL YOUR DOCTOR: Fever over 101.0 Nausea and/or vomiting. Extreme swelling or bruising. Continued bleeding from incision. Increased pain, redness, or drainage from the incision.  The clinic staff is available to answer your questions during regular business hours.  Please don't hesitate to call and ask to speak to one of the nurses for clinical concerns.  If you have a medical emergency, go to the nearest emergency room or call 911.  A surgeon from [facility] is always on call at the hospital.  For further questions, please visit [email or web address]

## 2022-06-20 NOTE — Transfer of Care (Signed)
Immediate Anesthesia Transfer of Care Note  Patient: [name] [name]  Procedure(s) Performed: LEFT BREAST LUMPECTOMY WITH RADIOACTIVE SEED LOCALIZATION (Left: Breast)  Patient Location: PACU  Anesthesia Type:General  Level of Consciousness: drowsy  Airway & Oxygen Therapy: Patient Spontanous Breathing and Patient connected to face mask oxygen  Post-op Assessment: Report given to RN and Post -op Vital signs reviewed and stable  Post vital signs: Reviewed and stable  Last Vitals:  Vitals Value Taken Time  BP 133/89 06/20/22 1052  Temp    Pulse 69 06/20/22 1055  Resp 14 06/20/22 1055  SpO2 100 % 06/20/22 1055  Vitals shown include unvalidated device data.  Last Pain:  Vitals:   06/20/22 0858  TempSrc: Oral  PainSc: 0-No pain      Patients Stated Pain Goal: 4 (18/84/16 6063)  Complications: No notable events documented.

## 2022-06-21 ENCOUNTER — Encounter (HOSPITAL_BASED_OUTPATIENT_CLINIC_OR_DEPARTMENT_OTHER): Payer: Self-pay | Admitting: Surgery

## 2022-06-21 ENCOUNTER — Encounter (HOSPITAL_COMMUNITY): Payer: Self-pay

## 2022-06-22 LAB — SURGICAL PATHOLOGY

## 2022-06-26 ENCOUNTER — Encounter: Payer: Self-pay | Admitting: Surgery

## 2022-06-29 ENCOUNTER — Telehealth: Payer: Self-pay | Admitting: Family Medicine

## 2022-06-29 NOTE — Telephone Encounter (Signed)
Left message for patient to call back and schedule Medicare Annual Wellness Visit (AWV) either virtually or in office. Left  my Herbie Drape number [phone]   Last AWV ;06/01/21\  please schedule at anytime with [facility] Nurse Health Advisor 1 or 2

## 2022-07-11 DIAGNOSIS — F315 Bipolar disorder, current episode depressed, severe, with psychotic features: Secondary | ICD-10-CM | POA: Diagnosis not present

## 2022-07-17 NOTE — Progress Notes (Unsigned)
HPI: [name] [name] is a 51 y.o. female, who is here today for her routine physical.  Last CPE: Has not had one in years. She was last seen on 06/14/22. Marland Kitchen Since her last visit she underwent left breast lumpectomy, 06/20/22.  She is not exercising regularly.  Has lost some wt, decreased appetite due to depression. She is following with psychiatrist q 6 weeks and psychotherapist q 2 weeks. In general she eats home made meals, vegetables and fruit.  Sleeping 80% of the day, attributed to depression.  Chronic medical problems: Bipolar disorder,depression,anxiety,fibromyalgia, HTN, sinus tachycardia,constipation, OSA on CPAP, GERD, vit D def, and HLD among some.  Immunization History  Administered Date(s) Administered   Influenza Whole 09/01/2009, 08/07/2012   Influenza,inj,Quad PF,6+ Mos 09/13/2018   Tdap 11/07/2012   Health Maintenance  Topic Date Due   COVID-19 Vaccine (1) Never done   Hepatitis C Screening  Never done   INFLUENZA VACCINE  06/20/2022   TETANUS/TDAP  11/07/2022   COLONOSCOPY (Pts 45-41yr Insurance coverage will need to be confirmed)  05/19/2029   HIV Screening  Completed   HPV VACCINES  Aged Out   PAP SMEAR-Modifier  Discontinued  S/P hysterectomy. She has an appt with gyn in a few days.  HLD: She is on non pharmacologic treatment. Lab Results  Component Value Date   CHOL 177 06/22/2020   HDL 58.00 06/22/2020   LDLCALC 99 06/22/2020   TRIG 100.0 06/22/2020   CHOLHDL 3 06/22/2020   HTN: She is on Propranolol 40 mg bid. She is not checking BP regularly. Lab Results  Component Value Date   CREATININE 0.69 06/13/2021   BUN 10 06/13/2021   NA 139 06/13/2021   K 4.4 06/13/2021   CL 104 06/13/2021   CO2 27 06/13/2021   Glucose has been elevated in the past, no hx of diabetes. Lab Results  Component Value Date   HGBA1C 5.4 06/22/2020   Fibromyalgia on Lyrica 300 mg bid.  Requesting STD screening. Just found out her ex boyfriend "was sleeping  with prostitutes." She has had some vaginal discharge for a few days.  She also would like UA done, odorous urine and urgency, no dysuria. Treated for UTI in 05/2022 but symptoms have not resolved. 05/2022: ISOLATE 1: Escherichia coli Abnormal    Comment: 50,000-100,000 CFU/mL of Escherichia coli  ISOLATE 2: Streptococcus agalactiae Abnormal     Review of Systems  Constitutional:  Positive for appetite change and fatigue. Negative for activity change and fever.  HENT:  Positive for trouble swallowing. Negative for hearing loss, mouth sores and sore throat.   Eyes:  Negative for redness and visual disturbance.  Respiratory:  Negative for cough, shortness of breath and wheezing.   Cardiovascular:  Negative for chest pain and leg swelling.  Gastrointestinal:  Positive for diarrhea (With anxiety.). Negative for abdominal pain, nausea and vomiting.       No changes in bowel habits.  Endocrine: Negative for cold intolerance, heat intolerance, polydipsia, polyphagia and polyuria.  Genitourinary:  Positive for vaginal discharge. Negative for decreased urine volume, dysuria, hematuria, pelvic pain and vaginal bleeding.  Musculoskeletal:  Positive for arthralgias and myalgias. Negative for gait problem.  Skin:  Negative for color change and rash.  Allergic/Immunologic: Positive for environmental allergies.  Neurological:  Negative for syncope, weakness and headaches.  Hematological:  Negative for adenopathy. Does not bruise/bleed easily.  Psychiatric/Behavioral:  Negative for confusion. The patient is nervous/anxious.   All other systems reviewed and are negative.  Current Outpatient Medications on File Prior to Visit  Medication Sig Dispense Refill   acetaminophen (TYLENOL) 500 MG tablet Take 1,000 mg by mouth every 6 (six) hours as needed for moderate pain.     EPINEPHrine 0.3 mg/0.3 mL IJ SOAJ injection Inject 0.3 mg into the muscle as needed for anaphylaxis. 2 each 0   escitalopram (LEXAPRO)  20 MG tablet Take 20 mg by mouth at bedtime.     ferrous sulfate 325 (65 FE) MG tablet Take 325 mg by mouth at bedtime.     lamoTRIgine (LAMICTAL) 100 MG tablet TAKE 1 TABLET TWICE DAILY (Patient taking differently: Take 100 mg by mouth at bedtime.) 20 tablet 0   LINZESS 145 MCG CAPS capsule TAKE 1 CAPSULE EVERY DAY AS NEEDED 90 capsule 0   lurasidone (LATUDA) 20 MG TABS tablet TAKE 1 TABLET AT BEDTIME 10 tablet 0   Multiple Vitamins-Minerals (MULTIVITAMIN WITH MINERALS) tablet Take 1 tablet by mouth at bedtime.     omeprazole (PRILOSEC) 40 MG capsule TAKE 1 CAPSULE EVERY DAY 90 capsule 1   pregabalin (LYRICA) 300 MG capsule Take 1 capsule (300 mg total) by mouth 2 (two) times daily. 60 capsule 3   propranolol (INDERAL) 40 MG tablet Take 1 tablet (40 mg total) by mouth 2 (two) times daily. (Patient taking differently: Take 40 mg by mouth daily.) 180 tablet 2   No current facility-administered medications on file prior to visit.   Past Medical History:  Diagnosis Date   Anxiety    Bipolar disorder ([place])    Chronic kidney disease    kidney infections   Depression    Fibromyalgia    GERD (gastroesophageal reflux disease)    History of cervical dysplasia    History of exercise intolerance    normal ETT 10-24-2016   History of kidney stones    Hyperlipidemia    Hypertension    Migraines    Osteoarthritis    PCOS (polycystic ovarian syndrome)    Pneumonia 2018   Rash    in area of eswl 04-23-2017   Right ureteral calculus    Sleep apnea    Tachycardia cardiologist-  dr harding   controlled w/ metoprolol    Past Surgical History:  Procedure Laterality Date   ANTERIOR CERVICAL DECOMP/DISCECTOMY FUSION N/A 09/01/2015   Procedure: ANTERIOR CERVICAL DECOMPRESSION/DISCECTOMY FUSION PLATING BONEGRAFT CERVICAL FIVE-SIX;  Surgeon: Kevan Ny Ditty, MD;  Location: [facility];  Service: Neurosurgery;  Laterality: N/A;   BIOPSY  05/20/2019   Procedure: BIOPSY;  Surgeon: Juanita Craver,  MD;  Location: [facility];  Service: Endoscopy;;   BREAST BIOPSY Left 02/14/2022   U/S   BREAST BIOPSY Right 03/24/2022   MRI   BREAST EXCISIONAL BIOPSY Bilateral left 10/15/2002;   right 1997   left ductal system excision (papilloma w/ florid epithelial hyperplasia)/  right benign lumpectomy   BREAST LUMPECTOMY WITH RADIOACTIVE SEED LOCALIZATION Left 06/20/2022   Procedure: LEFT BREAST LUMPECTOMY WITH RADIOACTIVE SEED LOCALIZATION;  Surgeon: Erroll Luna, MD;  Location: [place];  Service: General;  Laterality: Left;   CARDIOVASCULAR STRESS TEST  04/27/2009   normal nuclear study w/ no ischemia/  normal LV function and wall motion , ef 66%   CHOLECYSTECTOMY N/A 03/13/2013   Procedure: LAPAROSCOPIC CHOLECYSTECTOMY;  Surgeon: Harl Bowie, MD;  Location: [place];  Service: General;  Laterality: N/A;   COLONOSCOPY  last one 11-08-2006   COLONOSCOPY N/A 05/20/2019   Procedure: COLONOSCOPY;  Surgeon: Juanita Craver,  MD;  Location: [facility];  Service: Endoscopy;  Laterality: N/A;   CYSTOSCOPY N/A 05/08/2018   Procedure: CYSTOSCOPY;  Surgeon: Paula Compton, MD;  Location: [facility];  Service: Gynecology;  Laterality: N/A;   CYSTOSCOPY WITH RETROGRADE PYELOGRAM, URETEROSCOPY AND STENT PLACEMENT Right 05/18/2017   Procedure: CYSTOSCOPY WITH RETROGRADE PYELOGRAM, URETEROSCOPY AND STENT PLACEMENT;  Surgeon: Alexis Frock, MD;  Location: [facility];  Service: Urology;  Laterality: Right;   DX LAPAROSCOPY W/ LYSIS ADHESIONS/  LASER VAPORIZATION OF CERVIX  06/25/2002   ESOPHAGOGASTRODUODENOSCOPY  04/25/2005   ESOPHAGOGASTRODUODENOSCOPY (EGD) WITH PROPOFOL N/A 05/20/2019   Procedure: ESOPHAGOGASTRODUODENOSCOPY (EGD) WITH PROPOFOL;  Surgeon: Juanita Craver, MD;  Location: [facility];  Service: Endoscopy;  Laterality: N/A;   EXPLORATORY LEFT THUMB REPAIR TENDON/ LACERATION  09/27/1999   EXTRACORPOREAL SHOCK WAVE LITHOTRIPSY Right 04/23/2017    Procedure: RIGHT EXTRACORPOREAL SHOCK WAVE LITHOTRIPSY (ESWL);  Surgeon: Alexis Frock, MD;  Location: [facility];  Service: Urology;  Laterality: Right;   EXTRACORPOREAL SHOCK WAVE LITHOTRIPSY Right 04/23/2017   HOLMIUM LASER APPLICATION Right 85/46/2703   Procedure: HOLMIUM LASER APPLICATION;  Surgeon: Alexis Frock, MD;  Location: [facility];  Service: Urology;  Laterality: Right;   LAPAROSCOPIC VAGINAL HYSTERECTOMY WITH SALPINGECTOMY Bilateral 05/08/2018   Procedure: LAPAROSCOPIC ASSISTED VAGINAL HYSTERECTOMY WITH SALPINGECTOMY,  I & D Sebaceous Left Labia;  Surgeon: Paula Compton, MD;  Location: [facility];  Service: Gynecology;  Laterality: Bilateral;   LAPAROSCOPY  07/30/2012   Procedure: LAPAROSCOPY DIAGNOSTIC;  Surgeon: Logan Bores, MD;  Location: [facility];  Service: Gynecology;  Laterality: N/A;   POLYPECTOMY  05/20/2019   Procedure: POLYPECTOMY;  Surgeon: Juanita Craver, MD;  Location: [facility];  Service: Endoscopy;;   WISDOM TOOTH EXTRACTION     Allergies  Allergen Reactions   Aspirin Shortness Of Breath and Other (See Comments)    Angiodema   Bee Venom Hives    Face swelling and chest pain   Diclofenac Anaphylaxis   Naproxen Swelling    Facial swelling   Nsaids Anaphylaxis    Swelling of eyes mouth and throat difficulty breathing   Prednisone     Worsened depression, suicidal ideation    Terbinafine And Related     Worsened depression, suicidal ideation   Family History  Problem Relation Age of Onset   Diabetes Father    Hypertension Father    Arthritis Father    Depression Father    Hearing loss Father    Diabetes Mother    Hypertension Mother    Arthritis Mother    Depression Mother    Asthma Paternal Grandmother    Kidney disease Paternal Grandmother    Arthritis Paternal Grandmother    Cancer Paternal Grandmother    COPD Paternal Grandmother    Hearing loss Paternal Grandmother    Heart disease Paternal Grandmother    Hypertension  Paternal Grandmother    Hyperlipidemia Paternal Grandmother    Stroke Paternal Grandmother    Heart attack Paternal Grandmother    Depression Sister    Emphysema Other    Tongue cancer Other    Coronary artery disease Other    COPD Son    Drug abuse Son    Arthritis Maternal Grandmother    COPD Maternal Grandmother    Depression Maternal Grandmother    Heart disease Maternal Grandmother    Early death Maternal Grandfather    Heart attack Maternal Grandfather    COPD Paternal Grandfather    Heart attack Paternal Grandfather  Breast cancer Neg Hx    Social History   Socioeconomic History   Marital status: Legally Separated    Spouse name: Not on file   Number of children: 2   Years of education: Not on file   Highest education level: Not on file  Occupational History   Occupation: Unemployed  Tobacco Use   Smoking status: Some Days    Packs/day: 0.50    Years: 25.00    Total pack years: 12.50    Types: Cigarettes   Smokeless tobacco: Never   Tobacco comments:    6-7 cig. daily  Vaping Use   Vaping Use: Never used  Substance and Sexual Activity   Alcohol use: No   Drug use: No    Types: Cocaine    Comment: last cocaine use 2014   Sexual activity: Not Currently    Partners: Male    Birth control/protection: None  Other Topics Concern   Not on file  Social History Narrative   She lives at home with her spouse. She does not exercise.   She currently denies recent recreational drug use.   Social Determinants of Health   Financial Resource Strain: Low [name]  (06/01/2021)   Overall Financial Resource Strain (CARDIA)    Difficulty of Paying Living Expenses: Not hard at all  Food Insecurity: No Food Insecurity (06/01/2021)   Hunger Vital Sign    Worried About Running Out of Food in the Last Year: Never true    Ran Out of Food in the Last Year: Never true  Transportation Needs: No Transportation Needs (06/01/2021)   PRAPARE - Radiographer, therapeutic (Medical): No    Lack of Transportation (Non-Medical): No  Physical Activity: Inactive (06/01/2021)   Exercise Vital Sign    Days of Exercise per Week: 0 days    Minutes of Exercise per Session: 0 min  Stress: Stress Concern Present (06/01/2021)   [place]    Feeling of Stress : Rather much  Social Connections: Socially Isolated (06/01/2021)   Social Connection and Isolation Panel [NHANES]    Frequency of Communication with Friends and Family: Once a week    Frequency of Social Gatherings with Friends and Family: Never    Attends Religious Services: Never    Marine scientist or Organizations: No    Attends Archivist Meetings: Never    Marital Status: Separated   Vitals:   07/18/22 0900  BP: 138/80  Pulse: 78  Resp: 12  Temp: 98.5 F (36.9 C)  SpO2: 97%  Body mass index is 26.31 kg/m. Wt Readings from Last 3 Encounters:  07/18/22 168 lb (76.2 kg)  06/20/22 173 lb 15.1 oz (78.9 kg)  06/14/22 176 lb 8 oz (80.1 kg)  Physical Exam Vitals and nursing note reviewed.  Constitutional:      General: She is not in acute distress.    Appearance: She is well-developed.  HENT:     Head: Normocephalic and atraumatic.     Right Ear: Hearing, tympanic membrane, ear canal and external ear normal.     Left Ear: Hearing, tympanic membrane, ear canal and external ear normal.     Mouth/Throat:     Mouth: Mucous membranes are moist.     Pharynx: Oropharynx is clear. Uvula midline.  Eyes:     Extraocular Movements: Extraocular movements intact.     Conjunctiva/sclera: Conjunctivae normal.     Pupils: Pupils are equal,  round, and reactive to light.  Neck:     Thyroid: No thyromegaly.     Trachea: No tracheal deviation.  Cardiovascular:     Rate and Rhythm: Normal rate and regular rhythm.     Pulses:          Dorsalis pedis pulses are 2+ on the right side and 2+ on the left side.     Heart  sounds: No murmur heard. Pulmonary:     Effort: Pulmonary effort is normal. No respiratory distress.     Breath sounds: Normal breath sounds.  Abdominal:     Palpations: Abdomen is soft. There is no hepatomegaly or mass.     Tenderness: There is no abdominal tenderness.  Genitourinary:    Comments: Deferred to gyn. Musculoskeletal:     Comments: No major deformity or signs of synovitis appreciated.  Lymphadenopathy:     Cervical: No cervical adenopathy.     Upper Body:     Right upper body: No supraclavicular adenopathy.     Left upper body: No supraclavicular adenopathy.  Skin:    General: Skin is warm.     Findings: No erythema or rash.  Neurological:     General: No focal deficit present.     Mental Status: She is alert and oriented to person, place, and time.     Cranial Nerves: No cranial nerve deficit.     Coordination: Coordination normal.     Gait: Gait normal.     Deep Tendon Reflexes:     Reflex Scores:      Bicep reflexes are 2+ on the right side and 2+ on the left side.      Patellar reflexes are 2+ on the right side and 2+ on the left side. Psychiatric:        Mood and Affect: Mood is anxious.   ASSESSMENT AND PLAN:  [name] [name] was here today annual physical examination.  Orders Placed This Encounter  Procedures   Comprehensive metabolic panel   Lipid panel   Hemoglobin A1c   Hepatitis C antibody screen   HIV antibody   RPR   Urinalysis with Culture Reflex   TSH   Lab Results  Component Value Date   TSH 2.42 07/18/2022   Lab Results  Component Value Date   CHOL 166 07/18/2022   HDL 53.60 07/18/2022   LDLCALC 88 07/18/2022   TRIG 121.0 07/18/2022   CHOLHDL 3 07/18/2022   Lab Results  Component Value Date   CREATININE 0.83 07/18/2022   BUN 11 07/18/2022   NA 139 07/18/2022   K 3.9 07/18/2022   CL 105 07/18/2022   CO2 22 07/18/2022   Lab Results  Component Value Date   HGBA1C 5.7 07/18/2022   Lab Results  Component Value  Date   ALT 10 07/18/2022   AST 10 07/18/2022   ALKPHOS 67 07/18/2022   BILITOT 0.4 07/18/2022   Routine general medical examination at a health care facility We discussed the importance of regular physical activity and healthy diet for prevention of chronic illness and/or complications. Preventive guidelines reviewed. Vaccination up to date. She has appt with gyn in a few days. Next CPE in a year. The 10-year ASCVD [name] score (Arnett DK, et al., 2019) is: 3.9%   Values used to calculate the score:     Age: 28 years     Sex: Female     Is Non-Hispanic African American: No     Diabetic: No  Tobacco smoker: Yes     Systolic Blood Pressure: 102 mmHg     Is BP treated: Yes     HDL Cholesterol: 53.6 mg/dL     Total Cholesterol: 166 mg/dL  Encounter for HCV screening test for low [name] patient -     Hepatitis C antibody screen  Screen for STD (sexually transmitted disease) -     Urine cytology ancillary only -     RPR -     HIV antibody  Bad odor of urine Increase fluid intake. Further recommendations according to UA/Ucx.  Hyperglycemia -     Hemoglobin A1c  Essential hypertension BP adequately controlled. Continue Propranolol 40 mg bid and low salt diet.  Hyperlipidemia, unspecified Non pharmacologic treatment recommended for now. Further recommendations will be given according to 10 years CVD [name] score and lipid panel numbers.  Return in 5 months (on 12/18/2022) for F/U.  Penda Venturi G. Martin[place], MD  [facility]. [place] office.

## 2022-07-18 ENCOUNTER — Other Ambulatory Visit (HOSPITAL_COMMUNITY)
Admission: RE | Admit: 2022-07-18 | Discharge: 2022-07-18 | Disposition: A | Discharge status: Home / Self Care | Payer: Medicare HMO | Source: Ambulatory Visit | Attending: Family Medicine | Admitting: Family Medicine

## 2022-07-18 ENCOUNTER — Ambulatory Visit (INDEPENDENT_AMBULATORY_CARE_PROVIDER_SITE_OTHER): Payer: Medicare HMO | Admitting: Family Medicine

## 2022-07-18 ENCOUNTER — Encounter: Payer: Self-pay | Admitting: Family Medicine

## 2022-07-18 VITALS — BP 138/80 | HR 78 | Temp 98.5°F | Resp 12 | Ht 67.0 in | Wt 168.0 lb

## 2022-07-18 DIAGNOSIS — I10 Essential (primary) hypertension: Secondary | ICD-10-CM | POA: Diagnosis not present

## 2022-07-18 DIAGNOSIS — E785 Hyperlipidemia, unspecified: Secondary | ICD-10-CM

## 2022-07-18 DIAGNOSIS — R739 Hyperglycemia, unspecified: Secondary | ICD-10-CM | POA: Diagnosis not present

## 2022-07-18 DIAGNOSIS — Z0000 Encounter for general adult medical examination without abnormal findings: Secondary | ICD-10-CM

## 2022-07-18 DIAGNOSIS — Z1159 Encounter for screening for other viral diseases: Secondary | ICD-10-CM | POA: Diagnosis not present

## 2022-07-18 DIAGNOSIS — R8290 Unspecified abnormal findings in urine: Secondary | ICD-10-CM

## 2022-07-18 DIAGNOSIS — Z113 Encounter for screening for infections with a predominantly sexual mode of transmission: Secondary | ICD-10-CM

## 2022-07-18 DIAGNOSIS — M797 Fibromyalgia: Secondary | ICD-10-CM

## 2022-07-18 DIAGNOSIS — F315 Bipolar disorder, current episode depressed, severe, with psychotic features: Secondary | ICD-10-CM | POA: Diagnosis not present

## 2022-07-18 LAB — HEMOGLOBIN A1C: Hgb A1c MFr Bld: 5.7 % (ref 4.6–6.5)

## 2022-07-18 LAB — LIPID PANEL
Cholesterol: 166 mg/dL (ref 0–200)
HDL: 53.6 mg/dL (ref >39.00)
LDL Cholesterol: 88 mg/dL (ref 0–99)
NonHDL: 112.3
Total CHOL/HDL Ratio: 3
Triglycerides: 121 mg/dL (ref 0.0–149.0)
VLDL: 24.2 mg/dL (ref 0.0–40.0)

## 2022-07-18 LAB — COMPREHENSIVE METABOLIC PANEL
ALT: 10 U/L (ref 0–35)
AST: 10 U/L (ref 0–37)
Albumin: 4.1 g/dL (ref 3.5–5.2)
Alkaline Phosphatase: 67 U/L (ref 39–117)
BUN: 11 mg/dL (ref 6–23)
CO2: 22 mEq/L (ref 19–32)
Calcium: 9.2 mg/dL (ref 8.4–10.5)
Chloride: 105 mEq/L (ref 96–112)
Creatinine, Ser: 0.83 mg/dL (ref 0.40–1.20)
GFR: 83.13 mL/min (ref >60.00)
Glucose, Bld: 92 mg/dL (ref 70–99)
Potassium: 3.9 mEq/L (ref 3.5–5.1)
Sodium: 139 mEq/L (ref 135–145)
Total Bilirubin: 0.4 mg/dL (ref 0.2–1.2)
Total Protein: 7.1 g/dL (ref 6.0–8.3)

## 2022-07-18 LAB — TSH: TSH: 2.42 u[IU]/mL (ref 0.35–5.50)

## 2022-07-18 NOTE — Assessment & Plan Note (Signed)
BP adequately controlled. Continue Propranolol 40 mg bid and low salt diet.

## 2022-07-18 NOTE — Assessment & Plan Note (Signed)
Non pharmacologic treatment recommended for now. Further recommendations will be given according to 10 years CVD risk score and lipid panel numbers. 

## 2022-07-18 NOTE — Patient Instructions (Addendum)
A few things to remember from today's visit:  Routine general medical examination at a health care facility  Essential hypertension - Plan: Comprehensive metabolic panel, TSH  Fibromyalgia  Encounter for HCV screening test for low risk patient - Plan: Hepatitis C antibody screen  Hyperlipidemia, unspecified hyperlipidemia type - Plan: Lipid panel  Screen for STD (sexually transmitted disease) - Plan: HIV antibody, RPR, Urine cytology ancillary only  Bad odor of urine - Plan: Urinalysis with Culture Reflex  Hyperglycemia - Plan: Hemoglobin A1c  If you need refills please call your pharmacy. Do not use My Chart to request refills or for acute issues that need immediate attention.    Please be sure medication list is accurate. If a new problem present, please set up appointment sooner than planned today.        Health Maintenance, Female Adopting a healthy lifestyle and getting preventive care are important in promoting health and wellness. Ask your health care provider about: The right schedule for you to have regular tests and exams. Things you can do on your own to prevent diseases and keep yourself healthy. What should I know about diet, weight, and exercise? Eat a healthy diet  Eat a diet that includes plenty of vegetables, fruits, low-fat dairy products, and lean protein. Do not eat a lot of foods that are high in solid fats, added sugars, or sodium. Maintain a healthy weight Body mass index (BMI) is used to identify weight problems. It estimates body fat based on height and weight. Your health care provider can help determine your BMI and help you achieve or maintain a healthy weight. Get regular exercise Get regular exercise. This is one of the most important things you can do for your health. Most adults should: Exercise for at least 150 minutes each week. The exercise should increase your heart rate and make you sweat (moderate-intensity exercise). Do strengthening  exercises at least twice a week. This is in addition to the moderate-intensity exercise. Spend less time sitting. Even light physical activity can be beneficial. Watch cholesterol and blood lipids Have your blood tested for lipids and cholesterol at 49 years of age, then have this test every 5 years. Have your cholesterol levels checked more often if: Your lipid or cholesterol levels are high. You are older than 49 years of age. You are at high risk for heart disease. What should I know about cancer screening? Depending on your health history and family history, you may need to have cancer screening at various ages. This may include screening for: Breast cancer. Cervical cancer. Colorectal cancer. Skin cancer. Lung cancer. What should I know about heart disease, diabetes, and high blood pressure? Blood pressure and heart disease High blood pressure causes heart disease and increases the risk of stroke. This is more likely to develop in people who have high blood pressure readings or are overweight. Have your blood pressure checked: Every 3-5 years if you are 79-72 years of age. Every year if you are 64 years old or older. Diabetes Have regular diabetes screenings. This checks your fasting blood sugar level. Have the screening done: Once every three years after age 35 if you are at a normal weight and have a low risk for diabetes. More often and at a younger age if you are overweight or have a high risk for diabetes. What should I know about preventing infection? Hepatitis B If you have a higher risk for hepatitis B, you should be screened for this virus. Talk with your  health care provider to find out if you are at risk for hepatitis B infection. Hepatitis C Testing is recommended for: Everyone born from [birth year] through 1963. Anyone with known risk factors for hepatitis C. Sexually transmitted infections (STIs) Get screened for STIs, including gonorrhea and chlamydia, if: You are  sexually active and are younger than 49 years of age. You are older than 49 years of age and your health care provider tells you that you are at risk for this type of infection. Your sexual activity has changed since you were last screened, and you are at increased risk for chlamydia or gonorrhea. Ask your health care provider if you are at risk. Ask your health care provider about whether you are at high risk for HIV. Your health care provider may recommend a prescription medicine to help prevent HIV infection. If you choose to take medicine to prevent HIV, you should first get tested for HIV. You should then be tested every 3 months for as long as you are taking the medicine. Pregnancy If you are about to stop having your period (premenopausal) and you may become pregnant, seek counseling before you get pregnant. Take 400 to 800 micrograms (mcg) of folic acid every day if you become pregnant. Ask for birth control (contraception) if you want to prevent pregnancy. Osteoporosis and menopause Osteoporosis is a disease in which the bones lose minerals and strength with aging. This can result in bone fractures. If you are 3 years old or older, or if you are at risk for osteoporosis and fractures, ask your health care provider if you should: Be screened for bone loss. Take a calcium or vitamin D supplement to lower your risk of fractures. Be given hormone replacement therapy (HRT) to treat symptoms of menopause. Follow these instructions at home: Alcohol use Do not drink alcohol if: Your health care provider tells you not to drink. You are pregnant, may be pregnant, or are planning to become pregnant. If you drink alcohol: Limit how much you have to: 0-1 drink a day. Know how much alcohol is in your drink. In the [place] one drink equals one 12 oz bottle of beer (355 mL), one 5 oz glass of wine (148 mL), or one 1 oz glass of hard liquor (44 mL). Lifestyle Do not use any products that contain  nicotine or tobacco. These products include cigarettes, chewing tobacco, and vaping devices, such as e-cigarettes. If you need help quitting, ask your health care provider. Do not use street drugs. Do not share needles. Ask your health care provider for help if you need support or information about quitting drugs. General instructions Schedule regular health, dental, and eye exams. Stay current with your vaccines. Tell your health care provider if: You often feel depressed. You have ever been abused or do not feel safe at home. Summary Adopting a healthy lifestyle and getting preventive care are important in promoting health and wellness. Follow your health care provider's instructions about healthy diet, exercising, and getting tested or screened for diseases. Follow your health care provider's instructions on monitoring your cholesterol and blood pressure. This information is not intended to replace advice given to you by your health care provider. Make sure you discuss any questions you have with your health care provider. Document Revised: 03/28/2021 Document Reviewed: 03/28/2021 Elsevier Patient Education  [place].

## 2022-07-19 LAB — URINE CYTOLOGY ANCILLARY ONLY
Chlamydia: NEGATIVE
Comment: NEGATIVE
Comment: NEGATIVE
Comment: NORMAL
Neisseria Gonorrhea: NEGATIVE
Trichomonas: NEGATIVE

## 2022-07-21 LAB — HEPATITIS C ANTIBODY: Hepatitis C Ab: NONREACTIVE

## 2022-07-21 LAB — HIV ANTIBODY (ROUTINE TESTING W REFLEX): HIV 1&2 Ab, 4th Generation: NONREACTIVE

## 2022-07-21 LAB — SYPHILIS: RPR W/REFLEX TO RPR TITER AND TREPONEMAL ANTIBODIES, TRADITIONAL SCREENING AND DIAGNOSIS ALGORITHM: RPR Ser Ql: NONREACTIVE

## 2022-07-21 LAB — URINALYSIS W MICROSCOPIC + REFLEX CULTURE

## 2022-08-01 ENCOUNTER — Ambulatory Visit (INDEPENDENT_AMBULATORY_CARE_PROVIDER_SITE_OTHER): Payer: Medicare HMO

## 2022-08-01 VITALS — Ht 67.0 in | Wt 170.0 lb

## 2022-08-01 DIAGNOSIS — F603 Borderline personality disorder: Secondary | ICD-10-CM | POA: Diagnosis not present

## 2022-08-01 DIAGNOSIS — Z0000 Encounter for general adult medical examination without abnormal findings: Secondary | ICD-10-CM | POA: Diagnosis not present

## 2022-08-01 NOTE — Patient Instructions (Signed)
[name] [name] , Thank you for taking time to come for your Medicare Wellness Visit. I appreciate your ongoing commitment to your health goals. Please review the following plan we discussed and let me know if I can assist you in the future.   Screening recommendations/referrals: Colonoscopy: completed 05/20/2019, due 05/19/2029 Mammogram: completed 01/13/2022 Bone Density: n/a Recommended yearly ophthalmology/optometry visit for glaucoma screening and checkup Recommended yearly dental visit for hygiene and checkup  Vaccinations: Influenza vaccine: due Pneumococcal vaccine: n/a Tdap vaccine: completed 11/07/2012, due 11/07/2022 Shingles vaccine: n/a  Covid-19: had one dose  Advanced directives: Advance directive discussed with you today.   Conditions/risks identified: smoking  Next appointment: Follow up in one year for your annual wellness visit.   Preventive Care 40-64 Years, Female Preventive care refers to lifestyle choices and visits with your health care provider that can promote health and wellness. What does preventive care include? A yearly physical exam. This is also called an annual well check. Dental exams once or twice a year. Routine eye exams. Ask your health care provider how often you should have your eyes checked. Personal lifestyle choices, including: Daily care of your teeth and gums. Regular physical activity. Eating a healthy diet. Avoiding tobacco and drug use. Limiting alcohol use. Practicing safe sex. Taking low-dose aspirin daily starting at age 49. Taking vitamin and mineral supplements as recommended by your health care provider. What happens during an annual well check? The services and screenings done by your health care provider during your annual well check will depend on your age, overall health, lifestyle risk factors, and family history of disease. Counseling  Your health care provider may ask you questions about your: Alcohol use. Tobacco  use. Drug use. Emotional well-being. Home and relationship well-being. Sexual activity. Eating habits. Work and work Statistician. Method of birth control. Menstrual cycle. Pregnancy history. Screening  You may have the following tests or measurements: Height, weight, and BMI. Blood pressure. Lipid and cholesterol levels. These may be checked every 5 years, or more frequently if you are over 54 years old. Skin check. Lung cancer screening. You may have this screening every year starting at age 28 if you have a 30-pack-year history of smoking and currently smoke or have quit within the past 15 years. Fecal occult blood test (FOBT) of the stool. You may have this test every year starting at age 83. Flexible sigmoidoscopy or colonoscopy. You may have a sigmoidoscopy every 5 years or a colonoscopy every 10 years starting at age 65. Hepatitis C blood test. Hepatitis B blood test. Sexually transmitted disease (STD) testing. Diabetes screening. This is done by checking your blood sugar (glucose) after you have not eaten for a while (fasting). You may have this done every 1-3 years. Mammogram. This may be done every 1-2 years. Talk to your health care provider about when you should start having regular mammograms. This may depend on whether you have a family history of breast cancer. BRCA-related cancer screening. This may be done if you have a family history of breast, ovarian, tubal, or peritoneal cancers. Pelvic exam and Pap test. This may be done every 3 years starting at age 24. Starting at age 54, this may be done every 5 years if you have a Pap test in combination with an HPV test. Bone density scan. This is done to screen for osteoporosis. You may have this scan if you are at high risk for osteoporosis. Discuss your test results, treatment options, and if necessary, the need for  more tests with your health care provider. Vaccines  Your health care provider may recommend certain vaccines,  such as: Influenza vaccine. This is recommended every year. Tetanus, diphtheria, and acellular pertussis (Tdap, Td) vaccine. You may need a Td booster every 10 years. Zoster vaccine. You may need this after age 57. Pneumococcal 13-valent conjugate (PCV13) vaccine. You may need this if you have certain conditions and were not previously vaccinated. Pneumococcal polysaccharide (PPSV23) vaccine. You may need one or two doses if you smoke cigarettes or if you have certain conditions. Talk to your health care provider about which screenings and vaccines you need and how often you need them. This information is not intended to replace advice given to you by your health care provider. Make sure you discuss any questions you have with your health care provider. Document Released: 12/03/2015 Document Revised: 07/26/2016 Document Reviewed: 09/07/2015 Elsevier Interactive Patient Education  2017 [employer] Prevention in the Home Falls can cause injuries. They can happen to people of all ages. There are many things you can do to make your home safe and to help prevent falls. What can I do on the outside of my home? Regularly fix the edges of walkways and driveways and fix any cracks. Remove anything that might make you trip as you walk through a door, such as a raised step or threshold. Trim any bushes or trees on the path to your home. Use bright outdoor lighting. Clear any walking paths of anything that might make someone trip, such as rocks or tools. Regularly check to see if handrails are loose or broken. Make sure that both sides of any steps have handrails. Any raised decks and porches should have guardrails on the edges. Have any leaves, snow, or ice cleared regularly. Use sand or salt on walking paths during winter. Clean up any spills in your garage right away. This includes oil or grease spills. What can I do in the bathroom? Use night lights. Install grab bars by the toilet and  in the tub and shower. Do not use towel bars as grab bars. Use non-skid mats or decals in the tub or shower. If you need to sit down in the shower, use a plastic, non-slip stool. Keep the floor dry. Clean up any water that spills on the floor as soon as it happens. Remove soap buildup in the tub or shower regularly. Attach bath mats securely with double-sided non-slip rug tape. Do not have throw rugs and other things on the floor that can make you trip. What can I do in the bedroom? Use night lights. Make sure that you have a light by your bed that is easy to reach. Do not use any sheets or blankets that are too big for your bed. They should not hang down onto the floor. Have a firm chair that has side arms. You can use this for support while you get dressed. Do not have throw rugs and other things on the floor that can make you trip. What can I do in the kitchen? Clean up any spills right away. Avoid walking on wet floors. Keep items that you use a lot in easy-to-reach places. If you need to reach something above you, use a strong step stool that has a grab bar. Keep electrical cords out of the way. Do not use floor polish or wax that makes floors slippery. If you must use wax, use non-skid floor wax. Do not have throw rugs and other things  on the floor that can make you trip. What can I do with my stairs? Do not leave any items on the stairs. Make sure that there are handrails on both sides of the stairs and use them. Fix handrails that are broken or loose. Make sure that handrails are as long as the stairways. Check any carpeting to make sure that it is firmly attached to the stairs. Fix any carpet that is loose or worn. Avoid having throw rugs at the top or bottom of the stairs. If you do have throw rugs, attach them to the floor with carpet tape. Make sure that you have a light switch at the top of the stairs and the bottom of the stairs. If you do not have them, ask someone to add  them for you. What else can I do to help prevent falls? Wear shoes that: Do not have high heels. Have rubber bottoms. Are comfortable and fit you well. Are closed at the toe. Do not wear sandals. If you use a stepladder: Make sure that it is fully opened. Do not climb a closed stepladder. Make sure that both sides of the stepladder are locked into place. Ask someone to hold it for you, if possible. Clearly mark and make sure that you can see: Any grab bars or handrails. First and last steps. Where the edge of each step is. Use tools that help you move around (mobility aids) if they are needed. These include: Canes. Walkers. Scooters. Crutches. Turn on the lights when you go into a dark area. Replace any light bulbs as soon as they burn out. Set up your furniture so you have a clear path. Avoid moving your furniture around. If any of your floors are uneven, fix them. If there are any pets around you, be aware of where they are. Review your medicines with your doctor. Some medicines can make you feel dizzy. This can increase your chance of falling. Ask your doctor what other things that you can do to help prevent falls. This information is not intended to replace advice given to you by your health care provider. Make sure you discuss any questions you have with your health care provider. Document Released: 09/02/2009 Document Revised: 04/13/2016 Document Reviewed: 12/11/2014 Elsevier Interactive Patient Education  2017 [employer].

## 2022-08-01 NOTE — Progress Notes (Signed)
I connected with [name] [name] today by telephone and verified that I am speaking with the correct person using two identifiers. Location patient: home Location provider: work Persons participating in the virtual visit: [name] [name], [name] Durand LPN.   I discussed the limitations, risks, security and privacy concerns of performing an evaluation and management service by telephone and the availability of in person appointments. I also discussed with the patient that there may be a patient responsible charge related to this service. The patient expressed understanding and verbally consented to this telephonic visit.    Interactive audio and video telecommunications were attempted between this provider and patient, however failed, due to patient having technical difficulties OR patient did not have access to video capability.  We continued and completed visit with audio only.     Vital signs may be patient reported or missing.  Subjective:   [name] [name] is a 51 y.o. female who presents for Medicare Annual (Subsequent) preventive examination.  Review of Systems     Cardiac Risk Factors include: hypertension;smoking/ tobacco exposure     Objective:    Today's Vitals   08/01/22 1601 08/01/22 1602  Weight: 170 lb (77.1 kg)   Height: '5\' 7"'$  (1.702 m)   PainSc:  7    Body mass index is 26.63 kg/m.     08/01/2022    4:09 PM 06/20/2022    8:55 AM 06/13/2022    1:49 PM 06/01/2021    2:01 PM 05/18/2021    3:38 PM 05/20/2019    6:51 AM 05/08/2018    2:33 PM  Advanced Directives  Does Patient Have a Medical Advance Directive? No No No Yes No No No  Type of Advance Directive    [place] in Chart?    No - copy requested     Would patient like information on creating a medical advance directive?  No - Patient declined No - Patient declined   No - Patient declined No - Patient declined    Current Medications  (verified) Outpatient Encounter Medications as of 08/01/2022  Medication Sig   acetaminophen (TYLENOL) 500 MG tablet Take 1,000 mg by mouth every 6 (six) hours as needed for moderate pain.   EPINEPHrine 0.3 mg/0.3 mL IJ SOAJ injection Inject 0.3 mg into the muscle as needed for anaphylaxis.   escitalopram (LEXAPRO) 20 MG tablet Take 20 mg by mouth at bedtime.   ferrous sulfate 325 (65 FE) MG tablet Take 325 mg by mouth at bedtime.   lamoTRIgine (LAMICTAL) 100 MG tablet TAKE 1 TABLET TWICE DAILY (Patient taking differently: Take 100 mg by mouth at bedtime.)   LINZESS 145 MCG CAPS capsule TAKE 1 CAPSULE EVERY DAY AS NEEDED   lurasidone (LATUDA) 20 MG TABS tablet TAKE 1 TABLET AT BEDTIME   Multiple Vitamins-Minerals (MULTIVITAMIN WITH MINERALS) tablet Take 1 tablet by mouth at bedtime.   omeprazole (PRILOSEC) 40 MG capsule TAKE 1 CAPSULE EVERY DAY   pregabalin (LYRICA) 300 MG capsule Take 1 capsule (300 mg total) by mouth 2 (two) times daily.   propranolol (INDERAL) 40 MG tablet Take 1 tablet (40 mg total) by mouth 2 (two) times daily. (Patient taking differently: Take 40 mg by mouth daily.)   No facility-administered encounter medications on file as of 08/01/2022.    Allergies (verified) Aspirin, Bee venom, Diclofenac, Naproxen, Nsaids, Prednisone, and Terbinafine and related   History: Past Medical History:  Diagnosis Date  Anxiety    Bipolar disorder ([place])    Chronic kidney disease    kidney infections   Depression    Fibromyalgia    GERD (gastroesophageal reflux disease)    History of cervical dysplasia    History of exercise intolerance    normal ETT 10-24-2016   History of kidney stones    Hyperlipidemia    Hypertension    Migraines    Osteoarthritis    PCOS (polycystic ovarian syndrome)    Pneumonia 2018   Rash    in area of eswl 04-23-2017   Right ureteral calculus    Sleep apnea    Tachycardia cardiologist-  dr harding   controlled w/ metoprolol   Past Surgical  History:  Procedure Laterality Date   ANTERIOR CERVICAL DECOMP/DISCECTOMY FUSION N/A 09/01/2015   Procedure: ANTERIOR CERVICAL DECOMPRESSION/DISCECTOMY FUSION PLATING BONEGRAFT CERVICAL FIVE-SIX;  Surgeon: Kevan Ny Ditty, MD;  Location: [facility];  Service: Neurosurgery;  Laterality: N/A;   BIOPSY  05/20/2019   Procedure: BIOPSY;  Surgeon: Juanita Craver, MD;  Location: [facility];  Service: Endoscopy;;   BREAST BIOPSY Left 02/14/2022   U/S   BREAST BIOPSY Right 03/24/2022   MRI   BREAST EXCISIONAL BIOPSY Bilateral left 10/15/2002;   right 1997   left ductal system excision (papilloma w/ florid epithelial hyperplasia)/  right benign lumpectomy   BREAST LUMPECTOMY WITH RADIOACTIVE SEED LOCALIZATION Left 06/20/2022   Procedure: LEFT BREAST LUMPECTOMY WITH RADIOACTIVE SEED LOCALIZATION;  Surgeon: Erroll Luna, MD;  Location: [place];  Service: General;  Laterality: Left;   CARDIOVASCULAR STRESS TEST  04/27/2009   normal nuclear study w/ no ischemia/  normal LV function and wall motion , ef 66%   CHOLECYSTECTOMY N/A 03/13/2013   Procedure: LAPAROSCOPIC CHOLECYSTECTOMY;  Surgeon: Harl Bowie, MD;  Location: [place];  Service: General;  Laterality: N/A;   COLONOSCOPY  last one 11-08-2006   COLONOSCOPY N/A 05/20/2019   Procedure: COLONOSCOPY;  Surgeon: Juanita Craver, MD;  [facility];  Service: Endoscopy;  Laterality: N/A;   CYSTOSCOPY N/A 05/08/2018   Procedure: CYSTOSCOPY;  Surgeon: Paula Compton, MD;  Location: [facility];  Service: Gynecology;  Laterality: N/A;   CYSTOSCOPY WITH RETROGRADE PYELOGRAM, URETEROSCOPY AND STENT PLACEMENT Right 05/18/2017   Procedure: CYSTOSCOPY WITH RETROGRADE PYELOGRAM, URETEROSCOPY AND STENT PLACEMENT;  Surgeon: Alexis Frock, MD;  [facility];  Service: Urology;  Laterality: Right;   DX LAPAROSCOPY W/ LYSIS ADHESIONS/  LASER VAPORIZATION OF CERVIX  06/25/2002    ESOPHAGOGASTRODUODENOSCOPY  04/25/2005   ESOPHAGOGASTRODUODENOSCOPY (EGD) WITH PROPOFOL N/A 05/20/2019   Procedure: ESOPHAGOGASTRODUODENOSCOPY (EGD) WITH PROPOFOL;  Surgeon: Juanita Craver, MD;  [facility];  Service: Endoscopy;  Laterality: N/A;   EXPLORATORY LEFT THUMB REPAIR TENDON/ LACERATION  09/27/1999   EXTRACORPOREAL SHOCK WAVE LITHOTRIPSY Right 04/23/2017   Procedure: RIGHT EXTRACORPOREAL SHOCK WAVE LITHOTRIPSY (ESWL);  Surgeon: Alexis Frock, MD;  [facility];  Service: Urology;  Laterality: Right;   EXTRACORPOREAL SHOCK WAVE LITHOTRIPSY Right 04/23/2017   HOLMIUM LASER APPLICATION Right 11/28/3233   Procedure: HOLMIUM LASER APPLICATION;  Surgeon: Alexis Frock, MD;  Location[facility];  Service: Urology;  Laterality: Right;   LAPAROSCOPIC VAGINAL HYSTERECTOMY WITH SALPINGECTOMY Bilateral 05/08/2018   Procedure: LAPAROSCOPIC ASSISTED VAGINAL HYSTERECTOMY WITH SALPINGECTOMY,  I & D Sebaceous Left Labia;  Surgeon: Paula Compton, MD;  Location: [facility];  Service: Gynecology;  Laterality: Bilateral;   LAPAROSCOPY  07/30/2012   Procedure: LAPAROSCOPY DIAGNOSTIC;  Surgeon: Juliann Pulse  Gerarda Fraction, MD;  Location: [facility];  Service: Gynecology;  Laterality: N/A;   POLYPECTOMY  05/20/2019   Procedure: POLYPECTOMY;  Surgeon: Juanita Craver, MD;  [facility];  Service: Endoscopy;;   WISDOM TOOTH EXTRACTION     Family History  Problem Relation Age of Onset   Diabetes Father    Hypertension Father    Arthritis Father    Depression Father    Hearing loss Father    Diabetes Mother    Hypertension Mother    Arthritis Mother    Depression Mother    Asthma Paternal Grandmother    Kidney disease Paternal Grandmother    Arthritis Paternal Grandmother    Cancer Paternal Grandmother    COPD Paternal Grandmother    Hearing loss Paternal Grandmother    Heart disease Paternal Grandmother    Hypertension Paternal Grandmother    Hyperlipidemia Paternal  Grandmother    Stroke Paternal Grandmother    Heart attack Paternal Grandmother    Depression Sister    Emphysema Other    Tongue cancer Other    Coronary artery disease Other    COPD Son    Drug abuse Son    Arthritis Maternal Grandmother    COPD Maternal Grandmother    Depression Maternal Grandmother    Heart disease Maternal Grandmother    Early death Maternal Grandfather    Heart attack Maternal Grandfather    COPD Paternal Grandfather    Heart attack Paternal Grandfather    Breast cancer Neg Hx    Social History   Socioeconomic History   Marital status: Legally Separated    Spouse name: Not on file   Number of children: 2   Years of education: Not on file   Highest education level: Not on file  Occupational History   Occupation: Unemployed  Tobacco Use   Smoking status: Some Days    Packs/day: 0.50    Years: 25.00    Total pack years: 12.50    Types: Cigarettes   Smokeless tobacco: Never   Tobacco comments:    6-7 cig. daily  Vaping Use   Vaping Use: Never used  Substance and Sexual Activity   Alcohol use: No   Drug use: No    Types: Cocaine    Comment: last cocaine use 2014   Sexual activity: Not Currently    Partners: Male    Birth control/protection: None  Other Topics Concern   Not on file  Social History Narrative   She lives at home with her spouse. She does not exercise.   She currently denies recent recreational drug use.   Social Determinants of Health   Financial Resource Strain: Low Risk  (08/01/2022)   Overall Financial Resource Strain (CARDIA)    Difficulty of Paying Living Expenses: Not hard at all  Food Insecurity: No Food Insecurity (08/01/2022)   Hunger Vital Sign    Worried About Running Out of Food in the Last Year: Never true    Ran Out of Food in the Last Year: Never true  Transportation Needs: No Transportation Needs (08/01/2022)   PRAPARE - Hydrologist (Medical): No    Lack of Transportation  (Non-Medical): No  Physical Activity: Inactive (08/01/2022)   Exercise Vital Sign    Days of Exercise per Week: 0 days    Minutes of Exercise per Session: 0 min  Stress: Stress Concern Present (08/01/2022)   [place]  Feeling of Stress : To some extent  Social Connections: Socially Isolated (06/01/2021)   Social Connection and Isolation Panel [NHANES]    Frequency of Communication with Friends and Family: Once a week    Frequency of Social Gatherings with Friends and Family: Never    Attends Religious Services: Never    Printmaker: No    Attends Music therapist: Never    Marital Status: Separated    Tobacco Counseling Ready to quit: Yes Counseling given: Not Answered Tobacco comments: 6-7 cig. daily   Clinical Intake:  Pre-visit preparation completed: Yes  Pain : 0-10 Pain Score: 7  Pain Type: Acute pain Pain Location: Neck Pain Onset: 1 to 4 weeks ago Pain Frequency: Constant     Nutritional Status: BMI 25 -29 Overweight Nutritional Risks: None Diabetes: No  How often do you need to have someone help you when you read instructions, pamphlets, or other written materials from your doctor or pharmacy?: 1 - Never What is the last grade level you completed in school?: 12th grade  Diabetic? no  Interpreter Needed?: No  Information entered by :: NAllen LPN   Activities of Daily Living    08/01/2022    4:14 PM 06/20/2022    9:01 AM  In your present state of health, do you have any difficulty performing the following activities:  Hearing? 1 0  Vision? 1 0  Difficulty concentrating or making decisions? 1 0  Comment due to lyrica   Walking or climbing stairs? 0 0  Dressing or bathing? 0 0  Doing errands, shopping? 0   Preparing Food and eating ? N   Using the Toilet? N   In the past six months, have you accidently leaked urine? Y   Comment with sneeze or  cough   Do you have problems with loss of bowel control? N   Managing your Medications? N   Managing your Finances? N   Housekeeping or managing your Housekeeping? N     Patient Care Team: [place], [name], MD as PCP - General (Family Medicine)  Indicate any recent Medical Services you may have received from other than [facility] providers in the past year (date may be approximate).     Assessment:   This is a routine wellness examination for [place].  Hearing/Vision screen Vision Screening - Comments:: No regular eye exams, [facility]  Dietary issues and exercise activities discussed: Current Exercise Habits: The patient does not participate in regular exercise at present   Goals Addressed             This Visit's Progress    Patient Stated       08/01/2022, wants to stay busy       Depression Screen    08/01/2022    4:11 PM 06/14/2022   10:28 AM 12/28/2021    8:06 AM 06/01/2021    1:53 PM 04/02/2019    3:11 PM 11/07/2012    8:52 AM  PHQ 2/9 Scores  PHQ - 2 Score '6 4 6 2 6 1  '$ PHQ- 9 Score '12 17 22 6      '$ Fall Risk    08/01/2022    4:10 PM 06/14/2022   10:28 AM 12/28/2021    8:06 AM 06/01/2021    2:03 PM 04/02/2019    3:11 PM  Fall Risk   Falls in the past year? '1 1 1 '$ 0 0  Comment loses balance      Number falls in  past yr: 1 1 0 0 0  Injury with Fall? 0 1 1 0 0  Risk for fall due to : Impaired balance/gait;Medication side effect History of fall(s)  Impaired vision   Follow up Falls evaluation completed;Education provided;Falls prevention discussed Falls evaluation completed  Falls prevention discussed Education provided    FALL RISK PREVENTION PERTAINING TO THE HOME:  Any stairs in or around the home? No  If so, are there any without handrails? N/a Home free of loose throw rugs in walkways, pet beds, electrical cords, etc? Yes  Adequate lighting in your home to reduce risk of falls? Yes   ASSISTIVE DEVICES UTILIZED TO PREVENT FALLS:  Life alert? No  Use of a cane,  walker or w/c? No  Grab bars in the bathroom? No  Shower chair or bench in shower? Yes  Elevated toilet seat or a handicapped toilet? No   TIMED UP AND GO:  Was the test performed? No .      Cognitive Function:        08/01/2022    4:17 PM 06/01/2021    2:05 PM  6CIT Screen  What Year? 0 points 0 points  What month? 0 points 0 points  What time? 0 points 0 points  Count back from 20 0 points 0 points  Months in reverse 0 points 0 points  Repeat phrase 2 points 0 points  Total Score 2 points 0 points    Immunizations Immunization History  Administered Date(s) Administered   Influenza Whole 09/01/2009, 08/07/2012   Influenza,inj,Quad PF,6+ Mos 09/13/2018   Tdap 11/07/2012    TDAP status: Up to date  Flu Vaccine status: Due, Education has been provided regarding the importance of this vaccine. Advised may receive this vaccine at local pharmacy or Health Dept. Aware to provide a copy of the vaccination record if obtained from local pharmacy or Health Dept. Verbalized acceptance and understanding.  Pneumococcal vaccine status: Up to date  Covid-19 vaccine status: Completed vaccines  Qualifies for Shingles Vaccine? No   Zostavax completed No   Shingrix Completed?: n/a  Screening Tests Health Maintenance  Topic Date Due   COVID-19 Vaccine (1) Never done   INFLUENZA VACCINE  06/20/2022   TETANUS/TDAP  11/07/2022   COLONOSCOPY (Pts 45-81yr Insurance coverage will need to be confirmed)  05/19/2029   Hepatitis C Screening  Completed   HIV Screening  Completed   HPV VACCINES  Aged Out   PAP SMEAR-Modifier  Discontinued    Health Maintenance  Health Maintenance Due  Topic Date Due   COVID-19 Vaccine (1) Never done   INFLUENZA VACCINE  06/20/2022    Colorectal cancer screening: Type of screening: Colonoscopy. Completed 05/20/2019. Repeat every 10 years  Mammogram status: Completed 01/13/2022. Repeat every year  Bone Density status: n/a  Lung Cancer  Screening: (Low Dose CT Chest recommended if Age 49-80years, 30 pack-year currently smoking OR have quit w/in 15years.) does not qualify.   Lung Cancer Screening Referral: no  Additional Screening:  Hepatitis C Screening: does qualify; Completed 07/18/2022  Vision Screening: Recommended annual ophthalmology exams for early detection of glaucoma and other disorders of the eye. Is the patient up to date with their annual eye exam?  Yes  Who is the provider or what is the name of the office in which the patient attends annual eye exams? [facility] If pt is not established with a provider, would they like to be referred to a provider to establish care? No .   Dental  Screening: Recommended annual dental exams for proper oral hygiene  Community Resource Referral / Chronic Care Management: CRR required this visit?  No   CCM required this visit?  No      Plan:     I have personally reviewed and noted the following in the patient's chart:   Medical and social history Use of alcohol, tobacco or illicit drugs  Current medications and supplements including opioid prescriptions. Patient is not currently taking opioid prescriptions. Functional ability and status Nutritional status Physical activity Advanced directives List of other physicians Hospitalizations, surgeries, and ER visits in previous 12 months Vitals Screenings to include cognitive, depression, and falls Referrals and appointments  In addition, I have reviewed and discussed with patient certain preventive protocols, quality metrics, and best practice recommendations. A written personalized care plan for preventive services as well as general preventive health recommendations were provided to patient.     Kellie Simmering, LPN   1/95/0932   Nurse Notes: none  Due to this being a virtual visit, the after visit summary with patients personalized plan was offered to patient via mail or my-chart. Patient would like to access on  my-chart

## 2022-08-02 ENCOUNTER — Telehealth: Payer: Self-pay | Admitting: Family Medicine

## 2022-08-02 ENCOUNTER — Other Ambulatory Visit: Payer: Self-pay

## 2022-08-02 DIAGNOSIS — R8290 Unspecified abnormal findings in urine: Secondary | ICD-10-CM

## 2022-08-02 NOTE — Telephone Encounter (Signed)
Please make her a lab appointment.  The lab canceled the urine specimen, so I placed a new order for her.

## 2022-08-02 NOTE — Telephone Encounter (Signed)
Pt is calling and still having UTI symptoms and would like to know if we did urine culture

## 2022-08-03 ENCOUNTER — Encounter: Payer: Self-pay | Admitting: Family Medicine

## 2022-08-03 ENCOUNTER — Telehealth (INDEPENDENT_AMBULATORY_CARE_PROVIDER_SITE_OTHER): Payer: Medicare HMO | Admitting: Family Medicine

## 2022-08-03 ENCOUNTER — Telehealth: Payer: Self-pay | Admitting: Family Medicine

## 2022-08-03 VITALS — Temp 97.8°F | Ht 67.0 in | Wt 170.0 lb

## 2022-08-03 DIAGNOSIS — R059 Cough, unspecified: Secondary | ICD-10-CM

## 2022-08-03 DIAGNOSIS — R0981 Nasal congestion: Secondary | ICD-10-CM

## 2022-08-03 DIAGNOSIS — H9201 Otalgia, right ear: Secondary | ICD-10-CM

## 2022-08-03 MED ORDER — BENZONATATE 100 MG PO CAPS: 100.0000 mg | ORAL_CAPSULE | Freq: Three times a day (TID) | ORAL | 0 refills | Status: DC | PRN

## 2022-08-03 MED ORDER — AMOXICILLIN 500 MG PO CAPS: 500.0000 mg | ORAL_CAPSULE | Freq: Three times a day (TID) | ORAL | 0 refills | Status: AC

## 2022-08-03 NOTE — Telephone Encounter (Signed)
Pt said [facility] not have antiviral medication in stock and [id number] [place] [place] Phone:  [phone] Fax:  [phone]

## 2022-08-03 NOTE — Patient Instructions (Signed)
Do a covid test. If positive today can call back before 4 pm and to the [employer] office and tell them to get an urgent message to Dr. Maudie Mercury. I can then send in the covid medication for you. Alternatively you can contact a [facility] and they can assist you with getting the antiviral medication for covid.   -I sent the medication(s) we discussed to your pharmacy: Meds ordered this encounter  Medications   amoxicillin (AMOXIL) 500 MG capsule    Sig: Take 1 capsule (500 mg total) by mouth 3 (three) times daily for 10 days.    Dispense:  30 capsule    Refill:  0   benzonatate (TESSALON PERLES) 100 MG capsule    Sig: Take 1 capsule (100 mg total) by mouth 3 (three) times daily as needed.    Dispense:  20 capsule    Refill:  0   Nasal saline twice daily  I hope you are feeling better soon!  Seek in person care promptly if your symptoms worsen, new concerns arise or you are not improving with treatment.  It was nice to meet you today. I help [facility] out with telemedicine visits on Tuesdays and Thursdays and am happy to help if you need a virtual follow up visit on those days. Otherwise, if you have any concerns or questions following this visit please schedule a follow up visit with your Primary Care office or seek care at a local urgent care clinic to avoid delays in care. If you are having severe or life threatening symptoms please call 911 and/or go to the nearest emergency room.

## 2022-08-03 NOTE — Progress Notes (Signed)
Virtual Visit via Video Note  I connected with [name] [name]  on 08/03/22 at 10:20 AM EDT by a video enabled telemedicine application and verified that I am speaking with the correct person using two identifiers.  Location patient: [place] Location provider:work or home office Persons participating in the virtual visit: patient, provider  I discussed the limitations and requested verbal permission for telemedicine visit. The patient expressed understanding and agreed to proceed.   HPI:  Acute telemedicine visit for R ear pain and congestion: -Onset: 2-3 days ago (has had some allergies issues and bronchitis 2 weeks ago and then ear started hurting and is worse the last 2 days) -Symptoms include: the last 2 days also developed body aches, nasal congestion, cough, ear discomfort, headache, diarrhea, fever a few days ago, feels tired -Denies:CP, SOB, vomiting, inability to tol oral intake -Has tried:coricidin -Pertinent past medical history: see below, has had covid in the past twice -Pertinent medication allergies: Allergies  Allergen Reactions   Aspirin Shortness Of Breath and Other (See Comments)    Angiodema   Bee Venom Hives    Face swelling and chest pain   Diclofenac Anaphylaxis   Naproxen Swelling    Facial swelling   Nsaids Anaphylaxis    Swelling of eyes mouth and throat difficulty breathing   Prednisone     Worsened depression, suicidal ideation    Terbinafine And Related     Worsened depression, suicidal ideation   -COVID-19 vaccine status: had one dose of the covid vaccine Immunization History  Administered Date(s) Administered   Influenza Whole 09/01/2009, 08/07/2012   Influenza,inj,Quad PF,6+ Mos 09/13/2018   Tdap 11/07/2012     ROS: See pertinent positives and negatives per HPI.  Past Medical History:  Diagnosis Date   Anxiety    Bipolar disorder (Home)    Chronic kidney disease    kidney infections   Depression    Fibromyalgia    GERD (gastroesophageal reflux  disease)    History of cervical dysplasia    History of exercise intolerance    normal ETT 10-24-2016   History of kidney stones    Hyperlipidemia    Hypertension    Migraines    Osteoarthritis    PCOS (polycystic ovarian syndrome)    Pneumonia 2018   Rash    in area of eswl 04-23-2017   Right ureteral calculus    Sleep apnea    Tachycardia cardiologist-  dr harding   controlled w/ metoprolol    Past Surgical History:  Procedure Laterality Date   ANTERIOR CERVICAL DECOMP/DISCECTOMY FUSION N/A 09/01/2015   Procedure: ANTERIOR CERVICAL DECOMPRESSION/DISCECTOMY FUSION PLATING BONEGRAFT CERVICAL FIVE-SIX;  Surgeon: Kevan Ny Ditty, MD;  [facility];  Service: Neurosurgery;  Laterality: N/A;   BIOPSY  05/20/2019   Procedure: BIOPSY;  Surgeon: Juanita Craver, MD;  [facility];  Service: Endoscopy;;   BREAST BIOPSY Left 02/14/2022   U/S   BREAST BIOPSY Right 03/24/2022   MRI   BREAST EXCISIONAL BIOPSY Bilateral left 10/15/2002;   right 1997   left ductal system excision (papilloma w/ florid epithelial hyperplasia)/  right benign lumpectomy   BREAST LUMPECTOMY WITH RADIOACTIVE SEED LOCALIZATION Left 06/20/2022   Procedure: LEFT BREAST LUMPECTOMY WITH RADIOACTIVE SEED LOCALIZATION;  Surgeon: Erroll Luna, MD;  Location: [place];  Service: General;  Laterality: Left;   CARDIOVASCULAR STRESS TEST  04/27/2009   normal nuclear study w/ no ischemia/  normal LV function and wall motion , ef 66%   CHOLECYSTECTOMY N/A  03/13/2013   Procedure: LAPAROSCOPIC CHOLECYSTECTOMY;  Surgeon: Harl Bowie, MD;  Location: [place];  Service: General;  Laterality: N/A;   COLONOSCOPY  last one 11-08-2006   COLONOSCOPY N/A 05/20/2019   Procedure: COLONOSCOPY;  Surgeon: Juanita Craver, MD;  [facility];  Service: Endoscopy;  Laterality: N/A;   CYSTOSCOPY N/A 05/08/2018   Procedure: CYSTOSCOPY;  Surgeon: Paula Compton, MD;   Location: [facility];  Service: Gynecology;  Laterality: N/A;   CYSTOSCOPY WITH RETROGRADE PYELOGRAM, URETEROSCOPY AND STENT PLACEMENT Right 05/18/2017   Procedure: CYSTOSCOPY WITH RETROGRADE PYELOGRAM, URETEROSCOPY AND STENT PLACEMENT;  Surgeon: Alexis Frock, MD;  Location: [facility];  Service: Urology;  Laterality: Right;   DX LAPAROSCOPY W/ LYSIS ADHESIONS/  LASER VAPORIZATION OF CERVIX  06/25/2002   ESOPHAGOGASTRODUODENOSCOPY  04/25/2005   ESOPHAGOGASTRODUODENOSCOPY (EGD) WITH PROPOFOL N/A 05/20/2019   Procedure: ESOPHAGOGASTRODUODENOSCOPY (EGD) WITH PROPOFOL;  Surgeon: Juanita Craver, MD;  [facility];  Service: Endoscopy;  Laterality: N/A;   EXPLORATORY LEFT THUMB REPAIR TENDON/ LACERATION  09/27/1999   EXTRACORPOREAL SHOCK WAVE LITHOTRIPSY Right 04/23/2017   Procedure: RIGHT EXTRACORPOREAL SHOCK WAVE LITHOTRIPSY (ESWL);  Surgeon: Alexis Frock, MD;  [facility];  Service: Urology;  Laterality: Right;   EXTRACORPOREAL SHOCK WAVE LITHOTRIPSY Right 04/23/2017   HOLMIUM LASER APPLICATION Right 97/67/3419   Procedure: HOLMIUM LASER APPLICATION;  Surgeon: Alexis Frock, MD;  Location[facility];  Service: Urology;  Laterality: Right;   LAPAROSCOPIC VAGINAL HYSTERECTOMY WITH SALPINGECTOMY Bilateral 05/08/2018   Procedure: LAPAROSCOPIC ASSISTED VAGINAL HYSTERECTOMY WITH SALPINGECTOMY,  I & D Sebaceous Left Labia;  Surgeon: Paula Compton, MD;  Location: [facility];  Service: Gynecology;  Laterality: Bilateral;   LAPAROSCOPY  07/30/2012   Procedure: LAPAROSCOPY DIAGNOSTIC;  Surgeon: Logan Bores, MD;  Location: [facility];  Service: Gynecology;  Laterality: N/A;   POLYPECTOMY  05/20/2019   Procedure: POLYPECTOMY;  Surgeon: Juanita Craver, MD;  [facility];  Service: Endoscopy;;   WISDOM TOOTH EXTRACTION       Current Outpatient Medications:    acetaminophen (TYLENOL) 500 MG tablet, Take 1,000 mg by mouth every 6 (six) hours as needed  for moderate pain., Disp: , Rfl:    amoxicillin (AMOXIL) 500 MG capsule, Take 1 capsule (500 mg total) by mouth 3 (three) times daily for 10 days., Disp: 30 capsule, Rfl: 0   benzonatate (TESSALON PERLES) 100 MG capsule, Take 1 capsule (100 mg total) by mouth 3 (three) times daily as needed., Disp: 20 capsule, Rfl: 0   EPINEPHrine 0.3 mg/0.3 mL IJ SOAJ injection, Inject 0.3 mg into the muscle as needed for anaphylaxis., Disp: 2 each, Rfl: 0   escitalopram (LEXAPRO) 20 MG tablet, Take 20 mg by mouth at bedtime., Disp: , Rfl:    ferrous sulfate 325 (65 FE) MG tablet, Take 325 mg by mouth at bedtime., Disp: , Rfl:    lamoTRIgine (LAMICTAL) 100 MG tablet, TAKE 1 TABLET TWICE DAILY (Patient taking differently: Take 100 mg by mouth at bedtime.), Disp: 20 tablet, Rfl: 0   LINZESS 145 MCG CAPS capsule, TAKE 1 CAPSULE EVERY DAY AS NEEDED, Disp: 90 capsule, Rfl: 0   lurasidone (LATUDA) 20 MG TABS tablet, TAKE 1 TABLET AT BEDTIME, Disp: 10 tablet, Rfl: 0   Multiple Vitamins-Minerals (MULTIVITAMIN WITH MINERALS) tablet, Take 1 tablet by mouth at bedtime., Disp: , Rfl:    omeprazole (PRILOSEC) 40 MG capsule, TAKE 1 CAPSULE EVERY DAY, Disp: 90 capsule, Rfl: 1   pregabalin (LYRICA) 300 MG capsule,  Take 1 capsule (300 mg total) by mouth 2 (two) times daily., Disp: 60 capsule, Rfl: 3   propranolol (INDERAL) 40 MG tablet, Take 1 tablet (40 mg total) by mouth 2 (two) times daily. (Patient taking differently: Take 40 mg by mouth daily.), Disp: 180 tablet, Rfl: 2  EXAM:  VITALS per patient if applicable:  GENERAL: alert, oriented, appears well and in no acute distress  HEENT: atraumatic, conjunttiva clear, no obvious abnormalities on inspection of external nose and ears  NECK: normal movements of the head and neck  LUNGS: on inspection no signs of respiratory distress, breathing rate appears normal, no obvious gross SOB, gasping or wheezing  CV: no obvious cyanosis  MS: moves all visible extremities without  noticeable abnormality  PSYCH/NEURO: pleasant and cooperative, no obvious depression or anxiety, speech and thought processing grossly intact  ASSESSMENT AND PLAN:  Discussed the following assessment and plan:  Discomfort of right ear  Cough, unspecified type  Nasal congestion  -we discussed possible serious and likely etiologies, options for evaluation and workup, limitations of telemedicine visit vs in person visit, treatment, treatment risks and precautions. Pt is agreeable to treatment via telemedicine at this moment. Query new covid or VURI, possible OM or sinusitis vs other. Seeing a lot of covid in the community the last 2 weeks. She has opted for covid testing and will call back today for antiviral or contact [place] pharmacy if positive. If negative, she opted for nasal saline rinses, tessalon for cough and initiation of Amoxicillin.  Work/School slipped offered:  declined Advised to seek prompt virtual visit or in person care if worsening, new symptoms arise, or if is not improving with treatment as expected per our conversation of expected course. Discussed options for follow up care. Did let this patient know that I do telemedicine on Tuesdays and Thursdays for [place] and those are the days I am logged into the system. Advised to schedule follow up visit with PCP, [place] virtual visits or UCC if any further questions or concerns to avoid delays in care.   I discussed the assessment and treatment plan with the patient. The patient was provided an opportunity to ask questions and all were answered. The patient agreed with the plan and demonstrated an understanding of the instructions.     Lucretia Kern, DO

## 2022-08-03 NOTE — Telephone Encounter (Signed)
Pt had virtual with dr Maudie Mercury and per pt dr Maudie Mercury told her to let her know if she test positive for covid. Pt said she tested positive for covid and would like medication sent to  [place], [place] Phone:  [phone]  Fax:  [phone]

## 2022-08-03 NOTE — Telephone Encounter (Signed)
Pt tested positive for covid today and will callback to sch

## 2022-08-04 ENCOUNTER — Other Ambulatory Visit: Payer: Self-pay | Admitting: Family Medicine

## 2022-08-04 DIAGNOSIS — F332 Major depressive disorder, recurrent severe without psychotic features: Secondary | ICD-10-CM | POA: Diagnosis not present

## 2022-08-04 DIAGNOSIS — F4312 Post-traumatic stress disorder, chronic: Secondary | ICD-10-CM | POA: Diagnosis not present

## 2022-08-10 DIAGNOSIS — M5415 Radiculopathy, thoracolumbar region: Secondary | ICD-10-CM | POA: Diagnosis not present

## 2022-08-10 DIAGNOSIS — M542 Cervicalgia: Secondary | ICD-10-CM | POA: Diagnosis not present

## 2022-08-11 ENCOUNTER — Other Ambulatory Visit: Payer: Self-pay | Admitting: Neurosurgery

## 2022-08-11 DIAGNOSIS — M5415 Radiculopathy, thoracolumbar region: Secondary | ICD-10-CM

## 2022-08-11 DIAGNOSIS — M542 Cervicalgia: Secondary | ICD-10-CM

## 2022-08-15 DIAGNOSIS — F603 Borderline personality disorder: Secondary | ICD-10-CM | POA: Diagnosis not present

## 2022-08-18 DIAGNOSIS — F332 Major depressive disorder, recurrent severe without psychotic features: Secondary | ICD-10-CM | POA: Diagnosis not present

## 2022-08-18 DIAGNOSIS — F4312 Post-traumatic stress disorder, chronic: Secondary | ICD-10-CM | POA: Diagnosis not present

## 2022-08-22 DIAGNOSIS — F332 Major depressive disorder, recurrent severe without psychotic features: Secondary | ICD-10-CM | POA: Diagnosis not present

## 2022-08-22 DIAGNOSIS — F4312 Post-traumatic stress disorder, chronic: Secondary | ICD-10-CM | POA: Diagnosis not present

## 2022-08-27 ENCOUNTER — Other Ambulatory Visit: Payer: Medicare HMO

## 2022-08-28 ENCOUNTER — Telehealth: Payer: Self-pay | Admitting: *Deleted

## 2022-08-28 ENCOUNTER — Encounter: Payer: Self-pay | Admitting: *Deleted

## 2022-08-28 DIAGNOSIS — F332 Major depressive disorder, recurrent severe without psychotic features: Secondary | ICD-10-CM | POA: Diagnosis not present

## 2022-08-28 NOTE — Patient Outreach (Signed)
  Care Coordination   Initial Visit Note   08/28/2022 Name: [name] [name] MRN: [id number] DOB: 1971-04-15  [name] [name] is a 51 y.o. year old female who sees [place], Malka So, MD for primary care. I spoke with  [name] [name] by phone today.  What matters to the patients health and wellness today?  Flu vaccine    Goals Addressed               This Visit's Progress     COMPLETED: Flu Vaccine (pt-stated)        Care Coordination Interventions: Advised patient to visit with any local pharmacy/drug store or her provider's office for the FLU vaccine. Also provided [facility] pharmacy information on vaccine from 9-3 pm if needed however pt aware to call prior to arrival to verified availability. Reviewed medications with patient and discussed adherence to all her prescribed medications Reviewed scheduled/upcoming provider appointments including pending appointments and verified pt completed her AWV in Sept Screening for signs and symptoms of depression related to chronic disease state  Assessed social determinant of health barriers          SDOH assessments and interventions completed:  Yes  SDOH Interventions Today    Flowsheet Row Most Recent Value  SDOH Interventions   Food Insecurity Interventions Intervention Not Indicated  Housing Interventions Intervention Not Indicated  Transportation Interventions Intervention Not Indicated  Utilities Interventions Intervention Not Indicated  Depression Interventions/Treatment  Currently on Treatment, Counseling        Care Coordination Interventions Activated:  Yes  Care Coordination Interventions:  Yes, provided   Follow up plan: No further intervention required.   Encounter Outcome:  Pt. Visit Completed   Raina Mina, RN Care Management Coordinator [place] Office [phone]

## 2022-08-28 NOTE — Patient Instructions (Signed)
Visit Information  Thank you for taking time to visit with me today. Please don't hesitate to contact me if I can be of assistance to you.   Following are the goals we discussed today:   Goals Addressed               This Visit's Progress     COMPLETED: Flu Vaccine (pt-stated)        Care Coordination Interventions: Advised patient to visit with any local pharmacy/drug store or her provider's office for the FLU vaccine. Also provided [facility] pharmacy information on vaccine from 9-3 pm if needed however pt aware to call prior to arrival to verified availability. Reviewed medications with patient and discussed adherence to all her prescribed medications Reviewed scheduled/upcoming provider appointments including pending appointments and verified pt completed her AWV in Sept Screening for signs and symptoms of depression related to chronic disease state  Assessed social determinant of health barriers         Please call the care guide team at [phone] if you need to cancel or reschedule your appointment.   If you are experiencing a Mental Health or [place] or need someone to talk to, please call the Suicide and Crisis Lifeline: 988 call the [place] National Suicide Prevention Lifeline: (754)873-8015 or TTY: (918) 061-9029 TTY 916 689 2872) to talk to a trained counselor call 1-800-273-TALK (toll free, 24 hour hotline)  Patient verbalizes understanding of instructions and care plan provided today and agrees to view in [place]. Active MyChart status and patient understanding of how to access instructions and care plan via MyChart confirmed with patient.     No further follow up required: No additional needs  Raina Mina, RN Care Management Coordinator [place] Office [phone]

## 2022-09-01 ENCOUNTER — Ambulatory Visit
Admission: RE | Admit: 2022-09-01 | Discharge: 2022-09-01 | Disposition: A | Discharge status: Home / Self Care | Payer: Medicare HMO | Source: Ambulatory Visit | Attending: Neurosurgery | Admitting: Neurosurgery

## 2022-09-01 DIAGNOSIS — M5415 Radiculopathy, thoracolumbar region: Secondary | ICD-10-CM

## 2022-09-01 DIAGNOSIS — M542 Cervicalgia: Secondary | ICD-10-CM

## 2022-09-01 DIAGNOSIS — M546 Pain in thoracic spine: Secondary | ICD-10-CM | POA: Diagnosis not present

## 2022-09-01 DIAGNOSIS — M40204 Unspecified kyphosis, thoracic region: Secondary | ICD-10-CM | POA: Diagnosis not present

## 2022-09-01 DIAGNOSIS — M50223 Other cervical disc displacement at C6-C7 level: Secondary | ICD-10-CM | POA: Diagnosis not present

## 2022-09-04 DIAGNOSIS — F332 Major depressive disorder, recurrent severe without psychotic features: Secondary | ICD-10-CM | POA: Diagnosis not present

## 2022-09-05 DIAGNOSIS — F332 Major depressive disorder, recurrent severe without psychotic features: Secondary | ICD-10-CM | POA: Diagnosis not present

## 2022-09-06 DIAGNOSIS — F332 Major depressive disorder, recurrent severe without psychotic features: Secondary | ICD-10-CM | POA: Diagnosis not present

## 2022-09-07 DIAGNOSIS — F332 Major depressive disorder, recurrent severe without psychotic features: Secondary | ICD-10-CM | POA: Diagnosis not present

## 2022-09-07 DIAGNOSIS — F4312 Post-traumatic stress disorder, chronic: Secondary | ICD-10-CM | POA: Diagnosis not present

## 2022-09-08 DIAGNOSIS — F332 Major depressive disorder, recurrent severe without psychotic features: Secondary | ICD-10-CM | POA: Diagnosis not present

## 2022-09-12 DIAGNOSIS — F332 Major depressive disorder, recurrent severe without psychotic features: Secondary | ICD-10-CM | POA: Diagnosis not present

## 2022-09-13 DIAGNOSIS — F332 Major depressive disorder, recurrent severe without psychotic features: Secondary | ICD-10-CM | POA: Diagnosis not present

## 2022-09-14 ENCOUNTER — Telehealth (INDEPENDENT_AMBULATORY_CARE_PROVIDER_SITE_OTHER): Payer: Medicare HMO | Admitting: Family Medicine

## 2022-09-14 VITALS — Ht 67.0 in | Wt 175.0 lb

## 2022-09-14 DIAGNOSIS — R519 Headache, unspecified: Secondary | ICD-10-CM | POA: Diagnosis not present

## 2022-09-14 DIAGNOSIS — H9201 Otalgia, right ear: Secondary | ICD-10-CM | POA: Diagnosis not present

## 2022-09-14 DIAGNOSIS — R0981 Nasal congestion: Secondary | ICD-10-CM

## 2022-09-14 MED ORDER — AMOXICILLIN-POT CLAVULANATE 875-125 MG PO TABS: 1.0000 | ORAL_TABLET | Freq: Two times a day (BID) | ORAL | 0 refills | Status: DC

## 2022-09-14 NOTE — Patient Instructions (Signed)
-  nasal saline sinus rinse twice daily  -no dairy until better  -I sent the medication(s) we discussed to your pharmacy - an antibiotic to use if worsening or not improving as we discussed: Meds ordered this encounter  Medications   amoxicillin-clavulanate (AUGMENTIN) 875-125 MG tablet    Sig: Take 1 tablet by mouth 2 (two) times daily.    Dispense:  20 tablet    Refill:  0     I hope you are feeling better soon!  Seek in person care promptly if your symptoms worsen, new concerns arise or you are not improving with treatment.  It was nice to meet you today. I help [place] out with telemedicine visits on Tuesdays and Thursdays and am happy to help if you need a virtual follow up visit on those days. Otherwise, if you have any concerns or questions following this visit please schedule a follow up visit with your Primary Care office or seek care at a local urgent care clinic to avoid delays in care. If you are having severe or life threatening symptoms please call 911 and/or go to the nearest emergency room.

## 2022-09-14 NOTE — Progress Notes (Signed)
Virtual Visit via Video Note  I connected with "[name] [name]"  on 09/14/22 at 11:00 AM EDT by a video enabled telemedicine application and verified that I am speaking with the correct person using two identifiers.  Location patient: [place] Location provider:work or home office Persons participating in the virtual visit: patient, provider  I discussed the limitations and requested verbal permission for telemedicine visit. The patient expressed understanding and agreed to proceed.   HPI:  Acute telemedicine visit for "sinus infection": -Onset: worsening since 4 days ago -Symptoms include: nasal congestion - started more like a cold, but now since last night worsening with thick and dark yellow, sinus discomfort, R ear pain, headache, cough -Denies: fever, CP, SOB, NVD, worst  HA -Has tried: tylenol -Pertinent past medical history: see below, had covid over a month ago - but had improved from that -s/p hysterectomy -Pertinent medication allergies: Allergies  Allergen Reactions   Aspirin Shortness Of Breath and Other (See Comments)    Angiodema   Bee Venom Hives    Face swelling and chest pain   Diclofenac Anaphylaxis   Naproxen Swelling    Facial swelling   Nsaids Anaphylaxis    Swelling of eyes mouth and throat difficulty breathing   Prednisone     Worsened depression, suicidal ideation    Terbinafine And Related     Worsened depression, suicidal ideation  -COVID-19 vaccine status:  Immunization History  Administered Date(s) Administered   Influenza Whole 09/01/2009, 08/07/2012   Influenza,inj,Quad PF,6+ Mos 09/13/2018   Tdap 11/07/2012     ROS: See pertinent positives and negatives per HPI.  Past Medical History:  Diagnosis Date   Anxiety    Bipolar disorder ([place])    Chronic kidney disease    kidney infections   Depression    Fibromyalgia    GERD (gastroesophageal reflux disease)    History of cervical dysplasia    History of exercise intolerance    normal ETT 10-24-2016    History of kidney stones    Hyperlipidemia    Hypertension    Migraines    Osteoarthritis    PCOS (polycystic ovarian syndrome)    Pneumonia 2018   Rash    in area of eswl 04-23-2017   Right ureteral calculus    Sleep apnea    Tachycardia cardiologist-  dr harding   controlled w/ metoprolol    Past Surgical History:  Procedure Laterality Date   ANTERIOR CERVICAL DECOMP/DISCECTOMY FUSION N/A 09/01/2015   Procedure: ANTERIOR CERVICAL DECOMPRESSION/DISCECTOMY FUSION PLATING BONEGRAFT CERVICAL FIVE-SIX;  Surgeon: Kevan Ny Ditty, MD;  [facility];  Service: Neurosurgery;  Laterality: N/A;   BIOPSY  05/20/2019   Procedure: BIOPSY;  Surgeon: Juanita Craver, MD;  [facility];  Service: Endoscopy;;   BREAST BIOPSY Left 02/14/2022   U/S   BREAST BIOPSY Right 03/24/2022   MRI   BREAST EXCISIONAL BIOPSY Bilateral left 10/15/2002;   right 1997   left ductal system excision (papilloma w/ florid epithelial hyperplasia)/  right benign lumpectomy   BREAST LUMPECTOMY WITH RADIOACTIVE SEED LOCALIZATION Left 06/20/2022   Procedure: LEFT BREAST LUMPECTOMY WITH RADIOACTIVE SEED LOCALIZATION;  Surgeon: Erroll Luna, MD;  Location: [place];  Service: General;  Laterality: Left;   CARDIOVASCULAR STRESS TEST  04/27/2009   normal nuclear study w/ no ischemia/  normal LV function and wall motion , ef 66%   CHOLECYSTECTOMY N/A 03/13/2013   Procedure: LAPAROSCOPIC CHOLECYSTECTOMY;  Surgeon: Harl Bowie, MD;  Location: [place];  Service: General;  Laterality: N/A;   COLONOSCOPY  last one 11-08-2006   COLONOSCOPY N/A 05/20/2019   Procedure: COLONOSCOPY;  Surgeon: Juanita Craver, MD;  [facility];  Service: Endoscopy;  Laterality: N/A;   CYSTOSCOPY N/A 05/08/2018   Procedure: CYSTOSCOPY;  Surgeon: Paula Compton, MD;  Location: [facility];  Service: Gynecology;  Laterality: N/A;   CYSTOSCOPY WITH RETROGRADE PYELOGRAM, URETEROSCOPY  AND STENT PLACEMENT Right 05/18/2017   Procedure: CYSTOSCOPY WITH RETROGRADE PYELOGRAM, URETEROSCOPY AND STENT PLACEMENT;  Surgeon: Alexis Frock, MD;  Location[facility];  Service: Urology;  Laterality: Right;   DX LAPAROSCOPY W/ LYSIS ADHESIONS/  LASER VAPORIZATION OF CERVIX  06/25/2002   ESOPHAGOGASTRODUODENOSCOPY  04/25/2005   ESOPHAGOGASTRODUODENOSCOPY (EGD) WITH PROPOFOL N/A 05/20/2019   Procedure: ESOPHAGOGASTRODUODENOSCOPY (EGD) WITH PROPOFOL;  Surgeon: Juanita Craver, MD;  [facility];  Service: Endoscopy;  Laterality: N/A;   EXPLORATORY LEFT THUMB REPAIR TENDON/ LACERATION  09/27/1999   EXTRACORPOREAL SHOCK WAVE LITHOTRIPSY Right 04/23/2017   Procedure: RIGHT EXTRACORPOREAL SHOCK WAVE LITHOTRIPSY (ESWL);  Surgeon: Alexis Frock, MD;  [facility];  Service: Urology;  Laterality: Right;   EXTRACORPOREAL SHOCK WAVE LITHOTRIPSY Right 04/23/2017   HOLMIUM LASER APPLICATION Right 46/96/2952   Procedure: HOLMIUM LASER APPLICATION;  Surgeon: Alexis Frock, MD;  Location: [facility];  Service: Urology;  Laterality: Right;   LAPAROSCOPIC VAGINAL HYSTERECTOMY WITH SALPINGECTOMY Bilateral 05/08/2018   Procedure: LAPAROSCOPIC ASSISTED VAGINAL HYSTERECTOMY WITH SALPINGECTOMY,  I & D Sebaceous Left Labia;  Surgeon: Paula Compton, MD;  Location: [facility];  Service: Gynecology;  Laterality: Bilateral;   LAPAROSCOPY  07/30/2012   Procedure: LAPAROSCOPY DIAGNOSTIC;  Surgeon: Logan Bores, MD;  Location: [facility];  Service: Gynecology;  Laterality: N/A;   POLYPECTOMY  05/20/2019   Procedure: POLYPECTOMY;  Surgeon: Juanita Craver, MD;  [facility];  Service: Endoscopy;;   WISDOM TOOTH EXTRACTION       Current Outpatient Medications:    acetaminophen (TYLENOL) 500 MG tablet, Take 1,000 mg by mouth every 6 (six) hours as needed for moderate pain., Disp: , Rfl:    amoxicillin-clavulanate (AUGMENTIN) 875-125 MG tablet, Take 1 tablet by  mouth 2 (two) times daily., Disp: 20 tablet, Rfl: 0   benztropine (COGENTIN) 0.5 MG tablet, Take 0.5 mg by mouth 2 (two) times daily., Disp: , Rfl:    clonazePAM (KLONOPIN) 0.5 MG tablet, Take 0.25 mg by mouth 2 (two) times daily., Disp: , Rfl:    doxepin (SINEQUAN) 10 MG capsule, Take 10 mg by mouth at bedtime., Disp: , Rfl:    EPINEPHrine 0.3 mg/0.3 mL IJ SOAJ injection, Inject 0.3 mg into the muscle as needed for anaphylaxis., Disp: 2 each, Rfl: 0   ferrous sulfate 325 (65 FE) MG tablet, Take 325 mg by mouth at bedtime., Disp: , Rfl:    lamoTRIgine (LAMICTAL) 100 MG tablet, TAKE 1 TABLET TWICE DAILY (Patient taking differently: Take 100 mg by mouth at bedtime.), Disp: 20 tablet, Rfl: 0   LINZESS 145 MCG CAPS capsule, TAKE 1 CAPSULE EVERY DAY AS NEEDED, Disp: 90 capsule, Rfl: 0   Multiple Vitamins-Minerals (MULTIVITAMIN WITH MINERALS) tablet, Take 1 tablet by mouth at bedtime., Disp: , Rfl:    omeprazole (PRILOSEC) 40 MG capsule, TAKE 1 CAPSULE EVERY DAY, Disp: 90 capsule, Rfl: 1   pregabalin (LYRICA) 300 MG capsule, Take 1 capsule (300 mg total) by mouth 2 (two) times daily., Disp: 60 capsule, Rfl: 3   propranolol (INDERAL) 40 MG tablet, Take 1 tablet (40 mg total)  by mouth 2 (two) times daily. (Patient taking differently: Take 40 mg by mouth daily.), Disp: 180 tablet, Rfl: 2   sertraline (ZOLOFT) 25 MG tablet, Take 25 mg by mouth at bedtime., Disp: , Rfl:   EXAM:  VITALS per patient if applicable:  GENERAL: alert, oriented, appears well and in no acute distress  HEENT: atraumatic, conjunttiva clear, no obvious abnormalities on inspection of external nose and ears  NECK: normal movements of the head and neck  LUNGS: on inspection no signs of respiratory distress, breathing rate appears normal, no obvious gross SOB, gasping or wheezing  CV: no obvious cyanosis  MS: moves all visible extremities without noticeable abnormality  PSYCH/NEURO: pleasant and cooperative, no obvious  depression or anxiety, speech and thought processing grossly intact  ASSESSMENT AND PLAN:  Discussed the following assessment and plan:  Nasal congestion  Right ear pain  Facial discomfort  -we discussed possible serious and likely etiologies, options for evaluation and workup, limitations of telemedicine visit vs in person visit, treatment, treatment risks and precautions. Pt is agreeable to treatment via telemedicine at this moment. Query VURI, sinusitis (viral or bacterial) vs other.  Possibility of covid again - but less likely. She has opted for covid testing, nasal saline sinus rinses, tylenol if needed and delayed Augmentin rx if worsening or not improving with other measures. Discussed risks and appropriate use of abx.   Advised to seek prompt virtual visit or in person care if worsening, new symptoms arise, or if is not improving with treatment as expected per our conversation of expected course. Discussed options for follow up care. Did let this patient know that I do telemedicine on Tuesdays and Thursdays for [place] and those are the days I am logged into the system. Advised to schedule follow up visit with PCP, [place] virtual visits or UCC if any further questions or concerns to avoid delays in care.   I discussed the assessment and treatment plan with the patient. The patient was provided an opportunity to ask questions and all were answered. The patient agreed with the plan and demonstrated an understanding of the instructions.     Lucretia Kern, DO

## 2022-09-18 DIAGNOSIS — F332 Major depressive disorder, recurrent severe without psychotic features: Secondary | ICD-10-CM | POA: Diagnosis not present

## 2022-09-19 DIAGNOSIS — F332 Major depressive disorder, recurrent severe without psychotic features: Secondary | ICD-10-CM | POA: Diagnosis not present

## 2022-09-21 DIAGNOSIS — F332 Major depressive disorder, recurrent severe without psychotic features: Secondary | ICD-10-CM | POA: Diagnosis not present

## 2022-09-25 DIAGNOSIS — H5203 Hypermetropia, bilateral: Secondary | ICD-10-CM | POA: Diagnosis not present

## 2022-09-26 DIAGNOSIS — F4312 Post-traumatic stress disorder, chronic: Secondary | ICD-10-CM | POA: Diagnosis not present

## 2022-10-02 ENCOUNTER — Ambulatory Visit: Payer: Medicare HMO | Admitting: Family Medicine

## 2022-10-03 NOTE — Progress Notes (Deleted)
ACUTE VISIT No chief complaint on file.  HPI: [name] is a 51 y.o. female, who is here today complaining of *** HPI  Review of Systems See other pertinent positives and negatives in HPI.  Current Outpatient Medications on File Prior to Visit  Medication Sig Dispense Refill   acetaminophen (TYLENOL) 500 MG tablet Take 1,000 mg by mouth every 6 (six) hours as needed for moderate pain.     amoxicillin-clavulanate (AUGMENTIN) 875-125 MG tablet Take 1 tablet by mouth 2 (two) times daily. 20 tablet 0   benztropine (COGENTIN) 0.5 MG tablet Take 0.5 mg by mouth 2 (two) times daily.     clonazePAM (KLONOPIN) 0.5 MG tablet Take 0.25 mg by mouth 2 (two) times daily.     doxepin (SINEQUAN) 10 MG capsule Take 10 mg by mouth at bedtime.     EPINEPHrine 0.3 mg/0.3 mL IJ SOAJ injection Inject 0.3 mg into the muscle as needed for anaphylaxis. 2 each 0   ferrous sulfate 325 (65 FE) MG tablet Take 325 mg by mouth at bedtime.     lamoTRIgine (LAMICTAL) 100 MG tablet TAKE 1 TABLET TWICE DAILY (Patient taking differently: Take 100 mg by mouth at bedtime.) 20 tablet 0   LINZESS 145 MCG CAPS capsule TAKE 1 CAPSULE EVERY DAY AS NEEDED 90 capsule 0   Multiple Vitamins-Minerals (MULTIVITAMIN WITH MINERALS) tablet Take 1 tablet by mouth at bedtime.     omeprazole (PRILOSEC) 40 MG capsule TAKE 1 CAPSULE EVERY DAY 90 capsule 1   pregabalin (LYRICA) 300 MG capsule Take 1 capsule (300 mg total) by mouth 2 (two) times daily. 60 capsule 3   propranolol (INDERAL) 40 MG tablet Take 1 tablet (40 mg total) by mouth 2 (two) times daily. (Patient taking differently: Take 40 mg by mouth daily.) 180 tablet 2   sertraline (ZOLOFT) 25 MG tablet Take 25 mg by mouth at bedtime.     No current facility-administered medications on file prior to visit.    Past Medical History:  Diagnosis Date   Anxiety    Bipolar disorder ([place])    Chronic kidney disease    kidney infections   Depression    Fibromyalgia    GERD  (gastroesophageal reflux disease)    History of cervical dysplasia    History of exercise intolerance    normal ETT 10-24-2016   History of kidney stones    Hyperlipidemia    Hypertension    Migraines    Osteoarthritis    PCOS (polycystic ovarian syndrome)    Pneumonia 2018   Rash    in area of eswl 04-23-2017   Right ureteral calculus    Sleep apnea    Tachycardia cardiologist-  dr harding   controlled w/ metoprolol   Allergies  Allergen Reactions   Aspirin Shortness Of Breath and Other (See Comments)    Angiodema   Bee Venom Hives    Face swelling and chest pain   Diclofenac Anaphylaxis   Naproxen Swelling    Facial swelling   Nsaids Anaphylaxis    Swelling of eyes mouth and throat difficulty breathing   Prednisone     Worsened depression, suicidal ideation    Terbinafine And Related     Worsened depression, suicidal ideation    Social History   Socioeconomic History   Marital status: Legally Separated    Spouse name: Not on file   Number of children: 2   Years of education: Not on file   Highest education level:  Not on file  Occupational History   Occupation: Unemployed  Tobacco Use   Smoking status: Some Days    Packs/day: 0.50    Years: 25.00    Total pack years: 12.50    Types: Cigarettes   Smokeless tobacco: Never   Tobacco comments:    6-7 cig. daily  Vaping Use   Vaping Use: Never used  Substance and Sexual Activity   Alcohol use: No   Drug use: No    Types: Cocaine    Comment: last cocaine use 2014   Sexual activity: Not Currently    Partners: Male    Birth control/protection: None  Other Topics Concern   Not on file  Social History Narrative   She lives at home with her spouse. She does not exercise.   She currently denies recent recreational drug use.   Social Determinants of Health   Financial Resource Strain: Low Risk  (08/01/2022)   Overall Financial Resource Strain (CARDIA)    Difficulty of Paying Living Expenses: Not hard at  all  Food Insecurity: No Food Insecurity (08/28/2022)   Hunger Vital Sign    Worried About Running Out of Food in the Last Year: Never true    Ran Out of Food in the Last Year: Never true  Transportation Needs: No Transportation Needs (08/28/2022)   PRAPARE - Hydrologist (Medical): No    Lack of Transportation (Non-Medical): No  Physical Activity: Inactive (08/01/2022)   Exercise Vital Sign    Days of Exercise per Week: 0 days    Minutes of Exercise per Session: 0 min  Stress: Stress Concern Present (08/01/2022)   [place]    Feeling of Stress : To some extent  Social Connections: Socially Isolated (06/01/2021)   Social Connection and Isolation Panel [NHANES]    Frequency of Communication with Friends and Family: Once a week    Frequency of Social Gatherings with Friends and Family: Never    Attends Religious Services: Never    Marine scientist or Organizations: No    Attends Archivist Meetings: Never    Marital Status: Separated    There were no vitals filed for this visit. There is no height or weight on file to calculate BMI.  Physical Exam  ASSESSMENT AND PLAN: There are no diagnoses linked to this encounter.  No follow-ups on file.  [name]. Martinique, MD  [facility]. [place] office.  Discharge Instructions   None

## 2022-10-04 ENCOUNTER — Ambulatory Visit: Payer: Medicare HMO | Admitting: Family Medicine

## 2022-10-09 DIAGNOSIS — F4312 Post-traumatic stress disorder, chronic: Secondary | ICD-10-CM | POA: Diagnosis not present

## 2022-10-09 NOTE — Progress Notes (Unsigned)
ACUTE VISIT No chief complaint on file.  HPI: [name] is a 51 y.o. female, who is here today complaining of *** HPI  Review of Systems See other pertinent positives and negatives in HPI.  Current Outpatient Medications on File Prior to Visit  Medication Sig Dispense Refill   acetaminophen (TYLENOL) 500 MG tablet Take 1,000 mg by mouth every 6 (six) hours as needed for moderate pain.     amoxicillin-clavulanate (AUGMENTIN) 875-125 MG tablet Take 1 tablet by mouth 2 (two) times daily. 20 tablet 0   benztropine (COGENTIN) 0.5 MG tablet Take 0.5 mg by mouth 2 (two) times daily.     clonazePAM (KLONOPIN) 0.5 MG tablet Take 0.25 mg by mouth 2 (two) times daily.     doxepin (SINEQUAN) 10 MG capsule Take 10 mg by mouth at bedtime.     EPINEPHrine 0.3 mg/0.3 mL IJ SOAJ injection Inject 0.3 mg into the muscle as needed for anaphylaxis. 2 each 0   ferrous sulfate 325 (65 FE) MG tablet Take 325 mg by mouth at bedtime.     lamoTRIgine (LAMICTAL) 100 MG tablet TAKE 1 TABLET TWICE DAILY (Patient taking differently: Take 100 mg by mouth at bedtime.) 20 tablet 0   LINZESS 145 MCG CAPS capsule TAKE 1 CAPSULE EVERY DAY AS NEEDED 90 capsule 0   Multiple Vitamins-Minerals (MULTIVITAMIN WITH MINERALS) tablet Take 1 tablet by mouth at bedtime.     omeprazole (PRILOSEC) 40 MG capsule TAKE 1 CAPSULE EVERY DAY 90 capsule 1   pregabalin (LYRICA) 300 MG capsule Take 1 capsule (300 mg total) by mouth 2 (two) times daily. 60 capsule 3   propranolol (INDERAL) 40 MG tablet Take 1 tablet (40 mg total) by mouth 2 (two) times daily. (Patient taking differently: Take 40 mg by mouth daily.) 180 tablet 2   sertraline (ZOLOFT) 25 MG tablet Take 25 mg by mouth at bedtime.     No current facility-administered medications on file prior to visit.    Past Medical History:  Diagnosis Date   Anxiety    Bipolar disorder ([place])    Chronic kidney disease    kidney infections   Depression    Fibromyalgia    GERD  (gastroesophageal reflux disease)    History of cervical dysplasia    History of exercise intolerance    normal ETT 10-24-2016   History of kidney stones    Hyperlipidemia    Hypertension    Migraines    Osteoarthritis    PCOS (polycystic ovarian syndrome)    Pneumonia 2018   Rash    in area of eswl 04-23-2017   Right ureteral calculus    Sleep apnea    Tachycardia cardiologist-  dr harding   controlled w/ metoprolol   Allergies  Allergen Reactions   Aspirin Shortness Of Breath and Other (See Comments)    Angiodema   Bee Venom Hives    Face swelling and chest pain   Diclofenac Anaphylaxis   Naproxen Swelling    Facial swelling   Nsaids Anaphylaxis    Swelling of eyes mouth and throat difficulty breathing   Prednisone     Worsened depression, suicidal ideation    Terbinafine And Related     Worsened depression, suicidal ideation    Social History   Socioeconomic History   Marital status: Legally Separated    Spouse name: Not on file   Number of children: 2   Years of education: Not on file   Highest education level:  Not on file  Occupational History   Occupation: Unemployed  Tobacco Use   Smoking status: Some Days    Packs/day: 0.50    Years: 25.00    Total pack years: 12.50    Types: Cigarettes   Smokeless tobacco: Never   Tobacco comments:    6-7 cig. daily  Vaping Use   Vaping Use: Never used  Substance and Sexual Activity   Alcohol use: No   Drug use: No    Types: Cocaine    Comment: last cocaine use 2014   Sexual activity: Not Currently    Partners: Male    Birth control/protection: None  Other Topics Concern   Not on file  Social History Narrative   She lives at home with her spouse. She does not exercise.   She currently denies recent recreational drug use.   Social Determinants of Health   Financial Resource Strain: Low Risk  (08/01/2022)   Overall Financial Resource Strain (CARDIA)    Difficulty of Paying Living Expenses: Not hard at  all  Food Insecurity: No Food Insecurity (08/28/2022)   Hunger Vital Sign    Worried About Running Out of Food in the Last Year: Never true    Ran Out of Food in the Last Year: Never true  Transportation Needs: No Transportation Needs (08/28/2022)   PRAPARE - Hydrologist (Medical): No    Lack of Transportation (Non-Medical): No  Physical Activity: Inactive (08/01/2022)   Exercise Vital Sign    Days of Exercise per Week: 0 days    Minutes of Exercise per Session: 0 min  Stress: Stress Concern Present (08/01/2022)   [place]    Feeling of Stress : To some extent  Social Connections: Socially Isolated (06/01/2021)   Social Connection and Isolation Panel [NHANES]    Frequency of Communication with Friends and Family: Once a week    Frequency of Social Gatherings with Friends and Family: Never    Attends Religious Services: Never    Marine scientist or Organizations: No    Attends Archivist Meetings: Never    Marital Status: Separated    There were no vitals filed for this visit. There is no height or weight on file to calculate BMI.  Physical Exam  ASSESSMENT AND PLAN: There are no diagnoses linked to this encounter.  No follow-ups on file.  Trevious Rampey G. Martinique, MD  [facility]. [place] office.  Discharge Instructions   None

## 2022-10-10 ENCOUNTER — Ambulatory Visit (INDEPENDENT_AMBULATORY_CARE_PROVIDER_SITE_OTHER): Payer: Medicare HMO | Admitting: Family Medicine

## 2022-10-10 ENCOUNTER — Encounter: Payer: Self-pay | Admitting: Family Medicine

## 2022-10-10 VITALS — BP 128/70 | HR 76 | Temp 97.7°F | Resp 16 | Ht 67.0 in | Wt 169.1 lb

## 2022-10-10 DIAGNOSIS — G44301 Post-traumatic headache, unspecified, intractable: Secondary | ICD-10-CM

## 2022-10-10 DIAGNOSIS — F332 Major depressive disorder, recurrent severe without psychotic features: Secondary | ICD-10-CM | POA: Diagnosis not present

## 2022-10-10 DIAGNOSIS — R300 Dysuria: Secondary | ICD-10-CM | POA: Diagnosis not present

## 2022-10-10 NOTE — Assessment & Plan Note (Signed)
I am not certain that headache could be related to recent treatment with [place].  Recommend inquiring about the possible side effects of the procedure. Continue following with psychiatrist.

## 2022-10-10 NOTE — Patient Instructions (Signed)
A few things to remember from today's visit:  Intractable post-traumatic headache, unspecified chronicity pattern - Plan: CT HEAD WO CONTRAST (5MM)  Dysuria - Plan: Urinalysis with Culture Reflex  Caution with the amount of tylenol you take, continue 500 mg 3-4 times daily as needed. If suddenly worse you need to go the the ED. Head CT order has been arranged. Ask your psychiatrist if depression treatment could be causing headaches. Keep appt with eye care provider.  If you need refills for medications you take chronically, please call your pharmacy. Do not use My Chart to request refills or for acute issues that need immediate attention. If you send a my chart message, it may take a few days to be addressed, specially if I am not in the office.  Please be sure medication list is accurate. If a new problem present, please set up appointment sooner than planned today.

## 2022-10-10 NOTE — Assessment & Plan Note (Signed)
Problem has been recording for a few months. We discussed possible etiologies. We will hold on recommendations in regard to empiric antibiotic treatment until UA and urine culture result.

## 2022-10-10 NOTE — Assessment & Plan Note (Signed)
She is reporting this is a new problem. Neurologic examination today does not suggest a serious process. Placed an order for a head CT. Advise caution with Tylenol intake, limited to 500 mg 3-4 times per day as needed. Continue monitoring for new symptoms. She was clearly instructed about warning signs. We will consider neurology referral if headache does not improve.

## 2022-10-11 ENCOUNTER — Encounter: Payer: Self-pay | Admitting: Family Medicine

## 2022-10-14 LAB — URINALYSIS W MICROSCOPIC + REFLEX CULTURE
Bilirubin Urine: NEGATIVE
Glucose, UA: NEGATIVE
Ketones, ur: NEGATIVE
Nitrites, Initial: NEGATIVE
Specific Gravity, Urine: 1.016 (ref 1.001–1.035)
Squamous Epithelial / HPF: NONE SEEN /HPF (ref <=5)
pH: 7 (ref 5.0–8.0)

## 2022-10-14 LAB — URINE CULTURE
MICRO NUMBER:: 14224053
SPECIMEN QUALITY:: ADEQUATE

## 2022-10-14 LAB — CULTURE INDICATED

## 2022-10-14 MED ORDER — SULFAMETHOXAZOLE-TRIMETHOPRIM 800-160 MG PO TABS: 1.0000 | ORAL_TABLET | Freq: Two times a day (BID) | ORAL | 0 refills | Status: AC

## 2022-10-14 NOTE — Addendum Note (Signed)
Addended by: Martinique, Tedd Cottrill G on: 10/14/2022 10:45 PM   Modules accepted: Orders

## 2022-10-16 ENCOUNTER — Telehealth: Payer: Self-pay | Admitting: Family Medicine

## 2022-10-16 DIAGNOSIS — G44301 Post-traumatic headache, unspecified, intractable: Secondary | ICD-10-CM

## 2022-10-16 NOTE — Telephone Encounter (Signed)
I placed new order, needs to be sent to [place] location of [facility].

## 2022-10-16 NOTE — Telephone Encounter (Signed)
Pt is sch for [facility] location on 10-24-2022 for CT HEAD WO CONTRAST and pt said [facility] in  can do ct head this Friday 10-20-2022 . Please call [phone] and send order to [facility]

## 2022-10-20 ENCOUNTER — Ambulatory Visit
Admission: RE | Admit: 2022-10-20 | Discharge: 2022-10-20 | Disposition: A | Discharge status: Home / Self Care | Payer: Medicare HMO | Source: Ambulatory Visit | Attending: Family Medicine | Admitting: Family Medicine

## 2022-10-20 DIAGNOSIS — S0990XA Unspecified injury of head, initial encounter: Secondary | ICD-10-CM | POA: Diagnosis not present

## 2022-10-20 DIAGNOSIS — G44301 Post-traumatic headache, unspecified, intractable: Secondary | ICD-10-CM

## 2022-10-20 DIAGNOSIS — R519 Headache, unspecified: Secondary | ICD-10-CM | POA: Diagnosis not present

## 2022-10-23 ENCOUNTER — Other Ambulatory Visit: Payer: Self-pay | Admitting: Family Medicine

## 2022-10-23 DIAGNOSIS — M797 Fibromyalgia: Secondary | ICD-10-CM

## 2022-10-24 ENCOUNTER — Ambulatory Visit (HOSPITAL_BASED_OUTPATIENT_CLINIC_OR_DEPARTMENT_OTHER): Payer: Medicare HMO

## 2022-10-24 DIAGNOSIS — S060XAA Concussion with loss of consciousness status unknown, initial encounter: Secondary | ICD-10-CM | POA: Diagnosis not present

## 2022-10-24 DIAGNOSIS — H43812 Vitreous degeneration, left eye: Secondary | ICD-10-CM | POA: Diagnosis not present

## 2022-10-24 NOTE — Telephone Encounter (Signed)
Last filled 09/26/22

## 2022-10-25 DIAGNOSIS — F4312 Post-traumatic stress disorder, chronic: Secondary | ICD-10-CM | POA: Diagnosis not present

## 2022-10-27 ENCOUNTER — Other Ambulatory Visit: Payer: Self-pay | Admitting: Family Medicine

## 2022-10-27 DIAGNOSIS — M797 Fibromyalgia: Secondary | ICD-10-CM

## 2022-10-27 NOTE — Telephone Encounter (Signed)
Pt would like to know if it would be possible for MD to send her refill for the pregabalin (LYRICA) 300 MG capsule to the:  [facility], [place] Phone: [phone]  Fax: [phone]     Instead of via [facility] because she is almost out and is afraid to miss any days.  Please advise.

## 2022-11-01 ENCOUNTER — Other Ambulatory Visit: Payer: Self-pay

## 2022-11-01 MED ORDER — AMOXICILLIN-POT CLAVULANATE 875-125 MG PO TABS: 1.0000 | ORAL_TABLET | Freq: Two times a day (BID) | ORAL | 0 refills | Status: DC

## 2022-11-03 DIAGNOSIS — H524 Presbyopia: Secondary | ICD-10-CM | POA: Diagnosis not present

## 2022-11-03 DIAGNOSIS — H52209 Unspecified astigmatism, unspecified eye: Secondary | ICD-10-CM | POA: Diagnosis not present

## 2022-11-03 DIAGNOSIS — H5203 Hypermetropia, bilateral: Secondary | ICD-10-CM | POA: Diagnosis not present

## 2022-11-08 DIAGNOSIS — F4312 Post-traumatic stress disorder, chronic: Secondary | ICD-10-CM | POA: Diagnosis not present

## 2022-11-22 DIAGNOSIS — F4312 Post-traumatic stress disorder, chronic: Secondary | ICD-10-CM | POA: Diagnosis not present

## 2022-11-27 DIAGNOSIS — J324 Chronic pansinusitis: Secondary | ICD-10-CM | POA: Diagnosis not present

## 2022-11-27 DIAGNOSIS — J209 Acute bronchitis, unspecified: Secondary | ICD-10-CM | POA: Diagnosis not present

## 2022-11-28 ENCOUNTER — Telehealth: Payer: Self-pay

## 2022-11-28 NOTE — Telephone Encounter (Signed)
---  Caller states she is having a cough, headache. She is also having productive cough with dark color sputum. Shortness of breath. Deep yellow thick mucous from her nose. Started feeling worse yesterday. Can't take a deep breath due to coughing and mucous. Low energy  11/27/2022 9:36:10 AM Go to ED Now Redmond Pulling, RN, [place] - ED  11/28/22 1332 - Pt states she went to [facility] in [place]. Started on Doxycycline & prescription nasal spray & cough syrup. Xray done that on preliminary looked clear. Pt advised to complete course of doxy & if she didn't feel better or felt worse to see provider. Pt verb understanding & has no ques/concerns at this time.

## 2022-11-30 DIAGNOSIS — Z780 Asymptomatic menopausal state: Secondary | ICD-10-CM | POA: Diagnosis not present

## 2022-11-30 DIAGNOSIS — Z01419 Encounter for gynecological examination (general) (routine) without abnormal findings: Secondary | ICD-10-CM | POA: Diagnosis not present

## 2022-11-30 DIAGNOSIS — R200 Anesthesia of skin: Secondary | ICD-10-CM | POA: Diagnosis not present

## 2022-11-30 DIAGNOSIS — Z6828 Body mass index (BMI) 28.0-28.9, adult: Secondary | ICD-10-CM | POA: Diagnosis not present

## 2022-11-30 DIAGNOSIS — Z113 Encounter for screening for infections with a predominantly sexual mode of transmission: Secondary | ICD-10-CM | POA: Diagnosis not present

## 2022-11-30 DIAGNOSIS — M5416 Radiculopathy, lumbar region: Secondary | ICD-10-CM | POA: Diagnosis not present

## 2022-11-30 DIAGNOSIS — Z8619 Personal history of other infectious and parasitic diseases: Secondary | ICD-10-CM | POA: Diagnosis not present

## 2022-12-06 ENCOUNTER — Other Ambulatory Visit: Payer: Self-pay | Admitting: Student

## 2022-12-06 DIAGNOSIS — M5416 Radiculopathy, lumbar region: Secondary | ICD-10-CM

## 2022-12-07 ENCOUNTER — Ambulatory Visit
Admission: RE | Admit: 2022-12-07 | Discharge: 2022-12-07 | Disposition: A | Discharge status: Home / Self Care | Payer: Medicare HMO | Source: Ambulatory Visit | Attending: Student | Admitting: Student

## 2022-12-07 DIAGNOSIS — M5416 Radiculopathy, lumbar region: Secondary | ICD-10-CM

## 2022-12-07 DIAGNOSIS — M545 Low back pain, unspecified: Secondary | ICD-10-CM | POA: Diagnosis not present

## 2022-12-07 DIAGNOSIS — R200 Anesthesia of skin: Secondary | ICD-10-CM | POA: Diagnosis not present

## 2022-12-07 DIAGNOSIS — R202 Paresthesia of skin: Secondary | ICD-10-CM | POA: Diagnosis not present

## 2022-12-13 DIAGNOSIS — F4312 Post-traumatic stress disorder, chronic: Secondary | ICD-10-CM | POA: Diagnosis not present

## 2022-12-13 NOTE — Progress Notes (Unsigned)
ACUTE VISIT Chief Complaint  Patient presents with   body pain   HPI: [name] is a 52 y.o. female with PMHx significant for  bipolar disorder,depression,anxiety,fibromyalgia, HTN, sinus tachycardia,constipation, OSA on CPAP, GERD, vit D def, and HLD here today complaining of generalized pain, soreness of muscles,fatigue, and chest. She was last seen on 10/10/22, when she was seen for headaches that started after head trauma.  She reports still having had headaches, no new associated symptoms, intensity and frequency have improved. Head CT 10/22/22:  1. No acute intracranial abnormality or evidence of traumatic injury. No skull fracture. 2. Paranasal sinus mucosal thickening with small sinus fluid levels,suggesting acute on chronic sinusitis. Recommend trying course of Augmentin, states that symptoms got worse, she ended up going to acute care, where she was started on 2 weeks of Doxycycline. She reports tested negative for COVID-19.  According to pt, she also underwent a chest x-ray, which appeared normal. However, she states that the urgent care provider noted an abnormality in the patient's right lung.  Right-sided chest pain and mid chest pain, soreness like.  Productive cough with gray sputum, no hemoptysis. She quit smoking on 11/20/22.  Chest Pain  This is a new problem. The onset quality is gradual. The problem has been unchanged. The pain is at a severity of 8/10. The pain is severe. The pain does not radiate. Associated symptoms include back pain, a cough, dizziness, headaches (No more than usual), irregular heartbeat, malaise/fatigue, numbness, palpitations and sputum production. Pertinent negatives include no claudication, diaphoresis, exertional chest pressure, fever, hemoptysis, leg pain, lower extremity edema, nausea, near-syncope, orthopnea, PND, shortness of breath, syncope, vomiting or weakness. Nothing relieves the cough. The pain is aggravated by deep breathing.  She has tried nothing for the symptoms. Risk factors include sedentary lifestyle.   Reporting palpitations and HR in the 120's. She has noted irregular heart rate.  Sinus tachycardia and HTN on Propranolol 40 mg bid.  She is concerned about serious cardiac issues, reports family history of heart problems.  She also reports experiencing numbness in her hands, particularly at night, and has been evaluated for possible ulnar nerve issues or carpal tunnel syndrome.Follows with neurosurgeon and according to pt, tests have been ordered.  Fibromyalgia on Lyrica 300 mg bid. Bipolar disorder,depression,and GAD: She follows with psychiatrist. She has an upcoming appointment with a new provider in February.  Vit D def, she is not on vit D supplementation. B12 def,she is not on B12 supplementation.  Lab Results  Component Value Date   [id number] 540 09/25/2018   Review of Systems  Constitutional:  Positive for malaise/fatigue. Negative for diaphoresis and fever.  HENT:  Positive for congestion, postnasal drip and rhinorrhea. Negative for sore throat.   Respiratory:  Positive for cough and sputum production. Negative for hemoptysis and shortness of breath.   Cardiovascular:  Positive for chest pain and palpitations. Negative for orthopnea, claudication, syncope, PND and near-syncope.  Gastrointestinal:  Negative for nausea and vomiting.  Genitourinary:  Negative for decreased urine volume, dysuria and hematuria.  Musculoskeletal:  Positive for arthralgias, back pain and myalgias. Negative for gait problem.  Skin:  Negative for rash.  Neurological:  Positive for dizziness, numbness and headaches (No more than usual). Negative for syncope and weakness.  Psychiatric/Behavioral:  Positive for sleep disturbance. Negative for confusion and hallucinations. The patient is nervous/anxious.   See other pertinent positives and negatives in HPI.  Current Outpatient Medications on File Prior to Visit   Medication Sig Dispense  Refill   acetaminophen (TYLENOL) 500 MG tablet Take 1,000 mg by mouth every 6 (six) hours as needed for moderate pain.     EPINEPHrine 0.3 mg/0.3 mL IJ SOAJ injection Inject 0.3 mg into the muscle as needed for anaphylaxis. 2 each 0   ferrous sulfate 325 (65 FE) MG tablet Take 325 mg by mouth at bedtime.     LINZESS 145 MCG CAPS capsule TAKE 1 CAPSULE EVERY DAY AS NEEDED 90 capsule 0   Multiple Vitamins-Minerals (MULTIVITAMIN WITH MINERALS) tablet Take 1 tablet by mouth at bedtime.     omeprazole (PRILOSEC) 40 MG capsule TAKE 1 CAPSULE EVERY DAY 90 capsule 1   pregabalin (LYRICA) 300 MG capsule TAKE 1 CAPSULE TWICE DAILY 60 capsule 3   propranolol (INDERAL) 40 MG tablet Take 1 tablet (40 mg total) by mouth 2 (two) times daily. (Patient taking differently: Take 40 mg by mouth daily.) 180 tablet 2   No current facility-administered medications on file prior to visit.    Past Medical History:  Diagnosis Date   Anxiety    Bipolar disorder ([place])    Chronic kidney disease    kidney infections   Depression    Fibromyalgia    GERD (gastroesophageal reflux disease)    History of cervical dysplasia    History of exercise intolerance    normal ETT 10-24-2016   History of kidney stones    Hyperlipidemia    Hypertension    Migraines    Osteoarthritis    PCOS (polycystic ovarian syndrome)    Pneumonia 2018   Rash    in area of eswl 04-23-2017   Right ureteral calculus    Sleep apnea    Tachycardia cardiologist-  dr harding   controlled w/ metoprolol   Allergies  Allergen Reactions   Aspirin Shortness Of Breath and Other (See Comments)    Angiodema   Bee Venom Hives    Face swelling and chest pain   Diclofenac Anaphylaxis   Naproxen Swelling    Facial swelling   Nsaids Anaphylaxis    Swelling of eyes mouth and throat difficulty breathing   Prednisone     Worsened depression, suicidal ideation    Terbinafine And Related     Worsened depression, suicidal  ideation   Social History   Socioeconomic History   Marital status: Legally Separated    Spouse name: Not on file   Number of children: 2   Years of education: Not on file   Highest education level: Not on file  Occupational History   Occupation: Unemployed  Tobacco Use   Smoking status: Some Days    Packs/day: 0.50    Years: 25.00    Total pack years: 12.50    Types: Cigarettes   Smokeless tobacco: Never   Tobacco comments:    6-7 cig. daily  Vaping Use   Vaping Use: Never used  Substance and Sexual Activity   Alcohol use: No   Drug use: No    Types: Cocaine    Comment: last cocaine use 2014   Sexual activity: Not Currently    Partners: Male    Birth control/protection: None  Other Topics Concern   Not on file  Social History Narrative   She lives at home with her spouse. She does not exercise.   She currently denies recent recreational drug use.   Social Determinants of Health   Financial Resource Strain: Low Risk  (08/01/2022)   Overall Financial Resource Strain (CARDIA)    Difficulty  of Paying Living Expenses: Not hard at all  Food Insecurity: No Food Insecurity (08/28/2022)   Hunger Vital Sign    Worried About Running Out of Food in the Last Year: Never true    Ran Out of Food in the Last Year: Never true  Transportation Needs: No Transportation Needs (08/28/2022)   PRAPARE - Hydrologist (Medical): No    Lack of Transportation (Non-Medical): No  Physical Activity: Inactive (08/01/2022)   Exercise Vital Sign    Days of Exercise per Week: 0 days    Minutes of Exercise per Session: 0 min  Stress: Stress Concern Present (08/01/2022)   [place]    Feeling of Stress : To some extent  Social Connections: Socially Isolated (06/01/2021)   Social Connection and Isolation Panel [NHANES]    Frequency of Communication with Friends and Family: Once a week    Frequency of  Social Gatherings with Friends and Family: Never    Attends Religious Services: Never    Marine scientist or Organizations: No    Attends Archivist Meetings: Never    Marital Status: Separated   Vitals:   12/15/22 0837  BP: 128/80  Pulse: 79  Resp: 16  Temp: 98.5 F (36.9 C)  SpO2: 98%   Body mass index is 29.01 kg/m.  Physical Exam Vitals and nursing note reviewed.  Constitutional:      General: She is not in acute distress.    Appearance: She is well-developed.  HENT:     Head: Normocephalic and atraumatic.     Mouth/Throat:     Mouth: Mucous membranes are moist.     Pharynx: Oropharynx is clear.  Eyes:     Conjunctiva/sclera: Conjunctivae normal.  Cardiovascular:     Rate and Rhythm: Normal rate and regular rhythm.     Pulses:          Posterior tibial pulses are 2+ on the right side and 2+ on the left side.     Heart sounds: No murmur heard. Pulmonary:     Effort: Pulmonary effort is normal. No respiratory distress.     Breath sounds: Normal breath sounds.  Chest:     Comments: Pain with palpation of chest wall and deep breathing. Abdominal:     Palpations: Abdomen is soft. There is no hepatomegaly or mass.     Tenderness: There is no abdominal tenderness.  Musculoskeletal:     Comments: Tender trigger points on chest wall, left upper and lower extremities.  Lymphadenopathy:     Cervical: No cervical adenopathy.  Skin:    General: Skin is warm.     Findings: No erythema or rash.  Neurological:     General: No focal deficit present.     Mental Status: She is alert and oriented to person, place, and time.     Cranial Nerves: No cranial nerve deficit.     Gait: Gait normal.  Psychiatric:        Mood and Affect: Mood is anxious.   ASSESSMENT AND PLAN:  Ms. Gianah was seen today for body pain.  Diagnoses and all orders for this visit: Lab Results  Component Value Date   WBC 7.7 12/15/2022   HGB 13.5 12/15/2022   HCT 39.7 12/15/2022    MCV 84.9 12/15/2022   PLT 305.0 12/15/2022   Lab Results  Component Value Date   CREATININE 0.71 12/15/2022   BUN 16 12/15/2022  NA 137 12/15/2022   K 4.2 12/15/2022   CL 102 12/15/2022   CO2 28 12/15/2022   Lab Results  Component Value Date   CKTOTAL 37 12/15/2022   CKMB 1.0 01/07/2010   TROPONINI <0.01        NO INDICATION OF MYOCARDIAL INJURY. 04/03/2009   Myalgia Assessment & Plan: We discussed possible etiologies. She has hx of chronic pain and fibromyalgia. BMP,CBC,and CK will be obtained today.  Orders: -     CBC; Future -     Basic metabolic panel; Future -     CK; Future  Fibromyalgia Assessment & Plan: Having generalized myalgias. Continue adequate sleep hygiene and Lyrica 300 mg bid. Recommend low impact exercise.  Palpitations Assessment & Plan: She has been evaluated by cardiologist for this problem. Reporting irregular HR. Continue Propranolol 40 mg bid. Cardiology referral placed.  Orders: -     Ambulatory referral to Cardiology  Persistent cough Lung auscultation negative. I do not have records of recent [facility] visit, where she reported having CXR done. Completed abx treatment with Augmentin and Doxycycline. Possible causes allergies,former smoker (?COPD),and GI.  -     DG Chest 2 View; Future  B12 deficiency Assessment & Plan: Not on B12 supplementation. Further recommendations according to B12 result.  Orders: -     Vitamin B12; Future  Vitamin D deficiency, unspecified Assessment & Plan: She is not on Vit D supplementation. Further recommendations according to 25 OH vit D result.  Orders: -     VITAMIN D 25 Hydroxy (Vit-D Deficiency, Fractures); Future  Chest pain with low risk for cardiac etiology Assessment & Plan: We discussed differential Dx's, including GI and fibromyalgia. She has seen cardiologist in the past for this problem. Tried to reassure her, the probability of CP being caused by a serious cardiac problem is  low. CXR ordered today. Instructed about warning signs.  Orders: -     DG Chest 2 View; Future  Borderline personality disorder in adult [facility]) Assessment & Plan: Has appt with psychiatrist in 12/2022.  MDD (major depressive disorder), recurrent severe, without psychosis ([place]) Assessment & Plan: Follows with psychiatrist.  Return if symptoms worsen or fail to improve, for keep next appointment.  Cambryn Charters G. Martinique, MD  [facility] [place] office.

## 2022-12-15 ENCOUNTER — Ambulatory Visit (INDEPENDENT_AMBULATORY_CARE_PROVIDER_SITE_OTHER)
Admission: RE | Admit: 2022-12-15 | Discharge: 2022-12-15 | Disposition: A | Discharge status: Home / Self Care | Payer: Medicare HMO | Source: Ambulatory Visit | Attending: Family Medicine | Admitting: Family Medicine

## 2022-12-15 ENCOUNTER — Ambulatory Visit (INDEPENDENT_AMBULATORY_CARE_PROVIDER_SITE_OTHER): Payer: Medicare HMO | Admitting: Family Medicine

## 2022-12-15 ENCOUNTER — Encounter: Payer: Self-pay | Admitting: Family Medicine

## 2022-12-15 VITALS — BP 128/80 | HR 79 | Temp 98.5°F | Resp 16 | Ht 67.0 in | Wt 185.2 lb

## 2022-12-15 DIAGNOSIS — R002 Palpitations: Secondary | ICD-10-CM | POA: Diagnosis not present

## 2022-12-15 DIAGNOSIS — F603 Borderline personality disorder: Secondary | ICD-10-CM

## 2022-12-15 DIAGNOSIS — M797 Fibromyalgia: Secondary | ICD-10-CM

## 2022-12-15 DIAGNOSIS — R053 Chronic cough: Secondary | ICD-10-CM | POA: Diagnosis not present

## 2022-12-15 DIAGNOSIS — M791 Myalgia, unspecified site: Secondary | ICD-10-CM | POA: Diagnosis not present

## 2022-12-15 DIAGNOSIS — R059 Cough, unspecified: Secondary | ICD-10-CM | POA: Diagnosis not present

## 2022-12-15 DIAGNOSIS — E538 Deficiency of other specified B group vitamins: Secondary | ICD-10-CM | POA: Diagnosis not present

## 2022-12-15 DIAGNOSIS — F332 Major depressive disorder, recurrent severe without psychotic features: Secondary | ICD-10-CM | POA: Diagnosis not present

## 2022-12-15 DIAGNOSIS — E559 Vitamin D deficiency, unspecified: Secondary | ICD-10-CM | POA: Diagnosis not present

## 2022-12-15 DIAGNOSIS — R079 Chest pain, unspecified: Secondary | ICD-10-CM | POA: Diagnosis not present

## 2022-12-15 LAB — BASIC METABOLIC PANEL
BUN: 16 mg/dL (ref 6–23)
CO2: 28 mEq/L (ref 19–32)
Calcium: 9.2 mg/dL (ref 8.4–10.5)
Chloride: 102 mEq/L (ref 96–112)
Creatinine, Ser: 0.71 mg/dL (ref 0.40–1.20)
GFR: 99.98 mL/min (ref >60.00)
Glucose, Bld: 106 mg/dL — ABNORMAL HIGH (ref 70–99)
Potassium: 4.2 mEq/L (ref 3.5–5.1)
Sodium: 137 mEq/L (ref 135–145)

## 2022-12-15 LAB — CBC
HCT: 39.7 % (ref 36.0–46.0)
Hemoglobin: 13.5 g/dL (ref 12.0–15.0)
MCHC: 33.9 g/dL (ref 30.0–36.0)
MCV: 84.9 fl (ref 78.0–100.0)
Platelets: 305 10*3/uL (ref 150.0–400.0)
RBC: 4.68 Mil/uL (ref 3.87–5.11)
RDW: 13.1 % (ref 11.5–15.5)
WBC: 7.7 10*3/uL (ref 4.0–10.5)

## 2022-12-15 LAB — VITAMIN D 25 HYDROXY (VIT D DEFICIENCY, FRACTURES): VITD: 14.11 ng/mL — ABNORMAL LOW (ref 30.00–100.00)

## 2022-12-15 LAB — CK: Total CK: 37 U/L (ref 7–177)

## 2022-12-15 LAB — VITAMIN B12: Vitamin B-12: 217 pg/mL (ref 211–911)

## 2022-12-15 NOTE — Patient Instructions (Addendum)
A few things to remember from today's visit:  Fibromyalgia  Myalgia - Plan: CBC, Basic metabolic panel, CK  Palpitations - Plan: Ambulatory referral to Cardiology  Chest pain, unspecified type - Plan: DG Chest 2 View  Persistent cough - Plan: DG Chest 2 View  B12 deficiency - Plan: Vitamin B12  Vitamin D deficiency, unspecified - Plan: VITAMIN D 25 Hydroxy (Vit-D Deficiency, Fractures)  I think muscle aches and chest pain are related to fibromyalgia. Appt with cardiologist weill be arranged. In regard to hot flashes and breast tenderness continue following with gynecologist. Continue following with neurosurgeon for back pain and numbness. If pain is not back to baseline, we could consider referral to pain management. Low impact exercise and good sleep hygiene are important.  If you need refills for medications you take chronically, please call your pharmacy. Do not use My Chart to request refills or for acute issues that need immediate attention. If you send a my chart message, it may take a few days to be addressed, specially if I am not in the office.  Please be sure medication list is accurate. If a new problem present, please set up appointment sooner than planned today.

## 2022-12-16 NOTE — Assessment & Plan Note (Signed)
Has appt with psychiatrist in 12/2022.

## 2022-12-16 NOTE — Assessment & Plan Note (Addendum)
We discussed differential Dx's, including GI and fibromyalgia. She has seen cardiologist in the past for this problem. Tried to reassure her, the probability of CP being caused by a serious cardiac problem is low. CXR ordered today. Instructed about warning signs.

## 2022-12-16 NOTE — Assessment & Plan Note (Signed)
We discussed possible etiologies. She has hx of chronic pain and fibromyalgia. BMP,CBC,and CK will be obtained today.

## 2022-12-16 NOTE — Assessment & Plan Note (Signed)
Follows with psychiatrist.

## 2022-12-16 NOTE — Assessment & Plan Note (Signed)
She is not on Vit D supplementation. Further recommendations according to 25 OH vit D result.

## 2022-12-16 NOTE — Assessment & Plan Note (Signed)
She has been evaluated by cardiologist for this problem. Reporting irregular HR. Continue Propranolol 40 mg bid. Cardiology referral placed.

## 2022-12-16 NOTE — Assessment & Plan Note (Signed)
Having generalized myalgias. Continue adequate sleep hygiene and Lyrica 300 mg bid. Recommend low impact exercise.

## 2022-12-16 NOTE — Assessment & Plan Note (Signed)
Not on B12 supplementation. Further recommendations according to B12 result.

## 2022-12-27 DIAGNOSIS — F4312 Post-traumatic stress disorder, chronic: Secondary | ICD-10-CM | POA: Diagnosis not present

## 2022-12-28 DIAGNOSIS — M797 Fibromyalgia: Secondary | ICD-10-CM | POA: Diagnosis not present

## 2022-12-28 DIAGNOSIS — K219 Gastro-esophageal reflux disease without esophagitis: Secondary | ICD-10-CM | POA: Diagnosis not present

## 2022-12-28 DIAGNOSIS — Z6829 Body mass index (BMI) 29.0-29.9, adult: Secondary | ICD-10-CM | POA: Diagnosis not present

## 2022-12-28 DIAGNOSIS — Z13220 Encounter for screening for lipoid disorders: Secondary | ICD-10-CM | POA: Diagnosis not present

## 2022-12-28 DIAGNOSIS — N951 Menopausal and female climacteric states: Secondary | ICD-10-CM | POA: Diagnosis not present

## 2022-12-28 DIAGNOSIS — I10 Essential (primary) hypertension: Secondary | ICD-10-CM | POA: Diagnosis not present

## 2022-12-28 DIAGNOSIS — R5383 Other fatigue: Secondary | ICD-10-CM | POA: Diagnosis not present

## 2022-12-28 DIAGNOSIS — R7309 Other abnormal glucose: Secondary | ICD-10-CM | POA: Diagnosis not present

## 2022-12-28 DIAGNOSIS — E559 Vitamin D deficiency, unspecified: Secondary | ICD-10-CM | POA: Diagnosis not present

## 2022-12-29 LAB — COMPREHENSIVE METABOLIC PANEL
Albumin: 3.9 (ref 3.5–5.0)
Calcium: 8.8 (ref 8.7–10.7)

## 2022-12-29 LAB — LIPID PANEL
Cholesterol: 172 (ref 0–200)
HDL: 61 (ref 35–70)
LDL Cholesterol: 98
Triglycerides: 65 (ref 40–160)

## 2022-12-29 LAB — VITAMIN D 25 HYDROXY (VIT D DEFICIENCY, FRACTURES): Vit D, 25-Hydroxy: 12.6

## 2022-12-29 LAB — IRON,TIBC AND FERRITIN PANEL
Ferritin: 51
Iron: 50

## 2022-12-29 LAB — HEPATIC FUNCTION PANEL
ALT: 15 U/L (ref 7–35)
AST: 14 (ref 13–35)
Alkaline Phosphatase: 66 (ref 25–125)
Bilirubin, Total: 0.4

## 2022-12-29 LAB — CBC AND DIFFERENTIAL
HCT: 40 (ref 36–46)
Hemoglobin: 13.2 (ref 12.0–16.0)
Platelets: 301 10*3/uL (ref 150–400)
WBC: 7.6

## 2022-12-29 LAB — BASIC METABOLIC PANEL
BUN: 17 (ref 4–21)
CO2: 25 — AB (ref 13–22)
Chloride: 105 (ref 99–108)
Creatinine: 0.8 (ref 0.5–1.1)
Glucose: 103
Potassium: 4.3 mEq/L (ref 3.5–5.1)
Sodium: 138 (ref 137–147)

## 2022-12-29 LAB — TSH: TSH: 1.7 (ref 0.41–5.90)

## 2022-12-29 LAB — HEMOGLOBIN A1C: Hemoglobin A1C: 5

## 2022-12-29 LAB — CBC: RBC: 4.56 (ref 3.87–5.11)

## 2023-01-01 ENCOUNTER — Ambulatory Visit (INDEPENDENT_AMBULATORY_CARE_PROVIDER_SITE_OTHER): Payer: Medicare HMO

## 2023-01-01 ENCOUNTER — Encounter: Payer: Self-pay | Admitting: Family Medicine

## 2023-01-01 DIAGNOSIS — E538 Deficiency of other specified B group vitamins: Secondary | ICD-10-CM | POA: Diagnosis not present

## 2023-01-01 DIAGNOSIS — M2550 Pain in unspecified joint: Secondary | ICD-10-CM | POA: Diagnosis not present

## 2023-01-01 DIAGNOSIS — I10 Essential (primary) hypertension: Secondary | ICD-10-CM | POA: Diagnosis not present

## 2023-01-01 DIAGNOSIS — Z1331 Encounter for screening for depression: Secondary | ICD-10-CM | POA: Diagnosis not present

## 2023-01-01 DIAGNOSIS — E559 Vitamin D deficiency, unspecified: Secondary | ICD-10-CM | POA: Diagnosis not present

## 2023-01-01 DIAGNOSIS — Z6829 Body mass index (BMI) 29.0-29.9, adult: Secondary | ICD-10-CM | POA: Diagnosis not present

## 2023-01-01 DIAGNOSIS — N951 Menopausal and female climacteric states: Secondary | ICD-10-CM | POA: Diagnosis not present

## 2023-01-01 DIAGNOSIS — R5383 Other fatigue: Secondary | ICD-10-CM | POA: Diagnosis not present

## 2023-01-01 DIAGNOSIS — R635 Abnormal weight gain: Secondary | ICD-10-CM | POA: Diagnosis not present

## 2023-01-01 DIAGNOSIS — Z1339 Encounter for screening examination for other mental health and behavioral disorders: Secondary | ICD-10-CM | POA: Diagnosis not present

## 2023-01-01 MED ORDER — CYANOCOBALAMIN 1000 MCG/ML IJ SOLN
1000.0000 ug | Freq: Once | INTRAMUSCULAR | Status: AC
  Administered 2023-01-01: 1000 ug via INTRAMUSCULAR

## 2023-01-01 NOTE — Progress Notes (Signed)
Pt here for monthly B12 injection per Dr. Martinique.  B12 1076mg given IM, and pt tolerated injection well.

## 2023-01-02 DIAGNOSIS — Z6829 Body mass index (BMI) 29.0-29.9, adult: Secondary | ICD-10-CM | POA: Diagnosis not present

## 2023-01-02 DIAGNOSIS — M5440 Lumbago with sciatica, unspecified side: Secondary | ICD-10-CM | POA: Diagnosis not present

## 2023-01-02 DIAGNOSIS — R200 Anesthesia of skin: Secondary | ICD-10-CM | POA: Diagnosis not present

## 2023-01-04 NOTE — Progress Notes (Signed)
Cardiology Office Note:    Date:  01/10/2023   ID:  [name] [name], DOB 02/15/1971, MRN [id number]  PCP:  Martinique, [name], MD   [place] Providers Cardiologist:  Lenna Sciara, MD Referring MD: Martinique, [name], MD   Chief Complaint/Reason for Referral:  Palpitations  ASSESSMENT:    1. Palpitations   2. Precordial pain     PLAN:    In order of problems listed above: 1.  Palpitations: We will obtain a monitor and echocardiogram to evaluate further. 2.  Chest pain: There are definitely some typical and atypical aspects to her chest pain.  We will obtain a coronary CTA and echocardiogram to evaluate further.  If the patient has mild obstructive coronary artery disease, they will require a statin (with goal LDL < 70) and plavix (due to ASA allergy), if they have high-grade disease we will need to consider optimal medical therapy and if symptoms are refractory to medical therapy, then a cardiac catheterization with possible PCI will be pursued to alleviate symptoms.  If they have high risk disease we will proceed directly to cardiac catheterization.                Dispo:  Return if symptoms worsen or fail to improve.      Medication Adjustments/Labs and Tests Ordered: Current medicines are reviewed at length with the patient today.  Concerns regarding medicines are outlined above.  The following changes have been made:  no change   Labs/tests ordered: Orders Placed This Encounter  Procedures   CT CORONARY MORPH W/CTA COR W/SCORE W/CA W/CM &/OR WO/CM   LONG TERM MONITOR (3-14 DAYS)   ECHOCARDIOGRAM COMPLETE    Medication Changes: Meds ordered this encounter  Medications   metoprolol tartrate (LOPRESSOR) 100 MG tablet    Sig: Take 1 tablet (100 mg total) by mouth once for 1 dose. Take 90-120 minutes prior to scan.    Dispense:  1 tablet    Refill:  0     Current medicines are reviewed at length with the patient today.  The patient does not have concerns  regarding medicines.   History of Present Illness:    FOCUSED PROBLEM LIST:   Hypertension OSA on CPAP Fibromyalgia Tobacco abuse Anaphylaxis in response to ASA  The patient is a 52 y.o. female with the indicated medical history here for recommendations regarding palpitations.  The patient was seen by her primary care provider recently.  She noted an irregular heartbeat and palpitations.  For this reason she was referred for further recommendations.  Labs including a TSH were within normal limits.  The patient tells me that she gets palpitations every day.  They can last for few minutes.  They do not seem to be associated with stress.  They are associated with chest pain.  She occasionally gets chest pain for which she exerts herself as well.  The chest pain will abate when she rests.  She denies any presyncope, syncope, bleeding or bruising, paroxysmal nocturnal dyspnea, orthopnea.  She fortunately has not required any emergency room visits or hospitalizations recently.          Current Medications: Current Meds  Medication Sig   acetaminophen (TYLENOL) 500 MG tablet Take 1,000 mg by mouth every 6 (six) hours as needed for moderate pain.   Cholecalciferol (VITAMIN D) 50 MCG (2000 UT) tablet Take 2,000 Units by mouth daily.   EPINEPHrine 0.3 mg/0.3 mL IJ SOAJ injection Inject 0.3 mg into the muscle as needed  for anaphylaxis.   ferrous sulfate 325 (65 FE) MG tablet Take 325 mg by mouth at bedtime.   LINZESS 145 MCG CAPS capsule TAKE 1 CAPSULE EVERY DAY AS NEEDED   metoprolol tartrate (LOPRESSOR) 100 MG tablet Take 1 tablet (100 mg total) by mouth once for 1 dose. Take 90-120 minutes prior to scan.   Multiple Vitamins-Minerals (MULTIVITAMIN WITH MINERALS) tablet Take 1 tablet by mouth at bedtime.   omeprazole (PRILOSEC) 40 MG capsule TAKE 1 CAPSULE EVERY DAY   pregabalin (LYRICA) 300 MG capsule TAKE 1 CAPSULE TWICE DAILY   propranolol (INDERAL) 40 MG tablet Take 1 tablet (40 mg total)  by mouth 2 (two) times daily. (Patient taking differently: Take 40 mg by mouth daily.)     Allergies:    Aspirin, Bee venom, Diclofenac, Naproxen, Nsaids, Prednisone, and Terbinafine and related   Social History:   Social History   Tobacco Use   Smoking status: Some Days    Packs/day: 0.50    Years: 25.00    Total pack years: 12.50    Types: Cigarettes   Smokeless tobacco: Never   Tobacco comments:    6-7 cig. daily  Vaping Use   Vaping Use: Never used  Substance Use Topics   Alcohol use: No   Drug use: No    Types: Cocaine    Comment: last cocaine use 2014     Family Hx: Family History  Problem Relation Age of Onset   Diabetes Father    Hypertension Father    Arthritis Father    Depression Father    Hearing loss Father    Diabetes Mother    Hypertension Mother    Arthritis Mother    Depression Mother    Asthma Paternal Grandmother    Kidney disease Paternal Grandmother    Arthritis Paternal Grandmother    Cancer Paternal Grandmother    COPD Paternal Grandmother    Hearing loss Paternal Grandmother    Heart disease Paternal Grandmother    Hypertension Paternal Grandmother    Hyperlipidemia Paternal Grandmother    Stroke Paternal Grandmother    Heart attack Paternal Grandmother    Depression Sister    Emphysema Other    Tongue cancer Other    Coronary artery disease Other    COPD Son    Drug abuse Son    Arthritis Maternal Grandmother    COPD Maternal Grandmother    Depression Maternal Grandmother    Heart disease Maternal Grandmother    Early death Maternal Grandfather    Heart attack Maternal Grandfather    COPD Paternal Grandfather    Heart attack Paternal Grandfather    Breast cancer Neg Hx      Review of Systems:   Please see the history of present illness.    All other systems reviewed and are negative.     EKGs/Labs/Other Test Reviewed:    EKG:  EKG performed July 2023 that I personally reviewed demonstrates NSR  Prior CV  studies:  2017 ETT: Blood pressure demonstrated a normal response to exercise. There was no ST segment deviation noted during stress. Normal exercise treadmill test with no EKG criterial of ischemia at an adequate workload of 10.7 mets and 85% MPHR. This is a low risk study.  Other studies Reviewed: Review of the additional studies/records demonstrates: No imaging studies demonstrating aortic atherosclerosis or coronary artery calcification  Recent Labs: 12/29/2022: ALT 15; BUN 17; Creatinine 0.8; Hemoglobin 13.2; Platelets 301; Potassium 4.3; Sodium 138; TSH 1.70  Recent Lipid Panel Lab Results  Component Value Date/Time   CHOL 172 12/29/2022 12:00 AM   TRIG 65 12/29/2022 12:00 AM   HDL 61 12/29/2022 12:00 AM   LDLCALC 98 12/29/2022 12:00 AM    Risk Assessment/Calculations:                Physical Exam:    VS:  BP 120/78   Pulse 80   Ht 5' 7"$  (1.702 m)   Wt 195 lb 3.2 oz (88.5 kg)   LMP 04/30/2018   SpO2 99%   BMI 30.57 kg/m    Wt Readings from Last 3 Encounters:  01/10/23 195 lb 3.2 oz (88.5 kg)  12/15/22 185 lb 4 oz (84 kg)  10/10/22 169 lb 2 oz (76.7 kg)    GENERAL:  No apparent distress, AOx3 HEENT:  No carotid bruits, +2 carotid impulses, no scleral icterus CAR: RRR no murmurs, gallops, rubs, or thrills RES:  Clear to auscultation bilaterally ABD:  Soft, nontender, nondistended, positive bowel sounds x 4 VASC:  +2 radial pulses, +2 carotid pulses, palpable pedal pulses NEURO:  CN 2-12 grossly intact; motor and sensory grossly intact PSYCH:  No active depression or anxiety EXT:  No edema, ecchymosis, or cyanosis  Signed, Early Osmond, MD  01/10/2023 9:53 AM    [address], [place], [place]  [address] Phone: [phone]; Fax: [phone]   Note:  This document was prepared using Dragon voice recognition software and may include unintentional dictation errors.

## 2023-01-08 DIAGNOSIS — R7309 Other abnormal glucose: Secondary | ICD-10-CM | POA: Diagnosis not present

## 2023-01-08 DIAGNOSIS — Z6830 Body mass index (BMI) 30.0-30.9, adult: Secondary | ICD-10-CM | POA: Diagnosis not present

## 2023-01-10 ENCOUNTER — Encounter: Payer: Self-pay | Admitting: Internal Medicine

## 2023-01-10 ENCOUNTER — Ambulatory Visit (INDEPENDENT_AMBULATORY_CARE_PROVIDER_SITE_OTHER): Payer: Medicare HMO

## 2023-01-10 ENCOUNTER — Ambulatory Visit: Payer: Medicare HMO | Attending: Internal Medicine | Admitting: Internal Medicine

## 2023-01-10 VITALS — BP 120/78 | HR 80 | Ht 67.0 in | Wt 195.2 lb

## 2023-01-10 DIAGNOSIS — R002 Palpitations: Secondary | ICD-10-CM

## 2023-01-10 DIAGNOSIS — F4312 Post-traumatic stress disorder, chronic: Secondary | ICD-10-CM | POA: Diagnosis not present

## 2023-01-10 DIAGNOSIS — R072 Precordial pain: Secondary | ICD-10-CM

## 2023-01-10 MED ORDER — METOPROLOL TARTRATE 100 MG PO TABS: 100.0000 mg | ORAL_TABLET | Freq: Once | ORAL | 0 refills | Status: DC

## 2023-01-10 NOTE — Progress Notes (Unsigned)
Enrolled for [employer] to mail a ZIO XT long term holter monitor to the patients address on file.  

## 2023-01-10 NOTE — Patient Instructions (Addendum)
Medication Instructions:  No changes *If you need a refill on your cardiac medications before your next appointment, please call your pharmacy*   Lab Work: none If you have labs (blood work) drawn today and your tests are completely normal, you will receive your results only by: [place] (if you have MyChart) OR A paper copy in the mail If you have any lab test that is abnormal or we need to change your treatment, we will call you to review the results.   Testing/Procedures: Your physician has requested that you have an echocardiogram. Echocardiography is a painless test that uses sound waves to create images of your heart. It provides your doctor with information about the size and shape of your heart and how well your heart's chambers and valves are working. This procedure takes approximately one hour. There are no restrictions for this procedure. Please do NOT wear cologne, perfume, aftershave, or lotions (deodorant is allowed). Please arrive 15 minutes prior to your appointment time.  Cardiac CTA - see instructions below  Zio Monitor - 3 days: ZIO XT- Long Term Monitor Instructions  Your physician has requested you wear a ZIO patch monitor for 3 days.  This is a single patch monitor. [employer] supplies one patch monitor per enrollment. Additional stickers are not available. Please do not apply patch if you will be having a Nuclear Stress Test,  Echocardiogram, Cardiac CT, MRI, or Chest Xray during the period you would be wearing the  monitor. The patch cannot be worn during these tests. You cannot remove and re-apply the  ZIO XT patch monitor.  Your ZIO patch monitor will be mailed 3 day USPS to your address on file. It may take 3-5 days  to receive your monitor after you have been enrolled.  Once you have received your monitor, please review the enclosed instructions. Your monitor  has already been registered assigning a specific monitor serial # to you.  Billing and  Patient Assistance Program Information  We have supplied [employer] with any of your insurance information on file for billing purposes. [employer] offers a sliding scale Patient Assistance Program for patients that do not have  insurance, or whose insurance does not completely cover the cost of the ZIO monitor.  You must apply for the Patient Assistance Program to qualify for this discounted rate.  To apply, please call [employer] at [phone], select option 4, select option 2, ask to apply for  Patient Assistance Program. Theodore Demark will ask your household income, and how many people  are in your household. They will quote your out-of-pocket cost based on that information.  [employer] will also be able to set up a 51-month interest-free payment plan if needed.  Applying the monitor   Shave hair from upper left chest.  Hold abrader disc by orange tab. Rub abrader in 40 strokes over the upper left chest as  indicated in your monitor instructions.  Clean area with 4 enclosed alcohol pads. Let dry.  Apply patch as indicated in monitor instructions. Patch will be placed under collarbone on left  side of chest with arrow pointing upward.  Rub patch adhesive wings for 2 minutes. Remove white label marked "1". Remove the white  label marked "2". Rub patch adhesive wings for 2 additional minutes.  While looking in a mirror, press and release button in center of patch. A small green light will  flash 3-4 times. This will be your only indicator that the monitor has been turned on.  Do not shower  for the first 24 hours. You may shower after the first 24 hours.  Press the button if you feel a symptom. You will hear a small click. Record Date, Time and  Symptom in the Patient Logbook.  When you are ready to remove the patch, follow instructions on the last 2 pages of Patient  Logbook. Stick patch monitor onto the last page of Patient Logbook.  Place Patient Logbook in the blue and white box. Use locking tab on  box and tape box closed  securely. The blue and white box has prepaid postage on it. Please place it in the mailbox as  soon as possible. Your physician should have your test results approximately 7 days after the  monitor has been mailed back to [facility].  Call [facility] at [phone] if you have questions regarding  your ZIO XT patch monitor. Call them immediately if you see an orange light blinking on your  monitor.  If your monitor falls off in less than 4 days, contact our Monitor department at [phone].  If your monitor becomes loose or falls off after 4 days call [employer] at [phone] for  suggestions on securing your monitor    Follow-Up: As needed  Other Instructions   Your cardiac CT will be scheduled at one of the below locations:   [facility] [address] [place], [place] [address] [phone]  [facility] [address] [place], [place] [address] [phone]  [facility] [place], [place] [address] [phone]  If scheduled at [facility], please arrive at the [facility] and Children's Entrance (Entrance C2) of [facility] 30 minutes prior to test start time. You can use the FREE valet parking offered at entrance C (encouraged to control the heart rate for the test)  Proceed to the [facility] Radiology Department (first floor) to check-in and test prep.  All radiology patients and guests should use entrance C2 at [facility], accessed from [facility], even though the hospital's physical address listed is [address]    If scheduled at [facility] or [facility], please arrive 15 mins early for check-in and test prep.   Please follow these instructions carefully (unless otherwise directed):   On the Night Before  the Test: Be sure to Drink plenty of water. Do not consume any caffeinated/decaffeinated beverages or chocolate 12 hours prior to your test. Do not take any antihistamines 12 hours prior to your test.  On the Day of the Test: Drink plenty of water until 1 hour prior to the test. Do not eat any food 1 hour prior to test. You may take your regular medications prior to the test.  Take metoprolol (Lopressor) two hours prior to test. FEMALES- please wear underwire-free bra if available, avoid dresses & tight clothing       After the Test: Drink plenty of water. After receiving IV contrast, you may experience a mild flushed feeling. This is normal. On occasion, you may experience a mild rash up to 24 hours after the test. This is not dangerous. If this occurs, you can take Benadryl 25 mg and increase your fluid intake. If you experience trouble breathing, this can be serious. If it is severe call 911 IMMEDIATELY. If it is mild, please call our office. If you take any of these medications: Glipizide/Metformin, Avandament, Glucavance, please  do not take 48 hours after completing test unless otherwise instructed.  We will call to schedule your test 2-4 weeks out understanding that some insurance companies will need an authorization prior to the service being performed.   For non-scheduling related questions, please contact the cardiac imaging nurse navigator should you have any questions/concerns: Marchia Bond, Cardiac Imaging Nurse Navigator Gordy Clement, Cardiac Imaging Nurse Navigator [facility] Direct Office Dial: [phone]   For scheduling needs, including cancellations and rescheduling, please call [place], [phone].

## 2023-01-13 DIAGNOSIS — R002 Palpitations: Secondary | ICD-10-CM | POA: Diagnosis not present

## 2023-01-13 DIAGNOSIS — R072 Precordial pain: Secondary | ICD-10-CM

## 2023-01-16 DIAGNOSIS — M5416 Radiculopathy, lumbar region: Secondary | ICD-10-CM | POA: Diagnosis not present

## 2023-01-17 ENCOUNTER — Telehealth (HOSPITAL_COMMUNITY): Payer: Self-pay | Admitting: *Deleted

## 2023-01-17 NOTE — Telephone Encounter (Signed)
Reaching out to patient to offer assistance regarding upcoming cardiac imaging study; pt verbalizes understanding of appt date/time, parking situation and where to check in, pre-test NPO status and medications ordered, and verified current allergies; name and call back number provided for further questions should they arise  Gordy Clement RN Navigator Cardiac Imaging [facility] [phone] office [phone] cell  Patient to take '100mg'$  metoprolol tartrate two hours prior to her cardiac CT scan. She is aware to arrive at 7:30am.

## 2023-01-18 ENCOUNTER — Ambulatory Visit (HOSPITAL_COMMUNITY)
Admission: RE | Admit: 2023-01-18 | Discharge: 2023-01-18 | Disposition: A | Discharge status: Home / Self Care | Payer: Medicare HMO | Source: Ambulatory Visit | Attending: Internal Medicine | Admitting: Internal Medicine

## 2023-01-18 DIAGNOSIS — R072 Precordial pain: Secondary | ICD-10-CM | POA: Diagnosis not present

## 2023-01-18 MED ORDER — NITROGLYCERIN 0.4 MG SL SUBL
0.8000 mg | SUBLINGUAL_TABLET | Freq: Once | SUBLINGUAL | Status: AC
  Administered 2023-01-18: 0.8 mg via SUBLINGUAL

## 2023-01-18 MED ORDER — NITROGLYCERIN 0.4 MG SL SUBL
SUBLINGUAL_TABLET | SUBLINGUAL | Status: AC
  Filled 2023-01-18: qty 2

## 2023-01-18 MED ORDER — IOHEXOL 350 MG/ML SOLN
95.0000 mL | Freq: Once | INTRAVENOUS | Status: AC | PRN
  Administered 2023-01-18: 95 mL via INTRAVENOUS

## 2023-01-19 DIAGNOSIS — M545 Low back pain, unspecified: Secondary | ICD-10-CM | POA: Diagnosis not present

## 2023-01-22 ENCOUNTER — Ambulatory Visit: Payer: Medicare HMO | Admitting: Psychiatry

## 2023-01-24 DIAGNOSIS — R072 Precordial pain: Secondary | ICD-10-CM | POA: Diagnosis not present

## 2023-01-24 DIAGNOSIS — F4312 Post-traumatic stress disorder, chronic: Secondary | ICD-10-CM | POA: Diagnosis not present

## 2023-01-24 DIAGNOSIS — R002 Palpitations: Secondary | ICD-10-CM | POA: Diagnosis not present

## 2023-02-02 ENCOUNTER — Ambulatory Visit (HOSPITAL_COMMUNITY): Payer: Medicare HMO

## 2023-02-02 ENCOUNTER — Telehealth (HOSPITAL_COMMUNITY): Payer: Self-pay | Admitting: Internal Medicine

## 2023-02-02 NOTE — Telephone Encounter (Signed)
Patient called and cancelled echocardiogram for reason below:  02/02/2023 10:53 AM OX:[id number], [name] [name]  Cancel Rsn: Patient (had something come up, will c/b to r/s)   Order will be removed from the active echo wq. If patient calls to reschedule we will reinstate the order.  Thank you

## 2023-02-05 DIAGNOSIS — G5603 Carpal tunnel syndrome, bilateral upper limbs: Secondary | ICD-10-CM | POA: Diagnosis not present

## 2023-02-06 ENCOUNTER — Ambulatory Visit (INDEPENDENT_AMBULATORY_CARE_PROVIDER_SITE_OTHER): Payer: Medicare HMO | Admitting: Psychiatry

## 2023-02-06 ENCOUNTER — Telehealth: Payer: Self-pay | Admitting: Family Medicine

## 2023-02-06 ENCOUNTER — Encounter: Payer: Self-pay | Admitting: Psychiatry

## 2023-02-06 VITALS — BP 119/84 | HR 90 | Ht 67.0 in | Wt 187.0 lb

## 2023-02-06 DIAGNOSIS — G4700 Insomnia, unspecified: Secondary | ICD-10-CM | POA: Diagnosis not present

## 2023-02-06 DIAGNOSIS — F3162 Bipolar disorder, current episode mixed, moderate: Secondary | ICD-10-CM

## 2023-02-06 DIAGNOSIS — F4310 Post-traumatic stress disorder, unspecified: Secondary | ICD-10-CM | POA: Diagnosis not present

## 2023-02-06 DIAGNOSIS — N644 Mastodynia: Secondary | ICD-10-CM

## 2023-02-06 MED ORDER — LURASIDONE HCL 40 MG PO TABS: ORAL_TABLET | ORAL | 2 refills | Status: DC

## 2023-02-06 MED ORDER — LAMOTRIGINE 25 MG PO TABS: ORAL_TABLET | ORAL | 0 refills | Status: DC

## 2023-02-06 MED ORDER — LAMOTRIGINE 100 MG PO TABS: ORAL_TABLET | ORAL | 0 refills | Status: DC

## 2023-02-06 NOTE — Telephone Encounter (Signed)
I spoke with patient. Patient is having tenderness & pain in both breasts, but the left side is worse. Order placed for diagnostic mammogram bilateral, and ultrasound for each side.

## 2023-02-06 NOTE — Telephone Encounter (Signed)
I left patient a voicemail to call back, we need more information. Need to know if it's a regular mammogram, or if she's having breast pain/tenderness and which side.

## 2023-02-06 NOTE — Telephone Encounter (Signed)
Pt called to request an order / referral to go to [facility] on [address]!!
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
## 2023-02-08 ENCOUNTER — Ambulatory Visit: Payer: Medicare HMO | Admitting: Family Medicine

## 2023-02-12 NOTE — Progress Notes (Signed)
ACUTE VISIT Chief Complaint  Patient presents with   Stye    Left eye, started last Wednesday.    HPI: Ms.Yzabelle B Loree is a 52 y.o. female, who is here today with multiple complaints, the primary being a swollen area on left upper eye lid, which has notably improved but still presents with significant swelling. The swelling was severe enough previously to nearly close the eye. On Sunday night, she attempted self-treatment by draining what appeared to be a whitehead on the inner eyelid with a Q-tip, resulting in the discharge of pus and blood. Despite applying hot compresses, the patient reports minimal improvement.   In addition to the eye issue, the patient expresses concerns regarding urinary symptoms, specifically burning at the end of urination and urgency, which have persisted for the past two weeks. She had intended to seek medical attention earlier but cancelled appt due to car trouble.  She denies any sexual activity and has no history of pyelonephritis.  Her medical history includes hospitalization for kidney stones.  She reports no fever, chills, abdominal/pelvic pain, gross hematuria,vaginal discharge, or bleeding.  Furthermore,she reports a painful "bump" on the right-sided vulva, which has been worsening since Friday night. To alleviate the discomfort, she has resorted to sitting in Epsom salt baths and consuming leftover amoxicillin, of which only a two-day supply remains. . She is planning on arranging appt with her gyn.  Fibromyalgia and polyarthralgia: Generalized myalgias , stable. She is on Lyrica 300 mg bid, needs a refill. Sleeping at least 9 hours. She is not exercising regularly.  MDD following with psychiatrist.  Review of Systems  Constitutional:  Positive for fatigue. Negative for appetite change.  HENT:  Negative for mouth sores and sore throat.   Respiratory:  Negative for cough, shortness of breath and wheezing.   Gastrointestinal:  Negative for nausea  and vomiting.  Genitourinary:  Negative for decreased urine volume and difficulty urinating.  Skin:  Negative for rash.  Neurological:  Negative for syncope and facial asymmetry.  Psychiatric/Behavioral:  Negative for confusion and hallucinations. The patient is nervous/anxious.   See other pertinent positives and negatives in HPI.  Current Outpatient Medications on File Prior to Visit  Medication Sig Dispense Refill   acetaminophen (TYLENOL) 500 MG tablet Take 1,000 mg by mouth every 6 (six) hours as needed for moderate pain.     B Complex Vitamins (B-COMPLEX/B-12 SL) Place under the tongue.     Berberine Chloride (BERBERINE HCI PO) Take by mouth.     Cholecalciferol (VITAMIN D) 50 MCG (2000 UT) tablet Take 2,000 Units by mouth daily.     Cyanocobalamin (VITAMIN B 12 PO) Take by mouth.     EPINEPHrine 0.3 mg/0.3 mL IJ SOAJ injection Inject 0.3 mg into the muscle as needed for anaphylaxis. 2 each 0   ferrous sulfate 325 (65 FE) MG tablet Take 325 mg by mouth at bedtime.     lamoTRIgine (LAMICTAL) 100 MG tablet Take 1 tab po qd x 2 weeks, then increase to 1.5 tabs po qd 45 tablet 0   lamoTRIgine (LAMICTAL) 25 MG tablet Take 1 tablet (25 mg total) by mouth daily for 14 days, THEN 2 tablets (50 mg total) daily for 14 days. 45 tablet 0   LINZESS 145 MCG CAPS capsule TAKE 1 CAPSULE EVERY DAY AS NEEDED 90 capsule 0   lurasidone (LATUDA) 40 MG TABS tablet Take 1/2 tablet daily with supper for one week, then increase to 1 tablet daily with supper 30  tablet 2   MELATONIN PO Take by mouth at bedtime as needed.     Multiple Vitamins-Minerals (HAIR SKIN & NAILS ADVANCED PO) Take by mouth.     Multiple Vitamins-Minerals (MULTIVITAMIN WITH MINERALS) tablet Take 1 tablet by mouth at bedtime.     omeprazole (PRILOSEC) 40 MG capsule TAKE 1 CAPSULE EVERY DAY 90 capsule 1   propranolol (INDERAL) 40 MG tablet Take 1 tablet (40 mg total) by mouth 2 (two) times daily. (Patient taking differently: Take 40 mg by  mouth daily.) 180 tablet 2   metoprolol tartrate (LOPRESSOR) 100 MG tablet Take 1 tablet (100 mg total) by mouth once for 1 dose. Take 90-120 minutes prior to scan. 1 tablet 0   No current facility-administered medications on file prior to visit.   Past Medical History:  Diagnosis Date   Anxiety    Bipolar disorder ([place])    Chronic kidney disease    kidney infections   Depression    Fibromyalgia    GERD (gastroesophageal reflux disease)    History of cervical dysplasia    History of exercise intolerance    normal ETT 10-24-2016   History of kidney stones    Hyperlipidemia    Hypertension    Migraines    Osteoarthritis    PCOS (polycystic ovarian syndrome)    Pneumonia 2018   Rash    in area of eswl 04-23-2017   Right ureteral calculus    Sleep apnea    Tachycardia cardiologist-  dr harding   controlled w/ metoprolol   Allergies  Allergen Reactions   Aspirin Shortness Of Breath and Other (See Comments)    Angiodema   Bee Venom Hives    Face swelling and chest pain   Diclofenac Anaphylaxis   Naproxen Swelling    Facial swelling   Nsaids Anaphylaxis    Swelling of eyes mouth and throat difficulty breathing   Prednisone     Worsened depression, suicidal ideation    Terbinafine And Related     Worsened depression, suicidal ideation   Social History   Socioeconomic History   Marital status: Legally Separated    Spouse name: Not on file   Number of children: 2   Years of education: Not on file   Highest education level: Not on file  Occupational History   Occupation: Unemployed  Tobacco Use   Smoking status: Some Days    Packs/day: 0.50    Years: 25.00    Additional pack years: 0.00    Total pack years: 12.50    Types: Cigarettes   Smokeless tobacco: Never   Tobacco comments:    6-7 cig. daily  Vaping Use   Vaping Use: Never used  Substance and Sexual Activity   Alcohol use: No   Drug use: No    Types: Cocaine    Comment: last cocaine use 2014    Sexual activity: Not Currently    Partners: Male    Birth control/protection: None  Other Topics Concern   Not on file  Social History Narrative   She lives at home with her spouse. She does not exercise.   She currently denies recent recreational drug use.   Social Determinants of Health   Financial Resource Strain: Low Risk  (08/01/2022)   Overall Financial Resource Strain (CARDIA)    Difficulty of Paying Living Expenses: Not hard at all  Food Insecurity: No Food Insecurity (08/28/2022)   Hunger Vital Sign    Worried About Charity fundraiser in  the Last Year: Never true    [place] in the Last Year: Never true  Transportation Needs: No Transportation Needs (08/28/2022)   PRAPARE - Hydrologist (Medical): No    Lack of Transportation (Non-Medical): No  Physical Activity: Inactive (08/01/2022)   Exercise Vital Sign    Days of Exercise per Week: 0 days    Minutes of Exercise per Session: 0 min  Stress: Stress Concern Present (08/01/2022)   [place]    Feeling of Stress : To some extent  Social Connections: Socially Isolated (06/01/2021)   Social Connection and Isolation Panel [NHANES]    Frequency of Communication with Friends and Family: Once a week    Frequency of Social Gatherings with Friends and Family: Never    Attends Religious Services: Never    Marine scientist or Organizations: No    Attends Archivist Meetings: Never    Marital Status: Separated   Vitals:   02/13/23 0728  BP: 136/80  Pulse: 88  Resp: 12  Temp: 98.2 F (36.8 C)  SpO2: 100%   Body mass index is 29.46 kg/m.  Physical Exam Vitals and nursing note reviewed. Exam conducted with a chaperone present.  Constitutional:      General: She is not in acute distress.    Appearance: She is well-developed.  HENT:     Head: Normocephalic and atraumatic.  Eyes:     General:        Left  eye: Hordeolum (Inner aspect upper eye lid) present.    Extraocular Movements: Extraocular movements intact.     Conjunctiva/sclera: Conjunctivae normal.     Pupils: Pupils are equal, round, and reactive to light.   Cardiovascular:     Rate and Rhythm: Normal rate and regular rhythm.     Heart sounds: No murmur heard. Pulmonary:     Effort: Pulmonary effort is normal. No respiratory distress.     Breath sounds: Normal breath sounds.  Abdominal:     Palpations: Abdomen is soft. There is no mass.     Tenderness: There is no abdominal tenderness.  Genitourinary:   Musculoskeletal:     Comments: Tender trigger points with light touch along back,upper and lower extremities,and chest wall.  Skin:    General: Skin is warm.     Findings: No erythema.  Neurological:     Mental Status: She is alert and oriented to person, place, and time.     Comments: Antalgic gait, not assisted.  Psychiatric:        Mood and Affect: Affect normal. Mood is anxious.   ASSESSMENT AND PLAN:  Ms. Wentzell was seen for upper eye lid lesion and other complaints.  Hordeolum externum left upper eyelid Local heat a few times per day. Educated about Dx and treatment options. Avoid manipulating area with fingers or objects. Topical erythromycin at night for 7 days.  -     Erythromycin; Place 1 Application into the left eye at bedtime for 7 days.  Dispense: 7 g; Refill: 0  Dysuria We discussed possible etiologies. Last Ucx 4 months ago grew E. Coli 50,000-100,000 CFU sensitive toTRIMETH/SULFA . Empiric treatment with Bactrim DS bid x 7 (to also treat vulvar abscess). Will tailor treatment according to Ucx. Continue adequate hydration. Instructed about warning signs.  -     Urinalysis w microscopic + reflex cultur -     Urine Culture -  REFLEXIVE URINE CULTURE  Vulvar abscess Treat empirically with Bactrim DS bid x 7 days. Sitz bath for 15 min daily may also help. Arrange appt with gyn in case it  needs to be I&D. Instructed about warning signs.  -     Sulfamethoxazole-Trimethoprim; Take 1 tablet by mouth 2 (two) times daily for 7 days.  Dispense: 14 tablet; Refill: 0  Fibromyalgia Assessment & Plan: Still having generalized myalgias, in general problem is stable. Continue adequate sleep hygiene and Lyrica 300 mg bid. Recommend low impact exercise. Continue CBT. F/U in 6 months, before if needed.  Orders: -     Pregabalin; Take 1 capsule (300 mg total) by mouth 2 (two) times daily.  Dispense: 60 capsule; Refill: 3  Return in about 6 months (around 08/16/2023) for chronic problems.  Jakhari Space G. [place], MD  [facility]. [place] office.

## 2023-02-13 ENCOUNTER — Encounter: Payer: Self-pay | Admitting: Family Medicine

## 2023-02-13 ENCOUNTER — Ambulatory Visit (INDEPENDENT_AMBULATORY_CARE_PROVIDER_SITE_OTHER): Payer: Medicare HMO | Admitting: Family Medicine

## 2023-02-13 VITALS — BP 136/80 | HR 88 | Temp 98.2°F | Resp 12 | Ht 67.0 in | Wt 188.1 lb

## 2023-02-13 DIAGNOSIS — N764 Abscess of vulva: Secondary | ICD-10-CM | POA: Diagnosis not present

## 2023-02-13 DIAGNOSIS — M797 Fibromyalgia: Secondary | ICD-10-CM | POA: Diagnosis not present

## 2023-02-13 DIAGNOSIS — H00014 Hordeolum externum left upper eyelid: Secondary | ICD-10-CM | POA: Diagnosis not present

## 2023-02-13 DIAGNOSIS — R300 Dysuria: Secondary | ICD-10-CM

## 2023-02-13 MED ORDER — SULFAMETHOXAZOLE-TRIMETHOPRIM 800-160 MG PO TABS: 1.0000 | ORAL_TABLET | Freq: Two times a day (BID) | ORAL | 0 refills | Status: AC

## 2023-02-13 MED ORDER — PREGABALIN 300 MG PO CAPS: 300.0000 mg | ORAL_CAPSULE | Freq: Two times a day (BID) | ORAL | 3 refills | Status: DC

## 2023-02-13 MED ORDER — ERYTHROMYCIN 5 MG/GM OP OINT: 1.0000 | TOPICAL_OINTMENT | Freq: Every day | OPHTHALMIC | 0 refills | Status: AC

## 2023-02-13 NOTE — Patient Instructions (Addendum)
A few things to remember from today's visit:  Dysuria - Plan: Urinalysis with Culture Reflex  Hordeolum externum left upper eyelid - Plan: erythromycin ophthalmic ointment  Vulvar abscess - Plan: sulfamethoxazole-trimethoprim (BACTRIM DS) 800-160 MG tablet  Fibromyalgia - Plan: pregabalin (LYRICA) 300 MG capsule  Sitz bath for 15 min daily with warm water and Epson salt. Start bactrim. If not greatly improved in 48-72 hours arrange appt with gyn.  Local heat on left eye lid a few times per day for up to 3-5 min at the time. Topical antibiotic under left upper eye lid.  If you need refills for medications you take chronically, please call your pharmacy. Do not use My Chart to request refills or for acute issues that need immediate attention. If you send a my chart message, it may take a few days to be addressed, specially if I am not in the office.  Please be sure medication list is accurate. If a new problem present, please set up appointment sooner than planned today.

## 2023-02-14 ENCOUNTER — Ambulatory Visit: Payer: Medicare HMO | Admitting: Family Medicine

## 2023-02-15 ENCOUNTER — Telehealth: Payer: Self-pay | Admitting: Psychiatry

## 2023-02-15 LAB — URINE CULTURE
MICRO NUMBER:: 14746512
Result:: NO GROWTH
SPECIMEN QUALITY:: ADEQUATE

## 2023-02-15 LAB — URINALYSIS W MICROSCOPIC + REFLEX CULTURE
Bacteria, UA: NONE SEEN /HPF
Bilirubin Urine: NEGATIVE
Glucose, UA: NEGATIVE
Hgb urine dipstick: NEGATIVE
Hyaline Cast: NONE SEEN /LPF
Ketones, ur: NEGATIVE
Nitrites, Initial: NEGATIVE
Protein, ur: NEGATIVE
RBC / HPF: NONE SEEN /HPF (ref 0–2)
Specific Gravity, Urine: 1.016 (ref 1.001–1.035)
WBC, UA: NONE SEEN /HPF (ref 0–5)
pH: 5.5 (ref 5.0–8.0)

## 2023-02-15 LAB — CULTURE INDICATED

## 2023-02-15 NOTE — Telephone Encounter (Signed)
Patient reports having jaw and face pain from teeth clenching, as well as squinting her eyes in light and dark.  She said she had had the same sx previously, but could not remember the medication.  She went to [facility] and I don't see that we have records.   She has been on Lamictal previously without issue. Last night is the first time she took a whole pill. She said she will go back to 1/2 pill until she can hear from you and see if sx settle down, at least until she hears back from you.

## 2023-02-15 NOTE — Telephone Encounter (Signed)
Pt called reporting not sure which the lamotrigine or latuda is causing clentching her teeth and squinting eyes. Can't relax muscles. Contact # [phone]

## 2023-02-16 NOTE — Assessment & Plan Note (Signed)
Still having generalized myalgias, in general problem is stable. Continue adequate sleep hygiene and Lyrica 300 mg bid. Recommend low impact exercise. Continue CBT. F/U in 6 months, before if needed.

## 2023-02-19 NOTE — Telephone Encounter (Signed)
Left info on VM per DPR.

## 2023-02-21 DIAGNOSIS — F4312 Post-traumatic stress disorder, chronic: Secondary | ICD-10-CM | POA: Diagnosis not present

## 2023-02-26 ENCOUNTER — Other Ambulatory Visit: Payer: Self-pay

## 2023-02-26 DIAGNOSIS — M797 Fibromyalgia: Secondary | ICD-10-CM

## 2023-02-26 MED ORDER — PREGABALIN 300 MG PO CAPS: 300.0000 mg | ORAL_CAPSULE | Freq: Two times a day (BID) | ORAL | 0 refills | Status: DC

## 2023-03-02 ENCOUNTER — Ambulatory Visit (INDEPENDENT_AMBULATORY_CARE_PROVIDER_SITE_OTHER): Payer: Medicare HMO

## 2023-03-02 DIAGNOSIS — E538 Deficiency of other specified B group vitamins: Secondary | ICD-10-CM | POA: Diagnosis not present

## 2023-03-02 MED ORDER — CYANOCOBALAMIN 1000 MCG/ML IJ SOLN
1000.0000 ug | Freq: Once | INTRAMUSCULAR | Status: AC
  Administered 2023-03-02: 1000 ug via INTRAMUSCULAR

## 2023-03-02 NOTE — Progress Notes (Signed)
Pt here for monthly B12 injection per Dr. [name].  B12 given IM and pt tolerated injection well.

## 2023-03-05 ENCOUNTER — Ambulatory Visit (HOSPITAL_COMMUNITY): Payer: Medicare HMO | Attending: Cardiology

## 2023-03-05 DIAGNOSIS — R002 Palpitations: Secondary | ICD-10-CM

## 2023-03-05 DIAGNOSIS — R072 Precordial pain: Secondary | ICD-10-CM | POA: Diagnosis not present

## 2023-03-05 LAB — ECHOCARDIOGRAM COMPLETE
Area-P 1/2: 3.95 cm2
S' Lateral: 2.7 cm

## 2023-03-07 DIAGNOSIS — F4312 Post-traumatic stress disorder, chronic: Secondary | ICD-10-CM | POA: Diagnosis not present

## 2023-03-09 ENCOUNTER — Telehealth: Payer: Self-pay | Admitting: Psychiatry

## 2023-03-09 NOTE — Telephone Encounter (Signed)
LVM to RTC. Need sooner apt

## 2023-03-09 NOTE — Telephone Encounter (Addendum)
Pt called reporting she stopped Latuda as advised about 3 weeks ago. Still having same side effects. Contact pt @ [phone] next apt 5/14

## 2023-03-09 NOTE — Telephone Encounter (Signed)
Please call to offer her earlier follow-up to evaluate this further and to discuss treatment options.

## 2023-03-12 ENCOUNTER — Ambulatory Visit: Payer: Medicare HMO | Admitting: Psychiatry

## 2023-03-12 NOTE — Telephone Encounter (Signed)
Pt RTC. APT 4/22

## 2023-03-15 DIAGNOSIS — R1031 Right lower quadrant pain: Secondary | ICD-10-CM | POA: Diagnosis not present

## 2023-03-15 DIAGNOSIS — Z202 Contact with and (suspected) exposure to infections with a predominantly sexual mode of transmission: Secondary | ICD-10-CM | POA: Diagnosis not present

## 2023-03-15 DIAGNOSIS — Z113 Encounter for screening for infections with a predominantly sexual mode of transmission: Secondary | ICD-10-CM | POA: Diagnosis not present

## 2023-03-15 DIAGNOSIS — N898 Other specified noninflammatory disorders of vagina: Secondary | ICD-10-CM | POA: Diagnosis not present

## 2023-03-21 DIAGNOSIS — F4312 Post-traumatic stress disorder, chronic: Secondary | ICD-10-CM | POA: Diagnosis not present

## 2023-03-22 ENCOUNTER — Other Ambulatory Visit: Payer: Medicare HMO

## 2023-04-03 ENCOUNTER — Ambulatory Visit: Payer: Medicare HMO | Admitting: Psychiatry

## 2023-04-05 DIAGNOSIS — F4312 Post-traumatic stress disorder, chronic: Secondary | ICD-10-CM | POA: Diagnosis not present

## 2023-04-06 ENCOUNTER — Telehealth: Payer: Self-pay | Admitting: Psychiatry

## 2023-04-06 NOTE — Telephone Encounter (Signed)
Mailed warning letter per provider.

## 2023-04-19 DIAGNOSIS — F4312 Post-traumatic stress disorder, chronic: Secondary | ICD-10-CM | POA: Diagnosis not present

## 2023-04-23 ENCOUNTER — Ambulatory Visit: Payer: Medicare HMO | Admitting: Family Medicine

## 2023-05-02 ENCOUNTER — Encounter: Payer: Self-pay | Admitting: Family Medicine

## 2023-05-02 ENCOUNTER — Telehealth (INDEPENDENT_AMBULATORY_CARE_PROVIDER_SITE_OTHER): Payer: Medicare HMO | Admitting: Family Medicine

## 2023-05-02 ENCOUNTER — Telehealth: Payer: Self-pay | Admitting: Family Medicine

## 2023-05-02 DIAGNOSIS — H9203 Otalgia, bilateral: Secondary | ICD-10-CM | POA: Diagnosis not present

## 2023-05-02 DIAGNOSIS — J069 Acute upper respiratory infection, unspecified: Secondary | ICD-10-CM | POA: Diagnosis not present

## 2023-05-02 DIAGNOSIS — J0100 Acute maxillary sinusitis, unspecified: Secondary | ICD-10-CM

## 2023-05-02 MED ORDER — AMOXICILLIN-POT CLAVULANATE 500-125 MG PO TABS: 1.0000 | ORAL_TABLET | Freq: Two times a day (BID) | ORAL | 0 refills | Status: AC

## 2023-05-02 NOTE — Progress Notes (Signed)
Virtual Visit via Video Note  I connected with [name] [name] on 05/02/23 at  3:30 PM EDT by a video enabled telemedicine application and verified that I am speaking with the correct person using two identifiers.  Location patient: home Location provider:work or home office Persons participating in the virtual visit: patient, provider  I discussed the limitations of evaluation and management by telemedicine and the availability of in person appointments. The patient expressed understanding and agreed to proceed. Chief Complaint  Patient presents with   Sinusitis    Started Monday afternoon afternoon, pain in sinus area, congestion, burns when breathes in, left ear is a little achy and blowing out yellow mucus.  Taking tylenol and sudafed congestion, but feels worse today.   HPI: Pt is  a 52 yo female followed and seen for acute concern.  Pt with pressure in R face x 3 days.  Having HAs, R ear pain, nasal congestion, burning in sinuses, feels hot, and dry cough started today.  Also notes decreased appetite and increased production of yellow phlegm when blowing nose.  Feeling worse today. Denies fever, ST, chills, diarrhea nausea, vomiting. Pt's nephew was sick last week, required antibiotic Pt tried tylenol, sudafed, Vicks sinus. Patient notes a history of chronic sinusitis seen on CT done at the end of last year for migraines/headaches.  ROS: See pertinent positives and negatives per HPI.  Past Medical History:  Diagnosis Date   Anxiety    Bipolar disorder ([facility])    Chronic kidney disease    kidney infections   Depression    Fibromyalgia    GERD (gastroesophageal reflux disease)    History of cervical dysplasia    History of exercise intolerance    normal ETT 10-24-2016   History of kidney stones    Hyperlipidemia    Hypertension    Migraines    Osteoarthritis    PCOS (polycystic ovarian syndrome)    Pneumonia 2018   Rash    in area of eswl 04-23-2017   Right ureteral  calculus    Sleep apnea    Tachycardia cardiologist-  dr harding   controlled w/ metoprolol    Past Surgical History:  Procedure Laterality Date   ANTERIOR CERVICAL DECOMP/DISCECTOMY FUSION N/A 09/01/2015   Procedure: ANTERIOR CERVICAL DECOMPRESSION/DISCECTOMY FUSION PLATING BONEGRAFT CERVICAL FIVE-SIX;  Surgeon: Loura Halt Ditty, MD;  Location: [facility];  Service: Neurosurgery;  Laterality: N/A;   BIOPSY  05/20/2019   Procedure: BIOPSY;  Surgeon: Charna Elizabeth, MD;  Location: [facility];  Service: Endoscopy;;   BREAST BIOPSY Left 02/14/2022   U/S   BREAST BIOPSY Right 03/24/2022   MRI   BREAST EXCISIONAL BIOPSY Bilateral left 10/15/2002;   right 1997   left ductal system excision (papilloma w/ florid epithelial hyperplasia)/  right benign lumpectomy   BREAST LUMPECTOMY WITH RADIOACTIVE SEED LOCALIZATION Left 06/20/2022   Procedure: LEFT BREAST LUMPECTOMY WITH RADIOACTIVE SEED LOCALIZATION;  Surgeon: Harriette Bouillon, MD;  Location: [facility];  Service: General;  Laterality: Left;   CARDIOVASCULAR STRESS TEST  04/27/2009   normal nuclear study w/ no ischemia/  normal LV function and wall motion , ef 66%   CHOLECYSTECTOMY N/A 03/13/2013   Procedure: LAPAROSCOPIC CHOLECYSTECTOMY;  Surgeon: Shelly Rubenstein, MD;  Location: [facility];  Service: General;  Laterality: N/A;   COLONOSCOPY  last one 11-08-2006   COLONOSCOPY N/A 05/20/2019   Procedure: COLONOSCOPY;  Surgeon: Charna Elizabeth, MD;  Location: [facility];  Service: Endoscopy;  Laterality: N/A;  CYSTOSCOPY N/A 05/08/2018   Procedure: CYSTOSCOPY;  Surgeon: Huel Cote, MD;  Location: [facility];  Service: Gynecology;  Laterality: N/A;   CYSTOSCOPY WITH RETROGRADE PYELOGRAM, URETEROSCOPY AND STENT PLACEMENT Right 05/18/2017   Procedure: CYSTOSCOPY WITH RETROGRADE PYELOGRAM, URETEROSCOPY AND STENT PLACEMENT;  Surgeon: Sebastian Ache, MD;  Location: [facility];  Service: Urology;   Laterality: Right;   DX LAPAROSCOPY W/ LYSIS ADHESIONS/  LASER VAPORIZATION OF CERVIX  06/25/2002   ESOPHAGOGASTRODUODENOSCOPY  04/25/2005   ESOPHAGOGASTRODUODENOSCOPY (EGD) WITH PROPOFOL N/A 05/20/2019   Procedure: ESOPHAGOGASTRODUODENOSCOPY (EGD) WITH PROPOFOL;  Surgeon: Charna Elizabeth, MD;  Location: [facility];  Service: Endoscopy;  Laterality: N/A;   EXPLORATORY LEFT THUMB REPAIR TENDON/ LACERATION  09/27/1999   EXTRACORPOREAL SHOCK WAVE LITHOTRIPSY Right 04/23/2017   Procedure: RIGHT EXTRACORPOREAL SHOCK WAVE LITHOTRIPSY (ESWL);  Surgeon: Sebastian Ache, MD;  Location: [facility];  Service: Urology;  Laterality: Right;   EXTRACORPOREAL SHOCK WAVE LITHOTRIPSY Right 04/23/2017   HOLMIUM LASER APPLICATION Right 05/18/2017   Procedure: HOLMIUM LASER APPLICATION;  Surgeon: Sebastian Ache, MD;  Location: [facility];  Service: Urology;  Laterality: Right;   LAPAROSCOPIC VAGINAL HYSTERECTOMY WITH SALPINGECTOMY Bilateral 05/08/2018   Procedure: LAPAROSCOPIC ASSISTED VAGINAL HYSTERECTOMY WITH SALPINGECTOMY,  I & D Sebaceous Left Labia;  Surgeon: Huel Cote, MD;  Location: [facility];  Service: Gynecology;  Laterality: Bilateral;   LAPAROSCOPY  07/30/2012   Procedure: LAPAROSCOPY DIAGNOSTIC;  Surgeon: Oliver Pila, MD;  Location: [facility];  Service: Gynecology;  Laterality: N/A;   POLYPECTOMY  05/20/2019   Procedure: POLYPECTOMY;  Surgeon: Charna Elizabeth, MD;  Location: [facility];  Service: Endoscopy;;   WISDOM TOOTH EXTRACTION      Family History  Problem Relation Age of Onset   Diabetes Mother    Hypertension Mother    Arthritis Mother    Depression Mother    Alcohol abuse Father    Diabetes Father    Hypertension Father    Arthritis Father    Depression Father    Hearing loss Father    Depression Sister    Early death Maternal Grandfather    Heart attack Maternal Grandfather    Arthritis Maternal Grandmother    COPD Maternal Grandmother    Depression Maternal  Grandmother    Heart disease Maternal Grandmother    COPD Paternal Grandfather    Heart attack Paternal Grandfather    Asthma Paternal Grandmother    Kidney disease Paternal Grandmother    Arthritis Paternal Grandmother    Cancer Paternal Grandmother    COPD Paternal Grandmother    Hearing loss Paternal Grandmother    Heart disease Paternal Grandmother    Hypertension Paternal Grandmother    Hyperlipidemia Paternal Grandmother    Stroke Paternal Grandmother    Heart attack Paternal Grandmother    Schizophrenia Son    COPD Son    Drug abuse Son    Emphysema Other    Tongue cancer Other    Coronary artery disease Other    Breast cancer Neg Hx      Current Outpatient Medications:    acetaminophen (TYLENOL) 500 MG tablet, Take 1,000 mg by mouth every 6 (six) hours as needed for moderate pain., Disp: , Rfl:    B Complex Vitamins (B-COMPLEX/B-12 SL), Place under the tongue., Disp: , Rfl:    Berberine Chloride (BERBERINE HCI PO), Take by mouth., Disp: , Rfl:    Cholecalciferol (VITAMIN D) 50 MCG (2000 UT) tablet, Take 2,000 Units by mouth daily., Disp: , Rfl:  Cyanocobalamin (VITAMIN B 12 PO), Take by mouth., Disp: , Rfl:    EPINEPHrine 0.3 mg/0.3 mL IJ SOAJ injection, Inject 0.3 mg into the muscle as needed for anaphylaxis., Disp: 2 each, Rfl: 0   ferrous sulfate 325 (65 FE) MG tablet, Take 325 mg by mouth at bedtime., Disp: , Rfl:    lamoTRIgine (LAMICTAL) 100 MG tablet, Take 1 tab po qd x 2 weeks, then increase to 1.5 tabs po qd, Disp: 45 tablet, Rfl: 0   LINZESS 145 MCG CAPS capsule, TAKE 1 CAPSULE EVERY DAY AS NEEDED, Disp: 90 capsule, Rfl: 0   lurasidone (LATUDA) 40 MG TABS tablet, Take 1/2 tablet daily with supper for one week, then increase to 1 tablet daily with supper, Disp: 30 tablet, Rfl: 2   MELATONIN PO, Take by mouth at bedtime as needed., Disp: , Rfl:    Multiple Vitamins-Minerals (HAIR SKIN & NAILS ADVANCED PO), Take by mouth., Disp: , Rfl:    Multiple  Vitamins-Minerals (MULTIVITAMIN WITH MINERALS) tablet, Take 1 tablet by mouth at bedtime., Disp: , Rfl:    omeprazole (PRILOSEC) 40 MG capsule, TAKE 1 CAPSULE EVERY DAY, Disp: 90 capsule, Rfl: 1   propranolol (INDERAL) 40 MG tablet, Take 1 tablet (40 mg total) by mouth 2 (two) times daily. (Patient taking differently: Take 40 mg by mouth daily.), Disp: 180 tablet, Rfl: 2   lamoTRIgine (LAMICTAL) 25 MG tablet, Take 1 tablet (25 mg total) by mouth daily for 14 days, THEN 2 tablets (50 mg total) daily for 14 days., Disp: 45 tablet, Rfl: 0   metoprolol tartrate (LOPRESSOR) 100 MG tablet, Take 1 tablet (100 mg total) by mouth once for 1 dose. Take 90-120 minutes prior to scan., Disp: 1 tablet, Rfl: 0   pregabalin (LYRICA) 300 MG capsule, Take 1 capsule (300 mg total) by mouth 2 (two) times daily for 7 days., Disp: 14 capsule, Rfl: 0  EXAM:  VITALS per patient if applicable: RR between 12-20 bpm  GENERAL: alert, oriented, appears well and in no acute distress  HEENT: atraumatic, conjunctiva clear, no obvious abnormalities on inspection of external nose and ears  NECK: normal movements of the head and neck  LUNGS: on inspection no signs of respiratory distress, breathing rate appears normal, no obvious gross SOB, gasping or wheezing  CV: no obvious cyanosis  MS: moves all visible extremities without noticeable abnormality  PSYCH/NEURO: pleasant and cooperative, no obvious depression or anxiety, speech and thought processing grossly intact  ASSESSMENT AND PLAN:  Discussed the following assessment and plan:  Acute maxillary sinusitis, recurrence not specified - Plan: amoxicillin-clavulanate (AUGMENTIN) 500-125 MG tablet  Viral URI with cough  Otalgia of both ears  Given current symptoms patient encouraged to take an at home COVID test if available.  Patient to notify clinic if test results positive.  Start ABX for sinusitis.  Supportive care with OTC cough/cold medications, nasal rinse,  Tylenol.  Given strict precautions.  Follow-up as needed.   I discussed the assessment and treatment plan with the patient. The patient was provided an opportunity to ask questions and all were answered. The patient agreed with the plan and demonstrated an understanding of the instructions.   The patient was advised to call back or seek an in-person evaluation if the symptoms worsen or if the condition fails to improve as anticipated.   Deeann Saint, MD

## 2023-05-02 NOTE — Telephone Encounter (Signed)
Ok

## 2023-05-02 NOTE — Telephone Encounter (Signed)
Pt called to report that she took a covid test as directed by provider Salomon Fick) and it was negative

## 2023-05-03 DIAGNOSIS — F4312 Post-traumatic stress disorder, chronic: Secondary | ICD-10-CM | POA: Diagnosis not present

## 2023-05-07 ENCOUNTER — Ambulatory Visit (INDEPENDENT_AMBULATORY_CARE_PROVIDER_SITE_OTHER): Payer: Medicare HMO | Admitting: Psychiatry

## 2023-05-07 ENCOUNTER — Encounter: Payer: Self-pay | Admitting: Psychiatry

## 2023-05-07 DIAGNOSIS — F4310 Post-traumatic stress disorder, unspecified: Secondary | ICD-10-CM

## 2023-05-07 DIAGNOSIS — F3162 Bipolar disorder, current episode mixed, moderate: Secondary | ICD-10-CM

## 2023-05-07 DIAGNOSIS — G4700 Insomnia, unspecified: Secondary | ICD-10-CM

## 2023-05-07 MED ORDER — CARIPRAZINE HCL 1.5 MG PO CAPS: 1.5000 mg | ORAL_CAPSULE | Freq: Every day | ORAL | 0 refills | Status: DC
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
## 2023-05-14 ENCOUNTER — Telehealth: Payer: Self-pay | Admitting: Psychiatry

## 2023-05-14 NOTE — Telephone Encounter (Signed)
Recommend stopping Vraylar since this sounds like a side effect (akathisia) from [employer]. Please let her know that it takes awhile for Vraylar to completely wash out, so the side effect will likely gradually improve over the next several days or longer.

## 2023-05-14 NOTE — Telephone Encounter (Signed)
Patient informed. 

## 2023-05-14 NOTE — Telephone Encounter (Signed)
Patient called in stating that at appt with JC earlier this month she was started on Vraylar 1.5mg  and she is experiencing side effects. " Feels like I'm coming out of my skin." Can't relax or stay still and has upset stomach. Just feels miserable. She inquired about adding something to help her stay relaxed. Ph: [phone]

## 2023-05-14 NOTE — Telephone Encounter (Signed)
Please see message from patient. She started medication on 6/17. She knows that it could take 4-6 weeks to see benefit and reports SE with previous medications, but said she is miserable, very restless. Doesn't know if she needs a medication change or something additional to get her thru until Leafy Kindle has time to take effect.

## 2023-05-21 DIAGNOSIS — F4312 Post-traumatic stress disorder, chronic: Secondary | ICD-10-CM | POA: Diagnosis not present

## 2023-05-22 DIAGNOSIS — F423 Hoarding disorder: Secondary | ICD-10-CM | POA: Diagnosis not present

## 2023-05-29 DIAGNOSIS — F4312 Post-traumatic stress disorder, chronic: Secondary | ICD-10-CM | POA: Diagnosis not present

## 2023-05-29 DIAGNOSIS — F4010 Social phobia, unspecified: Secondary | ICD-10-CM | POA: Diagnosis not present

## 2023-06-01 ENCOUNTER — Ambulatory Visit: Payer: Medicare HMO | Admitting: Psychiatry

## 2023-06-04 ENCOUNTER — Telehealth: Payer: Self-pay | Admitting: Psychiatry

## 2023-06-04 NOTE — Telephone Encounter (Signed)
Pt called with new phone number [phone]. Ask how long had been changed. She stated about week and half ago. Then ask her balance and when apt is. Advised was 7/12. She wanted me to let you know she thought is was 7/17. Mentioned you will dismiss her for missing apt. Could not tell if all this info was sincere. She said I'll have to find another provider.   Just an [place]
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
## 2023-06-06 ENCOUNTER — Encounter: Payer: Self-pay | Admitting: Family Medicine

## 2023-06-06 ENCOUNTER — Ambulatory Visit (INDEPENDENT_AMBULATORY_CARE_PROVIDER_SITE_OTHER): Payer: Medicare HMO | Admitting: Family Medicine

## 2023-06-06 VITALS — BP 110/70 | HR 76 | Temp 98.2°F | Resp 12 | Ht 67.0 in | Wt 181.0 lb

## 2023-06-06 DIAGNOSIS — S0993XD Unspecified injury of face, subsequent encounter: Secondary | ICD-10-CM

## 2023-06-06 DIAGNOSIS — R1031 Right lower quadrant pain: Secondary | ICD-10-CM

## 2023-06-06 DIAGNOSIS — I10 Essential (primary) hypertension: Secondary | ICD-10-CM

## 2023-06-06 DIAGNOSIS — E538 Deficiency of other specified B group vitamins: Secondary | ICD-10-CM

## 2023-06-06 DIAGNOSIS — M791 Myalgia, unspecified site: Secondary | ICD-10-CM

## 2023-06-06 DIAGNOSIS — R000 Tachycardia, unspecified: Secondary | ICD-10-CM

## 2023-06-06 DIAGNOSIS — F1421 Cocaine dependence, in remission: Secondary | ICD-10-CM

## 2023-06-06 DIAGNOSIS — M797 Fibromyalgia: Secondary | ICD-10-CM

## 2023-06-06 MED ORDER — CYANOCOBALAMIN 1000 MCG/ML IJ SOLN
1000.0000 ug | Freq: Once | INTRAMUSCULAR | Status: AC
  Administered 2023-06-06: 1000 ug via INTRAMUSCULAR

## 2023-06-06 MED ORDER — PROPRANOLOL HCL 40 MG PO TABS: 40.0000 mg | ORAL_TABLET | Freq: Two times a day (BID) | ORAL | 2 refills | Status: DC

## 2023-06-06 MED ORDER — LOSARTAN POTASSIUM 25 MG PO TABS: 25.0000 mg | ORAL_TABLET | Freq: Every day | ORAL | 0 refills | Status: DC
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
## 2023-06-06 NOTE — Assessment & Plan Note (Signed)
Last used in 2013.

## 2023-06-06 NOTE — Assessment & Plan Note (Signed)
Today after verbal consent B12 1000 mcg IM given x 1 and continue q 2-3 months. B12 will be added to next labs.

## 2023-06-06 NOTE — Assessment & Plan Note (Signed)
Problem is well controlled. Continue monitoring BP at home. Continue Propanol 40 mg bid.

## 2023-06-06 NOTE — Assessment & Plan Note (Signed)
In general problem is stable. Continue adequate sleep hygiene and Lyrica 300 mg bid. Low impact exercise recommended. F/U in 4 months, before if needed. PDMP reviewed.

## 2023-06-06 NOTE — Assessment & Plan Note (Addendum)
Reporting elevated BP's at home, 140's/90's. Today Losartan 25 mg daily added. Continue Propranolol 40 mg bid. Continue monitoring BP regularly and low salt diet. BMP in 7-10 days. Instructed about warning signs. BP readings in 4 weeks.

## 2023-06-07 DIAGNOSIS — F4312 Post-traumatic stress disorder, chronic: Secondary | ICD-10-CM | POA: Diagnosis not present

## 2023-06-11 ENCOUNTER — Ambulatory Visit
Admission: RE | Admit: 2023-06-11 | Discharge: 2023-06-11 | Disposition: A | Discharge status: Home / Self Care | Payer: Medicare HMO | Source: Ambulatory Visit | Attending: Family Medicine | Admitting: Family Medicine

## 2023-06-11 ENCOUNTER — Ambulatory Visit: Payer: Medicare HMO

## 2023-06-11 ENCOUNTER — Ambulatory Visit: Admission: RE | Admit: 2023-06-11 | Payer: Medicare HMO | Source: Ambulatory Visit

## 2023-06-11 DIAGNOSIS — N644 Mastodynia: Secondary | ICD-10-CM

## 2023-06-15 ENCOUNTER — Other Ambulatory Visit: Payer: Self-pay | Admitting: Family Medicine

## 2023-06-15 DIAGNOSIS — M797 Fibromyalgia: Secondary | ICD-10-CM

## 2023-06-15 NOTE — Telephone Encounter (Signed)
Prescription Request  06/15/2023  LOV: 06/06/2023  What is the name of the medication or equipment? pregabalin (LYRICA) 300 MG capsule (Expired)  Have you contacted your pharmacy to request a refill? No   Which pharmacy would you like this sent to?   [facility] - [place], [place] - [address] [place] [address] Phone: [phone] Fax: [phone]     Patient notified that their request is being sent to the clinical staff for review and that they should receive a response within 2 business days.   Please advise at Mobile [phone] (mobile)

## 2023-06-18 MED ORDER — PREGABALIN 300 MG PO CAPS: 300.0000 mg | ORAL_CAPSULE | Freq: Two times a day (BID) | ORAL | 3 refills | Status: DC

## 2023-06-20 ENCOUNTER — Encounter (INDEPENDENT_AMBULATORY_CARE_PROVIDER_SITE_OTHER): Payer: Self-pay

## 2023-06-21 ENCOUNTER — Telehealth: Payer: Self-pay | Admitting: Family Medicine

## 2023-06-21 DIAGNOSIS — G43809 Other migraine, not intractable, without status migrainosus: Secondary | ICD-10-CM

## 2023-06-21 NOTE — Telephone Encounter (Signed)
Requesting referral to neurologist, referring to previous head injuries discussed in office. Says she now has bad headaches, sleeps a lot.

## 2023-06-22 NOTE — Addendum Note (Signed)
Addended by: Kathreen Devoid on: 06/22/2023 07:23 AM   Modules accepted: Orders

## 2023-06-22 NOTE — Telephone Encounter (Signed)
Referral placed.

## 2023-07-20 ENCOUNTER — Encounter: Payer: Medicare Other | Admitting: Family Medicine

## 2023-07-20 LAB — HM MAMMOGRAPHY

## 2023-07-26 DIAGNOSIS — F4312 Post-traumatic stress disorder, chronic: Secondary | ICD-10-CM | POA: Diagnosis not present

## 2023-08-03 ENCOUNTER — Ambulatory Visit: Payer: Medicare HMO | Admitting: Family

## 2023-08-09 DIAGNOSIS — F4312 Post-traumatic stress disorder, chronic: Secondary | ICD-10-CM | POA: Diagnosis not present

## 2023-08-16 ENCOUNTER — Ambulatory Visit: Payer: Medicare HMO | Admitting: Family Medicine

## 2023-08-16 VITALS — Wt 183.0 lb

## 2023-08-16 DIAGNOSIS — Z0000 Encounter for general adult medical examination without abnormal findings: Secondary | ICD-10-CM

## 2023-08-16 NOTE — Progress Notes (Signed)
 Patient unable to obtain vital signs due to telehealth visit

## 2023-08-16 NOTE — Patient Instructions (Addendum)
I really enjoyed getting to talk with you today! I am available on Tuesdays and Thursdays for virtual visits if you have any questions or concerns, or if I can be of any further assistance.   CHECKLIST FROM ANNUAL WELLNESS VISIT:  -Follow up (please call to schedule if not scheduled after visit):   -schedule in person medical visit with Dr. [name] asap to evaluate your concerns   -yearly for annual wellness visit with primary care office  Here is a list of your preventive care/health maintenance measures and the plan for each if any are due:  PLAN For any measures below that may be due:  -can get vaccines at the pharmacy, for the covid vaccine wait until 3 months after recent infection  Health Maintenance  Topic Date Due   COVID-19 Vaccine (1) Never done   DTaP/Tdap/Td (2 - Td or Tdap) 11/07/2022   INFLUENZA VACCINE  06/21/2023   Medicare Annual Wellness (AWV)  08/15/2024   Colonoscopy  05/19/2029   Hepatitis C Screening  Completed   HIV Screening  Completed   HPV VACCINES  Aged Out    -See a dentist at least yearly  -Get your eyes checked and then per your eye specialist's recommendations  -Other issues addressed today:   Quit smoking, Can call 1-800-QUITNOW for help!   -I have included below further information regarding a healthy whole foods based diet, physical activity guidelines for adults, stress management and opportunities for social connections. I hope you find this information useful.   -----------------------------------------------------------------------------------------------------------------------------------------------------------------------------------------------------------------------------------------------------------  NUTRITION: -eat real food: lots of colorful vegetables (half the plate) and fruits -5-7 servings of vegetables and fruits per day (fresh or steamed is best), exp. 2 servings of vegetables with lunch and dinner and 2 servings of fruit  per day. Berries and greens such as kale and collards are great choices.  -consume on a regular basis: whole grains (make sure first ingredient on label contains the word "whole"), fresh fruits, fish, nuts, seeds, healthy oils (such as olive oil, avocado oil, grape seed oil) -may eat small amounts of dairy and lean meat on occasion, but avoid processed meats such as ham, bacon, lunch meat, etc. -drink water -try to avoid fast food and pre-packaged foods, processed meat -most experts advise limiting sodium to < 2300mg  per day, should limit further is any chronic conditions such as high blood pressure, heart disease, diabetes, etc. The American Heart Association advised that < 1500mg  is is ideal -try to avoid foods that contain any ingredients with names you do not recognize  -try to avoid sugar/sweets (except for the natural sugar that occurs in fresh fruit) -try to avoid sweet drinks -try to avoid white rice, white bread, pasta (unless whole grain), white or yellow potatoes  EXERCISE GUIDELINES FOR ADULTS: -if you wish to increase your physical activity, do so gradually and with the approval of your doctor -STOP and seek medical care immediately if you have any chest pain, chest discomfort or trouble breathing when starting or increasing exercise  -move and stretch your body, legs, feet and arms when sitting for long periods -Physical activity guidelines for optimal health in adults: -least 150 minutes per week of aerobic exercise (can talk, but not sing) once approved by your doctor, 20-30 minutes of sustained activity or two 10 minute episodes of sustained activity every day.  -resistance training at least 2 days per week if approved by your doctor -balance exercises 3+ days per week:   Stand somewhere where you have something  sturdy to hold onto if you lose balance.    1) lift up on toes, start with 5x per day and work up to 20x   2) stand and lift on leg straight out to the side so that foot  is a few inches of the floor, start with 5x each side and work up to 20x each side   3) stand on one foot, start with 5 seconds each side and work up to 20 seconds on each side  If you need ideas or help with getting more active:  -Silver sneakers https://tools.silversneakers.com  -Walk with a Doc: http://www.duncan-williams.com/  -try to include resistance (weight lifting/strength building) and balance exercises twice per week: or the following link for ideas: http://castillo-powell.com/  BuyDucts.dk  STRESS MANAGEMENT: -can try meditating, or just sitting quietly with deep breathing while intentionally relaxing all parts of your body for 5 minutes daily -if you need further help with stress, anxiety or depression please follow up with your primary doctor or contact the wonderful folks at [facility]: [phone]  SOCIAL CONNECTIONS: -options in [place] if you wish to engage in more social and exercise related activities:  -Silver sneakers https://tools.silversneakers.com  -Walk with a Doc: http://www.duncan-williams.com/  -Check out the [facility] Active Adults 50+ section on the [place] [employer] (hiking clubs, book clubs, cards and games, chess, exercise classes, aquatic classes and much more) - see the website for details: https://www.[place].gov/departments/parks-recreation/active-adults50  -YouTube has lots of exercise videos for different ages and abilities as well  -[facility] (a variety of indoor and outdoor inperson activities for adults). [phone]. [address].  -Virtual Online Classes (a variety of topics): see seniorplanet.org or call [phone]  -consider volunteering at a school, hospice center, church, senior center or elsewhere  ADVANCED HEALTHCARE DIRECTIVES:  [place] Advanced Directives  assistance:   ExpressWeek.com.cy  Everyone should have advanced health care directives in place. This is so that you get the care you want, should you ever be in a situation where you are unable to make your own medical decisions.   From the  Advanced Directive Website: "Advance Health Care Directives are legal documents in which you give written instructions about your health care if, in the future, you cannot speak for yourself.   A health care power of attorney allows you to name a person you trust to make your health care decisions if you cannot make them yourself. A declaration of a desire for a natural death (or living will) is document, which states that you desire not to have your life prolonged by extraordinary measures if you have a terminal or incurable illness or if you are in a vegetative state. An advance instruction for mental health treatment makes a declaration of instructions, information and preferences regarding your mental health treatment. It also states that you are aware that the advance instruction authorizes a mental health treatment provider to act according to your wishes. It may also outline your consent or refusal of mental health treatment. A declaration of an anatomical gift allows anyone over the age of 64 to make a gift by will, organ donor card or other document."   Please see the following website or an elder law attorney for forms, FAQs and for completion of advanced directives: [place] Health Care Directives Advance Health Care Directives (http://guzman.com/)  Or copy and paste the following to your web browser: PoshChat.fi

## 2023-08-16 NOTE — Progress Notes (Signed)
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 make your health care decisions if you cannot make them yourself. A declaration of a desire for a natural death (or living will) is document, which states that you desire not to have your life prolonged by extraordinary measures if you have a terminal or incurable illness or if you are in a vegetative state. An advance instruction for mental health treatment makes a declaration of instructions, information and preferences regarding your mental health treatment. It also states that you are aware that  the advance instruction authorizes a mental health treatment provider to act according to your wishes. It may also outline your consent or refusal of mental health treatment. A declaration of an anatomical gift allows anyone over the age of 27 to make a gift by will, organ donor card or other document."   Please see the following website or an elder law attorney for forms, FAQs and for completion of advanced directives: [place] TEFL teacher Health Care Directives Advance Health Care Directives ([email or web address]/)  Or copy and paste the following to your web browser: [email or web address]           Terressa Koyanagi, DO

## 2023-08-23 DIAGNOSIS — F4312 Post-traumatic stress disorder, chronic: Secondary | ICD-10-CM | POA: Diagnosis not present

## 2023-08-27 NOTE — Progress Notes (Signed)
HPI: [name] is a 52 y.o. female with a PMHx significant for bipolar disorder, depression, anxiety, fibromyalgia, HTN, sinus tachycardia, constipation, OSA on CPAP, GERD, vitamin D deficiency, and HLD among some, who is here today for her routine physical.  Last CPE: 07/18/2022  Exercise: She has not been exercising regularly.  Diet: She is cooking at home, eating vegetables daily, and eating mainly chicken an fish. She snacks on leftovers from her meals or cheese.  Sleep: She sleeps 10+ hours per day.  Alcohol Use: She reports she does not drink alcohol.  Smoking: She has not smoked for 2-3 weeks since her last Covid case. Vision: It's been more than a year since she has seen her eye doctor. Dental: UTD on routine dental care.   Immunization History  Administered Date(s) Administered   Influenza Whole 09/01/2009, 08/07/2012   Influenza,inj,Quad PF,6+ Mos 09/13/2018   Influenza-Unspecified 09/02/2022   Tdap 11/07/2012   Health Maintenance  Topic Date Due   COVID-19 Vaccine (1) Never done   DTaP/Tdap/Td (2 - Td or Tdap) 11/07/2022   INFLUENZA VACCINE  06/21/2023   Medicare Annual Wellness (AWV)  08/15/2024   Colonoscopy  05/19/2029   Hepatitis C Screening  Completed   HIV Screening  Completed   HPV VACCINES  Aged Out   Chronic medical problems:   Hypertension:  Currently on propanolol 40 mg bid. She never picked up her losartan.  She has been checking at home and reports her diastolic readings have been high, she is not sure about number.  Sinus tachycardia: She mentions she has been having intermittent episodes of palpitations at night since her recent Covid 19 infection, about 2 weeks ago.  She endorses residual cough with some SOB and chest pain while coughing.  Her heart rate has been as high as 134 while checking, with averages in 80s-90s. He has seen cardiologist and that she is reporting otherwise negative workup.  Lab Results  Component Value Date    CREATININE 0.8 12/29/2022   BUN 17 12/29/2022   NA 138 12/29/2022   K 4.3 12/29/2022   CL 105 12/29/2022   CO2 25 (A) 12/29/2022   Fibromyalgia:  Currently on Lyrica 300 mg bid.   Vitamin D deficiency: She has not been taking vitamin D supplementation.   Depression:  She states her depression has been very poorly controlled recently. She notes she has been out of medications for a month.  She mentions she has not seen her psychiatrist for four months because she missed multiple appointments and was dropped as a patient. She needs a new psychiatrist but has not been able to find one yet. She is still seeing psychotherapy every 2 weeks.   Review of Systems  Constitutional:  Positive for fatigue. Negative for activity change, appetite change and fever.  HENT:  Positive for postnasal drip. Negative for hearing loss, mouth sores, sore throat and trouble swallowing.   Eyes:  Negative for redness and visual disturbance.  Respiratory:  Positive for cough. Negative for wheezing.   Cardiovascular:  Positive for palpitations. Negative for leg swelling.  Gastrointestinal:  Negative for abdominal pain, nausea and vomiting.       No changes in bowel habits.  Endocrine: Negative for cold intolerance, heat intolerance, polydipsia, polyphagia and polyuria.  Genitourinary:  Negative for decreased urine volume, dysuria, hematuria, vaginal bleeding and vaginal discharge.  Musculoskeletal:  Positive for arthralgias, back pain and myalgias. Negative for gait problem.  Skin:  Negative for color change and rash.  Allergic/Immunologic: Positive for environmental allergies.  Neurological:  Negative for syncope, weakness and headaches.  Hematological:  Negative for adenopathy. Does not bruise/bleed easily.  Psychiatric/Behavioral:  Negative for confusion and hallucinations. The patient is nervous/anxious.   All other systems reviewed and are negative.  Current Outpatient Medications on File Prior to Visit   Medication Sig Dispense Refill   acetaminophen (TYLENOL) 500 MG tablet Take 1,000 mg by mouth every 6 (six) hours as needed for moderate pain.     Ascorbic Acid (VITAMIN C PO) Take by mouth.     B Complex Vitamins (B-COMPLEX/B-12 SL) Place under the tongue.     Cholecalciferol (VITAMIN D) 50 MCG (2000 UT) tablet Take 2,000 Units by mouth daily.     Cyanocobalamin (VITAMIN B 12 PO) Take by mouth.     Cyanocobalamin (VITAMIN B-12 IJ) Inject as directed.     EPINEPHrine 0.3 mg/0.3 mL IJ SOAJ injection Inject 0.3 mg into the muscle as needed for anaphylaxis. 2 each 0   ferrous sulfate 325 (65 FE) MG tablet Take 325 mg by mouth at bedtime.     losartan (COZAAR) 25 MG tablet Take 1 tablet (25 mg total) by mouth daily. 90 tablet 0   Multiple Vitamins-Minerals (MULTIVITAMIN WITH MINERALS) tablet Take 1 tablet by mouth at bedtime.     omeprazole (PRILOSEC) 40 MG capsule TAKE 1 CAPSULE EVERY DAY 90 capsule 1   pregabalin (LYRICA) 300 MG capsule Take 1 capsule (300 mg total) by mouth 2 (two) times daily. 60 capsule 3   propranolol (INDERAL) 40 MG tablet Take 1 tablet (40 mg total) by mouth 2 (two) times daily. 180 tablet 2   No current facility-administered medications on file prior to visit.    Past Medical History:  Diagnosis Date   Anxiety    Bipolar disorder (HCC)    Chronic kidney disease    kidney infections   Depression    Fibromyalgia    GERD (gastroesophageal reflux disease)    History of cervical dysplasia    History of exercise intolerance    normal ETT 10-24-2016   History of kidney stones    Hyperlipidemia    Hypertension    Migraines    Osteoarthritis    PCOS (polycystic ovarian syndrome)    Pneumonia 2018   Rash    in area of eswl 04-23-2017   Right ureteral calculus    Sleep apnea    Tachycardia cardiologist-  dr harding   controlled w/ metoprolol    Past Surgical History:  Procedure Laterality Date   ANTERIOR CERVICAL DECOMP/DISCECTOMY FUSION N/A 09/01/2015    Procedure: ANTERIOR CERVICAL DECOMPRESSION/DISCECTOMY FUSION PLATING BONEGRAFT CERVICAL FIVE-SIX;  Surgeon: Loura Halt Ditty, MD;  Location: [facility];  Service: Neurosurgery;  Laterality: N/A;   BIOPSY  05/20/2019   Procedure: BIOPSY;  Surgeon: Charna Elizabeth, MD;  Location: [facility];  Service: Endoscopy;;   BREAST BIOPSY Left 02/14/2022   U/S   BREAST BIOPSY Right 03/24/2022   MRI   BREAST EXCISIONAL BIOPSY Bilateral left 10/15/2002;   right 1997   left ductal system excision (papilloma w/ florid epithelial hyperplasia)/  right benign lumpectomy   BREAST LUMPECTOMY WITH RADIOACTIVE SEED LOCALIZATION Left 06/20/2022   Procedure: LEFT BREAST LUMPECTOMY WITH RADIOACTIVE SEED LOCALIZATION;  Surgeon: Harriette Bouillon, MD;  Location: [facility];  Service: General;  Laterality: Left;   CARDIOVASCULAR STRESS TEST  04/27/2009   normal nuclear study w/ no ischemia/  normal LV function and wall motion , ef  66%   CHOLECYSTECTOMY N/A 03/13/2013   Procedure: LAPAROSCOPIC CHOLECYSTECTOMY;  Surgeon: Shelly Rubenstein, MD;  Location: [facility];  Service: General;  Laterality: N/A;   COLONOSCOPY  last one 11-08-2006   COLONOSCOPY N/A 05/20/2019   Procedure: COLONOSCOPY;  Surgeon: Charna Elizabeth, MD;  Location: [facility];  Service: Endoscopy;  Laterality: N/A;   CYSTOSCOPY N/A 05/08/2018   Procedure: CYSTOSCOPY;  Surgeon: Huel Cote, MD;  Location: [facility];  Service: Gynecology;  Laterality: N/A;   CYSTOSCOPY WITH RETROGRADE PYELOGRAM, URETEROSCOPY AND STENT PLACEMENT Right 05/18/2017   Procedure: CYSTOSCOPY WITH RETROGRADE PYELOGRAM, URETEROSCOPY AND STENT PLACEMENT;  Surgeon: Sebastian Ache, MD;  Location: [facility];  Service: Urology;  Laterality: Right;   DX LAPAROSCOPY W/ LYSIS ADHESIONS/  LASER VAPORIZATION OF CERVIX  06/25/2002   ESOPHAGOGASTRODUODENOSCOPY  04/25/2005   ESOPHAGOGASTRODUODENOSCOPY (EGD) WITH PROPOFOL N/A 05/20/2019    Procedure: ESOPHAGOGASTRODUODENOSCOPY (EGD) WITH PROPOFOL;  Surgeon: Charna Elizabeth, MD;  Location: [facility];  Service: Endoscopy;  Laterality: N/A;   EXPLORATORY LEFT THUMB REPAIR TENDON/ LACERATION  09/27/1999   EXTRACORPOREAL SHOCK WAVE LITHOTRIPSY Right 04/23/2017   Procedure: RIGHT EXTRACORPOREAL SHOCK WAVE LITHOTRIPSY (ESWL);  Surgeon: Sebastian Ache, MD;  Location: [facility];  Service: Urology;  Laterality: Right;   EXTRACORPOREAL SHOCK WAVE LITHOTRIPSY Right 04/23/2017   HOLMIUM LASER APPLICATION Right 05/18/2017   Procedure: HOLMIUM LASER APPLICATION;  Surgeon: Sebastian Ache, MD;  Location: [facility];  Service: Urology;  Laterality: Right;   LAPAROSCOPIC VAGINAL HYSTERECTOMY WITH SALPINGECTOMY Bilateral 05/08/2018   Procedure: LAPAROSCOPIC ASSISTED VAGINAL HYSTERECTOMY WITH SALPINGECTOMY,  I & D Sebaceous Left Labia;  Surgeon: Huel Cote, MD;  Location: [facility];  Service: Gynecology;  Laterality: Bilateral;   LAPAROSCOPY  07/30/2012   Procedure: LAPAROSCOPY DIAGNOSTIC;  Surgeon: Oliver Pila, MD;  Location: [facility];  Service: Gynecology;  Laterality: N/A;   POLYPECTOMY  05/20/2019   Procedure: POLYPECTOMY;  Surgeon: Charna Elizabeth, MD;  Location: [facility];  Service: Endoscopy;;   WISDOM TOOTH EXTRACTION      Allergies  Allergen Reactions   Aspirin Shortness Of Breath and Other (See Comments)    Angiodema   Bee Venom Hives    Face swelling and chest pain   Diclofenac Anaphylaxis   Naproxen Swelling    Facial swelling   Nsaids Anaphylaxis    Swelling of eyes mouth and throat difficulty breathing   Prednisone     Worsened depression, suicidal ideation    Terbinafine And Related     Worsened depression, suicidal ideation    Family History  Problem Relation Age of Onset   Diabetes Mother    Hypertension Mother    Arthritis Mother    Depression Mother    Alcohol abuse Father    Diabetes Father    Hypertension Father    Arthritis Father     Depression Father    Hearing loss Father    Depression Sister    Early death Maternal Grandfather    Heart attack Maternal Grandfather    Arthritis Maternal Grandmother    COPD Maternal Grandmother    Depression Maternal Grandmother    Heart disease Maternal Grandmother    COPD Paternal Grandfather    Heart attack Paternal Grandfather    Asthma Paternal Grandmother    Kidney disease Paternal Grandmother    Arthritis Paternal Grandmother    Cancer Paternal Grandmother    COPD Paternal Grandmother    Hearing loss Paternal Grandmother    Heart disease Paternal Grandmother  Hypertension Paternal Grandmother    Hyperlipidemia Paternal Grandmother    Stroke Paternal Grandmother    Heart attack Paternal Grandmother    Schizophrenia Son    COPD Son    Drug abuse Son    Emphysema Other    Tongue cancer Other    Coronary artery disease Other    Breast cancer Neg Hx     Social History   Socioeconomic History   Marital status: Legally Separated    Spouse name: Not on file   Number of children: 2   Years of education: Not on file   Highest education level: Not on file  Occupational History   Occupation: Unemployed  Tobacco Use   Smoking status: Some Days    Current packs/day: 0.50    Average packs/day: 0.5 packs/day for 25.0 years (12.5 ttl pk-yrs)    Types: Cigarettes   Smokeless tobacco: Never   Tobacco comments:    6-7 cig. daily  Vaping Use   Vaping status: Never Used  Substance and Sexual Activity   Alcohol use: No   Drug use: No    Types: Cocaine    Comment: last cocaine use 2014   Sexual activity: Not Currently    Partners: Male    Birth control/protection: None  Other Topics Concern   Not on file  Social History Narrative   She lives at home with her spouse. She does not exercise.   She currently denies recent recreational drug use.   Social Determinants of Health   Financial Resource Strain: High Risk (08/16/2023)   Overall Financial Resource Strain  (CARDIA)    Difficulty of Paying Living Expenses: Hard  Food Insecurity: No Food Insecurity (08/16/2023)   Hunger Vital Sign    Worried About Running Out of Food in the Last Year: Never true    Ran Out of Food in the Last Year: Never true  Transportation Needs: Unmet Transportation Needs (08/16/2023)   PRAPARE - Transportation    Lack of Transportation (Medical): Yes    Lack of Transportation (Non-Medical): Yes  Physical Activity: Inactive (08/16/2023)   Exercise Vital Sign    Days of Exercise per Week: 0 days    Minutes of Exercise per Session: 0 min  Stress: Stress Concern Present (08/16/2023)   [employer] of Occupational Health - Occupational Stress Questionnaire    Feeling of Stress : Rather much  Social Connections: Socially Isolated (06/01/2021)   Social Connection and Isolation Panel [NHANES]    Frequency of Communication with Friends and Family: Once a week    Frequency of Social Gatherings with Friends and Family: Never    Attends Religious Services: Never    Database administrator or Organizations: No    Attends Engineer, structural: Never    Marital Status: Separated   Today's Vitals   08/28/23 0704  BP: 128/84  Pulse: 70  Resp: 12  Temp: 98.2 F (36.8 C)  TempSrc: Oral  SpO2: 98%  Weight: 185 lb 2 oz (84 kg)  Height: 5\' 7"  (1.702 m)   Body mass index is 28.99 kg/m.  Wt Readings from Last 3 Encounters:  08/16/23 183 lb (83 kg)  06/06/23 181 lb (82.1 kg)  02/13/23 188 lb 2 oz (85.3 kg)   Physical Exam Vitals and nursing note reviewed.  Constitutional:      General: She is not in acute distress.    Appearance: She is well-developed.  HENT:     Head: Normocephalic and atraumatic.  Right Ear: Hearing, tympanic membrane, ear canal and external ear normal.     Left Ear: Hearing, tympanic membrane, ear canal and external ear normal.     Mouth/Throat:     Mouth: Mucous membranes are moist.     Pharynx: Oropharynx is clear. Uvula midline.   Eyes:     Extraocular Movements: Extraocular movements intact.     Conjunctiva/sclera: Conjunctivae normal.     Pupils: Pupils are equal, round, and reactive to light.  Neck:     Thyroid: No thyroid mass.  Cardiovascular:     Rate and Rhythm: Normal rate and regular rhythm.     Pulses:          Dorsalis pedis pulses are 2+ on the right side and 2+ on the left side.     Heart sounds: No murmur heard. Pulmonary:     Effort: Pulmonary effort is normal. No respiratory distress.     Breath sounds: Normal breath sounds.  Abdominal:     Palpations: Abdomen is soft. There is no hepatomegaly or mass.     Tenderness: There is no abdominal tenderness.  Genitourinary:    Comments: Deferred to gyn. Musculoskeletal:     Right lower leg: No edema.     Left lower leg: No edema.     Comments: No major deformity or signs of synovitis appreciated.  Lymphadenopathy:     Cervical: No cervical adenopathy.     Upper Body:     Right upper body: No supraclavicular adenopathy.     Left upper body: No supraclavicular adenopathy.  Skin:    General: Skin is warm.     Findings: No erythema or rash.  Neurological:     General: No focal deficit present.     Mental Status: She is alert and oriented to person, place, and time.     Cranial Nerves: No cranial nerve deficit.     Coordination: Coordination normal.     Gait: Gait normal.     Deep Tendon Reflexes:     Reflex Scores:      Bicep reflexes are 2+ on the right side and 2+ on the left side.      Patellar reflexes are 2+ on the right side and 2+ on the left side. Psychiatric:        Mood and Affect: Mood is anxious and depressed. Affect is not labile or tearful.        Thought Content: Thought content does not include homicidal or suicidal ideation.   ASSESSMENT AND PLAN:  [name] [name] was here today for her annual physical examination.  Orders Placed This Encounter  Procedures   Flu vaccine trivalent PF, 6mos and  older(Flulaval,Afluria,Fluarix,Fluzone)   CBC   Lab Results  Component Value Date   NA 139 08/28/2023   CL 107 08/28/2023   K 3.7 08/28/2023   CO2 24 08/28/2023   BUN 15 08/28/2023   CREATININE 0.84 08/28/2023   GFR 81.31 08/28/2023   CALCIUM 8.4 08/28/2023   ALBUMIN 3.9 12/29/2022   GLUCOSE 109 (H) 08/28/2023   Lab Results  Component Value Date   VITAMINB12 >1501 (H) 08/28/2023   Routine general medical examination at a health care facility Assessment & Plan: We discussed the importance of regular physical activity and healthy diet for prevention of chronic illness and/or complications. Preventive guidelines reviewed. Vaccination up to date. Continue her female preventive care with gynecologist, last visit 11/2022. S/P hysterectomy. Next CPE in a year   Essential hypertension  Assessment & Plan: BP mildly elevated today, she is reporting elevated DBP at home. She is going to pick up losartan 25 mg today, BMP in 7 to 10 days. Continue propranolol 40 mg twice daily. Continue monitoring BP regularly. Follow-up in 3 months.  Orders: -     Basic metabolic panel -     CBC; Future -     Losartan Potassium; Take 1 tablet (25 mg total) by mouth daily.  Dispense: 90 tablet; Refill: 0  Fibromyalgia Assessment & Plan: Problem is otherwise stable. Stressed the importance of regular, low impact exercise and good sleep hygiene. Continue Lyrica 300 mg twice daily. Prescription sent to a different pharmacy, she has not want to continue with mail pharmacy. PDMP reviewed.  Orders: -     Pregabalin; Take 1 capsule (300 mg total) by mouth 2 (two) times daily.  Dispense: 60 capsule; Refill: 2  B12 deficiency Assessment & Plan: Today after verbal consent B12 1000 mcg IM given x 1 and continue q 2-3 months.  Orders: -     Vitamin B12 -     Cyanocobalamin  Need for influenza vaccination -     Flu vaccine trivalent PF, 6mos and older(Flulaval,Afluria,Fluarix,Fluzone)  Return in  3 months (on 11/28/2023) for chronic problems.  I, Suanne Marker, acting as a scribe for Vanita Cannell [name], MD., have documented all relevant documentation on the behalf of Floyde Dingley [name], MD, as directed by  Kendyll Huettner [name], MD while in the presence of Luian Schumpert [name], MD.   I, Suanne Marker, have reviewed all documentation for this visit. The documentation on 08/27/23 for the exam, diagnosis, procedures, and orders are all accurate and complete.  Jaysion Ramseyer G. [name], MD  [facility] [place] office.

## 2023-08-28 ENCOUNTER — Ambulatory Visit (INDEPENDENT_AMBULATORY_CARE_PROVIDER_SITE_OTHER): Payer: Medicare HMO | Admitting: Family Medicine

## 2023-08-28 ENCOUNTER — Encounter: Payer: Self-pay | Admitting: Family Medicine

## 2023-08-28 VITALS — BP 128/84 | HR 70 | Temp 98.2°F | Resp 12 | Ht 67.0 in | Wt 185.1 lb

## 2023-08-28 DIAGNOSIS — E538 Deficiency of other specified B group vitamins: Secondary | ICD-10-CM | POA: Diagnosis not present

## 2023-08-28 DIAGNOSIS — M797 Fibromyalgia: Secondary | ICD-10-CM | POA: Diagnosis not present

## 2023-08-28 DIAGNOSIS — I10 Essential (primary) hypertension: Secondary | ICD-10-CM

## 2023-08-28 DIAGNOSIS — Z23 Encounter for immunization: Secondary | ICD-10-CM

## 2023-08-28 DIAGNOSIS — Z0000 Encounter for general adult medical examination without abnormal findings: Secondary | ICD-10-CM | POA: Insufficient documentation

## 2023-08-28 LAB — BASIC METABOLIC PANEL
BUN: 15 mg/dL (ref 6–23)
CO2: 24 meq/L (ref 19–32)
Calcium: 8.4 mg/dL (ref 8.4–10.5)
Chloride: 107 meq/L (ref 96–112)
Creatinine, Ser: 0.84 mg/dL (ref 0.40–1.20)
GFR: 81.31 mL/min (ref >60.00)
Glucose, Bld: 109 mg/dL — ABNORMAL HIGH (ref 70–99)
Potassium: 3.7 meq/L (ref 3.5–5.1)
Sodium: 139 meq/L (ref 135–145)

## 2023-08-28 LAB — VITAMIN B12: Vitamin B-12: >1501 pg/mL — ABNORMAL HIGH (ref 211–911)

## 2023-08-28 LAB — CBC
HCT: 43.3 % (ref 36.0–46.0)
Hemoglobin: 13.9 g/dL (ref 12.0–15.0)
MCHC: 32.1 g/dL (ref 30.0–36.0)
MCV: 88.7 fL (ref 78.0–100.0)
Platelets: 323 10*3/uL (ref 150.0–400.0)
RBC: 4.88 Mil/uL (ref 3.87–5.11)
RDW: 12.9 % (ref 11.5–15.5)
WBC: 9.7 10*3/uL (ref 4.0–10.5)

## 2023-08-28 MED ORDER — PREGABALIN 300 MG PO CAPS: 300.0000 mg | ORAL_CAPSULE | Freq: Two times a day (BID) | ORAL | 2 refills | Status: DC

## 2023-08-28 MED ORDER — LOSARTAN POTASSIUM 25 MG PO TABS: 25.0000 mg | ORAL_TABLET | Freq: Every day | ORAL | 0 refills | Status: DC

## 2023-08-28 MED ORDER — CYANOCOBALAMIN 1000 MCG/ML IJ SOLN
1000.0000 ug | Freq: Once | INTRAMUSCULAR | Status: AC
Start: 2023-08-28 — End: 2023-08-28
  Administered 2023-08-28: 1000 ug via INTRAMUSCULAR

## 2023-08-28 NOTE — Assessment & Plan Note (Signed)
BP mildly elevated today, she is reporting elevated DBP at home. She is going to pick up losartan 25 mg today, BMP in 7 to 10 days. Continue propranolol 40 mg twice daily. Continue monitoring BP regularly. Follow-up in 3 months.

## 2023-08-28 NOTE — Patient Instructions (Addendum)
A few things to remember from today's visit:  Routine general medical examination at a health care facility  Essential hypertension - Plan: Basic metabolic panel, CBC, losartan (COZAAR) 25 MG tablet  Fibromyalgia - Plan: pregabalin (LYRICA) 300 MG capsule  B12 deficiency - Plan: Vitamin B12  Start Losartan and labs 7-10 days after.  If you need refills for medications you take chronically, please call your pharmacy. Do not use My Chart to request refills or for acute issues that need immediate attention. If you send a my chart message, it may take a few days to be addressed, specially if I am not in the office.  Please be sure medication list is accurate. If a new problem present, please set up appointment sooner than planned today.  Health Maintenance, Female Adopting a healthy lifestyle and getting preventive care are important in promoting health and wellness. Ask your health care provider about: The right schedule for you to have regular tests and exams. Things you can do on your own to prevent diseases and keep yourself healthy. What should I know about diet, weight, and exercise? Eat a healthy diet  Eat a diet that includes plenty of vegetables, fruits, low-fat dairy products, and lean protein. Do not eat a lot of foods that are high in solid fats, added sugars, or sodium. Maintain a healthy weight Body mass index (BMI) is used to identify weight problems. It estimates body fat based on height and weight. Your health care provider can help determine your BMI and help you achieve or maintain a healthy weight. Get regular exercise Get regular exercise. This is one of the most important things you can do for your health. Most adults should: Exercise for at least 150 minutes each week. The exercise should increase your heart rate and make you sweat (moderate-intensity exercise). Do strengthening exercises at least twice a week. This is in addition to the moderate-intensity  exercise. Spend less time sitting. Even light physical activity can be beneficial. Watch cholesterol and blood lipids Have your blood tested for lipids and cholesterol at 50 years of age, then have this test every 5 years. Have your cholesterol levels checked more often if: Your lipid or cholesterol levels are high. You are older than 50 years of age. You are at high risk for heart disease. What should I know about cancer screening? Depending on your health history and family history, you may need to have cancer screening at various ages. This may include screening for: Breast cancer. Cervical cancer. Colorectal cancer. Skin cancer. Lung cancer. What should I know about heart disease, diabetes, and high blood pressure? Blood pressure and heart disease High blood pressure causes heart disease and increases the risk of stroke. This is more likely to develop in people who have high blood pressure readings or are overweight. Have your blood pressure checked: Every 3-5 years if you are 14-79 years of age. Every year if you are 9 years old or older. Diabetes Have regular diabetes screenings. This checks your fasting blood sugar level. Have the screening done: Once every three years after age 43 if you are at a normal weight and have a low risk for diabetes. More often and at a younger age if you are overweight or have a high risk for diabetes. What should I know about preventing infection? Hepatitis B If you have a higher risk for hepatitis B, you should be screened for this virus. Talk with your health care provider to find out if you are at  risk for hepatitis B infection. Hepatitis C Testing is recommended for: Everyone born from [birth year] through 1963. Anyone with known risk factors for hepatitis C. Sexually transmitted infections (STIs) Get screened for STIs, including gonorrhea and chlamydia, if: You are sexually active and are younger than 50 years of age. You are older than 50 years  of age and your health care provider tells you that you are at risk for this type of infection. Your sexual activity has changed since you were last screened, and you are at increased risk for chlamydia or gonorrhea. Ask your health care provider if you are at risk. Ask your health care provider about whether you are at high risk for HIV. Your health care provider may recommend a prescription medicine to help prevent HIV infection. If you choose to take medicine to prevent HIV, you should first get tested for HIV. You should then be tested every 3 months for as long as you are taking the medicine. Pregnancy If you are about to stop having your period (premenopausal) and you may become pregnant, seek counseling before you get pregnant. Take 400 to 800 micrograms (mcg) of folic acid every day if you become pregnant. Ask for birth control (contraception) if you want to prevent pregnancy. Osteoporosis and menopause Osteoporosis is a disease in which the bones lose minerals and strength with aging. This can result in bone fractures. If you are 65 years old or older, or if you are at risk for osteoporosis and fractures, ask your health care provider if you should: Be screened for bone loss. Take a calcium or vitamin D supplement to lower your risk of fractures. Be given hormone replacement therapy (HRT) to treat symptoms of menopause. Follow these instructions at home: Alcohol use Do not drink alcohol if: Your health care provider tells you not to drink. You are pregnant, may be pregnant, or are planning to become pregnant. If you drink alcohol: Limit how much you have to: 0-1 drink a day. Know how much alcohol is in your drink. In the [place] one drink equals one 12 oz bottle of beer (355 mL), one 5 oz glass of wine (148 mL), or one 1 oz glass of hard liquor (44 mL). Lifestyle Do not use any products that contain nicotine or tobacco. These products include cigarettes, chewing tobacco, and vaping  devices, such as e-cigarettes. If you need help quitting, ask your health care provider. Do not use street drugs. Do not share needles. Ask your health care provider for help if you need support or information about quitting drugs. General instructions Schedule regular health, dental, and eye exams. Stay current with your vaccines. Tell your health care provider if: You often feel depressed. You have ever been abused or do not feel safe at home. Summary Adopting a healthy lifestyle and getting preventive care are important in promoting health and wellness. Follow your health care provider's instructions about healthy diet, exercising, and getting tested or screened for diseases. Follow your health care provider's instructions on monitoring your cholesterol and blood pressure. This information is not intended to replace advice given to you by your health care provider. Make sure you discuss any questions you have with your health care provider. Document Revised: 03/28/2021 Document Reviewed: 03/28/2021 Elsevier Patient Education  2024 [employer].

## 2023-08-28 NOTE — Assessment & Plan Note (Signed)
Problem is otherwise stable. Stressed the importance of regular, low impact exercise and good sleep hygiene. Continue Lyrica 300 mg twice daily. Prescription sent to a different pharmacy, she has not want to continue with mail pharmacy. PDMP reviewed.

## 2023-08-28 NOTE — Assessment & Plan Note (Signed)
Today after verbal consent B12 1000 mcg IM given x 1 and continue q 2-3 months.

## 2023-08-28 NOTE — Assessment & Plan Note (Addendum)
We discussed the importance of regular physical activity and healthy diet for prevention of chronic illness and/or complications. Preventive guidelines reviewed. Vaccination up to date. Continue her female preventive care with gynecologist, last visit 11/2022. S/P hysterectomy. Next CPE in a year

## 2023-08-29 ENCOUNTER — Telehealth: Payer: Self-pay | Admitting: Family Medicine

## 2023-08-29 ENCOUNTER — Encounter: Payer: Self-pay | Admitting: Family Medicine

## 2023-08-29 ENCOUNTER — Telehealth: Payer: Medicare HMO | Admitting: Family Medicine

## 2023-08-29 DIAGNOSIS — F332 Major depressive disorder, recurrent severe without psychotic features: Secondary | ICD-10-CM

## 2023-08-29 IMAGING — MR MR BREAST BILAT WO/W CM
8 of 13 series · 28 of 48 positions shown · IV contrast (gadavist)
Comparison: Previous exams.
COMPARISON: Previous exams.

Addendum:
CLINICAL DATA: 42-year-old female with strong family history of
breast cancer including mother having been diagnosed with breast
cancer at age 70. Prior benign remote bilateral breast biopsies. The
patient has had bilateral nonfocal breast pain for approximately 6
months as well as clear spontaneous right nipple discharge also for
approximately 6 months.
TECHNIQUE: Multiplanar, multisequence MR images of both breasts were obtained
prior to and following the intravenous administration of ml of
Gadavist
CLINICAL DATA: 50-year-old female with strong family history of

*** End of Addendum ***

[Series 2: t2_tirm_tra ipat (a-p) · axial · 3.0mm · 0.78mm/px · 1 of 55 slices shown]
[im 1/55]
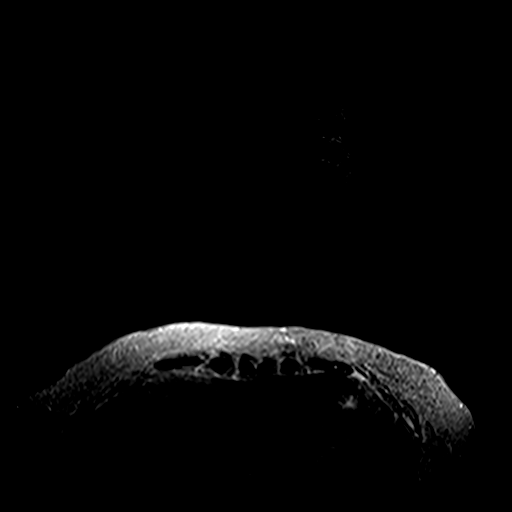

[Series 3: fl3d pre-cm no · axial · non-contrast · 1.2mm · 1.04mm/px · z∈[-100,+72]mm · 5 of 144 slices shown]
[im 1/144]
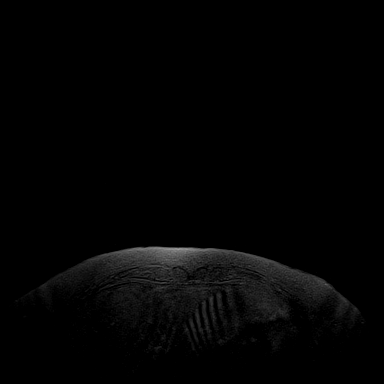
[im 36/144]
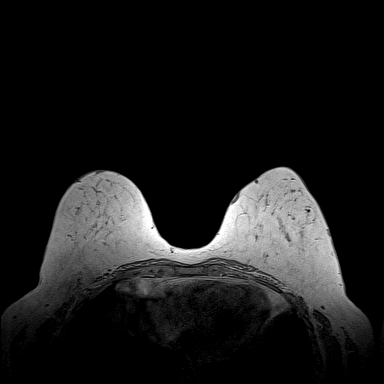
[im 72/144]
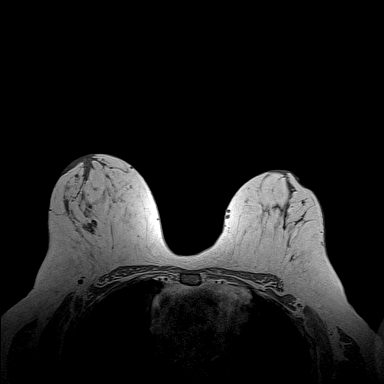
[im 108/144]
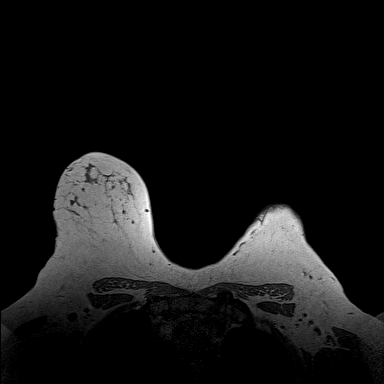
[im 144/144]
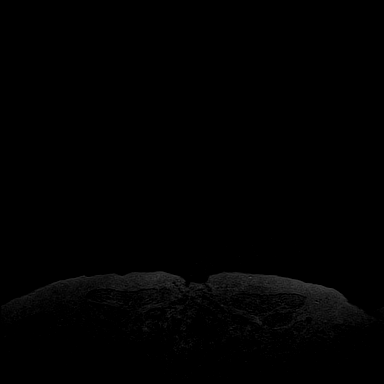

[Series 4: fl3d pre-cm · axial · non-contrast · 1.2mm · 1.04mm/px · z∈[-100,+72]mm · 5 of 144 slices shown]
[im 1/144]
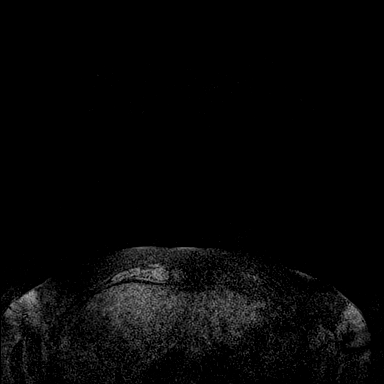
[im 36/144]
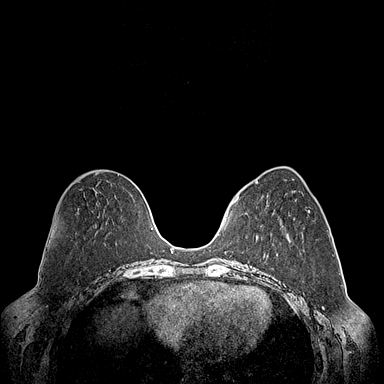
[im 72/144]
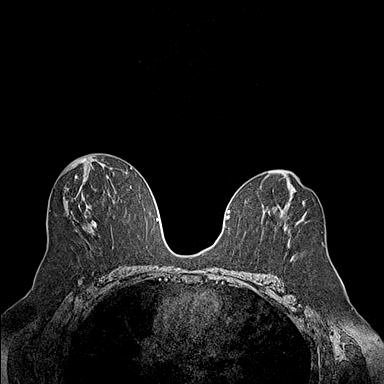
[im 108/144]
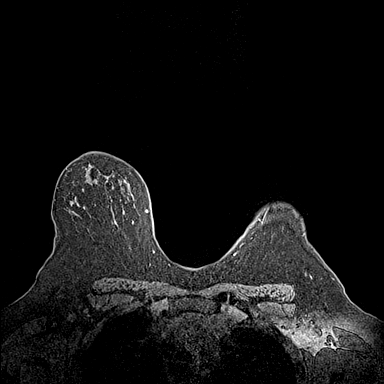
[im 144/144]
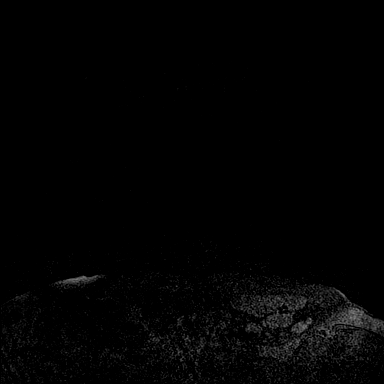

[Series 5: fl3d post-cm 20 · axial · 1.2mm · 1.04mm/px · z∈[-100,+72]mm · 5 of 144 slices shown (1 of 3)]
[im 1/144]
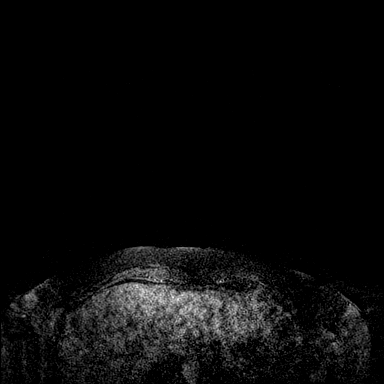
[im 36/144]
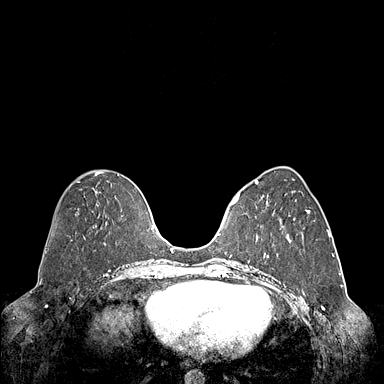
[im 72/144]
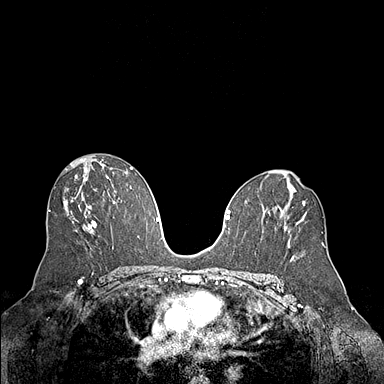
[im 108/144]
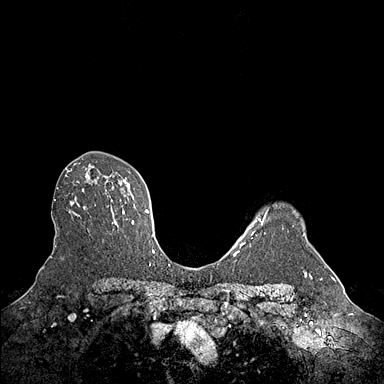
[im 144/144]
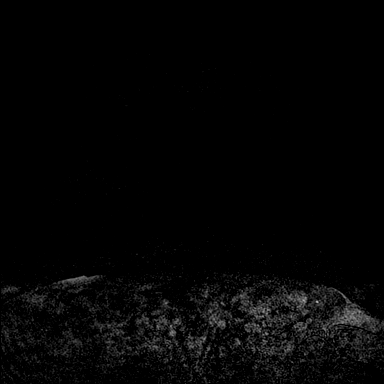

[Series 6: fl3d post-cm 20 · axial · 1.2mm · 1.04mm/px · z∈[-100,+72]mm · 5 of 144 slices shown (2 of 3)]
[im 1/144]
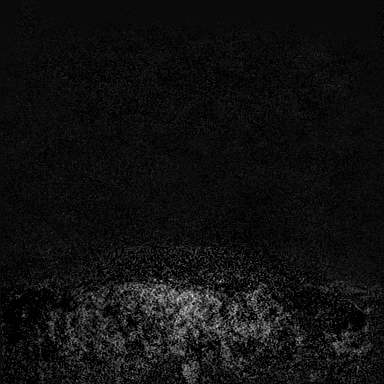
[im 36/144]
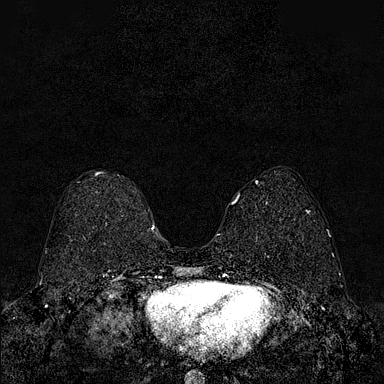
[im 72/144]
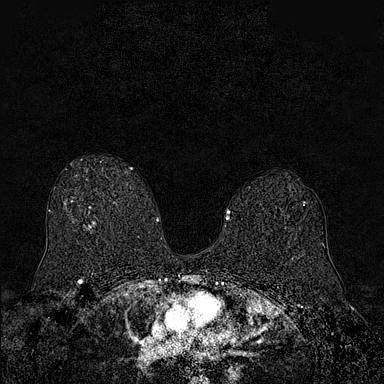
[im 108/144]
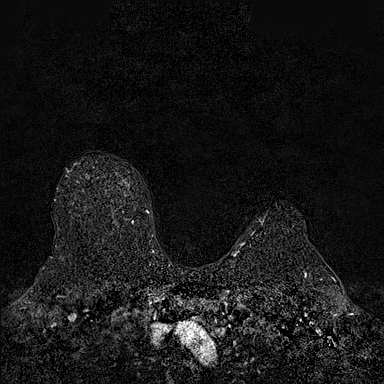
[im 144/144]
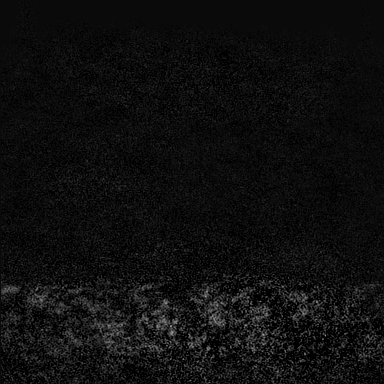

[Series 7: fl3d post-cm 20 · axial · 172.8mm · 1.04mm/px · 1 of 1 slices shown (3 of 3)]
[im 1/1]
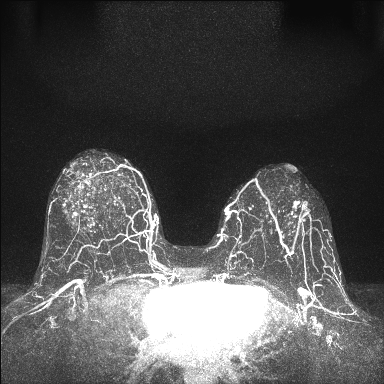

[Series 8: fl3d post-cm 3 · axial · 1.2mm · 1.04mm/px · z∈[-100,+72]mm · 5 of 144 slices shown (1 of 2)]
[im 1/144]
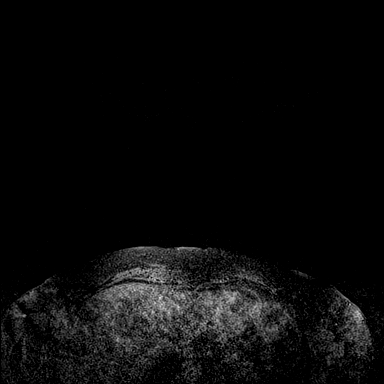
[im 36/144]
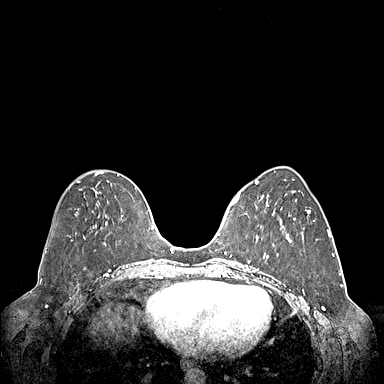
[im 72/144]
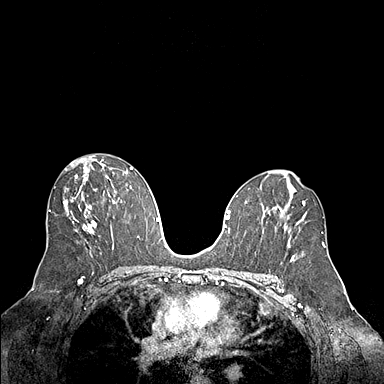
[im 108/144]
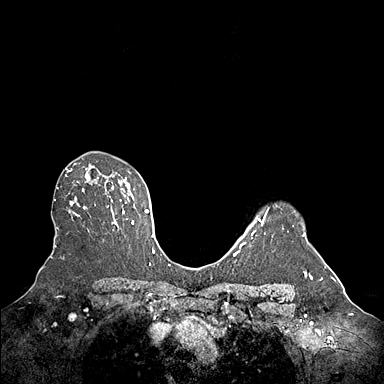
[im 144/144]
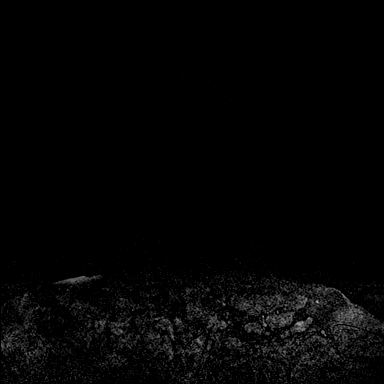

[Series 9: fl3d post-cm 3 · axial · 1.2mm · 1.04mm/px · 1 of 144 slices shown (2 of 2)]
[im 1/144]
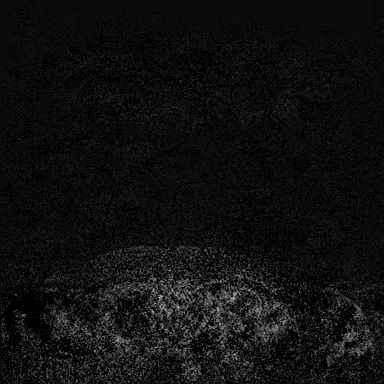

[28 of 48 positions shown; findings below may reference images not displayed]

Recent diagnostic mammography and ultrasound 01/13/2022 demonstrated
an indeterminate 1.4 cm mass at the 1 o'clock position of the left
breast with ultrasound-guided core biopsy recommended. In addition,
breast MRI was recommended to further evaluate history of clear
right nipple discharge.

EXAM:
BILATERAL BREAST MRI WITH AND WITHOUT CONTRAST
Three-dimensional MR images were rendered by post-processing of the
original MR data on an independent workstation. The
three-dimensional MR images were interpreted, and findings are
reported in the following complete MRI report for this study. Three
dimensional images were evaluated at the independent interpreting
workstation using the DynaCAD thin client.
FINDINGS: Breast composition: b.  Scattered fibroglandular tissue.

Background parenchymal enhancement: Moderate to marked.

Right breast: There is linear oriented nodular enhancement/small
enhancing masses spanning 3.5 cm in the upper-outer right breast
(images [id number]). These masses appear more discrete when compared to
the patient's background nodular enhancement pattern.

Left breast: There is an irregular enhancing 0.9 cm mass in the
upper-outer left breast at the approximate 1 o'clock position (image
79). This is felt to correspond well with the mass seen at the 1
o'clock position in the left breast on recent ultrasound. There is
background nodular enhancement pattern, however no additional
discrete suspicious enhancing masses or abnormal areas of
enhancement.

Lymph nodes: No abnormal appearing lymph nodes.

Ancillary findings:  None.
IMPRESSION: 1. Indeterminate linear oriented nodular enhancement/small enhancing
masses spanning 3.5 cm in the upper-outer right breast.

2. Suspicious 0.9 cm enhancing mass in the upper-outer left breast.
This corresponds well with the mass seen on recent left breast
ultrasound dated 01/13/2022.

RECOMMENDATION:
1. Recommend MRI guided biopsy of the anterior and posterior
portions of the linear nodular enhancement in the upper-outer right
breast.

2. Recommend ultrasound-guided core biopsy of the 1.4 cm mass at the
1 o'clock position of the left breast seen on recent ultrasound.

BI-RADS CATEGORY  4: Suspicious.

ADDENDUM:
An addendum is made to the clinical data portion of the exam due to
a typographical error.

This should read as:
Recent diagnostic mammography and ultrasound 01/13/2022 demonstrated
an indeterminate 1.4 cm mass at the 1 o'clock position of the left
breast with ultrasound-guided core biopsy recommended. In addition,
breast MRI was recommended to further evaluate history of clear
right nipple discharge.

EXAM:
BILATERAL BREAST MRI WITH AND WITHOUT CONTRAST
Three-dimensional MR images were rendered by post-processing of the
original MR data on an independent workstation. The
three-dimensional MR images were interpreted, and findings are
reported in the following complete MRI report for this study. Three
dimensional images were evaluated at the independent interpreting
workstation using the DynaCAD thin client.
FINDINGS: Breast composition: b.  Scattered fibroglandular tissue.

Background parenchymal enhancement: Moderate to marked.

Right breast: There is linear oriented nodular enhancement/small
enhancing masses spanning 3.5 cm in the upper-outer right breast
(images [id number]). These masses appear more discrete when compared to
the patient's background nodular enhancement pattern.

Left breast: There is an irregular enhancing 0.9 cm mass in the
upper-outer left breast at the approximate 1 o'clock position (image
79). This is felt to correspond well with the mass seen at the 1
o'clock position in the left breast on recent ultrasound. There is
background nodular enhancement pattern, however no additional
discrete suspicious enhancing masses or abnormal areas of
enhancement.

Lymph nodes: No abnormal appearing lymph nodes.

Ancillary findings:  None.
IMPRESSION: 1. Indeterminate linear oriented nodular enhancement/small enhancing
masses spanning 3.5 cm in the upper-outer right breast.

2. Suspicious 0.9 cm enhancing mass in the upper-outer left breast.
This corresponds well with the mass seen on recent left breast
ultrasound dated 01/13/2022.

RECOMMENDATION:
1. Recommend MRI guided biopsy of the anterior and posterior
portions of the linear nodular enhancement in the upper-outer right
breast.

2. Recommend ultrasound-guided core biopsy of the 1.4 cm mass at the
1 o'clock position of the left breast seen on recent ultrasound.

BI-RADS CATEGORY  4: Suspicious.

## 2023-08-29 NOTE — Telephone Encounter (Signed)
Pt was just seen by MD on 08/28/23.  Pt states she is not doing very well and would like to request a referral to the:  [facility] in [place].  Please place referral, at your earliest convenience.

## 2023-08-29 NOTE — Progress Notes (Signed)
[place]   Needs referral for [facility], PCP has placed this already. Did provide info on [facility]   Patient acknowledged agreement and understanding of the plan.

## 2023-08-29 NOTE — Telephone Encounter (Signed)
Referral placed.

## 2023-08-30 ENCOUNTER — Encounter: Payer: Self-pay | Admitting: Family Medicine

## 2023-08-30 DIAGNOSIS — I10 Essential (primary) hypertension: Secondary | ICD-10-CM

## 2023-08-30 DIAGNOSIS — R000 Tachycardia, unspecified: Secondary | ICD-10-CM

## 2023-08-31 MED ORDER — PROPRANOLOL HCL 40 MG PO TABS: 40.0000 mg | ORAL_TABLET | Freq: Two times a day (BID) | ORAL | 3 refills | Status: DC

## 2023-08-31 MED ORDER — OMEPRAZOLE 40 MG PO CPDR: 40.0000 mg | DELAYED_RELEASE_CAPSULE | Freq: Every day | ORAL | 3 refills | Status: DC
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
## 2023-09-01 ENCOUNTER — Encounter (HOSPITAL_COMMUNITY): Payer: Self-pay | Admitting: Psychiatry

## 2023-09-01 ENCOUNTER — Ambulatory Visit (HOSPITAL_BASED_OUTPATIENT_CLINIC_OR_DEPARTMENT_OTHER): Payer: Medicare HMO | Admitting: Psychiatry

## 2023-09-01 DIAGNOSIS — F0630 Mood disorder due to known physiological condition, unspecified: Secondary | ICD-10-CM | POA: Diagnosis not present

## 2023-09-01 DIAGNOSIS — F4310 Post-traumatic stress disorder, unspecified: Secondary | ICD-10-CM

## 2023-09-01 DIAGNOSIS — G4700 Insomnia, unspecified: Secondary | ICD-10-CM | POA: Diagnosis not present

## 2023-09-01 MED ORDER — PRAZOSIN HCL 1 MG PO CAPS: 1.0000 mg | ORAL_CAPSULE | Freq: Every day | ORAL | 0 refills | Status: DC

## 2023-09-01 MED ORDER — LAMOTRIGINE 25 MG PO TABS: ORAL_TABLET | ORAL | 0 refills | Status: DC

## 2023-09-01 MED ORDER — PRAZOSIN HCL 2 MG PO CAPS: 2.0000 mg | ORAL_CAPSULE | Freq: Every day | ORAL | 1 refills | Status: DC

## 2023-09-01 NOTE — Patient Instructions (Addendum)
Start lamotrigine 25 mg daily for two weeks, then 50 mg daily   Start prazosin 1 mg at night for 3 days, then 2 mg at night  Next appointment- 12/2 at 9 am

## 2023-09-06 DIAGNOSIS — F4312 Post-traumatic stress disorder, chronic: Secondary | ICD-10-CM | POA: Diagnosis not present

## 2023-09-11 ENCOUNTER — Telehealth: Payer: Self-pay | Admitting: Psychiatry

## 2023-09-11 NOTE — Telephone Encounter (Signed)
Received record from [facility]. [name] [name]. Diagnosis includes PTSD, borderline personality disorder, generalized anxiety disorder. Records will be scanned.

## 2023-09-12 ENCOUNTER — Ambulatory Visit: Payer: Medicare HMO | Admitting: Diagnostic Neuroimaging

## 2023-09-13 ENCOUNTER — Other Ambulatory Visit (INDEPENDENT_AMBULATORY_CARE_PROVIDER_SITE_OTHER): Payer: Medicare HMO

## 2023-09-13 DIAGNOSIS — I10 Essential (primary) hypertension: Secondary | ICD-10-CM

## 2023-09-13 LAB — BASIC METABOLIC PANEL
BUN: 13 mg/dL (ref 6–23)
CO2: 27 meq/L (ref 19–32)
Calcium: 8.6 mg/dL (ref 8.4–10.5)
Chloride: 106 meq/L (ref 96–112)
Creatinine, Ser: 0.74 mg/dL (ref 0.40–1.20)
GFR: 94.64 mL/min (ref >60.00)
Glucose, Bld: 132 mg/dL — ABNORMAL HIGH (ref 70–99)
Potassium: 3.8 meq/L (ref 3.5–5.1)
Sodium: 137 meq/L (ref 135–145)

## 2023-09-20 DIAGNOSIS — F4312 Post-traumatic stress disorder, chronic: Secondary | ICD-10-CM | POA: Diagnosis not present

## 2023-09-28 ENCOUNTER — Other Ambulatory Visit: Payer: Self-pay | Admitting: Medical Genetics

## 2023-09-28 DIAGNOSIS — Z006 Encounter for examination for normal comparison and control in clinical research program: Secondary | ICD-10-CM

## 2023-10-01 ENCOUNTER — Telehealth: Payer: Self-pay

## 2023-10-01 NOTE — Telephone Encounter (Signed)
pt left a message that she needs a refill on the prazosin. pt was last seen on 10-12 next appt 12-2.

## 2023-10-01 NOTE — Telephone Encounter (Signed)
called pharmacy because pt should have another refill left on the prazosin 2mg .   pt has another refill they will get ready for today.

## 2023-10-01 NOTE — Telephone Encounter (Signed)
pt notified that rx was already at the pharmacy and that they are going ahead and get if filled today. check with pharmacy later today

## 2023-10-03 ENCOUNTER — Encounter: Payer: Self-pay | Admitting: Psychiatry

## 2023-10-04 DIAGNOSIS — F4312 Post-traumatic stress disorder, chronic: Secondary | ICD-10-CM | POA: Diagnosis not present

## 2023-10-15 DIAGNOSIS — F4312 Post-traumatic stress disorder, chronic: Secondary | ICD-10-CM | POA: Diagnosis not present
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
## 2023-10-22 ENCOUNTER — Encounter: Payer: Self-pay | Admitting: Psychiatry

## 2023-10-22 ENCOUNTER — Ambulatory Visit: Payer: Medicare HMO | Admitting: Psychiatry

## 2023-10-22 VITALS — BP 128/82 | HR 87 | Temp 97.6°F | Ht 67.0 in | Wt 196.6 lb

## 2023-10-22 DIAGNOSIS — G4700 Insomnia, unspecified: Secondary | ICD-10-CM

## 2023-10-22 DIAGNOSIS — F0630 Mood disorder due to known physiological condition, unspecified: Secondary | ICD-10-CM | POA: Diagnosis not present

## 2023-10-22 DIAGNOSIS — F4310 Post-traumatic stress disorder, unspecified: Secondary | ICD-10-CM

## 2023-10-22 MED ORDER — LAMOTRIGINE 100 MG PO TABS: 100.0000 mg | ORAL_TABLET | Freq: Every day | ORAL | 1 refills | Status: DC

## 2023-10-22 MED ORDER — PRAZOSIN HCL 2 MG PO CAPS: 2.0000 mg | ORAL_CAPSULE | Freq: Every day | ORAL | 1 refills | Status: DC

## 2023-10-22 MED ORDER — PRAZOSIN HCL 1 MG PO CAPS: 1.0000 mg | ORAL_CAPSULE | Freq: Every day | ORAL | 1 refills | Status: DC

## 2023-10-22 NOTE — Patient Instructions (Signed)
Increase lamotrigine 100 mg daily  Increase prazosin, 1 mg in AM, 2 mg at night  Next appointment- 1/21 at 8 AM,

## 2023-10-29 ENCOUNTER — Ambulatory Visit: Payer: Medicare HMO | Admitting: Internal Medicine

## 2023-10-29 ENCOUNTER — Telehealth: Payer: Self-pay | Admitting: Internal Medicine

## 2023-10-29 ENCOUNTER — Encounter: Payer: Self-pay | Admitting: Internal Medicine

## 2023-10-29 VITALS — BP 130/80 | HR 80 | Ht 67.0 in | Wt 193.0 lb

## 2023-10-29 DIAGNOSIS — Z860101 Personal history of adenomatous and serrated colon polyps: Secondary | ICD-10-CM | POA: Diagnosis not present

## 2023-10-29 DIAGNOSIS — K6289 Other specified diseases of anus and rectum: Secondary | ICD-10-CM

## 2023-10-29 DIAGNOSIS — K581 Irritable bowel syndrome with constipation: Secondary | ICD-10-CM | POA: Diagnosis not present

## 2023-10-29 DIAGNOSIS — K644 Residual hemorrhoidal skin tags: Secondary | ICD-10-CM

## 2023-10-29 DIAGNOSIS — K648 Other hemorrhoids: Secondary | ICD-10-CM

## 2023-10-29 MED ORDER — DICYCLOMINE HCL 20 MG PO TABS: 20.0000 mg | ORAL_TABLET | Freq: Four times a day (QID) | ORAL | 5 refills | Status: AC | PRN

## 2023-10-29 MED ORDER — LINACLOTIDE 290 MCG PO CAPS: 290.0000 ug | ORAL_CAPSULE | Freq: Every day | ORAL | 11 refills | Status: AC

## 2023-10-29 NOTE — Telephone Encounter (Signed)
Please call her when she was here I was thinking I did not have any openings but I would like her to use a nurse visit in late January for follow-up

## 2023-10-29 NOTE — Telephone Encounter (Signed)
I have left Renee a detailed voicemail message that I made her an appointment for 12/17/2023 at 1:30pm for her IBS w/constipation.

## 2023-10-29 NOTE — Patient Instructions (Addendum)
  VISIT SUMMARY:  During today's visit, we discussed your ongoing issues with hemorrhoids, anal pain, constipation, abdominal pain, and recent weight gain. We reviewed your current medications and made some adjustments to help manage your symptoms more effectively.  YOUR PLAN:  -CHRONIC HEMORRHOIDS: Hemorrhoids are swollen veins in the lower rectum and anus that can cause discomfort and bleeding. Your recent anoscopy showed internal hemorrhoids and a small anal polyp. No changes to your current management plan are needed at this time.  -PROCTALGIA FUGAX: Proctalgia fugax is a condition characterized by sudden, severe episodes of anal pain. You may have this though it usually doesn't last hours. To help manage this pain, I have prescribed dicyclomine (Bentyl), which is an antispasmodic medication.  -CHRONIC CONSTIPATION: Chronic constipation involves infrequent bowel movements or difficulty passing stool. You will increase your Linzess dosage to 290mg  as needed during episodes of constipation to help alleviate your symptoms.  -RIGHT LOWER QUADRANT ABDOMINAL PAIN: This type of abdominal pain can be caused by various conditions. To help manage the pain, I have prescribed dicyclomine (Bentyl), which should help reduce the muscle spasms causing the discomfort.  INSTRUCTIONS:  Please message me via MyChart or call if your symptoms persist or worsen. If there is no improvement with the current plan, we may consider scheduling a colonoscopy. Otherwise routine colonoscopy 2027.  I appreciate the opportunity to care for you. Stan Head, MD, [facility]
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
## 2023-10-30 ENCOUNTER — Ambulatory Visit (INDEPENDENT_AMBULATORY_CARE_PROVIDER_SITE_OTHER): Payer: Medicare HMO | Admitting: Family Medicine

## 2023-10-30 ENCOUNTER — Encounter: Payer: Self-pay | Admitting: Family Medicine

## 2023-10-30 VITALS — BP 110/70 | HR 79 | Resp 12 | Ht 67.0 in | Wt 193.0 lb

## 2023-10-30 DIAGNOSIS — M5416 Radiculopathy, lumbar region: Secondary | ICD-10-CM | POA: Diagnosis not present

## 2023-10-30 DIAGNOSIS — R609 Edema, unspecified: Secondary | ICD-10-CM | POA: Diagnosis not present

## 2023-10-30 DIAGNOSIS — M79604 Pain in right leg: Secondary | ICD-10-CM

## 2023-10-30 DIAGNOSIS — G4733 Obstructive sleep apnea (adult) (pediatric): Secondary | ICD-10-CM | POA: Diagnosis not present

## 2023-10-30 DIAGNOSIS — M79605 Pain in left leg: Secondary | ICD-10-CM | POA: Diagnosis not present

## 2023-10-30 LAB — MICROALBUMIN / CREATININE URINE RATIO
Creatinine,U: 136.7 mg/dL
Microalb Creat Ratio: 0.5 mg/g (ref 0.0–30.0)
Microalb, Ur: <0.7 mg/dL (ref 0.0–1.9)

## 2023-10-30 LAB — BASIC METABOLIC PANEL
BUN: 19 mg/dL (ref 6–23)
CO2: 28 meq/L (ref 19–32)
Calcium: 8.5 mg/dL (ref 8.4–10.5)
Chloride: 109 meq/L (ref 96–112)
Creatinine, Ser: 0.8 mg/dL (ref 0.40–1.20)
GFR: 86.11 mL/min (ref >60.00)
Glucose, Bld: 102 mg/dL — ABNORMAL HIGH (ref 70–99)
Potassium: 4.2 meq/L (ref 3.5–5.1)
Sodium: 142 meq/L (ref 135–145)

## 2023-10-30 LAB — CK: Total CK: 34 U/L (ref 7–177)

## 2023-10-30 LAB — C-REACTIVE PROTEIN: CRP: <1 mg/dL (ref 0.5–20.0)

## 2023-10-30 NOTE — Patient Instructions (Addendum)
A few things to remember from today's visit:  OSA (obstructive sleep apnea) - Plan: Ambulatory referral to Pulmonology  Edema, unspecified type - Plan: Basic metabolic panel, Microalbumin / creatinine urine ratio, C-reactive protein  Pain in both lower extremities - Plan: CK, C-reactive protein  Lumbar radiculopathy  No edema appreciated today. Monitor for new symptoms. Appt with Dr Craige Cotta will be arranged.  If you need refills for medications you take chronically, please call your pharmacy. Do not use My Chart to request refills or for acute issues that need immediate attention. If you send a my chart message, it may take a few days to be addressed, specially if I am not in the office.  Please be sure medication list is accurate. If a new problem present, please set up appointment sooner than planned today.
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
## 2023-11-01 DIAGNOSIS — F4312 Post-traumatic stress disorder, chronic: Secondary | ICD-10-CM | POA: Diagnosis not present

## 2023-11-05 NOTE — Telephone Encounter (Signed)
I spoke with [name] [name] and she got the message about her appointment and said thank you.

## 2023-11-06 ENCOUNTER — Telehealth: Payer: Self-pay | Admitting: Family Medicine

## 2023-11-06 NOTE — Telephone Encounter (Signed)
Pt has history of breast issues. Pt would like diagnostic mammogram for breasts pain and pt felt something in left pain. [facility] [place]

## 2023-11-06 NOTE — Telephone Encounter (Signed)
Okay to order?

## 2023-11-09 NOTE — Telephone Encounter (Signed)
She had diagnostic mammogram in 05/2023 due to left breast pain, Bi-Rads 2. Recommend arranging an appointment with her gynecologist to address this problem. Thanks, BJ

## 2023-11-09 NOTE — Telephone Encounter (Signed)
I called and spoke with patient. She is already set to see her gynecologist in January so she will discuss with them then. Nothing further needed.

## 2023-11-26 ENCOUNTER — Other Ambulatory Visit: Payer: Self-pay | Admitting: Family Medicine

## 2023-11-26 DIAGNOSIS — M797 Fibromyalgia: Secondary | ICD-10-CM

## 2023-11-27 DIAGNOSIS — G5603 Carpal tunnel syndrome, bilateral upper limbs: Secondary | ICD-10-CM | POA: Diagnosis not present

## 2023-11-29 DIAGNOSIS — F4312 Post-traumatic stress disorder, chronic: Secondary | ICD-10-CM | POA: Diagnosis not present
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
## 2023-12-11 ENCOUNTER — Ambulatory Visit: Payer: Medicare HMO | Admitting: Psychiatry

## 2023-12-13 DIAGNOSIS — F4312 Post-traumatic stress disorder, chronic: Secondary | ICD-10-CM | POA: Diagnosis not present
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
## 2023-12-17 ENCOUNTER — Ambulatory Visit: Payer: Medicare PPO | Admitting: Internal Medicine

## 2023-12-17 ENCOUNTER — Encounter: Payer: Self-pay | Admitting: Internal Medicine

## 2023-12-17 VITALS — BP 126/84 | HR 84 | Wt 199.4 lb

## 2023-12-17 DIAGNOSIS — K581 Irritable bowel syndrome with constipation: Secondary | ICD-10-CM

## 2023-12-17 DIAGNOSIS — K6289 Other specified diseases of anus and rectum: Secondary | ICD-10-CM

## 2023-12-17 NOTE — Patient Instructions (Addendum)
Use Miralax once daily as needed until you feel better. You can also use a fleets enema for better results.   ._______________________________________________________  If your blood pressure at your visit was 140/90 or greater, please contact your primary care physician to follow up on this.  _______________________________________________________  If you are age 51 or older, your body mass index should be between 23-30. Your Body mass index is 31.23 kg/m. If this is out of the aforementioned range listed, please consider follow up with your Primary Care Provider.  If you are age 92 or younger, your body mass index should be between 19-25. Your Body mass index is 31.23 kg/m. If this is out of the aformentioned range listed, please consider follow up with your Primary Care Provider.   ________________________________________________________  The [place] [facility] providers would like to encourage you to use [facility] to communicate with providers for non-urgent requests or questions.  Due to long hold times on the telephone, sending your provider a message by [facility] may be a faster and more efficient way to get a response.  Please allow 48 business hours for a response.  Please remember that this is for non-urgent requests.  _______________________________________________________  Thank you for choosing me and [facility].  Iva Boop, M.D., [facility]
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
## 2023-12-19 ENCOUNTER — Telehealth: Payer: Self-pay

## 2023-12-19 ENCOUNTER — Telehealth: Payer: Medicare PPO | Admitting: Psychiatry

## 2023-12-19 ENCOUNTER — Encounter: Payer: Self-pay | Admitting: Psychiatry

## 2023-12-19 DIAGNOSIS — F0630 Mood disorder due to known physiological condition, unspecified: Secondary | ICD-10-CM | POA: Diagnosis not present

## 2023-12-19 DIAGNOSIS — F4310 Post-traumatic stress disorder, unspecified: Secondary | ICD-10-CM | POA: Diagnosis not present

## 2023-12-19 MED ORDER — LURASIDONE HCL 20 MG PO TABS: 20.0000 mg | ORAL_TABLET | Freq: Two times a day (BID) | ORAL | 0 refills | Status: DC

## 2023-12-19 MED ORDER — PRAZOSIN HCL 1 MG PO CAPS: 1.0000 mg | ORAL_CAPSULE | Freq: Every day | ORAL | 1 refills | Status: DC

## 2023-12-19 MED ORDER — LAMOTRIGINE 100 MG PO TABS: 100.0000 mg | ORAL_TABLET | Freq: Every day | ORAL | 1 refills | Status: DC

## 2023-12-19 NOTE — Patient Instructions (Signed)
Continue lamotrigine 100 mg daily   Start latuda 20 mg twice a day  Continue prazosin, 1 mg in AM, 2 mg at night  Next appointment- 2/7 at 8 20

## 2023-12-19 NOTE — Telephone Encounter (Signed)
went online and submited the prior auth- pending

## 2023-12-19 NOTE — Telephone Encounter (Signed)
prior Berkley Harvey has been approved case # [id number] exp 11-19-24

## 2023-12-19 NOTE — Telephone Encounter (Signed)
received notice that a prior auth was needed for the lurasidone hcl 20mg 
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
## 2023-12-24 ENCOUNTER — Ambulatory Visit: Payer: Medicare HMO | Admitting: Diagnostic Neuroimaging

## 2023-12-25 ENCOUNTER — Ambulatory Visit: Payer: Medicare PPO

## 2023-12-25 ENCOUNTER — Encounter: Payer: Self-pay | Admitting: Family Medicine

## 2023-12-25 ENCOUNTER — Ambulatory Visit (INDEPENDENT_AMBULATORY_CARE_PROVIDER_SITE_OTHER): Payer: Medicare PPO | Admitting: Family Medicine

## 2023-12-25 ENCOUNTER — Ambulatory Visit: Payer: Self-pay | Admitting: Family Medicine

## 2023-12-25 ENCOUNTER — Telehealth: Payer: Self-pay

## 2023-12-25 VITALS — BP 122/80 | HR 81 | Temp 98.1°F | Ht 67.0 in | Wt 195.9 lb

## 2023-12-25 DIAGNOSIS — R600 Localized edema: Secondary | ICD-10-CM | POA: Diagnosis not present

## 2023-12-25 DIAGNOSIS — M7989 Other specified soft tissue disorders: Secondary | ICD-10-CM | POA: Diagnosis not present

## 2023-12-25 DIAGNOSIS — M25571 Pain in right ankle and joints of right foot: Secondary | ICD-10-CM

## 2023-12-25 DIAGNOSIS — R202 Paresthesia of skin: Secondary | ICD-10-CM | POA: Diagnosis not present

## 2023-12-25 LAB — CBC WITH DIFFERENTIAL/PLATELET
Basophils Absolute: 0 10*3/uL (ref 0.0–0.1)
Basophils Relative: 0.5 % (ref 0.0–3.0)
Eosinophils Absolute: 0.4 10*3/uL (ref 0.0–0.7)
Eosinophils Relative: 4.3 % (ref 0.0–5.0)
HCT: 41.1 % (ref 36.0–46.0)
Hemoglobin: 13.4 g/dL (ref 12.0–15.0)
Lymphocytes Relative: 20.7 % (ref 12.0–46.0)
Lymphs Abs: 2.2 10*3/uL (ref 0.7–4.0)
MCHC: 32.6 g/dL (ref 30.0–36.0)
MCV: 87.7 fL (ref 78.0–100.0)
Monocytes Absolute: 0.7 10*3/uL (ref 0.1–1.0)
Monocytes Relative: 6.6 % (ref 3.0–12.0)
Neutro Abs: 7.1 10*3/uL (ref 1.4–7.7)
Neutrophils Relative %: 67.9 % (ref 43.0–77.0)
Platelets: 318 10*3/uL (ref 150.0–400.0)
RBC: 4.68 Mil/uL (ref 3.87–5.11)
RDW: 13.6 % (ref 11.5–15.5)
WBC: 10.4 10*3/uL (ref 4.0–10.5)

## 2023-12-25 LAB — SEDIMENTATION RATE: Sed Rate: 6 mm/h (ref 0–30)

## 2023-12-25 LAB — URIC ACID: Uric Acid, Serum: 3.7 mg/dL (ref 2.4–7.0)

## 2023-12-25 NOTE — Telephone Encounter (Signed)
  Chief Complaint: right ankle swelling and pain Symptoms: worsening right ankle swelling and pain; worsening left arm/hand numbness and tingling Frequency: constant Pertinent Negatives: Patient denies chest pain, SOB, redness, fever, facial drooping, changes in vision or speech. Disposition: [] ED /[] Urgent Care (no appt availability in office) / [x] Appointment(In office/virtual)/ []  [place] [facility]/ [] Home Care/ [] Refused Recommended Disposition /[]  Mobile Bus/ []  Follow-up with PCP Additional Notes: Patient c/o right ankle swelling and pain. She states ankle has been painful for month and worsened since Sunday becoming swollen and more painful. Patient denies any recent injury, redness or red streaks, calf pain. Patient also mentioned that her left arm and hand numbness and tingling has worsened with history of carpal tunnel. Copied from CRM [id number]. Topic: Clinical - Red Word Triage >> Dec 25, 2023  9:42 AM Franky GRADE wrote: Red Word that prompted transfer to Nurse Triage: patient has been feeling pain off and on, on her right ankle, Sunday night it suddenly swelled up and she is unable to mover her foot. Patient states she did not injure herself. Reason for Disposition  [1] SEVERE pain (e.g., excruciating, unable to walk) AND [2] not improved after 2 hours of pain medicine  Answer Assessment - Initial Assessment Questions 1. LOCATION: Which ankle is swollen? Where is the swelling?     Right ankle. Entire joint swollen.  2. ONSET: When did the swelling start?     Past couple of months in bed she would notice pain in the right ankle, more frequently occurring over the past couple of weeks. Since Sunday swelling and pain.  3. SWELLING: How bad is the swelling? Or, How large is it? (e.g., mild, moderate, severe; size of localized swelling)    - NONE: No joint swelling.   - LOCALIZED: Localized; small area of puffy or swollen skin (e.g., insect bite, skin irritation).    - MILD: Joint looks or feels mildly swollen or puffy.   - MODERATE: Swollen; interferes with normal activities (e.g., work or school); decreased range of movement; may be limping.   - SEVERE: Very swollen; can't move swollen joint at all; limping a lot or unable to walk.     Moderate: able to walk on right foot with some limping and entire joint is swollen.  4. PAIN: Is there any pain? If Yes, ask: How bad is it? (Scale 1-10; or mild, moderate, severe)   - NONE (0): no pain.   - MILD (1-3): doesn't interfere with normal activities.    - MODERATE (4-7): interferes with normal activities (e.g., work or school) or awakens from sleep, limping.    - SEVERE (8-10): excruciating pain, unable to do any normal activities, unable to walk.      5/10 at this time, 7-9/10 when walking on it.  5. CAUSE: What do you think caused the ankle swelling?     Patient states she sprained her ankle years ago and no recent injury. She thought it was related to the cold weather and arthritis.  6. OTHER SYMPTOMS: Do you have any other symptoms? (e.g., fever, chest pain, difficulty breathing, calf pain)     Numbness and tingling in left arm worse in the last week (going on for a while).  Protocols used: Ankle Swelling-A-AH

## 2023-12-25 NOTE — Telephone Encounter (Signed)
ATC pt. Lvm asking pt to bring in CPAP machine or her SD card to her appointment with [facility] tomorrow. Advised pt to call our office back if needed.

## 2023-12-25 NOTE — Progress Notes (Signed)
 Established Patient Office Visit  Subjective   Patient ID: [name] [name], female    DOB: [birth year]  Age: 53 y.o. MRN: [id number]  Chief Complaint  Patient presents with   Ankle Pain    Right ankle pain and swelling started 3 days ago, throbbing, rates of pain 6 out of 10    Arm Pain    Left arm numbness and tingling     HPI   Charlies is seen for acute new problem of right ankle pain.  Started Sunday night.  No known injury.  She noticed some swelling and warmth.  Did not have any kind of ankle sprain.  No change of shoewear.  She tried some ice which seemed to make worse if anything.  She cannot take any nonsteroidals or prednisone secondary to his severe allergy history.  She has no history of known gout or pseudogout.  Pain is about 6 out of 10 at rest and worse with weightbearing.  Denies any other arthralgias  She also relates some left upper extremity paresthesias and tingling.  She has known carpal tunnel syndrome and is waiting for surgery later this month.  Denies any severe cervical neck pain.  Numbness mostly involving first 3 digits left hand.  Does radiate up toward the elbow.  Past Medical History:  Diagnosis Date   Anxiety    Bipolar disorder (HCC)    Chronic kidney disease    kidney infections   Depression    Fibromyalgia    GERD (gastroesophageal reflux disease)    History of cervical dysplasia    History of exercise intolerance    normal ETT 10-24-2016   History of kidney stones    Hyperlipidemia    Hypertension    Migraines    Osteoarthritis    PCOS (polycystic ovarian syndrome)    Pneumonia 2018   Rash    in area of eswl 04-23-2017   Right ureteral calculus    Sleep apnea    Tachycardia cardiologist-  dr harding   controlled w/ metoprolol    Past Surgical History:  Procedure Laterality Date   ANTERIOR CERVICAL DECOMP/DISCECTOMY FUSION N/A 09/01/2015   Procedure: ANTERIOR CERVICAL DECOMPRESSION/DISCECTOMY FUSION PLATING BONEGRAFT CERVICAL  FIVE-SIX;  Surgeon: Morene Hicks Ditty, MD;  Location: [facility];  Service: Neurosurgery;  Laterality: N/A;   BIOPSY  05/20/2019   Procedure: BIOPSY;  Surgeon: Kristie Lamprey, MD;  Location: [facility];  Service: Endoscopy;;   BREAST BIOPSY Left 02/14/2022   U/S   BREAST BIOPSY Right 03/24/2022   MRI   BREAST EXCISIONAL BIOPSY Bilateral left 10/15/2002;   right 1997   left ductal system excision (papilloma w/ florid epithelial hyperplasia)/  right benign lumpectomy   BREAST LUMPECTOMY WITH RADIOACTIVE SEED LOCALIZATION Left 06/20/2022   Procedure: LEFT BREAST LUMPECTOMY WITH RADIOACTIVE SEED LOCALIZATION;  Surgeon: Vanderbilt Ned, MD;  Location: [facility];  Service: General;  Laterality: Left;   CARDIOVASCULAR STRESS TEST  04/27/2009   normal nuclear study w/ no ischemia/  normal LV function and wall motion , ef 66%   CHOLECYSTECTOMY N/A 03/13/2013   Procedure: LAPAROSCOPIC CHOLECYSTECTOMY;  Surgeon: Vicenta DELENA Poli, MD;  Location: [facility];  Service: General;  Laterality: N/A;   COLONOSCOPY  last one 11-08-2006   COLONOSCOPY N/A 05/20/2019   Procedure: COLONOSCOPY;  Surgeon: Kristie Lamprey, MD;  [facility]: [facility];  Service: Endoscopy;  Laterality: N/A;   CYSTOSCOPY N/A 05/08/2018   Procedure: CYSTOSCOPY;  Surgeon: Estelle Service, MD;  Location: [facility];  Service: Gynecology;  Laterality: N/A;   CYSTOSCOPY WITH RETROGRADE PYELOGRAM, URETEROSCOPY AND STENT PLACEMENT Right 05/18/2017   Procedure: CYSTOSCOPY WITH RETROGRADE PYELOGRAM, URETEROSCOPY AND STENT PLACEMENT;  Surgeon: Alvaro Hummer, MD;  Location: [facility];  Service: Urology;  Laterality: Right;   DX LAPAROSCOPY W/ LYSIS ADHESIONS/  LASER VAPORIZATION OF CERVIX  06/25/2002   ESOPHAGOGASTRODUODENOSCOPY  04/25/2005   ESOPHAGOGASTRODUODENOSCOPY (EGD) WITH PROPOFOL  N/A 05/20/2019   Procedure: ESOPHAGOGASTRODUODENOSCOPY (EGD) WITH PROPOFOL ;  Surgeon: Kristie Lamprey, MD;   [facility];  Service: Endoscopy;  Laterality: N/A;   EXPLORATORY LEFT THUMB REPAIR TENDON/ LACERATION  09/27/1999   EXTRACORPOREAL SHOCK WAVE LITHOTRIPSY Right 04/23/2017   Procedure: RIGHT EXTRACORPOREAL SHOCK WAVE LITHOTRIPSY (ESWL);  Surgeon: Alvaro Hummer, MD;  [facility]: [facility];  Service: Urology;  Laterality: Right;   EXTRACORPOREAL SHOCK WAVE LITHOTRIPSY Right 04/23/2017   HOLMIUM LASER APPLICATION Right 05/18/2017   Procedure: HOLMIUM LASER APPLICATION;  Surgeon: Alvaro Hummer, MD;  Location: [facility];  Service: Urology;  Laterality: Right;   LAPAROSCOPIC VAGINAL HYSTERECTOMY WITH SALPINGECTOMY Bilateral 05/08/2018   Procedure: LAPAROSCOPIC ASSISTED VAGINAL HYSTERECTOMY WITH SALPINGECTOMY,  I & D Sebaceous Left Labia;  Surgeon: Estelle Service, MD;  Location: [facility];  Service: Gynecology;  Laterality: Bilateral;   LAPAROSCOPY  07/30/2012   Procedure: LAPAROSCOPY DIAGNOSTIC;  Surgeon: Service LELON Estelle, MD;  Location: [facility];  Service: Gynecology;  Laterality: N/A;   POLYPECTOMY  05/20/2019   Procedure: POLYPECTOMY;  Surgeon: Kristie Lamprey, MD;  [facility];  Service: Endoscopy;;   WISDOM TOOTH EXTRACTION      reports that she has been smoking cigarettes. She has a 12.5 pack-year smoking history. She has never used smokeless tobacco. She reports that she does not drink alcohol and does not use drugs. family history includes Alcohol abuse in her paternal uncle; Arthritis in her father, maternal grandmother, mother, and paternal grandmother; Asthma in her paternal grandmother; COPD in her maternal grandmother, paternal grandfather, paternal grandmother, and son; Cancer in her paternal grandmother; Coronary artery disease in an other family member; Depression in her father, maternal grandmother, mother, paternal grandfather, and sister; Diabetes in her father and mother; Drug abuse in her son; Early death in her maternal grandfather; Emphysema in an  other family member; Hearing loss in her father and paternal grandmother; Heart attack in her maternal grandfather, paternal grandfather, and paternal grandmother; Heart disease in her maternal grandmother and paternal grandmother; Hyperlipidemia in her paternal grandmother; Hypertension in her father, mother, and paternal grandmother; Kidney disease in her paternal grandmother; Schizophrenia in her son; Stroke in her paternal grandmother; Suicidality in her paternal grandfather; Tongue cancer in an other family member. Allergies  Allergen Reactions   Aspirin Shortness Of Breath and Other (See Comments)    Angiodema   Bee Venom Hives    Face swelling and chest pain   Diclofenac Anaphylaxis   Naproxen Swelling    Facial swelling   Nsaids Anaphylaxis    Swelling of eyes mouth and throat difficulty breathing   Prednisone     Worsened depression, suicidal ideation    Terbinafine  And Related     Worsened depression, suicidal ideation    Review of Systems  Constitutional:  Negative for chills and fever.  Respiratory:  Negative for shortness of breath.   Cardiovascular:  Negative for chest pain.  Musculoskeletal:  Positive for joint pain. Negative for myalgias and neck pain.  Skin:  Negative for rash.  Neurological:  Positive for sensory change. Negative for  focal weakness.      Objective:     BP 122/80 (BP Location: Left Arm, Patient Position: Sitting, Cuff Size: Large)   Pulse 81   Temp 98.1 F (36.7 C) (Oral)   Ht 5' 7 (1.702 m)   Wt 195 lb 14.4 oz (88.9 kg)   LMP 04/30/2018   SpO2 97%   BMI 30.68 kg/m  BP Readings from Last 3 Encounters:  12/25/23 122/80  12/17/23 126/84  10/30/23 110/70   Wt Readings from Last 3 Encounters:  12/25/23 195 lb 14.4 oz (88.9 kg)  12/17/23 199 lb 6 oz (90.4 kg)  10/30/23 193 lb (87.5 kg)      Physical Exam Vitals reviewed.  Constitutional:      General: She is not in acute distress.    Appearance: She is not ill-appearing.   Cardiovascular:     Rate and Rhythm: Normal rate and regular rhythm.  Pulmonary:     Effort: Pulmonary effort is normal.     Breath sounds: Normal breath sounds.  Musculoskeletal:     Comments: Right ankle reveal some obvious edema.  She does have evidence for some effusion.  Slight warmth.  No erythema.  No ecchymosis.  Achilles intact and nontender.  She has some nonspecific tenderness medially, laterally, and anteriorly around the ankle joint region.  No bony tenderness lateral or medial malleolus  Neurological:     General: No focal deficit present.     Mental Status: She is alert.     Motor: No weakness.      No results found for any visits on 12/25/23.    The 10-year ASCVD risk score (Arnett DK, et al., 2019) is: 3.1%* (Cholesterol units were assumed)    Assessment & Plan:   #1 acute right ankle pain of few days duration.  No known injury.  She is basically presenting with acute monoarticular arthritis of the right ankle.  No known history of gout or pseudogout.  No erythema or fever to suggest infectious source.  -Start with x-ray right ankle -Obtain labs to rule out inflammatory arthritis -Patient is unable to take nonsteroidals or prednisone secondary to severe anaphylaxis with these previously. -If above studies unrevealing consider referral to sports medicine for further evaluation  #2 paresthesias involving left upper extremity.  Suspect related to carpal tunnel syndrome.  She is waiting for surgery of this later this month.   No follow-ups on file.    Wolm Scarlet, MD

## 2023-12-25 NOTE — Patient Instructions (Signed)
Watch for any increased redness, warmth, or pain.

## 2023-12-26 ENCOUNTER — Ambulatory Visit: Payer: Medicare HMO | Admitting: Primary Care

## 2023-12-26 ENCOUNTER — Encounter: Payer: Self-pay | Admitting: Family Medicine

## 2023-12-26 DIAGNOSIS — M25571 Pain in right ankle and joints of right foot: Secondary | ICD-10-CM

## 2023-12-27 ENCOUNTER — Telehealth: Payer: Self-pay | Admitting: Psychiatry

## 2023-12-27 DIAGNOSIS — F4312 Post-traumatic stress disorder, chronic: Secondary | ICD-10-CM | POA: Diagnosis not present

## 2023-12-27 LAB — ANA: Anti Nuclear Antibody (ANA): NEGATIVE

## 2023-12-27 LAB — RHEUMATOID FACTOR: Rheumatoid fact SerPl-aCnc: <10 [IU]/mL (ref <14)

## 2023-12-27 LAB — CYCLIC CITRUL PEPTIDE ANTIBODY, IGG: Cyclic Citrullin Peptide Ab: <16 U

## 2023-12-27 NOTE — Telephone Encounter (Signed)
 It is entirely up to her. If she would like to keep the appointment, I am happy to see her. If she has no major concerns, she can reschedule for the soonest available appointment for 30 mins. In person is preferred.

## 2023-12-27 NOTE — Telephone Encounter (Signed)
 Please contact the pharmacy to verify the prescription for Latuda  so she can receive it, and update the patient accordingly.

## 2023-12-28 ENCOUNTER — Telehealth: Payer: Self-pay | Admitting: Psychiatry

## 2023-12-31 ENCOUNTER — Other Ambulatory Visit: Payer: Self-pay

## 2023-12-31 ENCOUNTER — Ambulatory Visit: Payer: Medicare HMO | Admitting: Psychiatry

## 2023-12-31 ENCOUNTER — Ambulatory Visit: Payer: Medicare PPO | Admitting: Family Medicine

## 2023-12-31 VITALS — BP 138/88 | Ht 67.0 in | Wt 195.0 lb

## 2023-12-31 DIAGNOSIS — M25571 Pain in right ankle and joints of right foot: Secondary | ICD-10-CM | POA: Diagnosis not present

## 2023-12-31 NOTE — Patient Instructions (Signed)
 You do have arthritis on your ultrasound that is more than expected. Gout is another possibility but should be better at this point (your labs can be normal with this). Wear the boot when up and walking around. Icing 15 minutes at a time 3-4 times a day. Elevate above your heart when possible to help with swelling. Tylenol  500mg  1-2 tabs three times a day. Voltaren gel as you have been provided this does not cause an allergy for you. While a steroid injection is an option we'd like you to get better without this because of the side effects you've had with prednisone before. Follow up with me in 1 month.

## 2023-12-31 NOTE — Progress Notes (Addendum)
PCP: [name], [name], MD  SUBJECTIVE:   HPI:  Patient is a 53 y.o. female here with chief complaint of right ankle pain. Reports rolling 15 years ago had totally improved. Two to three months ago began having acute pain without injury, worst at night, aching, throbbing burning. Had swelling without overlying erythema or bruising. The swelling is mostly on the lateral side, she tried applying voltaren gel with worsening of the edema. No new shoes, exercise routines. No history of gout.  Had labwork including CBC with diff, ANA, anti-CCP, RF, Sed rate, uric acid that were normal.  History of adverse reaction to PO and injected steroids in the past with mood sxs.  Anaphylaxis with oral NSAIDs.  Pertinent ROS were reviewed with the patient and found to be negative unless otherwise specified above in HPI.   PERTINENT  PMH / PSH / FH / SH:  Past Medical, Surgical, Social, and Family History Reviewed & Updated in the EMR.  Pertinent findings include:    Past Medical History:  Diagnosis Date   Anxiety    Bipolar disorder (HCC)    Chronic kidney disease    kidney infections   Depression    Fibromyalgia    GERD (gastroesophageal reflux disease)    History of cervical dysplasia    History of exercise intolerance    normal ETT 10-24-2016   History of kidney stones    Hyperlipidemia    Hypertension    Migraines    Osteoarthritis    PCOS (polycystic ovarian syndrome)    Pneumonia 2018   Rash    in area of eswl 04-23-2017   Right ureteral calculus    Sleep apnea    Tachycardia cardiologist-  dr harding   controlled w/ metoprolol    Current Outpatient Medications on File Prior to Visit  Medication Sig Dispense Refill   acetaminophen (TYLENOL) 500 MG tablet Take 1,000 mg by mouth every 6 (six) hours as needed for moderate pain.     Ascorbic Acid (VITAMIN C PO) Take by mouth.     B Complex Vitamins (B-COMPLEX/B-12 SL) Place under the tongue.     Cholecalciferol (VITAMIN D) 50 MCG  (2000 UT) tablet Take 2,000 Units by mouth daily.     Cyanocobalamin (VITAMIN B 12 PO) Take by mouth.     Cyanocobalamin (VITAMIN B-12 IJ) Inject as directed.     dicyclomine (BENTYL) 20 MG tablet Take 1 tablet (20 mg total) by mouth every 6 (six) hours as needed for spasms (abdominal or rectal pain). 120 tablet 5   EPINEPHrine 0.3 mg/0.3 mL IJ SOAJ injection Inject 0.3 mg into the muscle as needed for anaphylaxis. 2 each 0   ferrous sulfate 325 (65 FE) MG tablet Take 325 mg by mouth at bedtime.     lamoTRIgine (LAMICTAL) 100 MG tablet Take 1 tablet (100 mg total) by mouth daily. 30 tablet 1   linaclotide (LINZESS) 290 MCG CAPS capsule Take 1 capsule (290 mcg total) by mouth daily before breakfast. May use prn 30 capsule 11   losartan (COZAAR) 25 MG tablet Take 1 tablet (25 mg total) by mouth daily. 90 tablet 0   lurasidone (LATUDA) 20 MG TABS tablet Take 1 tablet (20 mg total) by mouth in the morning and at bedtime. 60 tablet 0   Multiple Vitamins-Minerals (MULTIVITAMIN WITH MINERALS) tablet Take 1 tablet by mouth at bedtime.     omeprazole (PRILOSEC) 40 MG capsule Take 1 capsule (40 mg total) by mouth daily. 90  capsule 3   prazosin (MINIPRESS) 1 MG capsule Take 1 capsule (1 mg total) by mouth daily. 30 capsule 1   prazosin (MINIPRESS) 2 MG capsule Take 1 capsule (2 mg total) by mouth at bedtime. 30 capsule 1   pregabalin (LYRICA) 300 MG capsule TAKE 1 CAPSULE BY MOUTH TWICE DAILY 60 capsule 2   propranolol (INDERAL) 40 MG tablet Take 1 tablet (40 mg total) by mouth 2 (two) times daily. 180 tablet 3   No current facility-administered medications on file prior to visit.    Past Surgical History:  Procedure Laterality Date   ANTERIOR CERVICAL DECOMP/DISCECTOMY FUSION N/A 09/01/2015   Procedure: ANTERIOR CERVICAL DECOMPRESSION/DISCECTOMY FUSION PLATING BONEGRAFT CERVICAL FIVE-SIX;  Surgeon: Loura Halt Ditty, MD;  Location: [facility];  Service: Neurosurgery;  Laterality: N/A;   BIOPSY   05/20/2019   Procedure: BIOPSY;  Surgeon: Charna Elizabeth, MD;  [facility]: [facility];  Service: Endoscopy;;   BREAST BIOPSY Left 02/14/2022   U/S   BREAST BIOPSY Right 03/24/2022   MRI   BREAST EXCISIONAL BIOPSY Bilateral left 10/15/2002;   right 1997   left ductal system excision (papilloma w/ florid epithelial hyperplasia)/  right benign lumpectomy   BREAST LUMPECTOMY WITH RADIOACTIVE SEED LOCALIZATION Left 06/20/2022   Procedure: LEFT BREAST LUMPECTOMY WITH RADIOACTIVE SEED LOCALIZATION;  Surgeon: Harriette Bouillon, MD;  Location: [facility];  Service: General;  Laterality: Left;   CARDIOVASCULAR STRESS TEST  04/27/2009   normal nuclear study w/ no ischemia/  normal LV function and wall motion , ef 66%   CHOLECYSTECTOMY N/A 03/13/2013   Procedure: LAPAROSCOPIC CHOLECYSTECTOMY;  Surgeon: Shelly Rubenstein, MD;  Location: [facility];  Service: General;  Laterality: N/A;   COLONOSCOPY  last one 11-08-2006   COLONOSCOPY N/A 05/20/2019   Procedure: COLONOSCOPY;  Surgeon: Charna Elizabeth, MD;  [facility];  Service: Endoscopy;  Laterality: N/A;   CYSTOSCOPY N/A 05/08/2018   Procedure: CYSTOSCOPY;  Surgeon: Huel Cote, MD;  Location: [facility];  Service: Gynecology;  Laterality: N/A;   CYSTOSCOPY WITH RETROGRADE PYELOGRAM, URETEROSCOPY AND STENT PLACEMENT Right 05/18/2017   Procedure: CYSTOSCOPY WITH RETROGRADE PYELOGRAM, URETEROSCOPY AND STENT PLACEMENT;  Surgeon: Sebastian Ache, MD;  Location: [facility];  Service: Urology;  Laterality: Right;   DX LAPAROSCOPY W/ LYSIS ADHESIONS/  LASER VAPORIZATION OF CERVIX  06/25/2002   ESOPHAGOGASTRODUODENOSCOPY  04/25/2005   ESOPHAGOGASTRODUODENOSCOPY (EGD) WITH PROPOFOL N/A 05/20/2019   Procedure: ESOPHAGOGASTRODUODENOSCOPY (EGD) WITH PROPOFOL;  Surgeon: Charna Elizabeth, MD;  [facility];  Service: Endoscopy;  Laterality: N/A;   EXPLORATORY LEFT THUMB REPAIR TENDON/ LACERATION  09/27/1999    EXTRACORPOREAL SHOCK WAVE LITHOTRIPSY Right 04/23/2017   Procedure: RIGHT EXTRACORPOREAL SHOCK WAVE LITHOTRIPSY (ESWL);  Surgeon: Sebastian Ache, MD;  [facility];  Service: Urology;  Laterality: Right;   EXTRACORPOREAL SHOCK WAVE LITHOTRIPSY Right 04/23/2017   HOLMIUM LASER APPLICATION Right 05/18/2017   Procedure: HOLMIUM LASER APPLICATION;  Surgeon: Sebastian Ache, MD;  Location: [facility];  Service: Urology;  Laterality: Right;   LAPAROSCOPIC VAGINAL HYSTERECTOMY WITH SALPINGECTOMY Bilateral 05/08/2018   Procedure: LAPAROSCOPIC ASSISTED VAGINAL HYSTERECTOMY WITH SALPINGECTOMY,  I & D Sebaceous Left Labia;  Surgeon: Huel Cote, MD;  [facility];  Service: Gynecology;  Laterality: Bilateral;   LAPAROSCOPY  07/30/2012   Procedure: LAPAROSCOPY DIAGNOSTIC;  Surgeon: Oliver Pila, MD;  Location[facility];  Service: Gynecology;  Laterality: N/A;   POLYPECTOMY  05/20/2019   Procedure: POLYPECTOMY;  Surgeon: Charna Elizabeth, MD;  [facility];  Service: Endoscopy;;   WISDOM TOOTH EXTRACTION      Allergies  Allergen Reactions   Aspirin Shortness Of Breath and Other (See Comments)    Angiodema   Bee Venom Hives    Face swelling and chest pain   Diclofenac Anaphylaxis   Naproxen Swelling    Facial swelling   Nsaids Anaphylaxis    Swelling of eyes mouth and throat difficulty breathing   Prednisone     Worsened depression, suicidal ideation    Terbinafine And Related     Worsened depression, suicidal ideation    OBJECTIVE:  BP 138/88   Ht 5\' 7"  (1.702 m)   Wt 195 lb (88.5 kg)   LMP 04/30/2018   BMI 30.54 kg/m   PHYSICAL EXAM:  GEN: Alert and Oriented, NAD, comfortable in exam room RESP: Unlabored respirations, symmetric chest rise PSY: normal mood, congruent affect   MSK EXAM: Ankle/Foot, right: TTP diffusely, especially syndesmosis, ATFL, peroneal tendons, achilles. No visible erythema, swelling, ecchymosis, or bony deformity. No  notable pes planus/cavus deformity. Transverse arch grossly intact; Normal eversion/inversion stress testing. Range of motion is full in all directions. Strength is 5/5 in all directions. No peroneal tendon tenderness or subluxation; No tenderness on posterior aspects of lateral and medial malleolus; Stable lateral and medial ligaments; Talar dome nontender; No plantar calcaneal tenderness; No tenderness over the navicular prominence or base of the 5th MT; No tenderness over cuboid; No tenderness at the distal metatarsals; Able to walk 4 steps.  Provocative Testing:   - Anterior Drawer: NEG  - Talar Tilt: NEG  - Kleiger's Test: NEG  - Tib/Fib Squeeze Test: NEG; Calcaneal Squeeze Test: NEG  - Tinel's test at Tarsal Tunnel: NEG  Limited MSK u/s right ankle: Mild effusion.  Notable spurring off tibia and talar dome.  Peroneus brevis with partial tear, C sign and 3 fingers sign noted.  Flexor hallucis longus with moderate tenosynovitis.  Achilles tendon appears normal. Assessment & Plan Right ankle pain, unspecified chronicity Acute on chronic right ankle pain. History of injury >15 years ago now having swelling for past few weeks. On exam edema of lateral ankle without overlying skin changes. On [place] increased edema around FHL. Trace ankle joint effusion, not large enough for aspiration. X-rays from PCP c/w osteoarthritis. Additionally multiple bone spurs noted. Unclear etiology of acute swelling, could be due to osteoarthritis. History of swelling less c/w gout. Will trial conservative treatment with tylenol, compression, ice. Follow up in one month.    Micah Noel MD PGY2 [facility]

## 2024-01-02 DIAGNOSIS — G5602 Carpal tunnel syndrome, left upper limb: Secondary | ICD-10-CM | POA: Diagnosis not present

## 2024-01-10 DIAGNOSIS — F4312 Post-traumatic stress disorder, chronic: Secondary | ICD-10-CM | POA: Diagnosis not present

## 2024-01-26 ENCOUNTER — Other Ambulatory Visit: Payer: Self-pay | Admitting: Psychiatry

## 2024-01-30 ENCOUNTER — Ambulatory Visit: Payer: Medicare PPO | Admitting: Family Medicine

## 2024-01-30 DIAGNOSIS — N898 Other specified noninflammatory disorders of vagina: Secondary | ICD-10-CM | POA: Diagnosis not present

## 2024-01-30 DIAGNOSIS — N63 Unspecified lump in unspecified breast: Secondary | ICD-10-CM | POA: Diagnosis not present

## 2024-01-31 ENCOUNTER — Ambulatory Visit: Payer: Medicare HMO | Admitting: Primary Care

## 2024-01-31 ENCOUNTER — Other Ambulatory Visit: Payer: Self-pay | Admitting: Neurosurgery

## 2024-01-31 DIAGNOSIS — M5126 Other intervertebral disc displacement, lumbar region: Secondary | ICD-10-CM

## 2024-02-01 ENCOUNTER — Other Ambulatory Visit: Payer: Self-pay | Admitting: Gynecology

## 2024-02-01 DIAGNOSIS — N644 Mastodynia: Secondary | ICD-10-CM

## 2024-02-01 DIAGNOSIS — N6320 Unspecified lump in the left breast, unspecified quadrant: Secondary | ICD-10-CM
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
## 2024-02-04 ENCOUNTER — Ambulatory Visit: Payer: Medicare PPO | Admitting: Family Medicine

## 2024-02-05 ENCOUNTER — Encounter: Payer: Self-pay | Admitting: Neurosurgery

## 2024-02-06 ENCOUNTER — Encounter: Payer: Self-pay | Admitting: Psychiatry

## 2024-02-06 ENCOUNTER — Telehealth: Payer: Self-pay | Admitting: Psychiatry

## 2024-02-06 DIAGNOSIS — F3181 Bipolar II disorder: Secondary | ICD-10-CM | POA: Diagnosis not present

## 2024-02-06 DIAGNOSIS — F4310 Post-traumatic stress disorder, unspecified: Secondary | ICD-10-CM

## 2024-02-06 MED ORDER — LAMOTRIGINE 100 MG PO TABS: 100.0000 mg | ORAL_TABLET | Freq: Every day | ORAL | 1 refills | Status: AC

## 2024-02-06 MED ORDER — PRAZOSIN HCL 2 MG PO CAPS
2.0000 mg | ORAL_CAPSULE | Freq: Every day | ORAL | 0 refills | Status: DC
Start: 2024-02-28 — End: 2024-05-12

## 2024-02-06 MED ORDER — PRAZOSIN HCL 1 MG PO CAPS: 1.0000 mg | ORAL_CAPSULE | Freq: Every day | ORAL | 0 refills | Status: DC

## 2024-02-06 NOTE — Patient Instructions (Signed)
 Continue lamotrigine 100 mg daily   Discontinue latuda  Continue prazosin, 1 mg in AM, 2 mg at night  Obtain EKG Next appointment- 4/23 at 11 am

## 2024-02-07 DIAGNOSIS — F4312 Post-traumatic stress disorder, chronic: Secondary | ICD-10-CM | POA: Diagnosis not present

## 2024-02-10 ENCOUNTER — Ambulatory Visit
Admission: RE | Admit: 2024-02-10 | Discharge: 2024-02-10 | Disposition: A | Discharge status: Home / Self Care | Source: Ambulatory Visit | Attending: Neurosurgery | Admitting: Neurosurgery

## 2024-02-10 DIAGNOSIS — M5126 Other intervertebral disc displacement, lumbar region: Secondary | ICD-10-CM | POA: Diagnosis not present

## 2024-02-10 DIAGNOSIS — M47816 Spondylosis without myelopathy or radiculopathy, lumbar region: Secondary | ICD-10-CM | POA: Diagnosis not present

## 2024-02-10 DIAGNOSIS — M48061 Spinal stenosis, lumbar region without neurogenic claudication: Secondary | ICD-10-CM | POA: Diagnosis not present

## 2024-02-11 ENCOUNTER — Telehealth: Payer: Self-pay

## 2024-02-11 NOTE — Telephone Encounter (Signed)
 Copied from CRM [id number]. Topic: General - Other >> Feb 11, 2024  3:08 PM Aletta Edouard wrote: Reason for CRM: patient is calling requesting a EKG for her to be able to start on new medication for her behavior health issue

## 2024-02-12 ENCOUNTER — Telehealth: Payer: Self-pay | Admitting: Psychiatry

## 2024-02-12 NOTE — Telephone Encounter (Signed)
 I left pt a voicemail to call the office back to schedule appt with PCP.

## 2024-02-20 ENCOUNTER — Ambulatory Visit: Admitting: Family Medicine

## 2024-02-26 ENCOUNTER — Ambulatory Visit: Admitting: Family Medicine

## 2024-02-27 ENCOUNTER — Other Ambulatory Visit: Payer: Self-pay | Admitting: Family Medicine

## 2024-02-27 ENCOUNTER — Ambulatory Visit: Admitting: Family Medicine

## 2024-02-27 VITALS — BP 136/86 | HR 88 | Temp 98.3°F | Resp 16 | Ht 67.0 in | Wt 198.8 lb

## 2024-02-27 DIAGNOSIS — I10 Essential (primary) hypertension: Secondary | ICD-10-CM

## 2024-02-27 DIAGNOSIS — F603 Borderline personality disorder: Secondary | ICD-10-CM | POA: Diagnosis not present

## 2024-02-27 DIAGNOSIS — M797 Fibromyalgia: Secondary | ICD-10-CM

## 2024-02-27 DIAGNOSIS — Z79899 Other long term (current) drug therapy: Secondary | ICD-10-CM

## 2024-02-27 DIAGNOSIS — R000 Tachycardia, unspecified: Secondary | ICD-10-CM

## 2024-02-27 DIAGNOSIS — L0292 Furuncle, unspecified: Secondary | ICD-10-CM | POA: Diagnosis not present

## 2024-02-27 DIAGNOSIS — Z5181 Encounter for therapeutic drug level monitoring: Secondary | ICD-10-CM | POA: Diagnosis not present

## 2024-02-27 MED ORDER — SULFAMETHOXAZOLE-TRIMETHOPRIM 800-160 MG PO TABS
1.0000 | ORAL_TABLET | Freq: Two times a day (BID) | ORAL | 0 refills | Status: AC
Start: 2024-02-27 — End: 2024-03-05

## 2024-02-27 NOTE — Patient Instructions (Addendum)
 A few things to remember from today's visit:  Medication management - Plan: EKG 12-Lead  Fibromyalgia  Forunculosis  If you need refills for medications you take chronically, please call your pharmacy. Do not use My Chart to request refills or for acute issues that need immediate attention. If you send a my chart message, it may take a few days to be addressed, specially if I am not in the office.  Please be sure medication list is accurate. If a new problem present, please set up appointment sooner than planned today.

## 2024-02-27 NOTE — Assessment & Plan Note (Signed)
 BP today mildly elevated, reporting home BP's 120's/70's. Continue Propranolol 40 mg bid and Losartan 25 mg daily. Low salt diet to continue. Continue monitoring BP regularly.

## 2024-02-27 NOTE — Assessment & Plan Note (Signed)
 Reporting episodes of tachycardia. She has had cardiac work up , echo 02/2023 LVEF 60-65% and heart monitor in 01/2023:Isolated SVEs were rare (<1.0%), and no SVE Couplets or SVE Triplets were present.  Continue Propranolol 40 mg bid. Monitor HR at home.
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
## 2024-02-27 NOTE — Assessment & Plan Note (Signed)
 Following with psychiatrist. Planning on adding a new medication and has been requested to have an EKG done. EKG today NSR, normal axis and intervals.

## 2024-02-27 NOTE — Assessment & Plan Note (Signed)
 Problem is stable. Low impact exercise and good sleep hygiene recommended. Continue Lyrica 300 mg twice daily. PDMP reviewed. F/U in 6 months.

## 2024-02-29 ENCOUNTER — Encounter: Payer: Self-pay | Admitting: Family Medicine
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
## 2024-03-04 ENCOUNTER — Other Ambulatory Visit: Payer: Self-pay | Admitting: Family Medicine

## 2024-03-04 DIAGNOSIS — I10 Essential (primary) hypertension: Secondary | ICD-10-CM

## 2024-03-05 DIAGNOSIS — F4312 Post-traumatic stress disorder, chronic: Secondary | ICD-10-CM | POA: Diagnosis not present

## 2024-03-10 ENCOUNTER — Ambulatory Visit: Admitting: Primary Care

## 2024-03-11 ENCOUNTER — Telehealth: Payer: Self-pay | Admitting: Psychiatry

## 2024-03-11 ENCOUNTER — Other Ambulatory Visit: Payer: Self-pay | Admitting: Gynecology

## 2024-03-11 DIAGNOSIS — N6320 Unspecified lump in the left breast, unspecified quadrant: Secondary | ICD-10-CM

## 2024-03-11 DIAGNOSIS — N644 Mastodynia: Secondary | ICD-10-CM

## 2024-03-12 ENCOUNTER — Other Ambulatory Visit: Payer: Self-pay | Admitting: Psychiatry

## 2024-03-12 ENCOUNTER — Telehealth: Admitting: Psychiatry

## 2024-03-14 ENCOUNTER — Encounter: Payer: Self-pay | Admitting: Family Medicine

## 2024-03-17 MED ORDER — EPINEPHRINE 0.3 MG/0.3ML IJ SOAJ: 0.3000 mg | INTRAMUSCULAR | 0 refills | Status: AC | PRN

## 2024-03-20 ENCOUNTER — Other Ambulatory Visit

## 2024-03-20 ENCOUNTER — Encounter

## 2024-03-20 DIAGNOSIS — M5126 Other intervertebral disc displacement, lumbar region: Secondary | ICD-10-CM | POA: Diagnosis not present

## 2024-03-26 DIAGNOSIS — F4312 Post-traumatic stress disorder, chronic: Secondary | ICD-10-CM | POA: Diagnosis not present

## 2024-04-09 DIAGNOSIS — F4312 Post-traumatic stress disorder, chronic: Secondary | ICD-10-CM | POA: Diagnosis not present

## 2024-04-15 DIAGNOSIS — F39 Unspecified mood [affective] disorder: Secondary | ICD-10-CM | POA: Diagnosis not present

## 2024-04-15 DIAGNOSIS — F4312 Post-traumatic stress disorder, chronic: Secondary | ICD-10-CM | POA: Diagnosis not present

## 2024-04-16 ENCOUNTER — Ambulatory Visit
Admission: RE | Admit: 2024-04-16 | Discharge: 2024-04-16 | Disposition: A | Discharge status: Home / Self Care | Source: Ambulatory Visit | Attending: Gynecology | Admitting: Gynecology

## 2024-04-16 ENCOUNTER — Ambulatory Visit

## 2024-04-16 DIAGNOSIS — N6321 Unspecified lump in the left breast, upper outer quadrant: Secondary | ICD-10-CM | POA: Diagnosis not present

## 2024-04-16 DIAGNOSIS — N6320 Unspecified lump in the left breast, unspecified quadrant: Secondary | ICD-10-CM

## 2024-04-16 DIAGNOSIS — N644 Mastodynia: Secondary | ICD-10-CM

## 2024-05-05 ENCOUNTER — Ambulatory Visit: Admitting: Primary Care

## 2024-05-12 ENCOUNTER — Ambulatory Visit (INDEPENDENT_AMBULATORY_CARE_PROVIDER_SITE_OTHER): Admitting: Family Medicine

## 2024-05-12 ENCOUNTER — Ambulatory Visit: Payer: Self-pay | Admitting: Family Medicine

## 2024-05-12 ENCOUNTER — Encounter (INDEPENDENT_AMBULATORY_CARE_PROVIDER_SITE_OTHER): Payer: Self-pay

## 2024-05-12 ENCOUNTER — Ambulatory Visit (INDEPENDENT_AMBULATORY_CARE_PROVIDER_SITE_OTHER)

## 2024-05-12 ENCOUNTER — Encounter: Payer: Self-pay | Admitting: Family Medicine

## 2024-05-12 VITALS — BP 118/70 | HR 94 | Resp 12 | Ht 67.0 in | Wt 203.0 lb

## 2024-05-12 DIAGNOSIS — M546 Pain in thoracic spine: Secondary | ICD-10-CM

## 2024-05-12 DIAGNOSIS — E66811 Obesity, class 1: Secondary | ICD-10-CM | POA: Diagnosis not present

## 2024-05-12 DIAGNOSIS — R5383 Other fatigue: Secondary | ICD-10-CM | POA: Diagnosis not present

## 2024-05-12 DIAGNOSIS — Z23 Encounter for immunization: Secondary | ICD-10-CM

## 2024-05-12 DIAGNOSIS — Z6831 Body mass index (BMI) 31.0-31.9, adult: Secondary | ICD-10-CM

## 2024-05-12 DIAGNOSIS — M797 Fibromyalgia: Secondary | ICD-10-CM

## 2024-05-12 DIAGNOSIS — R0602 Shortness of breath: Secondary | ICD-10-CM | POA: Diagnosis not present

## 2024-05-12 DIAGNOSIS — R079 Chest pain, unspecified: Secondary | ICD-10-CM | POA: Diagnosis not present

## 2024-05-12 LAB — CBC
HCT: 42.1 % (ref 36.0–46.0)
Hemoglobin: 13.9 g/dL (ref 12.0–15.0)
MCHC: 32.9 g/dL (ref 30.0–36.0)
MCV: 84 fl (ref 78.0–100.0)
Platelets: 306 10*3/uL (ref 150.0–400.0)
RBC: 5.01 Mil/uL (ref 3.87–5.11)
RDW: 13.3 % (ref 11.5–15.5)
WBC: 9.5 10*3/uL (ref 4.0–10.5)

## 2024-05-12 LAB — TSH: TSH: 1.33 u[IU]/mL (ref 0.35–5.50)

## 2024-05-12 LAB — BASIC METABOLIC PANEL WITH GFR
BUN: 12 mg/dL (ref 6–23)
CO2: 28 meq/L (ref 19–32)
Calcium: 9.1 mg/dL (ref 8.4–10.5)
Chloride: 102 meq/L (ref 96–112)
Creatinine, Ser: 0.79 mg/dL (ref 0.40–1.20)
GFR: 87.09 mL/min (ref >60.00)
Glucose, Bld: 103 mg/dL — ABNORMAL HIGH (ref 70–99)
Potassium: 3.9 meq/L (ref 3.5–5.1)
Sodium: 138 meq/L (ref 135–145)

## 2024-05-12 NOTE — Patient Instructions (Addendum)
 A few things to remember from today's visit:  Class 1 obesity with serious comorbidity and body mass index (BMI) of 31.0 to 31.9 in adult, unspecified obesity type - Plan: Amb Ref to Medical Weight Management  Right-sided thoracic back pain, unspecified chronicity - Plan: DG Chest 2 View  Fatigue, unspecified type - Plan: Basic metabolic panel with GFR, TSH, CBC  Fibromyalgia  Snack on fruit, max 3 small portions. Start low impact exercises a few times per week. You may be able to find fibromyalgia exercises on line.  If you need refills for medications you take chronically, please call your pharmacy. Do not use My Chart to request refills or for acute issues that need immediate attention. If you send a my chart message, it may take a few days to be addressed, specially if I am not in the office.  Please be sure medication list is accurate. If a new problem present, please set up appointment sooner than planned today.

## 2024-05-12 NOTE — Assessment & Plan Note (Addendum)
 Problem is otherwise stable. Right-sided thoracic/CP pain could be related to this problem. Continue Lyrica  300 mg twice daily. Low impact physical activity and good sleep hygiene also recommended. PDMP reviewed. F/U in 6 months.
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
## 2024-05-12 NOTE — Assessment & Plan Note (Signed)
 She understands the benefits of wt loss as well as adverse effects of obesity. Consistency with healthy diet and physical activity encouraged. Referral to healthy weight and wellness clinic placed.

## 2024-05-12 NOTE — Assessment & Plan Note (Signed)
 We discussed possible etiologies, she feels like problem is worse for the past few weeks. Some of her chronic comorbidities as well as medications can be contributing factors. Will obtain CBC, BMP, and TSH today. Following a healthful diet and engaging in regular physical activity may help.

## 2024-05-14 DIAGNOSIS — F4312 Post-traumatic stress disorder, chronic: Secondary | ICD-10-CM | POA: Diagnosis not present

## 2024-05-20 ENCOUNTER — Other Ambulatory Visit

## 2024-05-20 ENCOUNTER — Encounter

## 2024-05-21 DIAGNOSIS — G4700 Insomnia, unspecified: Secondary | ICD-10-CM | POA: Diagnosis not present

## 2024-05-21 DIAGNOSIS — F39 Unspecified mood [affective] disorder: Secondary | ICD-10-CM | POA: Diagnosis not present

## 2024-05-21 DIAGNOSIS — F4312 Post-traumatic stress disorder, chronic: Secondary | ICD-10-CM | POA: Diagnosis not present

## 2024-05-22 DIAGNOSIS — Z780 Asymptomatic menopausal state: Secondary | ICD-10-CM | POA: Diagnosis not present

## 2024-05-22 DIAGNOSIS — Z01419 Encounter for gynecological examination (general) (routine) without abnormal findings: Secondary | ICD-10-CM | POA: Diagnosis not present

## 2024-05-28 DIAGNOSIS — F4312 Post-traumatic stress disorder, chronic: Secondary | ICD-10-CM | POA: Diagnosis not present

## 2024-06-04 DIAGNOSIS — M5127 Other intervertebral disc displacement, lumbosacral region: Secondary | ICD-10-CM | POA: Diagnosis not present

## 2024-06-04 DIAGNOSIS — M5126 Other intervertebral disc displacement, lumbar region: Secondary | ICD-10-CM | POA: Diagnosis not present

## 2024-06-04 DIAGNOSIS — M5117 Intervertebral disc disorders with radiculopathy, lumbosacral region: Secondary | ICD-10-CM | POA: Diagnosis not present

## 2024-06-04 HISTORY — PX: BACK SURGERY: SHX140

## 2024-06-10 ENCOUNTER — Institutional Professional Consult (permissible substitution) (INDEPENDENT_AMBULATORY_CARE_PROVIDER_SITE_OTHER): Admitting: Internal Medicine

## 2024-06-20 DIAGNOSIS — F4312 Post-traumatic stress disorder, chronic: Secondary | ICD-10-CM | POA: Diagnosis not present

## 2024-06-20 DIAGNOSIS — G4700 Insomnia, unspecified: Secondary | ICD-10-CM | POA: Diagnosis not present

## 2024-06-20 DIAGNOSIS — F39 Unspecified mood [affective] disorder: Secondary | ICD-10-CM | POA: Diagnosis not present

## 2024-06-25 DIAGNOSIS — F4312 Post-traumatic stress disorder, chronic: Secondary | ICD-10-CM | POA: Diagnosis not present

## 2024-06-26 ENCOUNTER — Encounter (HOSPITAL_BASED_OUTPATIENT_CLINIC_OR_DEPARTMENT_OTHER): Payer: Self-pay | Admitting: Nurse Practitioner

## 2024-06-26 ENCOUNTER — Ambulatory Visit (HOSPITAL_BASED_OUTPATIENT_CLINIC_OR_DEPARTMENT_OTHER): Admitting: Nurse Practitioner

## 2024-06-26 VITALS — BP 133/87 | HR 74 | Ht 67.0 in | Wt 205.4 lb

## 2024-06-26 DIAGNOSIS — G4733 Obstructive sleep apnea (adult) (pediatric): Secondary | ICD-10-CM

## 2024-06-26 DIAGNOSIS — F17210 Nicotine dependence, cigarettes, uncomplicated: Secondary | ICD-10-CM | POA: Diagnosis not present

## 2024-06-26 DIAGNOSIS — J42 Unspecified chronic bronchitis: Secondary | ICD-10-CM | POA: Diagnosis not present

## 2024-06-26 DIAGNOSIS — G4719 Other hypersomnia: Secondary | ICD-10-CM

## 2024-06-26 DIAGNOSIS — J309 Allergic rhinitis, unspecified: Secondary | ICD-10-CM

## 2024-06-26 DIAGNOSIS — R0683 Snoring: Secondary | ICD-10-CM

## 2024-06-26 DIAGNOSIS — R0609 Other forms of dyspnea: Secondary | ICD-10-CM | POA: Diagnosis not present

## 2024-06-26 MED ORDER — ALBUTEROL SULFATE HFA 108 (90 BASE) MCG/ACT IN AERS: 2.0000 | INHALATION_SPRAY | Freq: Four times a day (QID) | RESPIRATORY_TRACT | 2 refills | Status: AC | PRN

## 2024-06-26 NOTE — Progress Notes (Unsigned)
 @Patient  ID: [name] [name], female    DOB: 1971-10-09, 53 y.o.   MRN: [id number]  Chief Complaint  Patient presents with   Follow-up    Obstructive Sleep apnea    Referring provider: [name], [name], MD  HPI: 53 year old female, active smoker referred for sleep consult. Last seen in 2019 by Dr. Shellia. Past medical history significant for migraine, HTN, GERD, MDD, HLD, fibromyalgia, IDA, HLD, bipolar, obesity.  TEST/EVENTS:  09/13/2018 PFT: FVC 115, FEV1 121, ratio 85, TLC 102, DLCO 109 05/12/2024 CXR: clear lungs  06/26/2024: Today - follow up Discussed the use of AI scribe software for clinical note transcription with the patient, who gave verbal consent to proceed.  History of Present Illness [name] [name] is a 53 year old female with severe sleep apnea who presents with sleep disturbances and respiratory issues.  She has a history of severe sleep apnea and discontinued CPAP therapy approximately two years ago after moving. She experiences waking up gasping and choking, significant snoring, and daytime exhaustion, despite having fibromyalgia. No drowsy driving, falling asleep while driving, or morning headaches. She has not been using any sleep medications and denies a history of sleepwalking. She experienced sleep paralysis last night.  She reports episodes of breathing attacks, characterized by an inability to breathe and panic, occurring while awake for about three years. She describes a sensation of not being able to fully inhale, sometimes accompanied by wheezing. She has a history of smoking, having quit for five to six weeks but recently resumed. Pain in her right lung is associated with coughing or deep breaths, especially when sick. A chest x-ray in June was clear, and a cardiac CT in February 2024 showed no concerning findings. She has used inhalers in the past, including albuterol  and Advair, but not recently. She experiences a tickle-induced cough and wheezing, but  no productive cough. Lung function testing in 2019 was normal, with lung volumes borderline. She does have significant anixety and bipolar disorder. No diagnosed history of asthma. No family history of ILD/IPF. No palpitations, chest pain, syncope, leg swelling.  She consumes decaffeinated coffee and tea, avoids soda and energy drinks, and has not consumed alcohol in years. She is on disability.     Allergies  Allergen Reactions   Aspirin Shortness Of Breath and Other (See Comments)    Angiodema   Bee Venom Hives    Face swelling and chest pain   Diclofenac Anaphylaxis   Naproxen Swelling    Facial swelling   Nsaids Anaphylaxis    Swelling of eyes mouth and throat difficulty breathing   Prednisone     Worsened depression, suicidal ideation    Terbinafine  And Related     Worsened depression, suicidal ideation    Immunization History  Administered Date(s) Administered   Influenza Whole 09/01/2009, 08/07/2012   Influenza, Seasonal, Injecte, Preservative Fre 08/28/2023   Influenza,inj,Quad PF,6+ Mos 09/13/2018   Influenza-Unspecified 09/02/2022   PFIZER(Purple Top)SARS-COV-2 Vaccination 08/07/2020   PNEUMOCOCCAL CONJUGATE-20 05/12/2024   Tdap 11/07/2012    Past Medical History:  Diagnosis Date   Anxiety    Bipolar disorder (HCC)    Chronic kidney disease    kidney infections   Depression    Fibromyalgia    GERD (gastroesophageal reflux disease)    History of cervical dysplasia    History of exercise intolerance    normal ETT 10-24-2016   History of kidney stones    Hyperlipidemia    Hypertension  Migraines    Osteoarthritis    PCOS (polycystic ovarian syndrome)    Pneumonia 2018   Rash    in area of eswl 04-23-2017   Right ureteral calculus    Sleep apnea    Tachycardia cardiologist-  dr harding   controlled w/ metoprolol     Tobacco History: Social History   Tobacco Use  Smoking Status Some Days   Current packs/day: 0.50   Average packs/day: 0.5  packs/day for 25.0 years (12.5 ttl pk-yrs)   Types: Cigarettes  Smokeless Tobacco Never  Tobacco Comments   6-7 cig. daily   Ready to quit: Not Answered Counseling given: Not Answered Tobacco comments: 6-7 cig. daily   Outpatient Medications Prior to Visit  Medication Sig Dispense Refill   acetaminophen  (TYLENOL ) 500 MG tablet Take 1,000 mg by mouth every 6 (six) hours as needed for moderate pain.     B Complex Vitamins (B-COMPLEX/B-12 SL) Place under the tongue.     Cholecalciferol (VITAMIN D ) 50 MCG (2000 UT) tablet Take 2,000 Units by mouth daily.     Cyanocobalamin  (VITAMIN B 12 PO) Take by mouth.     Cyanocobalamin  (VITAMIN B-12 IJ) Inject as directed.     dicyclomine  (BENTYL ) 20 MG tablet Take 1 tablet (20 mg total) by mouth every 6 (six) hours as needed for spasms (abdominal or rectal pain). 120 tablet 5   EPINEPHrine  0.3 mg/0.3 mL IJ SOAJ injection Inject 0.3 mg into the muscle as needed for anaphylaxis. 2 each 0   HYDROcodone -acetaminophen  (NORCO/VICODIN) 5-325 MG tablet Take 1 tablet by mouth every 6 (six) hours as needed.     lamoTRIgine  (LAMICTAL ) 100 MG tablet Take 1 tablet (100 mg total) by mouth daily. 30 tablet 1   linaclotide  (LINZESS ) 290 MCG CAPS capsule Take 1 capsule (290 mcg total) by mouth daily before breakfast. May use prn 30 capsule 11   lithium  carbonate 300 MG capsule Take 300 mg by mouth at bedtime.     losartan  (COZAAR ) 25 MG tablet TAKE 1 TABLET BY MOUTH DAILY 90 tablet 2   methylPREDNISolone  (MEDROL  DOSEPAK) 4 MG TBPK tablet Take 4 mg by mouth as directed.     Multiple Vitamins-Minerals (MULTIVITAMIN WITH MINERALS) tablet Take 1 tablet by mouth at bedtime.     omeprazole  (PRILOSEC) 40 MG capsule Take 1 capsule (40 mg total) by mouth daily. 90 capsule 3   pregabalin  (LYRICA ) 300 MG capsule TAKE 1 CAPSULE BY MOUTH TWICE DAILY 60 capsule 3   propranolol  (INDERAL ) 40 MG tablet Take 1 tablet (40 mg total) by mouth 2 (two) times daily. 180 tablet 3   No  facility-administered medications prior to visit.     Review of Systems: as above, otherwise negative     Physical Exam:  BP 133/87   Pulse 74   Ht 5' 7 (1.702 m)   Wt 205 lb 6.4 oz (93.2 kg)   LMP 04/30/2018   SpO2 98%   BMI 32.17 kg/m   GEN: Pleasant, interactive, well-appearing; obese; in no acute distress HEENT:  Normocephalic and atraumatic. PERRLA. Sclera white. Nasal turbinates pink, moist and patent bilaterally. No rhinorrhea present. Oropharynx pink and moist, without exudate or edema. No lesions, ulcerations, or postnasal drip. Mallampati III NECK:  Supple w/ fair ROM.Thyroid  symmetrical with no goiter or nodules palpated. No lymphadenopathy.   CV: RRR, no m/r/g, no peripheral edema. Pulses intact, +2 bilaterally. No cyanosis, pallor or clubbing. PULMONARY:  Unlabored, regular breathing. Clear bilaterally A&P w/o wheezes/rales/rhonchi. No accessory muscle  use.  GI: BS present and normoactive. Soft, non-tender to palpation. No organomegaly or masses detected.  MSK: No erythema, warmth or tenderness. Cap refil <2 sec all extrem.  Neuro: A/Ox3. No focal deficits noted.   Skin: Warm, no lesions or rashe Psych: Normal affect and behavior. Judgement and thought content appropriate.     Lab Results:  CBC    Component Value Date/Time   WBC 9.5 05/12/2024 1047   RBC 5.01 05/12/2024 1047   HGB 13.9 05/12/2024 1047   HCT 42.1 05/12/2024 1047   PLT 306.0 05/12/2024 1047   PLT 352 09/08/2010 0000   MCV 84.0 05/12/2024 1047   MCH 30.0 10/26/2020 1149   MCHC 32.9 05/12/2024 1047   RDW 13.3 05/12/2024 1047   LYMPHSABS 2.2 12/25/2023 1457   MONOABS 0.7 12/25/2023 1457   EOSABS 0.4 12/25/2023 1457   BASOSABS 0.0 12/25/2023 1457    BMET    Component Value Date/Time   NA 138 05/12/2024 1047   NA 138 12/29/2022 0000   K 3.9 05/12/2024 1047   CL 102 05/12/2024 1047   CO2 28 05/12/2024 1047   GLUCOSE 103 (H) 05/12/2024 1047   GLUCOSE 73 09/08/2010 0000   BUN 12  05/12/2024 1047   BUN 17 12/29/2022 0000   CREATININE 0.79 05/12/2024 1047   CREATININE 0.79 10/26/2020 1149   CALCIUM 9.1 05/12/2024 1047   GFRNONAA 89 10/26/2020 1149   GFRAA 103 10/26/2020 1149    BNP No results found for: BNP   Imaging:  No results found.  Administration History     None          Latest Ref Rng & Units 09/13/2018    1:47 PM  PFT Results  FVC-Pre L 4.37   FVC-Predicted Pre % 115   FVC-Post L 4.37   FVC-Predicted Post % 115   Pre FEV1/FVC % % 84   Post FEV1/FCV % % 85   FEV1-Pre L 3.70   FEV1-Predicted Pre % 120   FEV1-Post L 3.72   DLCO uncorrected ml/min/mmHg 28.42   DLCO UNC% % 109   DLVA Predicted % 98   TLC L 5.36   TLC % Predicted % 102   RV % Predicted % 32     No results found for: NITRICOXIDE      Assessment & Plan:   No problem-specific Assessment & Plan notes found for this encounter. Assessment and Plan Assessment & Plan Obstructive sleep apnea Hx of severe OSA, previously on CPAP. She has snoring, excessive daytime sleepiness, nocturnal apneic events, restless sleep. BMI 32. Given this,  I am concerned she still has sleep disordered breathing with obstructive sleep apnea. She will need a repeat sleep study for further evaluation.  - discussed how weight can impact sleep and risk for sleep disordered breathing - discussed options to assist with weight loss: combination of diet modification, cardiovascular and strength training exercises - had an extensive discussion regarding the adverse health consequences related to untreated sleep disordered breathing - specifically discussed the risks for hypertension, coronary artery disease, cardiac dysrhythmias, cerebrovascular disease, and diabetes - lifestyle modification discussed - discussed how sleep disruption can increase risk of accidents, particularly when driving - safe driving practices were discusse - Order home sleep study - Schedule follow-up in 6-8 weeks to  review sleep study results - Discuss CPAP mask options, including Airtouch N30i and Dreamwear nasal cradle masks - Consider chin strap for mouth breathing if necessary  DOE Possible asthma or reactive airway disease  with peripheral eosinophilia on prior testing (400). Episodes of dyspnea resembling asthma attacks over the past three years, with symptoms of difficulty breathing, wheezing, and chest tightness. No clear triggers identified. Possible this is anxiety driven as well. Smoking history and previous inhaler use noted. Recent chest X-ray 04/2024 and cardiac CT 2024 were clear. Lung function testing in 2019 was normal, but did have some slight air trapping. Recent cardiac workup has been unremarkable. Recent imaging with normal lung parenchyma.  - Prescribe albuterol  inhaler for rescue use during episodes of dyspnea - Order allergy panel to identify potential environmental triggers - Order pulmonary function testing - Schedule follow-up with pulmonary specialist after PFT results - Consider ICS/LABA if albuterol  is effective  Tobacco use disorder Current smoker with a history of quitting for 5-6 weeks. Smoking may contribute to respiratory symptoms and potential development of COPD. - Discuss potential impact of smoking on respiratory health - Counseled for 3 minutes on cessation   Obesity BMI 32. Suspect this contributes to DOE. Aware of correlation between obesity and OSA. Healthy weight loss encouraged    Advised if symptoms do not improve or worsen, to please contact office for sooner follow up or seek emergency care.   I spent 60 minutes of dedicated to the care of this patient on the date of this encounter to include pre-visit review of records, face-to-face time with the patient discussing conditions above, post visit ordering of testing, clinical documentation with the electronic health record, making appropriate referrals as documented, and communicating necessary findings to  members of the patients care team.  Comer LULLA Rouleau, NP 06/26/2024  Pt aware and understands NP's role.

## 2024-06-26 NOTE — Patient Instructions (Addendum)
 Given your symptoms and history, I am concerned that you still have sleep disordered breathing with sleep apnea. You will need a repeat sleep study for further evaluation. Someone will contact you to schedule this.   We discussed how untreated sleep apnea puts an individual at risk for cardiac arrhthymias, pulm HTN, DM, stroke and increases their risk for daytime accidents. We also briefly reviewed treatment options including weight loss, side sleeping position, oral appliance, CPAP therapy or referral to ENT for possible surgical options  Use caution when driving and pull over if you become sleepy.  Albuterol  1-2 puffs every 4-6 hours as needed for shortness of breath or wheezing  Work on quitting smoking  Lung function testing ordered   Follow up in 6-8 weeks with Dr. Neda or Izetta Olimpia Tinch,NP to go over sleep study results and review PFT. If symptoms do not improve or worsen, please contact office for sooner follow up or seek emergency care.

## 2024-06-27 ENCOUNTER — Encounter (HOSPITAL_BASED_OUTPATIENT_CLINIC_OR_DEPARTMENT_OTHER): Payer: Self-pay | Admitting: Nurse Practitioner

## 2024-06-29 LAB — ALLERGEN PANEL (27) + IGE
Alternaria Alternata IgE: 0.87 kU/L — AB
Aspergillus Fumigatus IgE: <0.1 kU/L
Bahia Grass IgE: <0.1 kU/L
Bermuda Grass IgE: <0.1 kU/L
Cat Dander IgE: <0.1 kU/L
Cedar, Mountain IgE: <0.1 kU/L
Cladosporium Herbarum IgE: <0.1 kU/L
Cocklebur IgE: <0.1 kU/L
Cockroach, American IgE: <0.1 kU/L
Common Silver Birch IgE: <0.1 kU/L
D Farinae IgE: <0.1 kU/L
D Pteronyssinus IgE: <0.1 kU/L
Dog Dander IgE: 1.23 kU/L — AB
Elm, American IgE: <0.1 kU/L
Hickory, White IgE: <0.1 kU/L
IgE (Immunoglobulin E), Serum: 36 [IU]/mL (ref 6–495)
Johnson Grass IgE: <0.1 kU/L
Kentucky Bluegrass IgE: <0.1 kU/L
Maple/Box Elder IgE: <0.1 kU/L
Mucor Racemosus IgE: <0.1 kU/L
Oak, White IgE: <0.1 kU/L
Penicillium Chrysogen IgE: <0.1 kU/L
Pigweed, Rough IgE: <0.1 kU/L
Plantain, English IgE: <0.1 kU/L
Ragweed, Short IgE: <0.1 kU/L
Setomelanomma Rostrat: 0.12 kU/L — AB
Timothy Grass IgE: <0.1 kU/L
White Mulberry IgE: <0.1 kU/L

## 2024-07-04 ENCOUNTER — Ambulatory Visit: Payer: Self-pay | Admitting: Nurse Practitioner

## 2024-07-04 DIAGNOSIS — J3081 Allergic rhinitis due to animal (cat) (dog) hair and dander: Secondary | ICD-10-CM

## 2024-07-07 NOTE — Progress Notes (Signed)
 Pt notified and referral placed for allergy as she is around dogs

## 2024-07-09 ENCOUNTER — Encounter

## 2024-07-09 DIAGNOSIS — G4733 Obstructive sleep apnea (adult) (pediatric): Secondary | ICD-10-CM

## 2024-07-09 DIAGNOSIS — R0683 Snoring: Secondary | ICD-10-CM

## 2024-07-09 DIAGNOSIS — G4730 Sleep apnea, unspecified: Secondary | ICD-10-CM | POA: Diagnosis not present

## 2024-07-09 DIAGNOSIS — G4719 Other hypersomnia: Secondary | ICD-10-CM

## 2024-07-16 DIAGNOSIS — F4312 Post-traumatic stress disorder, chronic: Secondary | ICD-10-CM | POA: Diagnosis not present

## 2024-07-23 DIAGNOSIS — F319 Bipolar disorder, unspecified: Secondary | ICD-10-CM | POA: Diagnosis not present

## 2024-07-23 DIAGNOSIS — F411 Generalized anxiety disorder: Secondary | ICD-10-CM | POA: Diagnosis not present

## 2024-07-23 DIAGNOSIS — F4312 Post-traumatic stress disorder, chronic: Secondary | ICD-10-CM | POA: Diagnosis not present

## 2024-07-23 DIAGNOSIS — G4700 Insomnia, unspecified: Secondary | ICD-10-CM | POA: Diagnosis not present

## 2024-07-24 DIAGNOSIS — M4804 Spinal stenosis, thoracic region: Secondary | ICD-10-CM | POA: Diagnosis not present

## 2024-07-24 DIAGNOSIS — M5126 Other intervertebral disc displacement, lumbar region: Secondary | ICD-10-CM | POA: Diagnosis not present

## 2024-07-24 DIAGNOSIS — M546 Pain in thoracic spine: Secondary | ICD-10-CM | POA: Diagnosis not present

## 2024-07-24 DIAGNOSIS — M48061 Spinal stenosis, lumbar region without neurogenic claudication: Secondary | ICD-10-CM | POA: Diagnosis not present

## 2024-07-24 DIAGNOSIS — M47814 Spondylosis without myelopathy or radiculopathy, thoracic region: Secondary | ICD-10-CM | POA: Diagnosis not present

## 2024-07-29 ENCOUNTER — Telehealth (HOSPITAL_BASED_OUTPATIENT_CLINIC_OR_DEPARTMENT_OTHER): Payer: Self-pay

## 2024-07-29 NOTE — Telephone Encounter (Signed)
 Pt notified we do ask for 2-4 weeks for HST review pt was assured that we will reach out once we have results and they have been reviewed by provider.   Copied from CRM [id number]. Topic: Clinical - Lab/Test Results >> Jul 29, 2024  1:31 PM Isabell A wrote: Reason for CRM: Patient would like to know if her sleep study results are back.   Callback number: [phone]

## 2024-07-30 DIAGNOSIS — F4312 Post-traumatic stress disorder, chronic: Secondary | ICD-10-CM | POA: Diagnosis not present

## 2024-07-31 ENCOUNTER — Other Ambulatory Visit: Payer: Self-pay | Admitting: Family Medicine

## 2024-07-31 DIAGNOSIS — M797 Fibromyalgia: Secondary | ICD-10-CM

## 2024-08-01 DIAGNOSIS — R069 Unspecified abnormalities of breathing: Secondary | ICD-10-CM | POA: Diagnosis not present

## 2024-08-12 ENCOUNTER — Ambulatory Visit (INDEPENDENT_AMBULATORY_CARE_PROVIDER_SITE_OTHER)

## 2024-08-12 VITALS — Ht 67.0 in | Wt 205.0 lb

## 2024-08-12 DIAGNOSIS — G4700 Insomnia, unspecified: Secondary | ICD-10-CM | POA: Diagnosis not present

## 2024-08-12 DIAGNOSIS — Z0000 Encounter for general adult medical examination without abnormal findings: Secondary | ICD-10-CM | POA: Diagnosis not present

## 2024-08-12 DIAGNOSIS — F4310 Post-traumatic stress disorder, unspecified: Secondary | ICD-10-CM | POA: Diagnosis not present

## 2024-08-12 DIAGNOSIS — F411 Generalized anxiety disorder: Secondary | ICD-10-CM | POA: Diagnosis not present

## 2024-08-12 DIAGNOSIS — F319 Bipolar disorder, unspecified: Secondary | ICD-10-CM | POA: Diagnosis not present
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
## 2024-08-12 NOTE — Patient Instructions (Signed)
 [name] [name],  Thank you for taking the time for your Medicare Wellness Visit. I appreciate your continued commitment to your health goals. Please review the care plan we discussed, and feel free to reach out if I can assist you further.  Medicare recommends these wellness visits once per year to help you and your care team stay ahead of potential health issues. These visits are designed to focus on prevention, allowing your provider to concentrate on managing your acute and chronic conditions during your regular appointments.  Please note that Annual Wellness Visits do not include a physical exam. Some assessments may be limited, especially if the visit was conducted virtually. If needed, we may recommend a separate in-person follow-up with your provider.  Ongoing Care Seeing your primary care provider every 3 to 6 months helps us  monitor your health and provide consistent, personalized care. Next office visit on 09/02/24.  You are due for a flu vaccine, a Hep b vaccine and a Shingles vaccine.    Referrals If a referral was made during today's visit and you haven't received any updates within two weeks, please contact the referred provider directly to check on the status.  Recommended Screenings:  Health Maintenance  Topic Date Due   Hepatitis B Vaccine (1 of 3 - 19+ 3-dose series) Never done   Zoster (Shingles) Vaccine (1 of 2) Never done   COVID-19 Vaccine (2 - Pfizer risk series) 08/28/2020   Flu Shot  06/20/2024   Medicare Annual Wellness Visit  08/15/2024   DTaP/Tdap/Td vaccine (2 - Td or Tdap) 08/27/2024*   Breast Cancer Screening  04/16/2026   Colon Cancer Screening  05/19/2029   Pneumococcal Vaccine for age over 53  Completed   Hepatitis C Screening  Completed   HIV Screening  Completed   HPV Vaccine  Aged Out   Meningitis B Vaccine  Aged Out  *Topic was postponed. The date shown is not the original due date.       08/12/2024    2:27 PM  Advanced Directives  Does Patient  Have a Medical Advance Directive? No   Advance Care Planning is important because it: Ensures you receive medical care that aligns with your values, goals, and preferences. Provides guidance to your family and loved ones, reducing the emotional burden of decision-making during critical moments.  Vision: Annual vision screenings are recommended for early detection of glaucoma, cataracts, and diabetic retinopathy. These exams can also reveal signs of chronic conditions such as diabetes and high blood pressure.  Dental: Annual dental screenings help detect early signs of oral cancer, gum disease, and other conditions linked to overall health, including heart disease and diabetes.  Please see the attached documents for additional preventive care recommendations.

## 2024-08-15 NOTE — Progress Notes (Signed)
 Severe OSA on HST. Please move up pt's appt to next available. Can be virtual or at any location. Thanks!

## 2024-08-21 ENCOUNTER — Telehealth: Payer: Self-pay

## 2024-08-21 DIAGNOSIS — M5416 Radiculopathy, lumbar region: Secondary | ICD-10-CM | POA: Diagnosis not present

## 2024-08-21 NOTE — Telephone Encounter (Signed)
 Copied from CRM [id number]. Topic: Clinical - Lab/Test Results >> Aug 18, 2024  4:09 PM Rozanna MATSU wrote: Reason for CRM: pt returning nurse call about home sleep stdy results  This was already handled. NFN

## 2024-08-26 DIAGNOSIS — K5900 Constipation, unspecified: Secondary | ICD-10-CM | POA: Diagnosis not present

## 2024-08-26 DIAGNOSIS — Z6832 Body mass index (BMI) 32.0-32.9, adult: Secondary | ICD-10-CM | POA: Diagnosis not present

## 2024-08-26 DIAGNOSIS — I10 Essential (primary) hypertension: Secondary | ICD-10-CM | POA: Diagnosis not present

## 2024-08-26 DIAGNOSIS — R635 Abnormal weight gain: Secondary | ICD-10-CM | POA: Diagnosis not present

## 2024-08-26 DIAGNOSIS — R7309 Other abnormal glucose: Secondary | ICD-10-CM | POA: Diagnosis not present

## 2024-08-26 DIAGNOSIS — M2550 Pain in unspecified joint: Secondary | ICD-10-CM | POA: Diagnosis not present

## 2024-08-26 DIAGNOSIS — F331 Major depressive disorder, recurrent, moderate: Secondary | ICD-10-CM | POA: Diagnosis not present

## 2024-08-26 DIAGNOSIS — R5383 Other fatigue: Secondary | ICD-10-CM | POA: Diagnosis not present

## 2024-08-26 DIAGNOSIS — E559 Vitamin D deficiency, unspecified: Secondary | ICD-10-CM | POA: Diagnosis not present

## 2024-08-27 ENCOUNTER — Encounter (HOSPITAL_COMMUNITY): Payer: Self-pay | Admitting: Neurosurgery

## 2024-08-27 DIAGNOSIS — F4312 Post-traumatic stress disorder, chronic: Secondary | ICD-10-CM | POA: Diagnosis not present

## 2024-08-28 ENCOUNTER — Other Ambulatory Visit (HOSPITAL_COMMUNITY): Payer: Self-pay | Admitting: Neurosurgery

## 2024-08-28 DIAGNOSIS — M546 Pain in thoracic spine: Secondary | ICD-10-CM

## 2024-08-29 ENCOUNTER — Encounter: Admitting: Family Medicine

## 2024-09-01 DIAGNOSIS — M5416 Radiculopathy, lumbar region: Secondary | ICD-10-CM | POA: Diagnosis not present

## 2024-09-02 ENCOUNTER — Encounter: Payer: Self-pay | Admitting: Family Medicine

## 2024-09-02 ENCOUNTER — Ambulatory Visit: Admitting: Nurse Practitioner

## 2024-09-02 ENCOUNTER — Encounter: Payer: Self-pay | Admitting: Nurse Practitioner

## 2024-09-02 ENCOUNTER — Ambulatory Visit: Payer: Self-pay | Admitting: Family Medicine

## 2024-09-02 ENCOUNTER — Ambulatory Visit (INDEPENDENT_AMBULATORY_CARE_PROVIDER_SITE_OTHER): Admitting: Family Medicine

## 2024-09-02 ENCOUNTER — Telehealth: Payer: Self-pay

## 2024-09-02 VITALS — BP 130/84 | HR 88 | Temp 97.9°F | Resp 16 | Ht 67.0 in | Wt 215.8 lb

## 2024-09-02 VITALS — BP 128/76 | HR 87 | Temp 98.1°F | Resp 18 | Ht 67.0 in | Wt 215.8 lb

## 2024-09-02 DIAGNOSIS — R509 Fever, unspecified: Secondary | ICD-10-CM | POA: Diagnosis not present

## 2024-09-02 DIAGNOSIS — G4719 Other hypersomnia: Secondary | ICD-10-CM

## 2024-09-02 DIAGNOSIS — R0609 Other forms of dyspnea: Secondary | ICD-10-CM

## 2024-09-02 DIAGNOSIS — Z23 Encounter for immunization: Secondary | ICD-10-CM | POA: Diagnosis not present

## 2024-09-02 DIAGNOSIS — J309 Allergic rhinitis, unspecified: Secondary | ICD-10-CM

## 2024-09-02 DIAGNOSIS — Z0000 Encounter for general adult medical examination without abnormal findings: Secondary | ICD-10-CM | POA: Diagnosis not present

## 2024-09-02 DIAGNOSIS — R0683 Snoring: Secondary | ICD-10-CM

## 2024-09-02 DIAGNOSIS — E538 Deficiency of other specified B group vitamins: Secondary | ICD-10-CM

## 2024-09-02 DIAGNOSIS — E785 Hyperlipidemia, unspecified: Secondary | ICD-10-CM | POA: Diagnosis not present

## 2024-09-02 DIAGNOSIS — G4733 Obstructive sleep apnea (adult) (pediatric): Secondary | ICD-10-CM | POA: Diagnosis not present

## 2024-09-02 DIAGNOSIS — E559 Vitamin D deficiency, unspecified: Secondary | ICD-10-CM

## 2024-09-02 DIAGNOSIS — R7309 Other abnormal glucose: Secondary | ICD-10-CM

## 2024-09-02 DIAGNOSIS — I10 Essential (primary) hypertension: Secondary | ICD-10-CM

## 2024-09-02 DIAGNOSIS — M797 Fibromyalgia: Secondary | ICD-10-CM | POA: Diagnosis not present

## 2024-09-02 DIAGNOSIS — R000 Tachycardia, unspecified: Secondary | ICD-10-CM

## 2024-09-02 DIAGNOSIS — D72829 Elevated white blood cell count, unspecified: Secondary | ICD-10-CM

## 2024-09-02 DIAGNOSIS — K219 Gastro-esophageal reflux disease without esophagitis: Secondary | ICD-10-CM | POA: Diagnosis not present

## 2024-09-02 LAB — CBC WITH DIFFERENTIAL/PLATELET
Basophils Absolute: 0 K/uL (ref 0.0–0.1)
Basophils Relative: 0.2 % (ref 0.0–3.0)
Eosinophils Absolute: 0 K/uL (ref 0.0–0.7)
Eosinophils Relative: 0 % (ref 0.0–5.0)
HCT: 39.6 % (ref 36.0–46.0)
Hemoglobin: 12.9 g/dL (ref 12.0–15.0)
Lymphocytes Relative: 7.6 % — ABNORMAL LOW (ref 12.0–46.0)
Lymphs Abs: 1.9 K/uL (ref 0.7–4.0)
MCHC: 32.5 g/dL (ref 30.0–36.0)
MCV: 82.4 fl (ref 78.0–100.0)
Monocytes Absolute: 1.4 K/uL — ABNORMAL HIGH (ref 0.1–1.0)
Monocytes Relative: 5.6 % (ref 3.0–12.0)
Neutro Abs: 21.4 K/uL — ABNORMAL HIGH (ref 1.4–7.7)
Neutrophils Relative %: 86.6 % — ABNORMAL HIGH (ref 43.0–77.0)
Platelets: 367 K/uL (ref 150.0–400.0)
RBC: 4.8 Mil/uL (ref 3.87–5.11)
RDW: 14.2 % (ref 11.5–15.5)
WBC: 24.7 K/uL (ref 4.0–10.5)

## 2024-09-02 LAB — COMPREHENSIVE METABOLIC PANEL WITH GFR
ALT: 18 U/L (ref 0–35)
AST: 12 U/L (ref 0–37)
Albumin: 4.1 g/dL (ref 3.5–5.2)
Alkaline Phosphatase: 59 U/L (ref 39–117)
BUN: 14 mg/dL (ref 6–23)
CO2: 25 meq/L (ref 19–32)
Calcium: 9.2 mg/dL (ref 8.4–10.5)
Chloride: 104 meq/L (ref 96–112)
Creatinine, Ser: 0.73 mg/dL (ref 0.40–1.20)
GFR: 95.54 mL/min (ref >60.00)
Glucose, Bld: 137 mg/dL — ABNORMAL HIGH (ref 70–99)
Potassium: 4.1 meq/L (ref 3.5–5.1)
Sodium: 136 meq/L (ref 135–145)
Total Bilirubin: 0.2 mg/dL (ref 0.2–1.2)
Total Protein: 6.7 g/dL (ref 6.0–8.3)

## 2024-09-02 LAB — LIPID PANEL
Cholesterol: 199 mg/dL (ref 0–200)
HDL: 55.3 mg/dL (ref >39.00)
LDL Cholesterol: 123 mg/dL — ABNORMAL HIGH (ref 0–99)
NonHDL: 143.83
Total CHOL/HDL Ratio: 4
Triglycerides: 105 mg/dL (ref 0.0–149.0)
VLDL: 21 mg/dL (ref 0.0–40.0)

## 2024-09-02 LAB — TSH: TSH: 0.59 u[IU]/mL (ref 0.35–5.50)

## 2024-09-02 LAB — HEMOGLOBIN A1C: Hgb A1c MFr Bld: 6.1 % (ref 4.6–6.5)

## 2024-09-02 LAB — VITAMIN B12: Vitamin B-12: 356 pg/mL (ref 211–911)

## 2024-09-02 LAB — VITAMIN D 25 HYDROXY (VIT D DEFICIENCY, FRACTURES): VITD: 17.34 ng/mL — ABNORMAL LOW (ref 30.00–100.00)

## 2024-09-02 LAB — C-REACTIVE PROTEIN: CRP: <0.5 mg/dL (ref 0.5–20.0)

## 2024-09-02 MED ORDER — METOPROLOL SUCCINATE ER 50 MG PO TB24: 50.0000 mg | ORAL_TABLET | Freq: Every day | ORAL | 1 refills | Status: AC

## 2024-09-02 MED ORDER — OMEPRAZOLE 40 MG PO CPDR: 40.0000 mg | DELAYED_RELEASE_CAPSULE | Freq: Every day | ORAL | 3 refills | Status: DC

## 2024-09-02 MED ORDER — FLUTICASONE PROPIONATE 50 MCG/ACT NA SUSP: 2.0000 | Freq: Every day | NASAL | 2 refills | Status: AC

## 2024-09-02 MED ORDER — LEVOCETIRIZINE DIHYDROCHLORIDE 5 MG PO TABS: 5.0000 mg | ORAL_TABLET | Freq: Every evening | ORAL | 5 refills | Status: AC

## 2024-09-02 NOTE — Assessment & Plan Note (Signed)
 Continue current dose of vitamin D supplementation. Further recommendation will be given according to 25 OH vitamin D result.

## 2024-09-02 NOTE — Telephone Encounter (Signed)
 Called patient to verify that she received information about results and plan, she did and she is planning on coming back Friday for CBC. She was clearly instructed about warning signs. Mykeisha Dysert [name], MD

## 2024-09-02 NOTE — Assessment & Plan Note (Addendum)
 Reporting elevated BPs at home, which certainly can be caused by lack of sleep, pain, and OSA. Recommend stopping propranolol  40 mg and starting metoprolol  succinate 50 mg daily we discussed son side effects of the medication, instructed to monitor BP and HR daily for the next few days. Continue losartan  25 mg daily and low-salt diet. Follow-up in 3 months, before if needed.

## 2024-09-02 NOTE — Assessment & Plan Note (Signed)
 Currently on nonpharmacologic treatment. Further recommendation will be given according to lipid panel result.
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
## 2024-09-02 NOTE — Assessment & Plan Note (Signed)
 Continue current dose of vitamin B12 supplementation. Further recommendation will be given according to B12 result.

## 2024-09-02 NOTE — Progress Notes (Signed)
 @Patient  ID: [name] [name] Clarity, female    DOB: 13-Nov-1971, 53 y.o.   MRN: [id number]  Chief Complaint  Patient presents with   Follow-up    Sleep    Referring provider: [name], [name], MD  HPI: 53 year old female, active smoker followed for severe OSA and DOE. Last seen 06/26/2024 by Pallie Swigert NP. Past medical history significant for migraine, HTN, GERD, MDD, HLD, fibromyalgia, IDA, HLD, bipolar, obesity.  TEST/EVENTS:  09/13/2018 PFT: FVC 115, FEV1 121, ratio 85, TLC 102, DLCO 109 05/12/2024 CXR: clear lungs  06/26/2024: OV with Maxxon Schwanke NP [name] [name] Idy Rawling is a 53 year old female with severe sleep apnea who presents with sleep disturbances and respiratory issues. She has a history of severe sleep apnea and discontinued CPAP therapy approximately two years ago after moving. She experiences waking up gasping and choking, significant snoring, and daytime exhaustion, despite having fibromyalgia. No drowsy driving, falling asleep while driving, or morning headaches. She has not been using any sleep medications and denies a history of sleepwalking. She experienced sleep paralysis last night. She reports episodes of breathing attacks, characterized by an inability to breathe and panic, occurring while awake for about three years. She describes a sensation of not being able to fully inhale, sometimes accompanied by wheezing. She has a history of smoking, having quit for five to six weeks but recently resumed. Pain in her right lung is associated with coughing or deep breaths, especially when sick. A chest x-ray in June was clear, and a cardiac CT in February 2024 showed no concerning findings. She has used inhalers in the past, including albuterol  and Advair, but not recently. She experiences a tickle-induced cough and wheezing, but no productive cough. Lung function testing in 2019 was normal, with lung volumes borderline. She does have significant anixety and bipolar disorder. No diagnosed history of  asthma. No family history of ILD/IPF. No palpitations, chest pain, syncope, leg swelling. She consumes decaffeinated coffee and tea, avoids soda and energy drinks, and has not consumed alcohol in years. She is on disability.   09/02/2024: Today - follow up Discussed the use of AI scribe software for clinical note transcription with the patient, who gave verbal consent to proceed.  History of Present Illness  [name] [name] is a 53 year old female with severe sleep apnea who presents for follow up after home sleep study.   She experiences significant fatigue, waking up gasping for air, and shortness of breath during the day. Her CPAP machine is eight to 51 years old, and she has not been using it because she needs new supplies and is unsure of it's status. No drowsy driving, sleep parasomnias.   She has been experiencing symptoms related to allergies, including coughing and itchy eyes, which she attributes to living with her mother's dog. She has been diagnosed with allergies to mold and dog dander. She has not yet started allergy medication. No headaches, ear pain, facial tenderness, sore throat. She does have an associated cough, related to the postnasal drainage. Primarily dry but sometimes with clear phlegm.   She has a history of smoking but has quit for almost a month. She occasionally uses albuterol , about twice a week, which has helped with her breathing, especially during episodes of panic attacks or when she was sick recently. She is scheduled for lung function testing soon. No wheezing, leg swelling, chest congestion, fevers, hemoptysis.     Allergies  Allergen Reactions   Aspirin Shortness Of Breath  and Other (See Comments)    Angiodema   Bee Venom Hives    Face swelling and chest pain   Diclofenac Anaphylaxis   Naproxen Swelling    Facial swelling   Nsaids Anaphylaxis    Swelling of eyes mouth and throat difficulty breathing   Prednisone     Worsened depression,  suicidal ideation    Terbinafine  And Related     Worsened depression, suicidal ideation    Immunization History  Administered Date(s) Administered   Influenza Whole 09/01/2009, 08/07/2012   Influenza, Seasonal, Injecte, Preservative Fre 08/28/2023, 09/02/2024   Influenza,inj,Quad PF,6+ Mos 09/13/2018   Influenza-Unspecified 09/02/2022   PFIZER(Purple Top)SARS-COV-2 Vaccination 08/07/2020   PNEUMOCOCCAL CONJUGATE-20 05/12/2024   Tdap 11/07/2012    Past Medical History:  Diagnosis Date   Anxiety    Bipolar disorder (HCC)    Chronic kidney disease    kidney infections   Depression    Fibromyalgia    GERD (gastroesophageal reflux disease)    History of cervical dysplasia    History of exercise intolerance    normal ETT 10-24-2016   History of kidney stones    Hyperlipidemia    Hypertension    Migraines    Osteoarthritis    PCOS (polycystic ovarian syndrome)    Pneumonia 2018   Rash    in area of eswl 04-23-2017   Right ureteral calculus    Sleep apnea    Tachycardia cardiologist-  dr harding   controlled w/ metoprolol     Tobacco History: Social History   Tobacco Use  Smoking Status Some Days   Current packs/day: 0.50   Average packs/day: 0.5 packs/day for 25.0 years (12.5 ttl pk-yrs)   Types: Cigarettes  Smokeless Tobacco Never  Tobacco Comments   6-7 cig. daily   Ready to quit: Not Answered Counseling given: Not Answered Tobacco comments: 6-7 cig. daily   Outpatient Medications Prior to Visit  Medication Sig Dispense Refill   acetaminophen  (TYLENOL ) 500 MG tablet Take 1,000 mg by mouth every 6 (six) hours as needed for moderate pain.     albuterol  (VENTOLIN  HFA) 108 (90 Base) MCG/ACT inhaler Inhale 2 puffs into the lungs every 6 (six) hours as needed for wheezing or shortness of breath. 8 g 2   B Complex Vitamins (B-COMPLEX/B-12 SL) Place under the tongue.     Cholecalciferol (VITAMIN D ) 50 MCG (2000 UT) tablet Take 2,000 Units by mouth daily.      Cyanocobalamin  (VITAMIN B 12 PO) Take by mouth.     Cyanocobalamin  (VITAMIN B-12 IJ) Inject as directed.     dicyclomine  (BENTYL ) 20 MG tablet Take 1 tablet (20 mg total) by mouth every 6 (six) hours as needed for spasms (abdominal or rectal pain). 120 tablet 5   EPINEPHrine  0.3 mg/0.3 mL IJ SOAJ injection Inject 0.3 mg into the muscle as needed for anaphylaxis. 2 each 0   lamoTRIgine  (LAMICTAL ) 100 MG tablet Take 1 tablet (100 mg total) by mouth daily. (Patient taking differently: Take 150 mg by mouth daily.) 30 tablet 1   linaclotide  (LINZESS ) 290 MCG CAPS capsule Take 1 capsule (290 mcg total) by mouth daily before breakfast. May use prn 30 capsule 11   losartan  (COZAAR ) 25 MG tablet TAKE 1 TABLET BY MOUTH DAILY 90 tablet 2   metoprolol  succinate (TOPROL -XL) 50 MG 24 hr tablet Take 1 tablet (50 mg total) by mouth daily. Take with or immediately following a meal. 90 tablet 1   omeprazole  (PRILOSEC) 40 MG capsule Take 1  capsule (40 mg total) by mouth daily before breakfast. 90 capsule 3   pregabalin  (LYRICA ) 300 MG capsule TAKE 1 CAPSULE BY MOUTH TWICE DAILY 60 capsule 3   No facility-administered medications prior to visit.     Review of Systems: as above    Physical Exam:  BP 128/76 (BP Location: Left Arm, Patient Position: Sitting, Cuff Size: Normal)   Pulse 87   Temp 98.1 F (36.7 C) (Oral)   Resp 18   Ht 5' 7 (1.702 m)   Wt 215 lb 12.8 oz (97.9 kg)   LMP 04/30/2018   SpO2 98%   BMI 33.80 kg/m   GEN: Pleasant, interactive, well-appearing; obese; in no acute distress HEENT:  Normocephalic and atraumatic. PERRLA. Sclera white. Nasal turbinates pink, moist and patent bilaterally. No rhinorrhea present. Oropharynx pink and moist, without exudate or edema. No lesions, ulcerations, or postnasal drip. Mallampati III NECK:  Supple w/ fair ROM.Thyroid  symmetrical with no goiter or nodules palpated. No lymphadenopathy.   CV: RRR, no m/r/g, no peripheral edema. Pulses intact, +2  bilaterally. No cyanosis, pallor or clubbing. PULMONARY:  Unlabored, regular breathing. Clear bilaterally A&P w/o wheezes/rales/rhonchi. No accessory muscle use.  GI: BS present and normoactive. Soft, non-tender to palpation.  MSK: No erythema, warmth or tenderness. Cap refil <2 sec all extrem.  Neuro: A/Ox3. No focal deficits noted.   Skin: Warm, no lesions or rashe Psych: Normal affect and behavior. Judgement and thought content appropriate.     Lab Results:  CBC    Component Value Date/Time   WBC 9.5 05/12/2024 1047   RBC 5.01 05/12/2024 1047   HGB 13.9 05/12/2024 1047   HCT 42.1 05/12/2024 1047   PLT 306.0 05/12/2024 1047   PLT 352 09/08/2010 0000   MCV 84.0 05/12/2024 1047   MCH 30.0 10/26/2020 1149   MCHC 32.9 05/12/2024 1047   RDW 13.3 05/12/2024 1047   LYMPHSABS 2.2 12/25/2023 1457   MONOABS 0.7 12/25/2023 1457   EOSABS 0.4 12/25/2023 1457   BASOSABS 0.0 12/25/2023 1457    BMET    Component Value Date/Time   NA 138 05/12/2024 1047   NA 138 12/29/2022 0000   K 3.9 05/12/2024 1047   CL 102 05/12/2024 1047   CO2 28 05/12/2024 1047   GLUCOSE 103 (H) 05/12/2024 1047   GLUCOSE 73 09/08/2010 0000   BUN 12 05/12/2024 1047   BUN 17 12/29/2022 0000   CREATININE 0.79 05/12/2024 1047   CREATININE 0.79 10/26/2020 1149   CALCIUM 9.1 05/12/2024 1047   GFRNONAA 89 10/26/2020 1149   GFRAA 103 10/26/2020 1149    BNP No results found for: BNP   Imaging:  No results found.  Administration History     None          Latest Ref Rng & Units 09/13/2018    1:47 PM  PFT Results  FVC-Pre L 4.37   FVC-Predicted Pre % 115   FVC-Post L 4.37   FVC-Predicted Post % 115   Pre FEV1/FVC % % 84   Post FEV1/FCV % % 85   FEV1-Pre L 3.70   FEV1-Predicted Pre % 120   FEV1-Post L 3.72   DLCO uncorrected ml/min/mmHg 28.42   DLCO UNC% % 109   DLVA Predicted % 98   TLC L 5.36   TLC % Predicted % 102   RV % Predicted % 32     No results found for:  NITRICOXIDE      Assessment & Plan:   No problem-specific  Assessment & Plan notes found for this encounter. Assessment and Plan Assessment & Plan Obstructive sleep apnea with daytime fatigue and hypersomnia Severe obstructive sleep apnea confirmed by sleep study, contributing to significant daytime fatigue, hypersomnia, weight gain. Current CPAP machine is outdated and not in use. Reviewed risks of untreated severe OSA and potential treatment options. Recommendation to resume CPAP therapy given severity. Pt agreeable. Orders placed for urgent new CPAP auto 5-15 cmH2O, nasal mask, and heated humidity. Educated on proper use/care of device. Risks/benefits reviewed. Safe driving practices reviewed. Healthy weight loss encouraged  - Order new CPAP machine and supplies - Set CPAP pressure settings to 5-15 cm H2O - Instruct to report if CPAP pressure feels inadequate or excessive - Consider in-lab sleep study if difficulties arise with CPAP therapy - Schedule follow-up to adjust settings on old CPAP machine after new machine is obtained  Allergic rhinitis due to dog and mold exposure Allergic rhinitis due to exposure to dog dander and mold, with symptoms of coughing and itchy eyes. Lives with a dog, which may necessitate allergy shots in the future. Currently not on allergy medication but willing to start. - Prescribe Xyzal (levocetirizine) 1 tab daily - Prescribe Flonase nasal spray, 1-2 sprays each nostril daily - Advise to reschedule allergy referral   Cough and dyspnea Intermittent cough and dyspnea, possibly exacerbated by allergic rhinitis. Albuterol  used a few times a week with positive effect. Lung function testing is scheduled for 10/17 to assess for obstructive lung disease given smoking hx. There is also concern for underlying asthma as driving factor given constellation of symptoms and allergies. Suspect cough is primarily related to postnasal drainage - Continue PRN albuterol   -  Instruct not to use albuterol  on the day of lung function testing - Consider controller therapy based on lung function test results and symptoms  - Encouraged to work on graded exercises - Counseled to remain smoke free   Nicotine  dependence, currently quit Has quit smoking for nearly a month, which is a great achievement. Smoking cessation is important for overall health and may improve respiratory symptoms. - Encourage continued smoking cessation efforts    Advised if symptoms do not improve or worsen, to please contact office for sooner follow up or seek emergency care.   I spent 35 minutes of dedicated to the care of this patient on the date of this encounter to include pre-visit review of records, face-to-face time with the patient discussing conditions above, post visit ordering of testing, clinical documentation with the electronic health record, making appropriate referrals as documented, and communicating necessary findings to members of the patients care team.  Comer LULLA Rouleau, NP 09/02/2024  Pt aware and understands NP's role.

## 2024-09-02 NOTE — Telephone Encounter (Signed)
 CRITICAL VALUE STICKER  CRITICAL VALUE: WBC 24.7  RECEIVER (on-site recipient of call): Collie Hind, BSN, RN  DATE & TIME NOTIFIED:  09/02/2024 1530  MESSENGER (representative from lab): [facility]  MD NOTIFIED: Dr. [name]  TIME OF NOTIFICATION: 09/02/2024 1532  RESPONSE: Aware

## 2024-09-02 NOTE — Telephone Encounter (Signed)
 See result note, already gave recommendations. BJ

## 2024-09-02 NOTE — Patient Instructions (Addendum)
 A few things to remember from today's visit:  Routine general medical examination at a health care facility  Essential hypertension - Plan: TSH, metoprolol  succinate (TOPROL -XL) 50 MG 24 hr tablet  Hyperlipidemia, unspecified hyperlipidemia type - Plan: Comprehensive metabolic panel with GFR, Lipid panel  Fibromyalgia - Plan: Ambulatory referral to Pain Clinic  Elevated glucose level - Plan: Hemoglobin A1c  B12 deficiency - Plan: Vitamin B12  Vitamin D  deficiency, unspecified - Plan: VITAMIN D  25 Hydroxy (Vit-D Deficiency, Fractures)  Gastroesophageal reflux disease, unspecified whether esophagitis present - Plan: omeprazole  (PRILOSEC) 40 MG capsule  Need for influenza vaccination - Plan: Flu vaccine, recombinant, trivalent, inj  Fever, unspecified - Plan: CBC with Differential/Platelet, C-reactive protein  Stop Propranolol  and start Metoprolol  5 mg daily. Monitor blood pressure and pulse daily.  If you need refills for medications you take chronically, please call your pharmacy. Do not use My Chart to request refills or for acute issues that need immediate attention. If you send a my chart message, it may take a few days to be addressed, specially if I am not in the office.  Please be sure medication list is accurate. If a new problem present, please set up appointment sooner than planned today.  Health Maintenance, Female Adopting a healthy lifestyle and getting preventive care are important in promoting health and wellness. Ask your health care provider about: The right schedule for you to have regular tests and exams. Things you can do on your own to prevent diseases and keep yourself healthy. What should I know about diet, weight, and exercise? Eat a healthy diet  Eat a diet that includes plenty of vegetables, fruits, low-fat dairy products, and lean protein. Do not eat a lot of foods that are high in solid fats, added sugars, or sodium. Maintain a healthy weight Body  mass index (BMI) is used to identify weight problems. It estimates body fat based on height and weight. Your health care provider can help determine your BMI and help you achieve or maintain a healthy weight. Get regular exercise Get regular exercise. This is one of the most important things you can do for your health. Most adults should: Exercise for at least 150 minutes each week. The exercise should increase your heart rate and make you sweat (moderate-intensity exercise). Do strengthening exercises at least twice a week. This is in addition to the moderate-intensity exercise. Spend less time sitting. Even light physical activity can be beneficial. Watch cholesterol and blood lipids Have your blood tested for lipids and cholesterol at 51 years of age, then have this test every 5 years. Have your cholesterol levels checked more often if: Your lipid or cholesterol levels are high. You are older than 51 years of age. You are at high risk for heart disease. What should I know about cancer screening? Depending on your health history and family history, you may need to have cancer screening at various ages. This may include screening for: Breast cancer. Cervical cancer. Colorectal cancer. Skin cancer. Lung cancer. What should I know about heart disease, diabetes, and high blood pressure? Blood pressure and heart disease High blood pressure causes heart disease and increases the risk of stroke. This is more likely to develop in people who have high blood pressure readings or are overweight. Have your blood pressure checked: Every 3-5 years if you are 60-27 years of age. Every year if you are 83 years old or older. Diabetes Have regular diabetes screenings. This checks your fasting blood sugar level. Have  the screening done: Once every three years after age 104 if you are at a normal weight and have a low risk for diabetes. More often and at a younger age if you are overweight or have a high  risk for diabetes. What should I know about preventing infection? Hepatitis B If you have a higher risk for hepatitis B, you should be screened for this virus. Talk with your health care provider to find out if you are at risk for hepatitis B infection. Hepatitis C Testing is recommended for: Everyone born from [birth year] through 1963. Anyone with known risk factors for hepatitis C. Sexually transmitted infections (STIs) Get screened for STIs, including gonorrhea and chlamydia, if: You are sexually active and are younger than 51 years of age. You are older than 51 years of age and your health care provider tells you that you are at risk for this type of infection. Your sexual activity has changed since you were last screened, and you are at increased risk for chlamydia or gonorrhea. Ask your health care provider if you are at risk. Ask your health care provider about whether you are at high risk for HIV. Your health care provider may recommend a prescription medicine to help prevent HIV infection. If you choose to take medicine to prevent HIV, you should first get tested for HIV. You should then be tested every 3 months for as long as you are taking the medicine. Pregnancy If you are about to stop having your period (premenopausal) and you may become pregnant, seek counseling before you get pregnant. Take 400 to 800 micrograms (mcg) of folic acid every day if you become pregnant. Ask for birth control (contraception) if you want to prevent pregnancy. Osteoporosis and menopause Osteoporosis is a disease in which the bones lose minerals and strength with aging. This can result in bone fractures. If you are 12 years old or older, or if you are at risk for osteoporosis and fractures, ask your health care provider if you should: Be screened for bone loss. Take a calcium or vitamin D  supplement to lower your risk of fractures. Be given hormone replacement therapy (HRT) to treat symptoms of  menopause. Follow these instructions at home: Alcohol use Do not drink alcohol if: Your health care provider tells you not to drink. You are pregnant, may be pregnant, or are planning to become pregnant. If you drink alcohol: Limit how much you have to: 0-1 drink a day. Know how much alcohol is in your drink. In the [place] one drink equals one 12 oz bottle of beer (355 mL), one 5 oz glass of wine (148 mL), or one 1 oz glass of hard liquor (44 mL). Lifestyle Do not use any products that contain nicotine  or tobacco. These products include cigarettes, chewing tobacco, and vaping devices, such as e-cigarettes. If you need help quitting, ask your health care provider. Do not use street drugs. Do not share needles. Ask your health care provider for help if you need support or information about quitting drugs. General instructions Schedule regular health, dental, and eye exams. Stay current with your vaccines. Tell your health care provider if: You often feel depressed. You have ever been abused or do not feel safe at home. Summary Adopting a healthy lifestyle and getting preventive care are important in promoting health and wellness. Follow your health care provider's instructions about healthy diet, exercising, and getting tested or screened for diseases. Follow your health care provider's instructions on monitoring your cholesterol and blood  pressure. This information is not intended to replace advice given to you by your health care provider. Make sure you discuss any questions you have with your health care provider. Document Revised: 03/28/2021 Document Reviewed: 03/28/2021 Elsevier Patient Education  2024 [employer].

## 2024-09-02 NOTE — Assessment & Plan Note (Signed)
 We discussed the importance of regular physical activity and healthy diet for prevention of chronic illness and/or complications. Preventive guidelines reviewed. Vaccination: Recommend getting Shingrix and Tdap at her pharmacy.  Influenza vaccine given today. Continue her female preventive care with her gynecologist. Next CPE in a year.

## 2024-09-02 NOTE — Assessment & Plan Note (Signed)
Problem is well controlled. Continue omeprazole 40 mg daily and GERD precautions.

## 2024-09-02 NOTE — Patient Instructions (Addendum)
 Start CPAP 5-15 cmH2O, every night, minimum of 4-6 hours a night.  Change equipment as directed. Wash your tubing with warm soap and water  daily, hang to dry. Wash humidifier portion weekly. Use bottled, distilled water  and change daily Be aware of reduced alertness and do not drive or operate heavy machinery if experiencing this or drowsiness.  Exercise encouraged, as tolerated. Healthy weight management discussed.  Avoid or decrease alcohol consumption and medications that make you more sleepy, if possible. Notify if persistent daytime sleepiness occurs even with consistent use of PAP therapy.  Change CPAP supplies... Every month Mask cushions and/or nasal pillows CPAP machine filters Every 3 months Mask frame (not including the headgear) CPAP tubing Every 6 months Mask headgear Chin strap (if applicable) Humidifier water  tub    We discussed how untreated sleep apnea puts an individual at risk for cardiac arrhthymias, pulm HTN, DM, stroke and increases their risk for daytime accidents. We also briefly reviewed treatment options including weight loss, side sleeping position, oral appliance, CPAP therapy or referral to ENT for possible surgical options   Continue Albuterol  1-2 puffs every 4-6 hours as needed for shortness of breath or wheezing  Xyzal (levocetirizine) 1 tab daily  Flonase nasal spray 1-2 sprays each nostril daily for allergies  Attend on lung function testing    Follow up in 6-8 weeks with Dr. Neda or Katie Jacqualyn Sedgwick,NP. If symptoms do not improve or worsen, please contact office for sooner follow up or seek emergency care.

## 2024-09-02 NOTE — Assessment & Plan Note (Addendum)
 Pain is gradually getting worse, currently she is on Lyrica  300 mg twice daily. She would like to establish with pain management, referral placed. Encouraged low impact physical activity and good sleep hygiene.

## 2024-09-03 ENCOUNTER — Ambulatory Visit (HOSPITAL_COMMUNITY)
Admission: RE | Admit: 2024-09-03 | Discharge: 2024-09-03 | Disposition: A | Discharge status: Home / Self Care | Source: Ambulatory Visit | Attending: Neurosurgery | Admitting: Neurosurgery

## 2024-09-03 DIAGNOSIS — K5730 Diverticulosis of large intestine without perforation or abscess without bleeding: Secondary | ICD-10-CM | POA: Diagnosis not present

## 2024-09-03 DIAGNOSIS — M47815 Spondylosis without myelopathy or radiculopathy, thoracolumbar region: Secondary | ICD-10-CM | POA: Diagnosis not present

## 2024-09-03 DIAGNOSIS — M546 Pain in thoracic spine: Secondary | ICD-10-CM | POA: Insufficient documentation

## 2024-09-03 DIAGNOSIS — R0781 Pleurodynia: Secondary | ICD-10-CM | POA: Insufficient documentation

## 2024-09-03 DIAGNOSIS — N200 Calculus of kidney: Secondary | ICD-10-CM | POA: Diagnosis not present

## 2024-09-03 DIAGNOSIS — Z1289 Encounter for screening for malignant neoplasm of other sites: Secondary | ICD-10-CM | POA: Insufficient documentation

## 2024-09-03 DIAGNOSIS — N281 Cyst of kidney, acquired: Secondary | ICD-10-CM | POA: Diagnosis not present

## 2024-09-03 MED ORDER — IOHEXOL 300 MG/ML  SOLN
100.0000 mL | Freq: Once | INTRAMUSCULAR | Status: AC | PRN
  Administered 2024-09-03: 100 mL via INTRAVENOUS

## 2024-09-05 ENCOUNTER — Encounter

## 2024-09-05 ENCOUNTER — Other Ambulatory Visit (INDEPENDENT_AMBULATORY_CARE_PROVIDER_SITE_OTHER)

## 2024-09-05 DIAGNOSIS — D72829 Elevated white blood cell count, unspecified: Secondary | ICD-10-CM | POA: Diagnosis not present

## 2024-09-05 LAB — CBC WITH DIFFERENTIAL/PLATELET
Basophils Absolute: 0.1 K/uL (ref 0.0–0.1)
Basophils Relative: 0.4 % (ref 0.0–3.0)
Eosinophils Absolute: 0.8 K/uL — ABNORMAL HIGH (ref 0.0–0.7)
Eosinophils Relative: 5.5 % — ABNORMAL HIGH (ref 0.0–5.0)
HCT: 40.1 % (ref 36.0–46.0)
Hemoglobin: 12.9 g/dL (ref 12.0–15.0)
Lymphocytes Relative: 18.4 % (ref 12.0–46.0)
Lymphs Abs: 2.7 K/uL (ref 0.7–4.0)
MCHC: 32.2 g/dL (ref 30.0–36.0)
MCV: 82.8 fl (ref 78.0–100.0)
Monocytes Absolute: 1.1 K/uL — ABNORMAL HIGH (ref 0.1–1.0)
Monocytes Relative: 7.5 % (ref 3.0–12.0)
Neutro Abs: 9.9 K/uL — ABNORMAL HIGH (ref 1.4–7.7)
Neutrophils Relative %: 68.2 % (ref 43.0–77.0)
Platelets: 340 K/uL (ref 150.0–400.0)
RBC: 4.84 Mil/uL (ref 3.87–5.11)
RDW: 14.6 % (ref 11.5–15.5)
WBC: 14.4 K/uL — ABNORMAL HIGH (ref 4.0–10.5)

## 2024-09-10 DIAGNOSIS — F4312 Post-traumatic stress disorder, chronic: Secondary | ICD-10-CM | POA: Diagnosis not present

## 2024-09-12 ENCOUNTER — Ambulatory Visit: Payer: Self-pay

## 2024-09-12 NOTE — Telephone Encounter (Signed)
 FYI Only or Action Required?: FYI only for provider.  Patient was last seen in primary care on 09/02/2024 by [name], [name], MD.  Called Nurse Triage reporting Nausea.  Symptoms began several months ago.  Interventions attempted: Nothing.  Symptoms are: unchanged.  Triage Disposition: See PCP Within 2 Weeks  Patient/caregiver understands and will follow disposition?:  Yes       Copied from CRM [id number]. Topic: Clinical - Red Word Triage >> Sep 12, 2024  2:42 PM Tinnie BROCKS wrote: Red Word that prompted transfer to Nurse Triage: Worsening general feeling of illness she's had since July, increasing aching all over, low grade fever off and on, headaches, nausea and vomiting. Was seen 10/17 for physical but needs appt to just discuss this. Reason for Disposition  Muscle aches are a chronic symptom (recurrent or ongoing AND present > 4 weeks)  Answer Assessment - Initial Assessment Questions Pt states that she was seen 10/17 and this was discussed but she knows something is wrong. Just feels ill, like the flu. It used to be a couple of days a week but now its constant. She states that she thinks its something to do with her back surgery, stated maybe something happened to her spine to cause this.  Feels like additional blood work needs done.  Body pain, aches, nausea vomited the other night, headaches,      1. ONSET: When did the muscle aches or body pains start?      Has fibromyalgia but worse sine July 16th.  2. LOCATION: What part of your body is hurting? (e.g., entire body, arms, legs)      All over even arms and hands which are newer areas for her   5. FEVER: Do you have a fever? If Yes, ask: What is your temperature, how was it measured, and  when did it start?      Low grad but has not been measureing 6. OTHER SYMPTOMS: Do you have any other symptoms? (e.g., chest pain, cold or flu symptoms, rash, weakness, weight loss)     Body aches, nausea, headaches, vomiting  a couple of days ago  Protocols used: Muscle Aches and Body Pain-A-AH

## 2024-09-16 ENCOUNTER — Ambulatory Visit: Payer: Self-pay | Admitting: Family Medicine

## 2024-09-16 ENCOUNTER — Ambulatory Visit: Admitting: Family Medicine

## 2024-09-16 ENCOUNTER — Encounter: Payer: Self-pay | Admitting: Family Medicine

## 2024-09-16 VITALS — BP 128/72 | HR 60 | Temp 97.7°F | Resp 16 | Ht 67.0 in | Wt 219.2 lb

## 2024-09-16 DIAGNOSIS — G894 Chronic pain syndrome: Secondary | ICD-10-CM

## 2024-09-16 DIAGNOSIS — K219 Gastro-esophageal reflux disease without esophagitis: Secondary | ICD-10-CM | POA: Diagnosis not present

## 2024-09-16 DIAGNOSIS — R112 Nausea with vomiting, unspecified: Secondary | ICD-10-CM

## 2024-09-16 DIAGNOSIS — N393 Stress incontinence (female) (male): Secondary | ICD-10-CM

## 2024-09-16 DIAGNOSIS — K625 Hemorrhage of anus and rectum: Secondary | ICD-10-CM | POA: Diagnosis not present

## 2024-09-16 DIAGNOSIS — R519 Headache, unspecified: Secondary | ICD-10-CM | POA: Diagnosis not present

## 2024-09-16 LAB — CBC
HCT: 39.1 % (ref 36.0–46.0)
Hemoglobin: 12.8 g/dL (ref 12.0–15.0)
MCHC: 32.7 g/dL (ref 30.0–36.0)
MCV: 81.7 fl (ref 78.0–100.0)
Platelets: 366 K/uL (ref 150.0–400.0)
RBC: 4.79 Mil/uL (ref 3.87–5.11)
RDW: 14 % (ref 11.5–15.5)
WBC: 9.5 K/uL (ref 4.0–10.5)

## 2024-09-16 MED ORDER — PANTOPRAZOLE SODIUM 40 MG PO TBEC: 40.0000 mg | DELAYED_RELEASE_TABLET | Freq: Every day | ORAL | 0 refills | Status: DC

## 2024-09-16 NOTE — Patient Instructions (Addendum)
 A few things to remember from today's visit:  Rectal bleeding - Plan: CBC, Ambulatory referral to Gastroenterology  Headache, unspecified headache type - Plan: CT HEAD WO CONTRAST ( )  Stress incontinence of urine  Chronic pain disorder  Nausea and vomiting in adult  Gastroesophageal reflux disease, unspecified whether esophagitis present - Plan: pantoprazole  (PROTONIX ) 40 MG tablet  Stop Omeprazole . Start Pantoprazole  15-30 min before breakfast. This is the phone number for pain management. Appointment Scheduling:   We recognize that you are likely looking forward to scheduling your appointment.  Your referral has been sent to the appropriate office for scheduling. Usually, it takes 3-5 business days to process referrals once they are received.  Please allow this time for the office handling your referral to contact you directly to arrange your appointment.  If you have not been contacted within 5 business days, please call [phone] to make an appointment.  Monitor for new symptoms. Appt with gastroenterologist will be arranged.  Please arrange appt with your gynecologist for possible prolapse.  If you need refills for medications you take chronically, please call your pharmacy. Do not use My Chart to request refills or for acute issues that need immediate attention. If you send a my chart message, it may take a few days to be addressed, specially if I am not in the office.  Please be sure medication list is accurate. If a new problem present, please set up appointment sooner than planned today.
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
## 2024-09-17 ENCOUNTER — Telehealth: Payer: Self-pay

## 2024-09-17 NOTE — Telephone Encounter (Signed)
 CMN received from [facility],faxed successfully

## 2024-09-18 ENCOUNTER — Telehealth: Payer: Self-pay | Admitting: Nurse Practitioner

## 2024-09-18 ENCOUNTER — Encounter: Payer: Self-pay | Admitting: Family Medicine

## 2024-09-18 NOTE — Telephone Encounter (Signed)
 I called and spoke to pt. Pt states she last saw Izetta Rouleau, NP and an urgent CPAP order was to be placed to [facility]. Pt states she is aware that [facility] has received it and when she spoke to [facility], she was told that they did not know what was going on with the order and somebody else would be in touch with her to further look into this. Pt states she never received that call back from [facility] and called our office to see if there is anything our office can do to figure out what the issue is. Routing to the [facility] to look into this.

## 2024-09-18 NOTE — Telephone Encounter (Signed)
 Per Arvella at [facility] they they have been unable to reach pt to schedule. I spoke with pt and provided number as well as location to contact. Pt verbalized understanding. NFN

## 2024-09-18 NOTE — Telephone Encounter (Signed)
 Copied from CRM #[id number]. Topic: Clinical - Order For Equipment >> Sep 18, 2024 11:50 AM Rilla NOVAK wrote: Reason for CRM: Patient calling, states she called on yesterday and no one called her back. States she has severe sleep apnea and was told to call back if she had not heard/received anything in 2 weeks.  Patient called [facility] on yesterday and they state they don't know what's going on.  Patient would like a call back from office to [phone].

## 2024-09-18 NOTE — Telephone Encounter (Signed)
 I have sent an urgent message to [facility] in regards to this

## 2024-09-25 DIAGNOSIS — F4312 Post-traumatic stress disorder, chronic: Secondary | ICD-10-CM | POA: Diagnosis not present

## 2024-09-29 ENCOUNTER — Other Ambulatory Visit: Payer: Self-pay | Admitting: Medical Genetics

## 2024-09-29 DIAGNOSIS — Z006 Encounter for examination for normal comparison and control in clinical research program: Secondary | ICD-10-CM

## 2024-09-29 DIAGNOSIS — F411 Generalized anxiety disorder: Secondary | ICD-10-CM | POA: Diagnosis not present

## 2024-09-29 DIAGNOSIS — F314 Bipolar disorder, current episode depressed, severe, without psychotic features: Secondary | ICD-10-CM | POA: Diagnosis not present

## 2024-09-29 DIAGNOSIS — F4310 Post-traumatic stress disorder, unspecified: Secondary | ICD-10-CM | POA: Diagnosis not present

## 2024-10-02 ENCOUNTER — Ambulatory Visit: Admitting: Nurse Practitioner

## 2024-10-07 ENCOUNTER — Encounter: Payer: Self-pay | Admitting: Physical Medicine and Rehabilitation

## 2024-10-13 ENCOUNTER — Ambulatory Visit: Payer: Self-pay

## 2024-10-13 NOTE — Telephone Encounter (Signed)
 FYI Only or Action Required?: FYI only for provider: appointment scheduled on 10/14/24.  Patient was last seen in primary care on 09/16/2024 by Jordan, [name], MD.  Called Nurse Triage reporting Nasal Congestion and Cough.  Symptoms began 1-2 weeks ago.  Interventions attempted: OTC medications: Vicks VapoRub, Tylenol  and generic cold/flu syrup.  Symptoms are: unchanged.  Triage Disposition: See Physician Within 24 Hours  Patient/caregiver understands and will follow disposition?: Yes                               1. ONSET: When did the cough begin?      1-2 weeks ago 2. SEVERITY: How bad is the cough today?      Moderate to severe, frequent coughing spells while on phone with this RN 3. SPUTUM: Describe the color of your sputum (e.g., none, dry cough; clear, white, yellow, green)     Fluctuates between dark brown and clear 4. HEMOPTYSIS: Are you coughing up any blood? If Yes, ask: How much? (e.g., flecks, streaks, tablespoons, etc.)     Denies 5. DIFFICULTY BREATHING: Are you having difficulty breathing? If Yes, ask: How bad is it? (e.g., mild, moderate, severe)      Denies 6. FEVER: Do you have a fever? If Yes, ask: What is your temperature, how was it measured, and when did it start?     Denies 7. CARDIAC HISTORY: Do you have any history of heart disease? (e.g., heart attack, congestive heart failure)      Denies 8. LUNG HISTORY: Do you have any history of lung disease?  (e.g., pulmonary embolus, asthma, emphysema)     Sleep apnea 9. PE RISK FACTORS: Do you have a history of blood clots? (or: recent major surgery, recent prolonged travel, bedridden)     Denies blood clots, recent travel to [place] for a few days 10. OTHER SYMPTOMS: Do you have any other symptoms? (e.g., runny nose, wheezing, chest pain)     Headache, fatigue, nasal congestion, bilateral earache, chest congestion, chest soreness related to coughing- soreness when  breathing in, denies wheezing, denies facial redness/swelling  Copied from CRM #[id number]. Topic: Clinical - Red Word Triage >> Oct 13, 2024  2:18 PM Drema MATSU wrote: [facility] that prompted transfer to Nurse Triage: Patient stated that she doesn't feel well.She is having problems with sinus pressure. Symptoms started last Sunday and got worse on Friday. She said that her mucous is thick dark yellow and green  Reason for Disposition  [1] Continuous (nonstop) coughing interferes with work or school AND [2] no improvement using cough treatment per Care Advice  Protocols used: Cough - Acute Productive-A-AH

## 2024-10-14 ENCOUNTER — Ambulatory Visit (INDEPENDENT_AMBULATORY_CARE_PROVIDER_SITE_OTHER)

## 2024-10-14 ENCOUNTER — Encounter: Payer: Self-pay | Admitting: Family Medicine

## 2024-10-14 ENCOUNTER — Ambulatory Visit: Admitting: Family Medicine

## 2024-10-14 VITALS — BP 122/80 | HR 74 | Temp 97.9°F | Resp 16 | Ht 67.0 in | Wt 222.4 lb

## 2024-10-14 DIAGNOSIS — J309 Allergic rhinitis, unspecified: Secondary | ICD-10-CM

## 2024-10-14 DIAGNOSIS — R051 Acute cough: Secondary | ICD-10-CM

## 2024-10-14 DIAGNOSIS — J209 Acute bronchitis, unspecified: Secondary | ICD-10-CM

## 2024-10-14 DIAGNOSIS — R059 Cough, unspecified: Secondary | ICD-10-CM | POA: Diagnosis not present

## 2024-10-14 MED ORDER — BENZONATATE 100 MG PO CAPS: 100.0000 mg | ORAL_CAPSULE | Freq: Two times a day (BID) | ORAL | 0 refills | Status: AC | PRN

## 2024-10-14 MED ORDER — BUDESONIDE-FORMOTEROL FUMARATE 160-4.5 MCG/ACT IN AERO: 2.0000 | INHALATION_SPRAY | Freq: Two times a day (BID) | RESPIRATORY_TRACT | 0 refills | Status: AC
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
## 2024-10-14 NOTE — Patient Instructions (Addendum)
 A few things to remember from today's visit:  Acute bronchitis, unspecified organism - Plan: budesonide -formoterol  (SYMBICORT ) 160-4.5 MCG/ACT inhaler  Acute cough - Plan: DG Chest 2 View, benzonatate  (TESSALON ) 100 MG capsule  Plain mucinex . Symbicort  2 puff am and pm, rinse after use. I do not think you need antibiotic at this time. I sent prescription in case symptoms get worse or you do not feel any better in 5-7 days. Start Flonase  nasal spray at bedtime for 10-14 days then as needed. Nasal saline irrigations.  If you need refills for medications you take chronically, please call your pharmacy. Do not use My Chart to request refills or for acute issues that need immediate attention. If you send a my chart message, it may take a few days to be addressed, specially if I am not in the office.  Please be sure medication list is accurate. If a new problem present, please set up appointment sooner than planned today.

## 2024-10-14 NOTE — Assessment & Plan Note (Signed)
 This problem could be contributing to her cough. Recommend resuming Flonase  nasal spray and use it at bedtime daily for 10 to 14 days and then as needed. Nasal saline irrigations throughout the day. Continue Xyzal  5 mg daily.

## 2024-10-17 MED ORDER — DOXYCYCLINE HYCLATE 100 MG PO TABS: 100.0000 mg | ORAL_TABLET | Freq: Two times a day (BID) | ORAL | 0 refills | Status: AC

## 2024-10-20 ENCOUNTER — Ambulatory Visit: Payer: Self-pay | Admitting: Family Medicine

## 2024-10-28 ENCOUNTER — Ambulatory Visit: Admitting: Nurse Practitioner

## 2024-11-05 ENCOUNTER — Ambulatory Visit: Payer: Self-pay

## 2024-11-05 NOTE — Telephone Encounter (Signed)
 FYI Only or Action Required?: FYI only for provider: appointment scheduled on 11/06/24 at 1120.  Patient was last seen in primary care on 10/14/2024 by Jordan, [name], MD.  Called Nurse Triage reporting underarm sores with drainage x 2 weeks.  Symptoms began 2 weeks ago.  Interventions attempted: woundcare cream.  Symptoms are: gradually worsening.  Triage Disposition: See HCP Within 4 Hours (Or PCP Triage)  Patient/caregiver understands and will follow disposition?: yes, pcp appt scheduled for tomorrow.    Copied from CRM #[id number]. Topic: Clinical - Red Word Triage >> Nov 05, 2024  4:56 PM Ashley R wrote: Red Word that prompted transfer to Nurse Triage: Pain in underarm, lymph node area, hole in underarm, with drainage. Recently got bigger and pain in groin as well. Reason for Disposition  [1] Looks infected (e.g., spreading redness, pus) AND [2] large red area (> 2 inches or 5 cm)  Answer Assessment - Initial Assessment Questions 1. APPEARANCE of SORES: What do the sores look like?    Pt reports small hole the size of an end of a q tip to R underarm that has since grown in size, reports smaller hole to L underarm, clear light blood draining from L arm, yellow booger like drainage from R arm  2. NUMBER: How many sores are there? 2      3. SIZE: How big is the largest sore?     Bigger than the end of a q tip per patient  4. LOCATION: Where are the sores located?    [place] underarms  5. ONSET: When did the sores begin?     2 weeks ago  6. TENDER: Does it hurt when you touch it?  (Scale 1-10; or mild, moderate, severe)   Yes, achy, 2/10  7. CAUSE: What do you think is causing the sores?     Pt unsure, reports history of boils  8. OTHER SYMPTOMS: Do you have any other symptoms? (e.g., fever, new weakness) denies      Has been using wound care cream, peroxide  Protocols used: Sores-A-AH

## 2024-11-06 ENCOUNTER — Ambulatory Visit: Admitting: Family Medicine

## 2024-11-06 ENCOUNTER — Telehealth: Payer: Self-pay | Admitting: *Deleted

## 2024-11-06 NOTE — Telephone Encounter (Unsigned)
 Copied from CRM #[id number]. Topic: Referral - Prior Authorization Question >> Nov 05, 2024 10:42 AM Ismael A wrote: Reason for CRM: Ammeia from [facility] calling regarding Allergen Test that was performed for pt on 06/26/24 stating it was denied by [facility], she is wanting to know if there are any other diagnosis codes they can use to resubmit the claim

## 2024-11-07 NOTE — Telephone Encounter (Signed)
 Added J30.9 - allergic rhinitis.

## 2024-11-19 ENCOUNTER — Encounter: Admitting: Physical Medicine and Rehabilitation

## 2024-12-11 ENCOUNTER — Other Ambulatory Visit: Payer: Self-pay | Admitting: Family Medicine

## 2024-12-11 DIAGNOSIS — K219 Gastro-esophageal reflux disease without esophagitis: Secondary | ICD-10-CM

## 2024-12-11 DIAGNOSIS — I10 Essential (primary) hypertension: Secondary | ICD-10-CM

## 2024-12-22 ENCOUNTER — Encounter: Admitting: Physical Medicine and Rehabilitation

## 2025-02-23 ENCOUNTER — Encounter: Admitting: Physical Medicine and Rehabilitation
